# Patient Record
Sex: Male | Born: 1975 | Race: White | Hispanic: No | Marital: Married | Smoking: Former smoker
Health system: Southern US, Community
[De-identification: ages and names within clinical notes are randomized; demographics above are authoritative.]

## PROBLEM LIST (undated history)

## (undated) DIAGNOSIS — I5022 Chronic systolic (congestive) heart failure: Secondary | ICD-10-CM

## (undated) DIAGNOSIS — E785 Hyperlipidemia, unspecified: Secondary | ICD-10-CM

## (undated) DIAGNOSIS — F32A Depression, unspecified: Secondary | ICD-10-CM

## (undated) DIAGNOSIS — I509 Heart failure, unspecified: Secondary | ICD-10-CM

## (undated) DIAGNOSIS — I4891 Unspecified atrial fibrillation: Secondary | ICD-10-CM

## (undated) DIAGNOSIS — M109 Gout, unspecified: Secondary | ICD-10-CM

## (undated) DIAGNOSIS — I499 Cardiac arrhythmia, unspecified: Secondary | ICD-10-CM

## (undated) DIAGNOSIS — F419 Anxiety disorder, unspecified: Secondary | ICD-10-CM

## (undated) DIAGNOSIS — I10 Essential (primary) hypertension: Secondary | ICD-10-CM

## (undated) DIAGNOSIS — R7303 Prediabetes: Secondary | ICD-10-CM

## (undated) HISTORY — PX: SEPTOPLASTY: SUR1290

## (undated) HISTORY — DX: Essential (primary) hypertension: I10

## (undated) HISTORY — PX: CARDIAC CATHETERIZATION: SHX172

## (undated) HISTORY — PX: OTHER SURGICAL HISTORY: SHX169

## (undated) HISTORY — DX: Hyperlipidemia, unspecified: E78.5

---

## 2020-01-31 ENCOUNTER — Other Ambulatory Visit: Payer: Self-pay | Admitting: General Surgery

## 2020-01-31 ENCOUNTER — Other Ambulatory Visit (HOSPITAL_COMMUNITY): Payer: Self-pay | Admitting: General Surgery

## 2020-02-03 ENCOUNTER — Other Ambulatory Visit (HOSPITAL_COMMUNITY): Payer: Self-pay | Admitting: General Surgery

## 2020-02-19 ENCOUNTER — Other Ambulatory Visit: Payer: Self-pay

## 2020-02-19 ENCOUNTER — Ambulatory Visit (HOSPITAL_COMMUNITY)
Admission: RE | Admit: 2020-02-19 | Discharge: 2020-02-19 | Disposition: A | Discharge status: Home / Self Care | Payer: BC Managed Care – PPO | Source: Ambulatory Visit | Attending: General Surgery | Admitting: General Surgery

## 2020-02-24 ENCOUNTER — Other Ambulatory Visit: Payer: Self-pay

## 2020-02-24 ENCOUNTER — Encounter: Payer: Self-pay | Admitting: Skilled Nursing Facility1

## 2020-02-24 ENCOUNTER — Encounter: Payer: BC Managed Care – PPO | Attending: General Surgery | Admitting: Skilled Nursing Facility1

## 2020-02-24 DIAGNOSIS — E669 Obesity, unspecified: Secondary | ICD-10-CM | POA: Diagnosis not present

## 2020-02-24 NOTE — Progress Notes (Signed)
Nutrition Assessment for Bariatric Surgery Medical Nutrition Therapy Appt Start Time: 11:05 End Time: 12:05  Patient was seen on 02/24/2020 for Pre-Operative Nutrition Assessment. Letter of approval faxed to [facility] bariatric surgery program coordinator on 02/24/2020  Referral stated Supervised Weight Loss (SWL) visits needed: 0  Planned surgery: Sleeve Gastrectomy  Pt expectation of surgery: to control blood pressure  Pt expectation of dietitian: to help guide   Dietitian Clearance: Pt seems appropriately prepared for surgery understanding the change neccessary to be successful post surgery   NUTRITION ASSESSMENT   Anthropometrics  Start weight at [facility]: 316 lbs (date: 02/24/2020)  Height: 73 in BMI: 41.76 kg/m2     Clinical  Medical hx: hypertension  Medications: see list Labs:  Notable signs/symptoms: N/A Any previous deficiencies? No  Micronutrient Nutrition Focused Physical Exam: Hair: No issues observed Eyes: No issues observed Mouth: No issues observed Neck: No issues observed Nails: No issues observed Skin: No issues observed  Lifestyle & Dietary Hx  Pt states he does not really care about what weight number he gets too he really cares more about getting his blood pressure under control recognizing how important health is especially compared to weight. Pt states a mass was found in his stomach so his blood pressure has been up due to worry stating he checks his blood pressure daily. Pt states he has never noticed any quicker to be full feeling but does have some relfux.  Pt states he knows he is a stress eater.  Pt states he has been doing assisted stretching which he feels has helped.  Pt states he does have a therapist he works with.  Pt states he sleeps okay but thinks maybe he has apnea.   24-Hr Dietary Recall First Meal: skipped or fast food Snack: granola bar or grapes  Second Meal: sandwich and chips or leftover fast food Snack:  chips Third Meal: pasta or pizza Snack: candy Beverages: coffee, beer, diet soda, water   Estimated Energy Needs Calories: 1800   NUTRITION DIAGNOSIS  Overweight/obesity ([place]-3.3) related to past poor dietary habits and physical inactivity as evidenced by patient w/ planned sleeve gastrectomy surgery following dietary guidelines for continued weight loss.    NUTRITION INTERVENTION  Nutrition counseling (C-1) and education (E-2) to facilitate bariatric surgery goals.   Pre-Op Goals Reviewed with the Patient  Track food and beverage intake (pen and paper, MyFitness Pal, Baritastic app, etc.)  Make healthy food choices while monitoring portion sizes  Consume 3 meals per day or try to eat every 3-5 hours  Avoid concentrated sugars and fried foods  Keep sugar & fat in the single digits per serving on food labels  Practice CHEWING your food (aim for applesauce consistency)  Practice not drinking 15 minutes before, during, and 30 minutes after each meal and snack  Avoid all carbonated beverages (ex: soda, sparkling beverages)   Limit caffeinated beverages (ex: coffee, tea, energy drinks)  Avoid all sugar-sweetened beverages (ex: regular soda, sports drinks)   Avoid alcohol   Aim for 64-100 ounces of FLUID daily (with at least half of fluid intake being plain water)   Aim for at least 60-80 grams of PROTEIN daily  Look for a liquid protein source that contains ?15 g protein and ?5 g carbohydrate (ex: shakes, drinks, shots)  Make a list of non-food related activities  Physical activity is an important part of a healthy lifestyle so keep it moving! The goal is to reach 150 minutes of exercise per week,  including cardiovascular and weight baring activity.  *Goals that are bolded indicate the pt would like to start working towards these  Handouts Provided Include   Bariatric Surgery handouts (Nutrition Visits, Pre-Op Goals, Protein Shakes, Vitamins & Minerals)  Learning  Style & Readiness for Change Teaching method utilized: Visual & Auditory  Demonstrated degree of understanding via: Teach Back  Barriers to learning/adherence to lifestyle change: emotional eater      MONITORING & EVALUATION Dietary intake, weekly physical activity, body weight, and pre-op goals reached at next nutrition visit.    Next Steps  Patient is to follow up at [facility] for Pre-Op Class >2 weeks before surgery for further nutrition education.

## 2020-02-25 ENCOUNTER — Other Ambulatory Visit: Payer: Self-pay | Admitting: General Surgery

## 2020-03-10 ENCOUNTER — Ambulatory Visit
Admission: RE | Admit: 2020-03-10 | Discharge: 2020-03-10 | Disposition: A | Discharge status: Home / Self Care | Payer: BC Managed Care – PPO | Source: Ambulatory Visit | Attending: General Surgery | Admitting: General Surgery

## 2020-03-10 ENCOUNTER — Other Ambulatory Visit: Payer: Self-pay

## 2020-03-10 MED ORDER — IOPAMIDOL (ISOVUE-300) INJECTION 61%
100.0000 mL | Freq: Once | INTRAVENOUS | Status: AC | PRN
  Administered 2020-03-10: 100 mL via INTRAVENOUS

## 2020-04-25 ENCOUNTER — Other Ambulatory Visit: Payer: Self-pay

## 2020-04-25 ENCOUNTER — Emergency Department (HOSPITAL_COMMUNITY): Payer: BC Managed Care – PPO

## 2020-04-25 ENCOUNTER — Observation Stay (HOSPITAL_COMMUNITY)
Admission: EM | Admit: 2020-04-25 | Discharge: 2020-04-27 | Disposition: A | Discharge status: Home / Self Care | Payer: BC Managed Care – PPO | Attending: Family Medicine | Admitting: Family Medicine

## 2020-04-25 ENCOUNTER — Encounter (HOSPITAL_COMMUNITY): Payer: Self-pay | Admitting: Emergency Medicine

## 2020-04-25 DIAGNOSIS — I10 Essential (primary) hypertension: Secondary | ICD-10-CM | POA: Diagnosis not present

## 2020-04-25 DIAGNOSIS — M545 Low back pain, unspecified: Secondary | ICD-10-CM

## 2020-04-25 DIAGNOSIS — I4891 Unspecified atrial fibrillation: Secondary | ICD-10-CM | POA: Diagnosis present

## 2020-04-25 DIAGNOSIS — Z20822 Contact with and (suspected) exposure to covid-19: Secondary | ICD-10-CM | POA: Insufficient documentation

## 2020-04-25 DIAGNOSIS — F32A Depression, unspecified: Secondary | ICD-10-CM

## 2020-04-25 DIAGNOSIS — E785 Hyperlipidemia, unspecified: Secondary | ICD-10-CM

## 2020-04-25 DIAGNOSIS — F410 Panic disorder [episodic paroxysmal anxiety] without agoraphobia: Secondary | ICD-10-CM

## 2020-04-25 DIAGNOSIS — M109 Gout, unspecified: Secondary | ICD-10-CM

## 2020-04-25 DIAGNOSIS — R079 Chest pain, unspecified: Secondary | ICD-10-CM

## 2020-04-25 HISTORY — DX: Unspecified atrial fibrillation: I48.91

## 2020-04-25 HISTORY — DX: Gout, unspecified: M10.9

## 2020-04-25 LAB — CBC
HCT: 46.6 % (ref 39.0–52.0)
Hemoglobin: 16.5 g/dL (ref 13.0–17.0)
MCH: 29.8 pg (ref 26.0–34.0)
MCHC: 35.4 g/dL (ref 30.0–36.0)
MCV: 84.1 fL (ref 80.0–100.0)
Platelets: 293 10*3/uL (ref 150–400)
RBC: 5.54 MIL/uL (ref 4.22–5.81)
RDW: 12.4 % (ref 11.5–15.5)
WBC: 10.6 10*3/uL — ABNORMAL HIGH (ref 4.0–10.5)
nRBC: 0 % (ref 0.0–0.2)

## 2020-04-25 LAB — BASIC METABOLIC PANEL
Anion gap: 12 (ref 5–15)
BUN: 15 mg/dL (ref 6–20)
CO2: 24 mmol/L (ref 22–32)
Calcium: 9.5 mg/dL (ref 8.9–10.3)
Chloride: 102 mmol/L (ref 98–111)
Creatinine, Ser: 1.18 mg/dL (ref 0.61–1.24)
GFR, Estimated: >60 mL/min (ref >60)
Glucose, Bld: 129 mg/dL — ABNORMAL HIGH (ref 70–99)
Potassium: 3.5 mmol/L (ref 3.5–5.1)
Sodium: 138 mmol/L (ref 135–145)

## 2020-04-25 LAB — TROPONIN I (HIGH SENSITIVITY)
Troponin I (High Sensitivity): 68 ng/L — ABNORMAL HIGH (ref <18)
Troponin I (High Sensitivity): 69 ng/L — ABNORMAL HIGH (ref <18)

## 2020-04-25 NOTE — ED Triage Notes (Signed)
Pt reports intermittent SOB x 2 weeks.  Seen at [facility] last night and diagnosed with AFIB.  States he left AMA and didn't want to be admitted.  Denies chest pain at present but has had chest pain.

## 2020-04-26 ENCOUNTER — Emergency Department (HOSPITAL_COMMUNITY): Payer: BC Managed Care – PPO

## 2020-04-26 ENCOUNTER — Observation Stay (HOSPITAL_BASED_OUTPATIENT_CLINIC_OR_DEPARTMENT_OTHER): Payer: BC Managed Care – PPO

## 2020-04-26 ENCOUNTER — Encounter (HOSPITAL_COMMUNITY): Payer: Self-pay | Admitting: Internal Medicine

## 2020-04-26 DIAGNOSIS — F410 Panic disorder [episodic paroxysmal anxiety] without agoraphobia: Secondary | ICD-10-CM

## 2020-04-26 DIAGNOSIS — I4891 Unspecified atrial fibrillation: Secondary | ICD-10-CM

## 2020-04-26 DIAGNOSIS — I5021 Acute systolic (congestive) heart failure: Secondary | ICD-10-CM | POA: Diagnosis not present

## 2020-04-26 DIAGNOSIS — E785 Hyperlipidemia, unspecified: Secondary | ICD-10-CM

## 2020-04-26 DIAGNOSIS — M109 Gout, unspecified: Secondary | ICD-10-CM

## 2020-04-26 DIAGNOSIS — I10 Essential (primary) hypertension: Secondary | ICD-10-CM | POA: Diagnosis not present

## 2020-04-26 DIAGNOSIS — F32A Depression, unspecified: Secondary | ICD-10-CM

## 2020-04-26 DIAGNOSIS — M545 Low back pain, unspecified: Secondary | ICD-10-CM

## 2020-04-26 LAB — ECHOCARDIOGRAM COMPLETE
Area-P 1/2: 5.31 cm2
Calc EF: 37.5 %
Height: 73 in
S' Lateral: 4.2 cm
Single Plane A2C EF: 26.2 %
Single Plane A4C EF: 39.9 %
Weight: 5040 oz

## 2020-04-26 LAB — RESP PANEL BY RT-PCR (FLU A&B, COVID) ARPGX2
Influenza A by PCR: NEGATIVE
Influenza B by PCR: NEGATIVE
SARS Coronavirus 2 by RT PCR: NEGATIVE

## 2020-04-26 LAB — CBC
HCT: 44.7 % (ref 39.0–52.0)
Hemoglobin: 15 g/dL (ref 13.0–17.0)
MCH: 29 pg (ref 26.0–34.0)
MCHC: 33.6 g/dL (ref 30.0–36.0)
MCV: 86.3 fL (ref 80.0–100.0)
Platelets: 249 10*3/uL (ref 150–400)
RBC: 5.18 MIL/uL (ref 4.22–5.81)
RDW: 12.4 % (ref 11.5–15.5)
WBC: 8.6 10*3/uL (ref 4.0–10.5)
nRBC: 0 % (ref 0.0–0.2)

## 2020-04-26 LAB — BASIC METABOLIC PANEL
Anion gap: 11 (ref 5–15)
BUN: 12 mg/dL (ref 6–20)
CO2: 23 mmol/L (ref 22–32)
Calcium: 8.7 mg/dL — ABNORMAL LOW (ref 8.9–10.3)
Chloride: 103 mmol/L (ref 98–111)
Creatinine, Ser: 1.07 mg/dL (ref 0.61–1.24)
GFR, Estimated: >60 mL/min (ref >60)
Glucose, Bld: 180 mg/dL — ABNORMAL HIGH (ref 70–99)
Potassium: 3.2 mmol/L — ABNORMAL LOW (ref 3.5–5.1)
Sodium: 137 mmol/L (ref 135–145)

## 2020-04-26 LAB — TSH: TSH: 3.48 u[IU]/mL (ref 0.350–4.500)

## 2020-04-26 LAB — HIV ANTIBODY (ROUTINE TESTING W REFLEX): HIV Screen 4th Generation wRfx: NONREACTIVE

## 2020-04-26 LAB — MAGNESIUM: Magnesium: 1.9 mg/dL (ref 1.7–2.4)

## 2020-04-26 MED ORDER — LOSARTAN POTASSIUM 50 MG PO TABS
100.0000 mg | ORAL_TABLET | Freq: Every day | ORAL | Status: DC
  Administered 2020-04-27: 100 mg via ORAL
  Filled 2020-04-26: qty 2

## 2020-04-26 MED ORDER — TRAMADOL HCL 50 MG PO TABS: 50.0000 mg | ORAL_TABLET | Freq: Two times a day (BID) | ORAL | Status: DC | PRN

## 2020-04-26 MED ORDER — ASPIRIN 81 MG PO CHEW
324.0000 mg | CHEWABLE_TABLET | Freq: Once | ORAL | Status: AC
  Administered 2020-04-26: 324 mg via ORAL
  Filled 2020-04-26: qty 4

## 2020-04-26 MED ORDER — ACETAMINOPHEN 325 MG PO TABS: 650.0000 mg | ORAL_TABLET | ORAL | Status: DC | PRN

## 2020-04-26 MED ORDER — CLONAZEPAM 0.5 MG PO TABS
0.5000 mg | ORAL_TABLET | Freq: Every day | ORAL | Status: DC | PRN
  Administered 2020-04-27: 0.5 mg via ORAL
  Filled 2020-04-26: qty 1

## 2020-04-26 MED ORDER — POTASSIUM CHLORIDE CRYS ER 20 MEQ PO TBCR
40.0000 meq | EXTENDED_RELEASE_TABLET | Freq: Two times a day (BID) | ORAL | Status: DC
  Administered 2020-04-26 – 2020-04-27 (×3): 40 meq via ORAL
  Filled 2020-04-26 (×3): qty 2

## 2020-04-26 MED ORDER — ONDANSETRON HCL 4 MG/2ML IJ SOLN: 4.0000 mg | Freq: Four times a day (QID) | INTRAMUSCULAR | Status: DC | PRN

## 2020-04-26 MED ORDER — RIVAROXABAN 20 MG PO TABS
20.0000 mg | ORAL_TABLET | Freq: Every day | ORAL | Status: DC
  Administered 2020-04-26 – 2020-04-27 (×2): 20 mg via ORAL
  Filled 2020-04-26 (×2): qty 1

## 2020-04-26 MED ORDER — PERFLUTREN LIPID MICROSPHERE
1.0000 mL | INTRAVENOUS | Status: AC | PRN
Start: 2020-04-26 — End: 2020-04-26
  Administered 2020-04-26: 2 mL via INTRAVENOUS
  Filled 2020-04-26: qty 10

## 2020-04-26 MED ORDER — HYDROCHLOROTHIAZIDE 25 MG PO TABS
25.0000 mg | ORAL_TABLET | Freq: Every day | ORAL | Status: DC
  Administered 2020-04-26 – 2020-04-27 (×2): 25 mg via ORAL
  Filled 2020-04-26 (×2): qty 1

## 2020-04-26 MED ORDER — AMLODIPINE BESYLATE 10 MG PO TABS
10.0000 mg | ORAL_TABLET | Freq: Every day | ORAL | Status: DC
  Administered 2020-04-26 – 2020-04-27 (×2): 10 mg via ORAL
  Filled 2020-04-26 (×2): qty 1

## 2020-04-26 MED ORDER — METOPROLOL TARTRATE 25 MG PO TABS
25.0000 mg | ORAL_TABLET | Freq: Three times a day (TID) | ORAL | Status: DC
  Administered 2020-04-26 – 2020-04-27 (×2): 25 mg via ORAL
  Filled 2020-04-26 (×2): qty 1

## 2020-04-26 MED ORDER — ALLOPURINOL 100 MG PO TABS
100.0000 mg | ORAL_TABLET | Freq: Every day | ORAL | Status: DC
  Administered 2020-04-26 – 2020-04-27 (×2): 100 mg via ORAL
  Filled 2020-04-26 (×3): qty 1

## 2020-04-26 MED ORDER — DILTIAZEM HCL-DEXTROSE 125-5 MG/125ML-% IV SOLN (PREMIX)
5.0000 mg/h | INTRAVENOUS | Status: DC
  Administered 2020-04-26: 5 mg/h via INTRAVENOUS
  Filled 2020-04-26: qty 125

## 2020-04-26 MED ORDER — NITROGLYCERIN 0.4 MG SL SUBL: 0.4000 mg | SUBLINGUAL_TABLET | SUBLINGUAL | Status: DC | PRN

## 2020-04-26 MED ORDER — FENOFIBRATE 160 MG PO TABS
160.0000 mg | ORAL_TABLET | Freq: Every day | ORAL | Status: DC
  Administered 2020-04-26 – 2020-04-27 (×2): 160 mg via ORAL
  Filled 2020-04-26 (×3): qty 1

## 2020-04-26 MED ORDER — BUPROPION HCL ER (XL) 150 MG PO TB24
300.0000 mg | ORAL_TABLET | Freq: Every day | ORAL | Status: DC
  Administered 2020-04-26 – 2020-04-27 (×2): 300 mg via ORAL
  Filled 2020-04-26 (×2): qty 2

## 2020-04-26 NOTE — ED Provider Notes (Signed)
TIME SEEN: 12:12 AM  CHIEF COMPLAINT: Chest pain, shortness of breath, fatigue  HPI: Patient is a 44 year old male with history of hypertension, hyperlipidemia, obesity who presents to the emergency department with 2 weeks of shortness of breath, chest tightness intermittently and fatigue.  Was seen in urgent care yesterday and was diagnosed with atrial fibrillation with rapid rate and was sent to the [facility] emergency department.  They recommended admission to the hospital and started him on diltiazem infusion but he left AGAINST MEDICAL ADVICE.  They prescribed him diltiazem and Xarelto.  He last took these medications at 1 PM today.  Reports his heart rate has been better controlled but he is still having chest tightness and shortness of breath intermittently.  On review of records at [facility], patient had minimally elevated troponins.  His D-dimer was negative.  ROS: See HPI Constitutional: no fever  Eyes: no drainage  ENT: no runny nose   Cardiovascular:   chest pain  Resp: SOB  GI: no vomiting GU: no dysuria Integumentary: no rash  Allergy: no hives  Musculoskeletal: no leg swelling  Neurological: no slurred speech ROS otherwise negative  PAST MEDICAL HISTORY/PAST SURGICAL HISTORY:  Past Medical History:  Diagnosis Date  . Atrial fibrillation (HCC)   . Hyperlipidemia   . Hypertension     MEDICATIONS:  Prior to Admission medications   Not on File    ALLERGIES:  Not on File  SOCIAL HISTORY:  Social History   Tobacco Use  . Smoking status: Never Smoker  . Smokeless tobacco: Never Used  Substance Use Topics  . Alcohol use: Not Currently    FAMILY HISTORY: No family history on file.  EXAM: BP (!) 127/92 (BP Location: Right Arm)   Pulse (!) 54   Temp 98.2 F (36.8 C) (Oral)   Resp 19   Ht 6\' 1"  (1.854 m)   Wt (!) 142.9 kg   SpO2 98%   BMI 41.56 kg/m  CONSTITUTIONAL: Alert and oriented and responds appropriately to questions. Well-appearing;  well-nourished HEAD: Normocephalic EYES: Conjunctivae clear, pupils appear equal, EOM appear intact ENT: normal nose; moist mucous membranes NECK: Supple, normal ROM CARD: Irregularly irregular and tachycardic; S1 and S2 appreciated; no murmurs, no clicks, no rubs, no gallops RESP: Normal chest excursion without splinting or tachypnea; breath sounds clear and equal bilaterally; no wheezes, no rhonchi, no rales, no hypoxia or respiratory distress, speaking full sentences ABD/GI: Normal bowel sounds; non-distended; soft, non-tender, no rebound, no guarding, no peritoneal signs, no hepatosplenomegaly BACK:  The back appears normal EXT: Normal ROM in all joints; no deformity noted, no edema; no cyanosis, no calf tenderness or calf swelling SKIN: Normal color for age and race; warm; no rash on exposed skin NEURO: Moves all extremities equally PSYCH: The patient's mood and manner are appropriate.   MEDICAL DECISION MAKING: Patient here with A. fib with RVR.  Rates in the 120s to 130s on my examination at rest.  Minimally elevated troponins likely rate related.  Chest x-ray clear.  D-dimer yesterday at [facility] was normal.  Will start diltiazem infusion and admit.  He has not been on anticoagulation until yesterday and his chads vas 2 score is 1.  At this time I do not feel he is a candidate for emergent cardioversion.  ED PROGRESS: 12:52 AM Discussed patient's case with hospitalist, Dr. [facility].  I have recommended admission and patient (and family if present) agree with this plan. Admitting physician will place admission orders.   I reviewed all  nursing notes, vitals, pertinent previous records and reviewed/interpreted all EKGs, lab and urine results, imaging (as available).     EKG Interpretation  Date/Time:  Saturday April 25 2020 16:37:19 EST Ventricular Rate:  115 PR Interval:    QRS Duration: 96 QT Interval:  338 QTC Calculation: 467 R Axis:   78 Text Interpretation: Atrial  fibrillation with rapid ventricular response with premature ventricular or aberrantly conducted complexes Possible Anterior infarct , age undetermined Abnormal ECG Confirmed by Marianna Fuss ([id number]) on 04/25/2020 6:14:39 PM       CRITICAL CARE Performed by: Baxter Hire Coulton Schlink   Total critical care time: 55 minutes  Critical care time was exclusive of separately billable procedures and treating other patients.  Critical care was necessary to treat or prevent imminent or life-threatening deterioration.  Critical care was time spent personally by me on the following activities: development of treatment plan with patient and/or surrogate as well as nursing, discussions with consultants, evaluation of patient's response to treatment, examination of patient, obtaining history from patient or surrogate, ordering and performing treatments and interventions, ordering and review of laboratory studies, ordering and review of radiographic studies, pulse oximetry and re-evaluation of patient's condition.   Sherwood Castilla was evaluated in Emergency Department on 04/26/2020 for the symptoms described in the history of present illness. He was evaluated in the context of the global COVID-19 pandemic, which necessitated consideration that the patient might be at risk for infection with the SARS-CoV-2 virus that causes COVID-19. Institutional protocols and algorithms that pertain to the evaluation of patients at risk for COVID-19 are in a state of rapid change based on information released by regulatory bodies including the CDC and federal and state organizations. These policies and algorithms were followed during the patient's care in the ED.      Gaddiel Cullens, Layla Maw, DO 04/26/20 [phone]

## 2020-04-26 NOTE — H&P (Addendum)
History and Physical   Alexander [name] BRA:[id number] DOB: 08/22/1975 DOA: 04/25/2020  PCP: Halford Chessman, MD   Patient coming from: Home  Chief Complaint: Shortness of breath, chest tightness  HPI: Alexander [name] is a 44 y.o. male with medical history significant of hypertension, hyperlipidemia, gout, obesity, depression, panic attacks who presents with 2 weeks of intermittent shortness of breath and chest tightness.  He states he has had intermittent shortness of breath, heart racing, fatigue, chest tightness for the past 2 weeks as above.  He does not always have each of these symptoms but he has had them all at different times.  He does have episodes where he feels symptom-free in between.  Patient was seen in urgent care for this yesterday and was found to be in A. fib with RVR and was transferred to [facility] the ED to be evaluated.  They determined he would benefit from an admission after being placed on a diltiazem drip he refused admission at that time due to personal matters.  He states he has felt a little bit better since taking the diltiazem and Xarelto that he was discharged on. He presented today for admission for his known A. fib with RVR.  ED Course: Vital signs in the ED significant for heart rate initially in the 130s, blood pressure in the 100s to 160 systolic.  Lab work-up showed normal BMP, CBC with mild leukocytosis of 10.5.  Troponin 68 and then 69 on repeat.  Respiratory panel for flu and Covid pending.  Chest x-ray showed no acute abnormality.  Patient started on dill drip and we have been consulted for admission.  Review of Systems: As per HPI otherwise all other systems reviewed and are negative.  Past Medical History:  Diagnosis Date  . Atrial fibrillation (HCC)   . Gout   . Hyperlipidemia   . Hypertension     Past Surgical History:  Procedure Laterality Date  . SEPTOPLASTY      Social History  reports that he has never smoked. He has never used  smokeless tobacco. He reports previous alcohol use. He reports previous drug use.  Not on File  Family History  Problem Relation Age of Onset  . COPD Mother   Reviewed on admission  Prior to Admission medications   Not on File  Per chart review from [facility]: Fenofibrate 145 mg daily Tramadol 50 mg as needed for low back pain Topamax 2 5 mg twice daily for appetite suppression Losartan 100 mg daily Hydrochlorothiazide 25 mg daily Amlodipine 10 mg daily Allopurinol 100 mg daily Aspirin 81 daily Wellbutrin 300 mg daily Klonopin 0.5 mg as needed for panic attacks Diltiazem Xarelto 20 mg daily   Physical Exam: Vitals:   04/26/20 0015 04/26/20 0030 04/26/20 0045 04/26/20 0100  BP: 117/81 113/87 108/73 (!) 110/91  Pulse: 93 (!) 101 99 (!) 102  Resp: (!) 24 (!) 21 (!) 21 18  Temp:    98 F (36.7 C)  TempSrc:    Oral  SpO2: 97% 96% 97% 94%  Weight:      Height:       Physical Exam Constitutional:      General: He is not in acute distress.    Appearance: Normal appearance. He is obese.  HENT:     Head: Normocephalic and atraumatic.     Mouth/Throat:     Mouth: Mucous membranes are moist.     Pharynx: Oropharynx is clear.  Eyes:     Extraocular Movements: Extraocular movements  intact.     Pupils: Pupils are equal, round, and reactive to light.  Cardiovascular:     Rate and Rhythm: Regular rhythm. Tachycardia present.     Pulses: Normal pulses.     Heart sounds: Normal heart sounds.  Pulmonary:     Effort: Pulmonary effort is normal. No respiratory distress.     Breath sounds: Normal breath sounds.  Abdominal:     General: Bowel sounds are normal. There is no distension.     Palpations: Abdomen is soft.     Tenderness: There is no abdominal tenderness.  Musculoskeletal:        General: No swelling or deformity.  Skin:    General: Skin is warm and dry.  Neurological:     General: No focal deficit present.     Mental Status: Mental status is at baseline.     Labs on Admission: I have personally reviewed following labs and imaging studies  CBC: Recent Labs  Lab 04/25/20 1653  WBC 10.6*  HGB 16.5  HCT 46.6  MCV 84.1  PLT 293    Basic Metabolic Panel: Recent Labs  Lab 04/25/20 1653  NA 138  K 3.5  CL 102  CO2 24  GLUCOSE 129*  BUN 15  CREATININE 1.18  CALCIUM 9.5    GFR: Estimated Creatinine Clearance: 120 mL/min (by C-G formula based on SCr of 1.18 mg/dL).  Liver Function Tests: No results for input(s): AST, ALT, ALKPHOS, BILITOT, PROT, ALBUMIN in the last 168 hours.  Urine analysis: No results found for: COLORURINE, APPEARANCEUR, LABSPEC, PHURINE, GLUCOSEU, HGBUR, BILIRUBINUR, KETONESUR, PROTEINUR, UROBILINOGEN, NITRITE, LEUKOCYTESUR  Radiological Exams on Admission: DG Chest 2 View  Result Date: 04/25/2020 CLINICAL DATA:  Chest discomfort EXAM: CHEST - 2 VIEW COMPARISON:  February 19, 2020. FINDINGS: The cardiomediastinal silhouette is unchanged in contour. No pleural effusion. No pneumothorax. No acute pleuroparenchymal abnormality. Visualized abdomen is unremarkable. Multilevel degenerative changes of the thoracic spine. IMPRESSION: No acute cardiopulmonary abnormality. Electronically Signed   By: Meda Klinefelter MD   On: 04/25/2020 17:08    EKG: Independently reviewed.  Atrial fibrillation with RVR, rate 115, PVCs.  Assessment/Plan Principal Problem:   Atrial fibrillation with RVR (HCC) Active Problems:   HLD (hyperlipidemia)   HTN (hypertension)   Depression   Gout   Panic attacks   Low back pain  Atrial fibrillation with RVR > New onset atrial fibrillation, diagnosed yesterday, refused admission at outside hospital due to personal matters.  Now presenting for admission > Heart rate initially in the 110s to 130s in ED, now placed on adult drip > CHA2DS2-VASc of 1.  Has taken 1 day of his Xarelto. > Troponin flat at 68, 69 (also similar to results awake), suspect demand ischemia - Continue diltiazem  drip, convert when controlled hopefully later this morning - Echocardiogram - Continue Xarelto  Hypertension - Continue home amlodipine, hydrochlorothiazide - Hold losartan 100mg  as blood pressure is lower on dilt drip  Hyperlipidemia - Continue home fenofibrate  Gout - Continue home allopurinol  Depression  Panic attacks  - Continue home Wellbutrin, - Continue home as needed Klonopin  Back pain - Continue home as needed tramadol  DVT prophylaxis: Xarelto Code Status:   Full  Family Communication:  None on admission Disposition Plan:   Patient is from:  Home  Anticipated DC to:  Home  Anticipated DC date:  1 to 2 days  Anticipated DC barriers: None  Consults called:  None Admission status:  Observation, progressive  Severity of Illness: The appropriate patient status for this patient is OBSERVATION. Observation status is judged to be reasonable and necessary in order to provide the required intensity of service to ensure the patient's safety. The patient's presenting symptoms, physical exam findings, and initial radiographic and laboratory data in the context of their medical condition is felt to place them at decreased risk for further clinical deterioration. Furthermore, it is anticipated that the patient will be medically stable for discharge from the hospital within 2 midnights of admission. The following factors support the patient status of observation.   " The patient's presenting symptoms include shortness of breath, intermittent chest tightness. " The physical exam findings include tachycardia, obesity. " The initial radiographic and laboratory data are EKG consistent with A. fib with RVR.  Labs stable mild leukocytosis to 10.5 likely reactive.  Troponins flat at 68 and 69.      Synetta Fail MD [facility]  How to contact the [facility] Attending or Consulting provider 7A - 7P or covering provider during after hours 7P -7A, for this patient?   1. Check  the care team in [facility] and look for a) attending/consulting [facility] provider listed and b) the [facility] team listed 2. Log into [email or web address] and use [place]'s universal password to access. If you do not have the password, please contact the hospital operator. 3. Locate the [facility] provider you are looking for under [facility] and page to a number that you can be directly reached. 4. If you still have difficulty reaching the provider, please page the [facility] (Director on Call) for the Hospitalists listed on amion for assistance.  04/26/2020, 1:27 AM

## 2020-04-26 NOTE — ED Notes (Signed)
Attempted to call report to 3E24 x 2; floor says there is no nurse to receive patient and they will call back; charge nurse Docia Chuck, RN aware.

## 2020-04-26 NOTE — ED Notes (Signed)
Pt admitted to 3E24; report called to Matthew, RN. 

## 2020-04-26 NOTE — Progress Notes (Signed)
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 Cardiology who will graciiously see  Lalla Brothers, MD [facility] 1:18 PM

## 2020-04-26 NOTE — Consult Note (Signed)
Cardiology Consultation:   Patient ID: Alexander [name] MRN: [id number]; DOB: [birth year]  Admit date: 04/25/2020 Date of Consult: 04/26/2020  Primary Care Provider: Halford Chessmanartwright, Sarah, MD [facility] Cardiologist: No primary care provider on file.  [facility] Electrophysiologist:  Lanier PrudeAMERON T Chessa Barrasso, MD    Patient Profile:   Alexander [name] is a 44 y.o. male with a hx of HTN, HLD, adjustment disorder, anxiety and morbid obesity who is being seen today for the evaluation of atrial fibrillation and new diagnosis of acute systolic heart failure at the request of Dr Mahala MenghiniSamtani.  History of Present Illness:   Mr. Allen DerryRudock presented to the ER this morning after presenting to urgent care yesterday complaining of 2 weeks of SOB, chest tightness and fatigue. [facility] They recommended he be admitted and started him on a diltiazem gtt. He ultimately left AMA. They prescribed him dilt and rivaroxaban. He presented today with persistent symptoms and has been admitted.   He tells me he feels palpitations with his atrial fibrillation. What worried him the most recently was the new onset shortness of breath. He tells me he noticed some dyspnea while talking, with minimal exertion. No chest pain. No syncope/presyncope. No edema. No orthopnea. No PND.   Past Medical History:  Diagnosis Date  . Atrial fibrillation (HCC)   . Gout   . Hyperlipidemia   . Hypertension     Past Surgical History:  Procedure Laterality Date  . SEPTOPLASTY       Home Medications:  Prior to Admission medications   Medication Sig Start Date End Date Taking? Authorizing Provider  allopurinol (ZYLOPRIM) 100 MG tablet Take 100 mg by mouth daily. 02/13/20  Yes [provider]  amLODipine (NORVASC) 10 MG tablet Take 10 mg by mouth daily. 02/13/20  Yes [provider]  buPROPion (WELLBUTRIN XL) 300 MG 24 hr tablet Take 300 mg by mouth daily. 02/13/20  Yes [provider]  clonazePAM (KLONOPIN) 0.5 MG tablet Take 0.5 mg by mouth 2 (two) times daily as needed for anxiety. 11/27/19  Yes [provider]  colchicine 0.6 MG tablet Take 0.6 mg by mouth 2 (two) times daily as needed (gout). 02/28/20  Yes [provider]  diltiazem (CARDIZEM CD) 300 MG 24 hr capsule Take 300 mg by mouth daily. 04/25/20  Yes [provider]  fenofibrate (TRICOR) 145 MG tablet Take 145 mg by mouth daily. 02/13/20  Yes [provider]  hydrochlorothiazide (HYDRODIURIL) 25 MG tablet Take 25 mg by mouth daily. 02/13/20  Yes [provider]  ibuprofen (ADVIL) 200 MG tablet Take 200 mg by mouth every 6 (six) hours as needed for moderate pain.   Yes [provider]  losartan (COZAAR) 100 MG tablet Take 100 mg by mouth daily. 02/13/20  Yes [provider]  topiramate (TOPAMAX) 25 MG tablet Take 25 mg by mouth daily. 02/13/20  Yes [provider]  traMADol (ULTRAM) 50 MG tablet Take 50 mg by mouth 3 (three) times daily as needed for moderate pain. 02/13/20  Yes [provider]  XARELTO 20 MG TABS tablet Take 20 mg by mouth daily. 04/25/20  Yes [provider]    Inpatient Medications: Scheduled Meds: . allopurinol  100 mg Oral Daily  . amLODipine  10 mg Oral Daily  . buPROPion  300 mg Oral Daily  . fenofibrate  160 mg Oral Daily  . hydrochlorothiazide  25 mg Oral Daily  . potassium chloride  40  mEq Oral BID  . rivaroxaban  20 mg Oral Q supper   Continuous Infusions: . diltiazem (CARDIZEM) infusion 5 mg/hr (04/26/20 0047)   PRN Meds: acetaminophen, clonazePAM, nitroGLYCERIN, ondansetron (ZOFRAN) IV, traMADol  Allergies:    Allergies  Allergen Reactions  . Albuterol Anaphylaxis  . Bee Venom Anaphylaxis  . Metaproterenol Anaphylaxis  . Other Anaphylaxis    DEET  . Amoxicillin-Pot Clavulanate Itching    OK with Amoxicillin  . Cephalexin Other (See Comments)    Chest tightness  .  Amoxicillin Itching and Rash    Itching all over      Social History:   Social History   Socioeconomic History  . Marital status: Divorced    Spouse name: Not on file  . Number of children: Not on file  . Years of education: Not on file  . Highest education level: Not on file  Occupational History  . Not on file  Tobacco Use  . Smoking status: Never Smoker  . Smokeless tobacco: Never Used  Substance and Sexual Activity  . Alcohol use: Not Currently  . Drug use: Not Currently  . Sexual activity: Not on file  Other Topics Concern  . Not on file  Social History Narrative  . Not on file   Social Determinants of Health   Financial Resource Strain: Not on file  Food Insecurity: Not on file  Transportation Needs: Not on file  Physical Activity: Not on file  Stress: Not on file  Social Connections: Not on file  Intimate Partner Violence: Not on file    Family History:    Family History  Problem Relation Age of Onset  . COPD Mother      ROS:  Please see the history of present illness.   All other ROS reviewed and negative.     Physical Exam/Data:   Vitals:   04/26/20 0330 04/26/20 0436 04/26/20 1100 04/26/20 1200  BP: 127/81 (!) 126/94 (!) 129/108 130/89  Pulse: (!) 52 73 89 93  Resp: 20 18 (!) 31 20  Temp:  98.1 F (36.7 C) 98.1 F (36.7 C) 98.4 F (36.9 C)  TempSrc:  Oral Oral Oral  SpO2: 93% 94% 95% 96%  Weight:      Height:        Intake/Output Summary (Last 24 hours) at 04/26/2020 1452 Last data filed at 04/26/2020 0500 Gross per 24 hour  Intake 148.74 ml  Output --  Net 148.74 ml   Last 3 Weights 04/26/2020 02/24/2020  Weight (lbs) 315 lb 316 lb 8 oz  Weight (kg) 142.883 kg 143.563 kg     Body mass index is 41.56 kg/m.   General:  Well nourished, well developed, in no acute distress. Morbidly obese. HEENT: normal Lymph: no adenopathy Neck: no JVD Endocrine:  No thryomegaly Vascular: No carotid bruits; FA pulses 2+ bilaterally without  bruits  Cardiac:  Tachycardic, irregularly irregular. No murmurs on exam. Lungs:  clear to auscultation bilaterally, no wheezing, rhonchi or rales  Abd: soft, nontender, no hepatomegaly  Ext: no edema Musculoskeletal:  No deformities, BUE and BLE strength normal and equal Skin: warm and dry  Neuro:  CNs 2-12 intact, no focal abnormalities noted Psych:  Normal affect   EKG:  The EKG was personally reviewed and demonstrates:  AF w/ RVR. Late R wave transition. Single PVC. Telemetry:  Telemetry was personally reviewed and demonstrates:  AF w/ RVR.  Relevant CV Studies:  04/26/2020 Echo personally reviewed LV moderately reduced function, 35% LV function  globally down RV mildly reduced function   Laboratory Data:  High Sensitivity Troponin:   Recent Labs  Lab 04/25/20 1653 04/25/20 2055  TROPONINIHS 68* 69*     Chemistry Recent Labs  Lab 04/25/20 1653 04/26/20 0340  NA 138 137  K 3.5 3.2*  CL 102 103  CO2 24 23  GLUCOSE 129* 180*  BUN 15 12  CREATININE 1.18 1.07  CALCIUM 9.5 8.7*  GFRNONAA >60 >60  ANIONGAP 12 11    No results for input(s): PROT, ALBUMIN, AST, ALT, ALKPHOS, BILITOT in the last 168 hours. Hematology Recent Labs  Lab 04/25/20 1653 04/26/20 0340  WBC 10.6* 8.6  RBC 5.54 5.18  HGB 16.5 15.0  HCT 46.6 44.7  MCV 84.1 86.3  MCH 29.8 29.0  MCHC 35.4 33.6  RDW 12.4 12.4  PLT 293 249   BNPNo results for input(s): BNP, PROBNP in the last 168 hours.  DDimer No results for input(s): DDIMER in the last 168 hours.   Radiology/Studies:  DG Chest 2 View  Result Date: 04/25/2020 CLINICAL DATA:  Chest discomfort EXAM: CHEST - 2 VIEW COMPARISON:  February 19, 2020. FINDINGS: The cardiomediastinal silhouette is unchanged in contour. No pleural effusion. No pneumothorax. No acute pleuroparenchymal abnormality. Visualized abdomen is unremarkable. Multilevel degenerative changes of the thoracic spine. IMPRESSION: No acute cardiopulmonary abnormality.  Electronically Signed   By: Meda Klinefelter MD   On: 04/25/2020 17:08   ECHOCARDIOGRAM COMPLETE  Result Date: 04/26/2020    ECHOCARDIOGRAM REPORT   Patient Name:   JOVANY DISANO Date of Exam: 04/26/2020 Medical Rec #:  [id number]     Height:       73.0 in Accession #:    [id number]    Weight:       315.0 lb Date of Birth:  Jul 22, 1975     BSA:          2.610 m Patient Age:    44 years      BP:           126/94 mmHg Patient Gender: M             HR:           95 bpm. Exam Location:  Inpatient Procedure: 2D Echo, Cardiac Doppler, Color Doppler and Intracardiac            Opacification Agent Indications:    I48.91* Unspeicified atrial fibrillation  History:        Patient has no prior history of Echocardiogram examinations.                 Abnormal ECG, Arrythmias:Atrial Fibrillation,                 Signs/Symptoms:Dyspnea; Risk Factors:Hypertension and                 Dyslipidemia.  Sonographer:    Sheralyn Boatman RDCS Referring Phys: [id number] Cecille Po [facility]  Sonographer Comments: Patient is morbidly obese and Technically difficult study due to poor echo windows. Image acquisition challenging due to patient body habitus. IMPRESSIONS  1. Left ventricular ejection fraction, by estimation, is 35 to 40%. Left ventricular ejection fraction by 2D MOD biplane is 37.5 %. The left ventricle has moderately decreased function. The left ventricle demonstrates global hypokinesis. There is mild concentric left ventricular hypertrophy. Left ventricular diastolic function could not be evaluated.  2. Right ventricular systolic function is mildly reduced. The right ventricular size is mildly enlarged. There is normal pulmonary artery systolic pressure.  The estimated right ventricular systolic pressure is 34.8 mmHg.  3. The mitral valve is grossly normal. Mild mitral valve regurgitation. No evidence of mitral stenosis.  4. The aortic valve is tricuspid. Aortic valve regurgitation is not visualized. Mild aortic valve sclerosis is  present, with no evidence of aortic valve stenosis.  5. There is mild dilatation of the ascending aorta, measuring 39 mm.  6. The inferior vena cava is normal in size with <50% respiratory variability, suggesting right atrial pressure of 8 mmHg. FINDINGS  Left Ventricle: Left ventricular ejection fraction, by estimation, is 35 to 40%. Left ventricular ejection fraction by 2D MOD biplane is 37.5 %. The left ventricle has moderately decreased function. The left ventricle demonstrates global hypokinesis. Definity contrast agent was given IV to delineate the left ventricular endocardial borders. The left ventricular internal cavity size was normal in size. There is mild concentric left ventricular hypertrophy. Left ventricular diastolic function could not  be evaluated due to atrial fibrillation. Left ventricular diastolic function could not be evaluated. Right Ventricle: The right ventricular size is mildly enlarged. No increase in right ventricular wall thickness. Right ventricular systolic function is mildly reduced. There is normal pulmonary artery systolic pressure. The tricuspid regurgitant velocity  is 2.59 m/s, and with an assumed right atrial pressure of 8 mmHg, the estimated right ventricular systolic pressure is 34.8 mmHg. Left Atrium: Left atrial size was normal in size. Right Atrium: Right atrial size was normal in size. Pericardium: Trivial pericardial effusion is present. Mitral Valve: The mitral valve is grossly normal. Mild mitral valve regurgitation. No evidence of mitral valve stenosis. Tricuspid Valve: The tricuspid valve is grossly normal. Tricuspid valve regurgitation is trivial. No evidence of tricuspid stenosis. Aortic Valve: The aortic valve is tricuspid. Aortic valve regurgitation is not visualized. Mild aortic valve sclerosis is present, with no evidence of aortic valve stenosis. Pulmonic Valve: The pulmonic valve was grossly normal. Pulmonic valve regurgitation is not visualized. No evidence  of pulmonic stenosis. Aorta: The aortic root is normal in size and structure. There is mild dilatation of the ascending aorta, measuring 39 mm. Venous: The inferior vena cava is normal in size with less than 50% respiratory variability, suggesting right atrial pressure of 8 mmHg. IAS/Shunts: The atrial septum is grossly normal.  LEFT VENTRICLE PLAX 2D                        Biplane EF (MOD) LVIDd:         5.70 cm         LV Biplane EF:   Left LVIDs:         4.20 cm                          ventricular LV PW:         1.70 cm                          ejection LV IVS:        1.60 cm                          fraction by LVOT diam:     2.40 cm                          2D MOD LV SV:  93                               biplane is LV SV Index:   36                               37.5 %. LVOT Area:     4.52 cm  LV Volumes (MOD) LV vol d, MOD    92.9 ml A2C: LV vol d, MOD    165.5 ml A4C: LV vol s, MOD    68.6 ml A2C: LV vol s, MOD    99.4 ml A4C: LV SV MOD A2C:   24.3 ml LV SV MOD A4C:   165.5 ml LV SV MOD BP:    51.2 ml RIGHT VENTRICLE         IVC TAPSE (M-mode): 1.2 cm  IVC diam: 2.10 cm LEFT ATRIUM             Index       RIGHT ATRIUM           Index LA diam:        4.80 cm 1.84 cm/m  RA Area:     19.80 cm LA Vol (A2C):   74.6 ml 28.58 ml/m RA Volume:   59.20 ml  22.68 ml/m LA Vol (A4C):   85.9 ml 32.91 ml/m LA Biplane Vol: 83.4 ml 31.96 ml/m  AORTIC VALVE LVOT Vmax:   116.00 cm/s LVOT Vmean:  89.200 cm/s LVOT VTI:    0.206 m  AORTA Ao Root diam: 3.70 cm Ao Asc diam:  3.90 cm MITRAL VALVE                TRICUSPID VALVE MV Area (PHT): 5.31 cm     TR Peak grad:   26.8 mmHg MV Decel Time: 143 msec     TR Vmax:        259.00 cm/s MV E velocity: 120.00 cm/s                             SHUNTS                             Systemic VTI:  0.21 m                             Systemic Diam: 2.40 cm Lennie Odor MD Electronically signed by Lennie Odor MD Signature Date/Time: 04/26/2020/11:54:31 AM    Final       Assessment and Plan:   1. Acute Systolic Heart Failure, EF 35% NYHA II-III symptoms. I do not suspect ischemic heart disease as a cause of the reduced EF but I do think we should formally evaluate for coronary disease with an exercise stress. This can be done as an outpatient. - start home losartan  daily. May be able to transition to entresto as outpatient - start metoprolol tartrate  PO TID this evening - stop dilt gtt now given reduced EF - will plan on seeing him in 3 weeks to assess his HF symptoms and to see whether he is still in AF. If AF persists at the follow up appointment, will plan to perform DCCV. This was discussed with the patient today and he is in agreement.  After DCCV, he will need to continue anticoagulation uninterrupted for 1 month. This will need to be taken into consideration as he plans for his gastric bypass surgery.  2. Atrial fibrillation CHADSVASc of 2 for CHF and HTN Previously taking rivaroxaban for stroke ppx Rapid ventricular rates are likely a significant contributor to his reduced LV function. - cont rivaroxaban - metoprolol as above  3. HTN Significant HTN. On amlodipine, losartan, HCTZ. Weight loss will help with this.  4. Obesity Patient has gastric bypass surgery scheduled for 2022. We discussed link between obesity and AF during my visit with him today.  For questions or updates, please contact [facility] Please consult [email or web address] for contact info under    Signed, Lanier Prude, MD  04/26/2020 2:52 PM

## 2020-04-26 NOTE — ED Notes (Signed)
Pt admitted to 3E24; report called to Molli Hazard, RN.

## 2020-04-26 NOTE — Progress Notes (Signed)
  Echocardiogram 2D Echocardiogram has been performed.  Alexander [name] 04/26/2020, 9:25 AM

## 2020-04-27 DIAGNOSIS — I4891 Unspecified atrial fibrillation: Secondary | ICD-10-CM | POA: Diagnosis not present

## 2020-04-27 LAB — RENAL FUNCTION PANEL
Albumin: 4.1 g/dL (ref 3.5–5.0)
Anion gap: 11 (ref 5–15)
BUN: 11 mg/dL (ref 6–20)
CO2: 26 mmol/L (ref 22–32)
Calcium: 9.7 mg/dL (ref 8.9–10.3)
Chloride: 103 mmol/L (ref 98–111)
Creatinine, Ser: 1.15 mg/dL (ref 0.61–1.24)
GFR, Estimated: >60 mL/min (ref >60)
Glucose, Bld: 138 mg/dL — ABNORMAL HIGH (ref 70–99)
Phosphorus: 3.5 mg/dL (ref 2.5–4.6)
Potassium: 3.9 mmol/L (ref 3.5–5.1)
Sodium: 140 mmol/L (ref 135–145)

## 2020-04-27 MED ORDER — METOPROLOL TARTRATE 25 MG PO TABS: 37.5000 mg | ORAL_TABLET | Freq: Three times a day (TID) | ORAL | Status: DC

## 2020-04-27 MED ORDER — XARELTO 20 MG PO TABS: 20.0000 mg | ORAL_TABLET | Freq: Every day | ORAL | 3 refills | Status: DC

## 2020-04-27 MED ORDER — METOPROLOL TARTRATE 25 MG PO TABS
37.5000 mg | ORAL_TABLET | Freq: Three times a day (TID) | ORAL | Status: DC
  Administered 2020-04-27: 37.5 mg via ORAL
  Filled 2020-04-27: qty 1

## 2020-04-27 MED ORDER — METOPROLOL TARTRATE 50 MG PO TABS: 50.0000 mg | ORAL_TABLET | Freq: Three times a day (TID) | ORAL | 3 refills | Status: DC

## 2020-04-27 NOTE — TOC Benefit Eligibility Note (Signed)
Transition of Care [facility]) Benefit Eligibility Note    Patient Details  Name: Alexander [name] MRN: [id number] Date of Birth: 1975/06/06   Medication/Dose: Xarelto 20mg . daily 30 day supply  Covered?: Yes  Tier:  (?)  Prescription Coverage Preferred Pharmacy: [facility]  Spoke with Person/Company/Phone Number:: Kalpuma A. [employer] PH# [phone]  Co-Pay: $20.00  Prior Approval: No  Deductible:  (?)       [phone] Phone Number: 04/27/2020, 4:05 PM

## 2020-04-27 NOTE — Discharge Summary (Signed)
Physician Discharge Summary  Alexander [name] NVB:[id number] DOB: 1976-04-09 DOA: 04/25/2020  PCP: Molli Barrows, MD  Admit date: 04/25/2020 Discharge date: 04/27/2020  Time spent: 30 minutes  Recommendations for Outpatient Follow-up:  1. No medication changes discontinued HCTZ cut back other HTN and medications started metoprolol this admission discontinued Cardizem 300 2. Continue Xarelto needs CBC Chem-12 in about 1 week 3. Follow-up with A. fib clinic for possible cardioversion 4. Follow-up for outpatient bariatric surgery   Discharge Diagnoses:  Principal Problem:   Atrial fibrillation with RVR ([place]) Active Problems:   HLD (hyperlipidemia)   HTN (hypertension)   Depression   Gout   Panic attacks   Low back pain   Discharge Condition: Improved  Diet recommendation: Heart healthy  Filed Weights   04/26/20 0000 04/27/20 0416  Weight: (!) 142.9 kg (!) 142.2 kg    History of present illness:  44 year old male known history HTN HLD allergic rhinitis adjustment disorder and anxiety class III obesity Chest pain 07/2018 with possible inferior infarct Presented to [facility] urgent care on prescription arch-2 weeks prior known heavy alcohol food consumption became persistently short of breath subsequently  found to be in A. fib RVR but could not present to the emergency room  Placed on Cardizem GTT, currently at 7.5 He is comfortable, no cp, no arm pain no diaphor  He doesn't smoke Step-mom has no signif Card illness Works at a [employer] [sedentary]  EF is decreased 30-40%  Cardiology was consulted saw the patient in consult because of decreased EF felt he could follow-up as an outpatient for outpatient stress testing as he did not have chest pain troponins were negative He was changed because of his low EF from Cardizem to metoprolol and that was titrated to 50 3 times daily Some of his meds were discontinued as per below [facility] He was stabilized to discharge  home in a stable state been met maximal hospital benefit on discharge    Discharge Exam: Vitals:   04/27/20 1643 04/27/20 1645  BP:    Pulse: (!) 129 (!) 120  Resp:    Temp:    SpO2:      General: Awake alert coherent no distress Cardiovascular: S1-S2 slightly tachycardic Respiratory: Clear no added sound Abdomen soft nontender no rebound Neurologically intact moving all 4 limbs  Discharge Instructions   Discharge Instructions    Amb referral to AFIB Clinic   Complete by: As directed    Diet - low sodium heart healthy   Complete by: As directed    Discharge instructions   Complete by: As directed    Look carefully for her blood pressure medications as some of them unchanged-we have had to institute metoprolol 50 mg 3 times a day to control your heart rate-I would recommend to get a pulse ox machine which can also check your heart rate and when you take walks with that record to your strength Indication get a blood pressure cuff at your local pharmacy and check your blood pressure several times a day at least for the first week taking these medications You will need to follow-with the A. fib clinic Your nurse will obtain a Xarelto coupon if you do not have 1 to help you access this as it sometimes can be expensiveU please follow-up with A. fib clinic you may need a cardioversion   Increase activity slowly   Complete by: As directed      Allergies as of 04/27/2020      Reactions  Albuterol Anaphylaxis   Bee Venom Anaphylaxis   Metaproterenol Anaphylaxis   Other Anaphylaxis   DEET   Amoxicillin-pot Clavulanate Itching   OK with Amoxicillin   Cephalexin Other (See Comments)   Chest tightness   Amoxicillin Itching, Rash   Itching all over       Medication List    STOP taking these medications   amLODipine 10 MG tablet Commonly known as: NORVASC   diltiazem 300 MG 24 hr capsule Commonly known as: CARDIZEM CD   hydrochlorothiazide 25 MG tablet Commonly known  as: HYDRODIURIL   ibuprofen 200 MG tablet Commonly known as: ADVIL     TAKE these medications   allopurinol 100 MG tablet Commonly known as: ZYLOPRIM Take 100 mg by mouth daily.   buPROPion 300 MG 24 hr tablet Commonly known as: WELLBUTRIN XL Take 300 mg by mouth daily.   clonazePAM 0.5 MG tablet Commonly known as: KLONOPIN Take 0.5 mg by mouth 2 (two) times daily as needed for anxiety.   colchicine 0.6 MG tablet Take 0.6 mg by mouth 2 (two) times daily as needed (gout).   fenofibrate 145 MG tablet Commonly known as: TRICOR Take 145 mg by mouth daily.   losartan 100 MG tablet Commonly known as: COZAAR Take 100 mg by mouth daily.   metoprolol tartrate 50 MG tablet Commonly known as: LOPRESSOR Take 1 tablet (50 mg total) by mouth 3 (three) times daily.   topiramate 25 MG tablet Commonly known as: TOPAMAX Take 25 mg by mouth daily.   traMADol 50 MG tablet Commonly known as: ULTRAM Take 50 mg by mouth 3 (three) times daily as needed for moderate pain.   Xarelto 20 MG Tabs tablet Generic drug: rivaroxaban Take 20 mg by mouth daily.      Allergies  Allergen Reactions  . Albuterol Anaphylaxis  . Bee Venom Anaphylaxis  . Metaproterenol Anaphylaxis  . Other Anaphylaxis    DEET  . Amoxicillin-Pot Clavulanate Itching    OK with Amoxicillin  . Cephalexin Other (See Comments)    Chest tightness  . Amoxicillin Itching and Rash    Itching all over      Follow-up Information    Alexander Epley, MD Follow up.   Specialties: Cardiology, Radiology Why: 05/13/2020 @ 1:45PM, hospital follow up Contact information: [place] [address] [phone]                The results of significant diagnostics from this hospitalization (including imaging, microbiology, ancillary and laboratory) are listed below for reference.    Significant Diagnostic Studies: DG Chest 2 View  Result Date: 04/25/2020 CLINICAL DATA:  Chest discomfort  EXAM: CHEST - 2 VIEW COMPARISON:  February 19, 2020. FINDINGS: The cardiomediastinal silhouette is unchanged in contour. No pleural effusion. No pneumothorax. No acute pleuroparenchymal abnormality. Visualized abdomen is unremarkable. Multilevel degenerative changes of the thoracic spine. IMPRESSION: No acute cardiopulmonary abnormality. Electronically Signed   By: Valentino Saxon MD   On: 04/25/2020 17:08   ECHOCARDIOGRAM COMPLETE  Result Date: 04/26/2020    ECHOCARDIOGRAM REPORT   Patient Name:   Alexander [name] Date of Exam: 04/26/2020 Medical Rec #:  [id number]     Height:       73.0 in Accession #:    [id number]    Weight:       315.0 lb Date of Birth:  05/02/1976     BSA:          2.610 m Patient Age:  44 years      BP:           126/94 mmHg Patient Gender: M             HR:           95 bpm. Exam Location:  Inpatient Procedure: 2D Echo, Cardiac Doppler, Color Doppler and Intracardiac            Opacification Agent Indications:    I48.91* Unspeicified atrial fibrillation  History:        Patient has no prior history of Echocardiogram examinations.                 Abnormal ECG, Arrythmias:Atrial Fibrillation,                 Signs/Symptoms:Dyspnea; Risk Factors:Hypertension and                 Dyslipidemia.  Sonographer:    Roseanna Rainbow RDCS Referring Phys: [id number] Candace Gallus [facility]  Sonographer Comments: Patient is morbidly obese and Technically difficult study due to poor echo windows. Image acquisition challenging due to patient body habitus. IMPRESSIONS  1. Left ventricular ejection fraction, by estimation, is 35 to 40%. Left ventricular ejection fraction by 2D MOD biplane is 37.5 %. The left ventricle has moderately decreased function. The left ventricle demonstrates global hypokinesis. There is mild concentric left ventricular hypertrophy. Left ventricular diastolic function could not be evaluated.  2. Right ventricular systolic function is mildly reduced. The right ventricular size is mildly  enlarged. There is normal pulmonary artery systolic pressure. The estimated right ventricular systolic pressure is 58.8 mmHg.  3. The mitral valve is grossly normal. Mild mitral valve regurgitation. No evidence of mitral stenosis.  4. The aortic valve is tricuspid. Aortic valve regurgitation is not visualized. Mild aortic valve sclerosis is present, with no evidence of aortic valve stenosis.  5. There is mild dilatation of the ascending aorta, measuring 39 mm.  6. The inferior vena cava is normal in size with <50% respiratory variability, suggesting right atrial pressure of 8 mmHg. FINDINGS  Left Ventricle: Left ventricular ejection fraction, by estimation, is 35 to 40%. Left ventricular ejection fraction by 2D MOD biplane is 37.5 %. The left ventricle has moderately decreased function. The left ventricle demonstrates global hypokinesis. Definity contrast agent was given IV to delineate the left ventricular endocardial borders. The left ventricular internal cavity size was normal in size. There is mild concentric left ventricular hypertrophy. Left ventricular diastolic function could not  be evaluated due to atrial fibrillation. Left ventricular diastolic function could not be evaluated. Right Ventricle: The right ventricular size is mildly enlarged. No increase in right ventricular wall thickness. Right ventricular systolic function is mildly reduced. There is normal pulmonary artery systolic pressure. The tricuspid regurgitant velocity  is 2.59 m/s, and with an assumed right atrial pressure of 8 mmHg, the estimated right ventricular systolic pressure is 32.5 mmHg. Left Atrium: Left atrial size was normal in size. Right Atrium: Right atrial size was normal in size. Pericardium: Trivial pericardial effusion is present. Mitral Valve: The mitral valve is grossly normal. Mild mitral valve regurgitation. No evidence of mitral valve stenosis. Tricuspid Valve: The tricuspid valve is grossly normal. Tricuspid valve  regurgitation is trivial. No evidence of tricuspid stenosis. Aortic Valve: The aortic valve is tricuspid. Aortic valve regurgitation is not visualized. Mild aortic valve sclerosis is present, with no evidence of aortic valve stenosis. Pulmonic Valve: The pulmonic valve was grossly normal. Pulmonic valve regurgitation  is not visualized. No evidence of pulmonic stenosis. Aorta: The aortic root is normal in size and structure. There is mild dilatation of the ascending aorta, measuring 39 mm. Venous: The inferior vena cava is normal in size with less than 50% respiratory variability, suggesting right atrial pressure of 8 mmHg. IAS/Shunts: The atrial septum is grossly normal.  LEFT VENTRICLE PLAX 2D                        Biplane EF (MOD) LVIDd:         5.70 cm         LV Biplane EF:   Left LVIDs:         4.20 cm                          ventricular LV PW:         1.70 cm                          ejection LV IVS:        1.60 cm                          fraction by LVOT diam:     2.40 cm                          2D MOD LV SV:         93                               biplane is LV SV Index:   36                               37.5 %. LVOT Area:     4.52 cm  LV Volumes (MOD) LV vol d, MOD    92.9 ml A2C: LV vol d, MOD    165.5 ml A4C: LV vol s, MOD    68.6 ml A2C: LV vol s, MOD    99.4 ml A4C: LV SV MOD A2C:   24.3 ml LV SV MOD A4C:   165.5 ml LV SV MOD BP:    51.2 ml RIGHT VENTRICLE         IVC TAPSE (M-mode): 1.2 cm  IVC diam: 2.10 cm LEFT ATRIUM             Index       RIGHT ATRIUM           Index LA diam:        4.80 cm 1.84 cm/m  RA Area:     19.80 cm LA Vol (A2C):   74.6 ml 28.58 ml/m RA Volume:   59.20 ml  22.68 ml/m LA Vol (A4C):   85.9 ml 32.91 ml/m LA Biplane Vol: 83.4 ml 31.96 ml/m  AORTIC VALVE LVOT Vmax:   116.00 cm/s LVOT Vmean:  89.200 cm/s LVOT VTI:    0.206 m  AORTA Ao Root diam: 3.70 cm Ao Asc diam:  3.90 cm MITRAL VALVE                TRICUSPID VALVE MV Area (PHT): 5.31 cm     TR Peak grad:   26.8  mmHg  MV Decel Time: 143 msec     TR Vmax:        259.00 cm/s MV E velocity: 120.00 cm/s                             SHUNTS                             Systemic VTI:  0.21 m                             Systemic Diam: 2.40 cm Eleonore Chiquito MD Electronically signed by Eleonore Chiquito MD Signature Date/Time: 04/26/2020/11:54:31 AM    Final     Microbiology: Recent Results (from the past 240 hour(s))  Resp Panel by RT-PCR (Flu A&B, Covid) Nasopharyngeal Swab     Status: None   Collection Time: 04/26/20 12:51 AM   Specimen: Nasopharyngeal Swab; Nasopharyngeal(NP) swabs in vial transport medium  Result Value Ref Range Status   SARS Coronavirus 2 by RT PCR NEGATIVE NEGATIVE Final    Comment: (NOTE) SARS-CoV-2 target nucleic acids are NOT DETECTED.  The SARS-CoV-2 RNA is generally detectable in upper respiratory specimens during the acute phase of infection. The lowest concentration of SARS-CoV-2 viral copies this assay can detect is 138 copies/mL. A negative result does not preclude SARS-Cov-2 infection and should not be used as the sole basis for treatment or other patient management decisions. A negative result may occur with  improper specimen collection/handling, submission of specimen other than nasopharyngeal swab, presence of viral mutation(s) within the areas targeted by this assay, and inadequate number of viral copies(<138 copies/mL). A negative result must be combined with clinical observations, patient history, and epidemiological information. The expected result is Negative.  Fact Sheet for Patients:  EntrepreneurPulse.com.au  Fact Sheet for Healthcare Providers:  IncredibleEmployment.be  This test is no t yet approved or cleared by the [place] FDA and  has been authorized for detection and/or diagnosis of SARS-CoV-2 by FDA under an Emergency Use Authorization (EUA). This EUA will remain  in effect (meaning this test can be used) for the  duration of the COVID-19 declaration under Section 564(b)(1) of the Act, 21 U.S.C.section 360bbb-3(b)(1), unless the authorization is terminated  or revoked sooner.       Influenza A by PCR NEGATIVE NEGATIVE Final   Influenza B by PCR NEGATIVE NEGATIVE Final    Comment: (NOTE) The Xpert Xpress SARS-CoV-2/FLU/RSV plus assay is intended as an aid in the diagnosis of influenza from Nasopharyngeal swab specimens and should not be used as a sole basis for treatment. Nasal washings and aspirates are unacceptable for Xpert Xpress SARS-CoV-2/FLU/RSV testing.  Fact Sheet for Patients: EntrepreneurPulse.com.au  Fact Sheet for Healthcare Providers: IncredibleEmployment.be  This test is not yet approved or cleared by the [place] FDA and has been authorized for detection and/or diagnosis of SARS-CoV-2 by FDA under an Emergency Use Authorization (EUA). This EUA will remain in effect (meaning this test can be used) for the duration of the COVID-19 declaration under Section 564(b)(1) of the Act, 21 U.S.C. section 360bbb-3(b)(1), unless the authorization is terminated or revoked.  Performed at [facility], [place] [address], [place], [place] [address]      Labs: Basic Metabolic Panel: Recent Labs  Lab 04/25/20 1653 04/26/20 0340 04/27/20 0751  NA 138 137 140  K 3.5 3.2*  3.9  CL 102 103 103  CO2 '24 23 26  ' GLUCOSE 129* 180* 138*  BUN '15 12 11  ' CREATININE 1.18 1.07 1.15  CALCIUM 9.5 8.7* 9.7  MG  --  1.9  --   PHOS  --   --  3.5   Liver Function Tests: Recent Labs  Lab 04/27/20 0751  ALBUMIN 4.1   No results for input(s): LIPASE, AMYLASE in the last 168 hours. No results for input(s): AMMONIA in the last 168 hours. CBC: Recent Labs  Lab 04/25/20 1653 04/26/20 0340  WBC 10.6* 8.6  HGB 16.5 15.0  HCT 46.6 44.7  MCV 84.1 86.3  PLT 293 249   Cardiac Enzymes: No results for input(s): CKTOTAL, CKMB, CKMBINDEX, TROPONINI in  the last 168 hours. BNP: BNP (last 3 results) No results for input(s): BNP in the last 8760 hours.  ProBNP (last 3 results) No results for input(s): PROBNP in the last 8760 hours.  CBG: No results for input(s): GLUCAP in the last 168 hours.     Signed:  Nita Sells MD   [facility] 04/27/2020, 4:51 PM

## 2020-04-27 NOTE — TOC Progression Note (Addendum)
Transition of Care [facility]) - Progression Note    Patient Details  Name: Alexander [name] MRN: [id number] Date of Birth: 10-14-1975  [facility]) CM/SW Contact  Leone Haven, RN Phone Number: 04/27/2020, 2:31 PM  Clinical Narrative:    NCM spoke with patient, he has insurance, he states he was on xarelto before he was admitted to [facility].  NCM gave him a 10.00 co pay xarelto card. He is indep, has no other needs.         Expected Discharge Plan and Services                                                 Social Determinants of Health (SDOH) Interventions    Readmission Risk Interventions No flowsheet data found.

## 2020-04-27 NOTE — Discharge Instructions (Signed)

## 2020-04-27 NOTE — Progress Notes (Signed)
Telemetry reviewed Remains in AFib, rates generally 80's-110, I do not see any bradycardia Continue BB, ARB and Xarelto Will arrange out patient EP follow up in a couple weeks with plans for DCCV after 3 weeks of uninterrupted anticoagulation.  Francis Dowse, PA-C

## 2020-05-13 ENCOUNTER — Inpatient Hospital Stay (HOSPITAL_COMMUNITY)
Admission: EM | Admit: 2020-05-13 | Discharge: 2020-05-18 | DRG: 308 | Disposition: A | Discharge status: Home / Self Care | Payer: BC Managed Care – PPO | Attending: Internal Medicine | Admitting: Internal Medicine

## 2020-05-13 ENCOUNTER — Other Ambulatory Visit: Payer: Self-pay

## 2020-05-13 ENCOUNTER — Encounter: Payer: Self-pay | Admitting: Cardiology

## 2020-05-13 ENCOUNTER — Encounter (HOSPITAL_COMMUNITY): Payer: Self-pay

## 2020-05-13 ENCOUNTER — Inpatient Hospital Stay (HOSPITAL_COMMUNITY): Payer: BC Managed Care – PPO

## 2020-05-13 ENCOUNTER — Ambulatory Visit: Payer: BC Managed Care – PPO | Admitting: Cardiology

## 2020-05-13 ENCOUNTER — Inpatient Hospital Stay: Payer: Self-pay

## 2020-05-13 VITALS — BP 126/90 | HR 139 | Ht 73.0 in | Wt 309.2 lb

## 2020-05-13 DIAGNOSIS — Z881 Allergy status to other antibiotic agents status: Secondary | ICD-10-CM | POA: Diagnosis not present

## 2020-05-13 DIAGNOSIS — I5021 Acute systolic (congestive) heart failure: Secondary | ICD-10-CM | POA: Diagnosis not present

## 2020-05-13 DIAGNOSIS — E785 Hyperlipidemia, unspecified: Secondary | ICD-10-CM | POA: Diagnosis present

## 2020-05-13 DIAGNOSIS — Z79899 Other long term (current) drug therapy: Secondary | ICD-10-CM

## 2020-05-13 DIAGNOSIS — E872 Acidosis: Secondary | ICD-10-CM | POA: Diagnosis present

## 2020-05-13 DIAGNOSIS — I509 Heart failure, unspecified: Secondary | ICD-10-CM

## 2020-05-13 DIAGNOSIS — I110 Hypertensive heart disease with heart failure: Secondary | ICD-10-CM | POA: Diagnosis present

## 2020-05-13 DIAGNOSIS — I5023 Acute on chronic systolic (congestive) heart failure: Secondary | ICD-10-CM | POA: Diagnosis present

## 2020-05-13 DIAGNOSIS — G4733 Obstructive sleep apnea (adult) (pediatric): Secondary | ICD-10-CM | POA: Diagnosis present

## 2020-05-13 DIAGNOSIS — I4819 Other persistent atrial fibrillation: Secondary | ICD-10-CM | POA: Diagnosis present

## 2020-05-13 DIAGNOSIS — Z888 Allergy status to other drugs, medicaments and biological substances status: Secondary | ICD-10-CM | POA: Diagnosis not present

## 2020-05-13 DIAGNOSIS — I4891 Unspecified atrial fibrillation: Secondary | ICD-10-CM | POA: Diagnosis present

## 2020-05-13 DIAGNOSIS — I480 Paroxysmal atrial fibrillation: Secondary | ICD-10-CM | POA: Diagnosis not present

## 2020-05-13 DIAGNOSIS — R570 Cardiogenic shock: Secondary | ICD-10-CM | POA: Diagnosis present

## 2020-05-13 DIAGNOSIS — Z6841 Body Mass Index (BMI) 40.0 and over, adult: Secondary | ICD-10-CM | POA: Diagnosis not present

## 2020-05-13 DIAGNOSIS — N179 Acute kidney failure, unspecified: Secondary | ICD-10-CM | POA: Diagnosis present

## 2020-05-13 DIAGNOSIS — F4322 Adjustment disorder with anxiety: Secondary | ICD-10-CM | POA: Diagnosis present

## 2020-05-13 DIAGNOSIS — Z7901 Long term (current) use of anticoagulants: Secondary | ICD-10-CM | POA: Diagnosis not present

## 2020-05-13 DIAGNOSIS — Z20822 Contact with and (suspected) exposure to covid-19: Secondary | ICD-10-CM | POA: Diagnosis present

## 2020-05-13 DIAGNOSIS — M109 Gout, unspecified: Secondary | ICD-10-CM | POA: Diagnosis present

## 2020-05-13 DIAGNOSIS — E876 Hypokalemia: Secondary | ICD-10-CM | POA: Diagnosis present

## 2020-05-13 DIAGNOSIS — Z452 Encounter for adjustment and management of vascular access device: Secondary | ICD-10-CM

## 2020-05-13 DIAGNOSIS — K7200 Acute and subacute hepatic failure without coma: Secondary | ICD-10-CM | POA: Diagnosis present

## 2020-05-13 DIAGNOSIS — Z91030 Bee allergy status: Secondary | ICD-10-CM

## 2020-05-13 LAB — CBC
HCT: 45.7 % (ref 39.0–52.0)
Hemoglobin: 15.3 g/dL (ref 13.0–17.0)
MCH: 29.5 pg (ref 26.0–34.0)
MCHC: 33.5 g/dL (ref 30.0–36.0)
MCV: 88.1 fL (ref 80.0–100.0)
Platelets: 289 10*3/uL (ref 150–400)
RBC: 5.19 MIL/uL (ref 4.22–5.81)
RDW: 13.8 % (ref 11.5–15.5)
WBC: 12.1 10*3/uL — ABNORMAL HIGH (ref 4.0–10.5)
nRBC: 0 % (ref 0.0–0.2)

## 2020-05-13 LAB — COOXEMETRY PANEL
Carboxyhemoglobin: 1.2 % (ref 0.5–1.5)
Methemoglobin: 0.7 % (ref 0.0–1.5)
O2 Saturation: 55.4 %
Total hemoglobin: 15.5 g/dL (ref 12.0–16.0)

## 2020-05-13 LAB — COMPREHENSIVE METABOLIC PANEL
ALT: 114 U/L — ABNORMAL HIGH (ref 0–44)
AST: 76 U/L — ABNORMAL HIGH (ref 15–41)
Albumin: 4.2 g/dL (ref 3.5–5.0)
Alkaline Phosphatase: 55 U/L (ref 38–126)
Anion gap: 12 (ref 5–15)
BUN: 20 mg/dL (ref 6–20)
CO2: 22 mmol/L (ref 22–32)
Calcium: 9.4 mg/dL (ref 8.9–10.3)
Chloride: 103 mmol/L (ref 98–111)
Creatinine, Ser: 1.64 mg/dL — ABNORMAL HIGH (ref 0.61–1.24)
GFR, Estimated: 53 mL/min — ABNORMAL LOW (ref >60)
Glucose, Bld: 116 mg/dL — ABNORMAL HIGH (ref 70–99)
Potassium: 4 mmol/L (ref 3.5–5.1)
Sodium: 137 mmol/L (ref 135–145)
Total Bilirubin: 1.4 mg/dL — ABNORMAL HIGH (ref 0.3–1.2)
Total Protein: 6.6 g/dL (ref 6.5–8.1)

## 2020-05-13 LAB — CBC WITH DIFFERENTIAL/PLATELET
Abs Immature Granulocytes: 0.06 10*3/uL (ref 0.00–0.07)
Basophils Absolute: 0 10*3/uL (ref 0.0–0.1)
Basophils Relative: 0 %
Eosinophils Absolute: 0.1 10*3/uL (ref 0.0–0.5)
Eosinophils Relative: 0 %
HCT: 46.4 % (ref 39.0–52.0)
Hemoglobin: 15.9 g/dL (ref 13.0–17.0)
Immature Granulocytes: 1 %
Lymphocytes Relative: 15 %
Lymphs Abs: 1.9 10*3/uL (ref 0.7–4.0)
MCH: 30.6 pg (ref 26.0–34.0)
MCHC: 34.3 g/dL (ref 30.0–36.0)
MCV: 89.2 fL (ref 80.0–100.0)
Monocytes Absolute: 0.8 10*3/uL (ref 0.1–1.0)
Monocytes Relative: 6 %
Neutro Abs: 9.9 10*3/uL — ABNORMAL HIGH (ref 1.7–7.7)
Neutrophils Relative %: 78 %
Platelets: 345 10*3/uL (ref 150–400)
RBC: 5.2 MIL/uL (ref 4.22–5.81)
RDW: 13.8 % (ref 11.5–15.5)
WBC: 12.7 10*3/uL — ABNORMAL HIGH (ref 4.0–10.5)
nRBC: 0 % (ref 0.0–0.2)

## 2020-05-13 LAB — PROTIME-INR
INR: 1.5 — ABNORMAL HIGH (ref 0.8–1.2)
Prothrombin Time: 17.5 seconds — ABNORMAL HIGH (ref 11.4–15.2)

## 2020-05-13 LAB — TROPONIN I (HIGH SENSITIVITY)
Troponin I (High Sensitivity): 58 ng/L — ABNORMAL HIGH (ref <18)
Troponin I (High Sensitivity): 57 ng/L — ABNORMAL HIGH (ref <18)

## 2020-05-13 LAB — SARS CORONAVIRUS 2 (TAT 6-24 HRS): SARS Coronavirus 2: NEGATIVE

## 2020-05-13 LAB — HEPARIN LEVEL (UNFRACTIONATED): Heparin Unfractionated: 0.87 IU/mL — ABNORMAL HIGH (ref 0.30–0.70)

## 2020-05-13 LAB — LACTIC ACID, PLASMA
Lactic Acid, Venous: 2.1 mmol/L (ref 0.5–1.9)
Lactic Acid, Venous: 1.4 mmol/L (ref 0.5–1.9)

## 2020-05-13 LAB — LIPASE, BLOOD: Lipase: 48 U/L (ref 11–51)

## 2020-05-13 LAB — APTT: aPTT: 29 seconds (ref 24–36)

## 2020-05-13 LAB — MRSA PCR SCREENING: MRSA by PCR: NEGATIVE

## 2020-05-13 MED ORDER — AMIODARONE LOAD VIA INFUSION
150.0000 mg | Freq: Once | INTRAVENOUS | Status: AC
  Administered 2020-05-13: 150 mg via INTRAVENOUS
  Filled 2020-05-13: qty 83.34

## 2020-05-13 MED ORDER — SODIUM CHLORIDE 0.9% FLUSH
3.0000 mL | INTRAVENOUS | Status: DC | PRN
  Administered 2020-05-15: 3 mL via INTRAVENOUS

## 2020-05-13 MED ORDER — AMIODARONE HCL IN DEXTROSE 360-4.14 MG/200ML-% IV SOLN
30.0000 mg/h | INTRAVENOUS | Status: DC
  Administered 2020-05-14: 30 mg/h via INTRAVENOUS
  Filled 2020-05-13: qty 200

## 2020-05-13 MED ORDER — AMIODARONE HCL IN DEXTROSE 360-4.14 MG/200ML-% IV SOLN
60.0000 mg/h | INTRAVENOUS | Status: AC
  Administered 2020-05-13 (×3): 60 mg/h via INTRAVENOUS
  Filled 2020-05-13 (×2): qty 200

## 2020-05-13 MED ORDER — CHLORHEXIDINE GLUCONATE CLOTH 2 % EX PADS
6.0000 | MEDICATED_PAD | Freq: Every day | CUTANEOUS | Status: DC
  Administered 2020-05-13 – 2020-05-17 (×5): 6 via TOPICAL

## 2020-05-13 MED ORDER — RIVAROXABAN 20 MG PO TABS
20.0000 mg | ORAL_TABLET | ORAL | Status: AC
  Administered 2020-05-13: 20 mg via ORAL
  Filled 2020-05-13: qty 1

## 2020-05-13 MED ORDER — ONDANSETRON HCL 4 MG/2ML IJ SOLN
4.0000 mg | Freq: Four times a day (QID) | INTRAMUSCULAR | Status: DC | PRN
  Administered 2020-05-13 – 2020-05-14 (×2): 4 mg via INTRAVENOUS
  Filled 2020-05-13 (×2): qty 2

## 2020-05-13 MED ORDER — BUPROPION HCL ER (XL) 150 MG PO TB24
300.0000 mg | ORAL_TABLET | Freq: Every day | ORAL | Status: DC
  Administered 2020-05-14 – 2020-05-18 (×5): 300 mg via ORAL
  Filled 2020-05-13 (×5): qty 2

## 2020-05-13 MED ORDER — AMIODARONE IV BOLUS ONLY 150 MG/100ML
150.0000 mg | Freq: Once | INTRAVENOUS | Status: AC
  Administered 2020-05-13: 150 mg via INTRAVENOUS

## 2020-05-13 MED ORDER — RIVAROXABAN 20 MG PO TABS
20.0000 mg | ORAL_TABLET | Freq: Every day | ORAL | Status: DC
  Administered 2020-05-14 – 2020-05-17 (×4): 20 mg via ORAL
  Filled 2020-05-13 (×4): qty 1

## 2020-05-13 MED ORDER — ALUM & MAG HYDROXIDE-SIMETH 200-200-20 MG/5ML PO SUSP
30.0000 mL | ORAL | Status: DC | PRN
  Administered 2020-05-14: 30 mL via ORAL
  Filled 2020-05-13: qty 30

## 2020-05-13 MED ORDER — HEPARIN (PORCINE) 25000 UT/250ML-% IV SOLN
1500.0000 [IU]/h | INTRAVENOUS | Status: DC
  Filled 2020-05-13: qty 250

## 2020-05-13 MED ORDER — NOREPINEPHRINE 4 MG/250ML-% IV SOLN: 2.0000 ug/min | INTRAVENOUS | Status: DC

## 2020-05-13 MED ORDER — PROPOFOL 10 MG/ML IV BOLUS: 1.0000 mg/kg | Freq: Once | INTRAVENOUS | Status: DC

## 2020-05-13 MED ORDER — FUROSEMIDE 10 MG/ML IJ SOLN
80.0000 mg | Freq: Once | INTRAMUSCULAR | Status: AC
  Administered 2020-05-13: 80 mg via INTRAVENOUS
  Filled 2020-05-13: qty 8

## 2020-05-13 MED ORDER — AMIODARONE LOAD VIA INFUSION: 150.0000 mg | Freq: Once | INTRAVENOUS | Status: DC

## 2020-05-13 MED ORDER — AMIODARONE IV BOLUS ONLY 150 MG/100ML
150.0000 mg | Freq: Once | INTRAVENOUS | Status: AC
  Administered 2020-05-13: 150 mg via INTRAVENOUS
  Filled 2020-05-13: qty 100

## 2020-05-13 MED ORDER — SODIUM CHLORIDE 0.9% FLUSH
3.0000 mL | Freq: Two times a day (BID) | INTRAVENOUS | Status: DC
  Administered 2020-05-13 – 2020-05-17 (×6): 3 mL via INTRAVENOUS

## 2020-05-13 MED ORDER — ACETAMINOPHEN 325 MG PO TABS
650.0000 mg | ORAL_TABLET | ORAL | Status: DC | PRN
  Administered 2020-05-16 – 2020-05-17 (×2): 650 mg via ORAL
  Filled 2020-05-13 (×3): qty 2

## 2020-05-13 MED ORDER — ALLOPURINOL 100 MG PO TABS
100.0000 mg | ORAL_TABLET | Freq: Every day | ORAL | Status: DC
  Administered 2020-05-14 – 2020-05-18 (×5): 100 mg via ORAL
  Filled 2020-05-13 (×5): qty 1

## 2020-05-13 MED ORDER — SODIUM CHLORIDE 0.9% FLUSH: 10.0000 mL | INTRAVENOUS | Status: DC | PRN

## 2020-05-13 MED ORDER — SODIUM CHLORIDE 0.9 % IV SOLN: 250.0000 mL | INTRAVENOUS | Status: DC | PRN

## 2020-05-13 MED ORDER — MILRINONE LACTATE IN DEXTROSE 20-5 MG/100ML-% IV SOLN
0.1250 ug/kg/min | INTRAVENOUS | Status: DC
  Administered 2020-05-13 – 2020-05-15 (×6): 0.25 ug/kg/min via INTRAVENOUS
  Administered 2020-05-16: 0.125 ug/kg/min via INTRAVENOUS
  Filled 2020-05-13 (×6): qty 100

## 2020-05-13 MED ORDER — SODIUM CHLORIDE 0.9 % IV SOLN: 250.0000 mL | INTRAVENOUS | Status: DC

## 2020-05-13 MED ORDER — RIVAROXABAN 20 MG PO TABS: 20.0000 mg | ORAL_TABLET | Freq: Every day | ORAL | Status: DC

## 2020-05-13 MED ORDER — SODIUM CHLORIDE 0.9% FLUSH
10.0000 mL | Freq: Two times a day (BID) | INTRAVENOUS | Status: DC
  Administered 2020-05-13 – 2020-05-17 (×6): 10 mL

## 2020-05-13 NOTE — Patient Instructions (Addendum)
You are going to the [facility] Emergency Room.  When you arrive you will tell the receptionist that you have been sent to the ER by Dr. Lalla Brothers.  Dr. Jodi Mourning is aware that you are arriving for an urgent cardioversion.  If there is any question when you arrive have ER staff call Dierdre Highman at [facility] at [phone].

## 2020-05-13 NOTE — Progress Notes (Signed)
Patients COVID results will be available 21:43 on 05/13/2020

## 2020-05-13 NOTE — Progress Notes (Signed)
Electrophysiology Office Follow up Visit Note:    Date:  05/13/2020   ID:  Alexander [name], DOB 1976/03/30, MRN [id number]  PCP:  Halford Chessman, MD  [facility] Cardiologist:  No primary care provider on file.  [facility] Electrophysiologist:  Lanier Prude, MD    Interval History:    Alexander [name] is a 45 y.o. male who presents for a follow up visit after hospitalization in December for AF and a newly depressed EF.  He was discharged with anticoagulation and an appointment with me today. The goal of today's appointment was to assess response to the new medications and to plan cardioversion. Today he tells me that he has been feeling terribly recently. He has developed nausea/vomiting and a foggy feeling. He has not been able to work. These symptoms have all worsened since leaving the hospital. He also describes worsening of his abdominal distension and a blue discoloration that has developed.      Past Medical History:  Diagnosis Date  . Atrial fibrillation (HCC)   . Gout   . Hyperlipidemia   . Hypertension     Past Surgical History:  Procedure Laterality Date  . SEPTOPLASTY      Current Medications: Current Meds  Medication Sig  . Acetaminophen (ACETAMIN PO) Take by mouth as needed.  Marland Kitchen allopurinol (ZYLOPRIM) 100 MG tablet Take 100 mg by mouth daily.  Marland Kitchen buPROPion (WELLBUTRIN XL) 300 MG 24 hr tablet Take 300 mg by mouth daily.  . clonazePAM (KLONOPIN) 0.5 MG tablet Take 0.5 mg by mouth 2 (two) times daily as needed for anxiety.  . colchicine 0.6 MG tablet Take 0.6 mg by mouth 2 (two) times daily as needed (gout).  . fenofibrate (TRICOR) 145 MG tablet Take 145 mg by mouth daily.  Marland Kitchen losartan (COZAAR) 100 MG tablet Take 100 mg by mouth daily.  . Metoprolol Tartrate (LOPRESSOR) 50 MG tablet Take 1 tablet (50 mg total) by mouth 3 (three) times daily.  Marland Kitchen topiramate (TOPAMAX) 25 MG tablet Take 25 mg by mouth daily.  . traMADol (ULTRAM) 50 MG tablet Take 50 mg by mouth  3 (three) times daily as needed for moderate pain.  Marland Kitchen XARELTO 20 MG TABS tablet Take 1 tablet (20 mg total) by mouth daily.     Allergies:   Albuterol, Bee venom, Metaproterenol, Other, Amoxicillin-pot clavulanate, Cephalexin, and Amoxicillin   Social History   Socioeconomic History  . Marital status: Divorced    Spouse name: Not on file  . Number of children: Not on file  . Years of education: Not on file  . Highest education level: Not on file  Occupational History  . Not on file  Tobacco Use  . Smoking status: Never Smoker  . Smokeless tobacco: Never Used  Substance and Sexual Activity  . Alcohol use: Not Currently  . Drug use: Not Currently  . Sexual activity: Not on file  Other Topics Concern  . Not on file  Social History Narrative  . Not on file   Social Determinants of Health   Financial Resource Strain: Not on file  Food Insecurity: Not on file  Transportation Needs: Not on file  Physical Activity: Not on file  Stress: Not on file  Social Connections: Not on file     Family History: The patient's family history includes COPD in his mother.  ROS:   Please see the history of present illness.    All other systems reviewed and are negative.  EKGs/Labs/Other Studies Reviewed:  The following studies were reviewed today:   EKG:  The ekg ordered today demonstrates atrial fibrillation with RVR at a rate of 140bpm.  Recent Labs: 04/26/2020: Hemoglobin 15.0; Magnesium 1.9; Platelets 249; TSH 3.480 04/27/2020: BUN 11; Creatinine, Ser 1.15; Potassium 3.9; Sodium 140  Recent Lipid Panel No results found for: CHOL, TRIG, HDL, CHOLHDL, VLDL, LDLCALC, LDLDIRECT  Physical Exam:    VS:  BP 126/90   Pulse (!) 139   Ht 6\' 1"  (1.854 m)   Wt (!) 309 lb 3.2 oz (140.3 kg)   SpO2 95%   BMI 40.79 kg/m     Wt Readings from Last 3 Encounters:  05/13/20 (!) 309 lb 3.2 oz (140.3 kg)  04/27/20 (!) 313 lb 8 oz (142.2 kg)  02/24/20 (!) 316 lb 8 oz (143.6 kg)      GEN: obese, uncomfortable appearing HEENT: Normal NECK: No carotid bruits LYMPHATICS: No lymphadenopathy CARDIAC: irregularly irregular, tachycardic. His extremities are cool to touch. His abdomen is mottled and tense. RESPIRATORY:  Clear to auscultation without rales, wheezing or rhonchi  ABDOMEN: tense, mottled skin MUSCULOSKELETAL:  No deformity  SKIN: cool skin on bilateral arms/legs NEUROLOGIC:  Alert and oriented x 3 PSYCHIATRIC:  Normal affect   ASSESSMENT:    1. Atrial fibrillation with RVR (HCC)   2. Acute systolic heart failure (HCC)    PLAN:    In order of problems listed above:  1. Acute on Chronic systolic heart failure I am concerned that Mr Wiedeman is low output given nausea/vomiting, AF w/ RVR, mottled skin,c ool extremities. I do not think that outpatient titration of his medications is a safe plan. I have discussed his case with Dr Allen Derry from our CHF group and the ER physician Dr Shirlee Latch. He needs an urgent cardioversion. He will need volume optimization.  He will eventually need coronary evaluation. Plan to send him directly to ER for cardioversion and admission. ER physician aware.    Medication Adjustments/Labs and Tests Ordered: Current medicines are reviewed at length with the patient today.  Concerns regarding medicines are outlined above.  Orders Placed This Encounter  Procedures  . EKG 12-Lead   No orders of the defined types were placed in this encounter.    Signed, Cephus Richer, MD, [facility]  05/13/2020 2:32 PM    Electrophysiology [facility]

## 2020-05-13 NOTE — ED Triage Notes (Signed)
Pt sent by cardiologist office for cardioversion, pt in a fib rvr 130-160 rate. Oral medications not working. Pt a.o, denies chest pain or sob.

## 2020-05-13 NOTE — H&P (Addendum)
Advanced Heart Failure Team History and Physical Note   PCP:  Molli Barrows, MD  PCP-Cardiology: No primary care provider on file.    EP: Dr Quentin Ore  Reason for Admission:A Fib RVR & A/C Systolic HF    HPI:   Alexander [name] is a 45 year old with a history of obesity, HTN, hyperlipidemia, adjustment disorder, anxiety, and A fib. No previous heart catherization. No recent viral illnesses.   Says he had been drinking heavily and developed increased shortness of breath and chest tightness. Presented to [facility] on 04/24/20 with increased shortness of breath and chest tightness.  EKG showed A Fib RVR. Started on diltiazem with plans for admit but he left AMA.  Discharged on diltiazem + xarelto.   Presented to [facility] ED on 04/25/20 with HF symptoms. EKG showed A Fib RVR. Admitted and started on diltiazem. ECHO showed EF 35-40%. EP consulted. Started on metoprolol and losartan with plans to reassess in 3 weeks for possible cardioversion.   Today he returned for follow up with Dr Quentin Ore. Complaining of N/V fatigue, orthopnea, and shortness of breath.  EKG showed A fib RVR.  On exam he was cool, concerning for shock. he was sent to [facility] ED.   In the ED he remained in A fib RVR. SOB with exertion. EKG showed A fib RVR.    ECHO 04/26/2020  1. Left ventricular ejection fraction, by estimation, is 35 to 40%. Left  ventricular ejection fraction by 2D MOD biplane is 37.5 %. The left  ventricle has moderately decreased function. The left ventricle  demonstrates global hypokinesis. There is mild  concentric left ventricular hypertrophy. Left ventricular diastolic  function could not be evaluated.  2. Right ventricular systolic function is mildly reduced. The right  ventricular size is mildly enlarged. There is normal pulmonary artery  systolic pressure. The estimated right ventricular systolic pressure is  19.5 mmHg.  3. The mitral valve is grossly normal. Mild mitral valve regurgitation.   No evidence of mitral stenosis.  4. The aortic valve is tricuspid. Aortic valve regurgitation is not  visualized. Mild aortic valve sclerosis is present, with no evidence of  aortic valve stenosis.  5. There is mild dilatation of the ascending aorta, measuring 39 mm.  6. The inferior vena cava is normal in size with <50% respiratory  variability, suggesting right atrial pressure of 8 mmHg    Review of Systems: [y] = yes, [ ]  = no   General: Weight gain [ ] ; Weight loss [ ] ; Anorexia [ ] ; Fatigue [ Y]; Fever [ ] ; Chills [ ] ; Weakness [Y ]  Cardiac: Chest pain/pressure [ ] ; Resting SOB [ ] ; Exertional SOB [ Y]; Orthopnea [Y ]; Pedal Edema [ ] ; Palpitations [ ] ; Syncope [ ] ; Presyncope [ ] ; Paroxysmal nocturnal dyspnea[Y ]  Pulmonary: Cough [ ] ; Wheezing[ ] ; Hemoptysis[ ] ; Sputum [ ] ; Snoring [ ]   GI: Vomiting[ ] ; Dysphagia[ ] ; Melena[ ] ; Hematochezia [ ] ; Heartburn[ ] ; Abdominal pain [ ] ; Constipation [ ] ; Diarrhea [ ] ; BRBPR [ ]   GU: Hematuria[ ] ; Dysuria [ ] ; Nocturia[ ]   Vascular: Pain in legs with walking [ ] ; Pain in feet with lying flat [ ] ; Non-healing sores [ ] ; Stroke [ ] ; TIA [ ] ; Slurred speech [ ] ;  Neuro: Headaches[ ] ; Vertigo[ ] ; Seizures[ ] ; Paresthesias[ ] ;Blurred vision [ ] ; Diplopia [ ] ; Vision changes [ ]   Ortho/Skin: Arthritis [ ] ; Joint pain [ Y]; Muscle pain [ ] ; Joint swelling [ ] ;  Back Pain [Y ]; Rash [ ]   Psych: Depression[ Y]; Anxiety[ Y]  Heme: Bleeding problems [ ] ; Clotting disorders [ ] ; Anemia [ ]   Endocrine: Diabetes [ ] ; Thyroid dysfunction[ ]    Home Medications Prior to Admission medications   Medication Sig Start Date End Date Taking? Authorizing Provider  Acetaminophen (ACETAMIN PO) Take by mouth as needed.    [provider]  allopurinol (ZYLOPRIM) 100 MG tablet Take 100 mg by mouth daily. 02/13/20   [provider]  buPROPion (WELLBUTRIN XL) 300 MG 24 hr tablet Take 300 mg by mouth daily. 02/13/20   [provider]   clonazePAM (KLONOPIN) 0.5 MG tablet Take 0.5 mg by mouth 2 (two) times daily as needed for anxiety. 11/27/19   [provider]  colchicine 0.6 MG tablet Take 0.6 mg by mouth 2 (two) times daily as needed (gout). 02/28/20   [provider]  fenofibrate (TRICOR) 145 MG tablet Take 145 mg by mouth daily. 02/13/20   [provider]  losartan (COZAAR) 100 MG tablet Take 100 mg by mouth daily. 02/13/20   [provider]  Metoprolol Tartrate (LOPRESSOR) 50 MG tablet Take 1 tablet (50 mg total) by mouth 3 (three) times daily. 04/27/20   11/29/19, MD  topiramate (TOPAMAX) 25 MG tablet Take 25 mg by mouth daily. 02/13/20   [provider]  traMADol (ULTRAM) 50 MG tablet Take 50 mg by mouth 3 (three) times daily as needed for moderate pain. 02/13/20   [provider]  XARELTO 20 MG TABS tablet Take 1 tablet (20 mg total) by mouth daily. 04/27/20   04/29/20, MD    Past Medical History: Past Medical History:  Diagnosis Date  . Atrial fibrillation (HCC)   . Gout   . Hyperlipidemia   . Hypertension     Past Surgical History: Past Surgical History:  Procedure Laterality Date  . SEPTOPLASTY      Family History:  Family History  Problem Relation Age of Onset  . COPD Mother     Social History: Social History   Socioeconomic History  . Marital status: Divorced    Spouse name: Not on file  . Number of children: Not on file  . Years of education: Not on file  . Highest education level: Not on file  Occupational History  . Not on file  Tobacco Use  . Smoking status: Never Smoker  . Smokeless tobacco: Never Used  Substance and Sexual Activity  . Alcohol use: Not Currently  . Drug use: Not Currently  . Sexual activity: Not on file  Other Topics Concern  . Not on file  Social History Narrative  . Not on file   Social Determinants of Health   Financial Resource Strain: Not on file  Food Insecurity: Not on file   Transportation Needs: Not on file  Physical Activity: Not on file  Stress: Not on file  Social Connections: Not on file    Allergies:  Allergies  Allergen Reactions  . Albuterol Anaphylaxis  . Bee Venom Anaphylaxis  . Metaproterenol Anaphylaxis  . Other Anaphylaxis    DEET  . Amoxicillin-Pot Clavulanate Itching    OK with Amoxicillin  . Cephalexin Other (See Comments)    Chest tightness  . Amoxicillin Itching and Rash    Itching all over      Objective:    Vital Signs:   Temp:  [97.7 F (36.5 C)-98.1 F (36.7 C)] 98.1 F (36.7 C) (01/05 1557) Pulse Rate:  [  140-146] 146 (01/05 1557) Resp:  [18] 18 (01/05 1557) BP: (134-138)/(98-110) 134/110 (01/05 1557) SpO2:  [96 %-97 %] 96 % (01/05 1557)   There were no vitals filed for this visit.   Physical Exam     General:No respiratory difficulty. Sitting straight up.  HEENT: Normal Neck: Supple. JVP difficult to assess due to body habitus.  Carotids 2+ bilat; no bruits. No lymphadenopathy or thyromegaly appreciated. Cor: PMI nondisplaced. Tachy Irregular rate & rhythm. No rubs, gallops or murmurs. Lungs: Clear Abdomen: obese cool, appears mottled, nontender, distended. No hepatosplenomegaly. No bruits or masses. Good bowel sounds. Extremities: cool, no cyanosis, clubbing, rash, edema Neuro: Alert & oriented x 3, cranial nerves grossly intact. moves all 4 extremities w/o difficulty. Affect pleasant.   Telemetry    A fib RVR 140-150s Personally reviewed   EKG    A fib 157 bpm   Labs     Basic Metabolic Panel: No results for input(s): NA, K, CL, CO2, GLUCOSE, BUN, CREATININE, CALCIUM, MG, PHOS in the last 168 hours.  Liver Function Tests: No results for input(s): AST, ALT, ALKPHOS, BILITOT, PROT, ALBUMIN in the last 168 hours. No results for input(s): LIPASE, AMYLASE in the last 168 hours. No results for input(s): AMMONIA in the last 168 hours.  CBC: No results for input(s): WBC, NEUTROABS, HGB, HCT, MCV,  PLT in the last 168 hours.  Cardiac Enzymes: No results for input(s): CKTOTAL, CKMB, CKMBINDEX, TROPONINI in the last 168 hours.  BNP: BNP (last 3 results) No results for input(s): BNP in the last 8760 hours.  ProBNP (last 3 results) No results for input(s): PROBNP in the last 8760 hours.   CBG: No results for input(s): GLUCAP in the last 168 hours.  Coagulation Studies: No results for input(s): LABPROT, INR in the last 72 hours.  Imaging: [place] EKG SITE RITE  Result Date: 05/13/2020 If Site Rite image not attached, placement could not be confirmed due to current cardiac rhythm.    Assessment/Plan   1. A/C Systolic Heart Failure possible cardiogenic shock. -Recently diagnosed reduced EF back in December with EF 35-40%.  -No previous cardiac history. No prior cath. No family history of coronary disease. Had TSH back in December which was not elevated.  Has had chest tightness back in December but no chest pain currently. Eventually would benefit from [facility]. Check HS Trop.  I am concerned this may represent low output heart failure. BP ok but he is cool and with LFTs/AKI we may need to start inotropes.  - Place central line to check CO-OX/CVP now.  - Difficult to assess volume status. - No bb for now with suspected low output.   - No arb with elevated creatinine.  - Check CMET, CBC, BNP, lactic acid  now.   2. A Fib RVR  -Started on amio drip.  -Stop xarelto and start heparin drip in anticipation of cath.   3. HTN  -Elevated. May need to add hydralazine.   4.  Suspected Sleep Apnea -Eventually will need sleep study   5. Obesity  Body mass index is 40.72 kg/m.  6. ETOH  Discussed cessation.   7. AKI Recent creatinine 1.15 back in December. Today up 1.64 Avoid nephrotoxic agents. Has not been taking NSAIDs.   8. LFTs Elevated.  Trend LFTs ? Shock - check LFTs in am .   9. ID WBC elevated.   Admit to ICU. Possible low output heart failure. Adding amio drip. May  to add inotropes.  - Stabilize  and if he remains in A fib will pursue cardioversion. He had breakfast today and was drinking fluids down in the ED>    Tonye Becket, NP 05/13/2020, 4:12 PM  Advanced Heart Failure Team Pager [phone] (M-F; 7a - 4p)  Please contact [facility] for night-coverage after hours (4p -7a ) and weekends on [email or web address]  Agree with above.  History well outlined above 54 y/o obese male with onset of AF and HF in mid December.   Has progressively gotten worse. Seen in EP office today with AF with RVR and low output symptoms. Referred to ER.   Labs show AKI, mild transaminitis and lactic acidosis. Hstrop 57   Mottled on exam though BP preserved.   On exam General:  Apprehensive but no frank resp difficulty HEENT: normal Neck: supple. JVP looks up. Carotids 2+ bilat; no bruits. No lymphadenopathy or thryomegaly appreciated. Cor: PMI nondisplaced. Tachy irregular +s3 Lungs: clear Abdomen: obese distended mottled  No hepatosplenomegaly. No bruits or masses. Good bowel sounds. Extremities: no cyanosis, clubbing, rash, cool. Mottled. Trace edema Neuro: alert & orientedx3, cranial nerves grossly intact. moves all 4 extremities w/o difficulty. Affect pleasant   He is in shock in the setting of low output HF due to persistent AF with RVR  We started IV amio and moved him quickly to 2H. Urgent DC-CV avoided as patient deemed to be too unstable to tolerate.   I did bedside echo and LVEF 20% with moderate RV dysfunction.  Once in ICU, we started him on milrinone 0.25 and had PICC line placed urgently. Co-ox now up to 55% and lactate cleared. He is symptomatically much improved. Will continue IV amio for now and plan DC-CV in the next 24-48 hours when more stable hemodynamically. .He has been on Xarelto now for about 19 days.  He will need eventual cath to excluded underlying CAD but this can wait particularly as hstrop only 57 in setting of severe hemodynamic stress.    CRITICAL CARE Performed by: Arvilla Meres  Total critical care time: 60 minutes  Critical care time was exclusive of separately billable procedures and treating other patients.  Critical care was necessary to treat or prevent imminent or life-threatening deterioration.  Critical care was time spent personally by me (independent of midlevel providers or residents) on the following activities: development of treatment plan with patient and/or surrogate as well as nursing, discussions with consultants, evaluation of patient's response to treatment, examination of patient, obtaining history from patient or surrogate, ordering and performing treatments and interventions, ordering and review of laboratory studies, ordering and review of radiographic studies, pulse oximetry and re-evaluation of patient's condition.  Arvilla Meres, MD  10:12 PM

## 2020-05-13 NOTE — Progress Notes (Signed)
Peripherally Inserted Central Catheter Placement  The IV Nurse has discussed with the patient and/or persons authorized to consent for the patient, the purpose of this procedure and the potential benefits and risks involved with this procedure.  The benefits include less needle sticks, lab draws from the catheter, and the patient may be discharged home with the catheter. Risks include, but not limited to, infection, bleeding, blood clot (thrombus formation), and puncture of an artery; nerve damage and irregular heartbeat and possibility to perform a PICC exchange if needed/ordered by physician.  Alternatives to this procedure were also discussed.  Bard Power PICC patient education guide, fact sheet on infection prevention and patient information card has been provided to patient /or left at bedside.    PICC Placement Documentation  PICC Double Lumen 05/13/20 PICC Right Cephalic 44 cm 0 cm (Active)  Indication for Insertion or Continuance of Line Vasoactive infusions 05/13/20 2000  Exposed Catheter (cm) 0 cm 05/13/20 2000  Site Assessment Clean;Dry;Intact 05/13/20 2000  Lumen #1 Status Flushed;Saline locked;Blood return noted 05/13/20 2000  Lumen #2 Status Saline locked;Flushed;Blood return noted 05/13/20 2000  Dressing Type Transparent 05/13/20 2000  Dressing Status Clean;Intact;Dry 05/13/20 2000  Antimicrobial disc in place? Yes 05/13/20 2000  Dressing Intervention New dressing 05/13/20 2000  Dressing Change Due 05/20/20 05/13/20 2000       Ethelda Chick 05/13/2020, 8:01 PM

## 2020-05-13 NOTE — Progress Notes (Signed)
  Amiodarone Drug - Drug Interaction Consult Note  Recommendations: No drug-drug interactions with amiodarone identified with pta med list.   Amiodarone is metabolized by the cytochrome P450 system and therefore has the potential to cause many drug interactions. Amiodarone has an average plasma half-life of 50 days (range 20 to 100 days).   There is potential for drug interactions to occur several weeks or months after stopping treatment and the onset of drug interactions may be slow after initiating amiodarone.   []  Statins: Increased risk of myopathy. Simvastatin- restrict dose to 20mg  daily. Other statins: counsel patients to report any muscle pain or weakness immediately.  []  Anticoagulants: Amiodarone can increase anticoagulant effect. Consider warfarin dose reduction. Patients should be monitored closely and the dose of anticoagulant altered accordingly, remembering that amiodarone levels take several weeks to stabilize.  []  Antiepileptics: Amiodarone can increase plasma concentration of phenytoin, the dose should be reduced. Note that small changes in phenytoin dose can result in large changes in levels. Monitor patient and counsel on signs of toxicity.  []  Beta blockers: increased risk of bradycardia, AV block and myocardial depression. Sotalol - avoid concomitant use.  []   Calcium channel blockers (diltiazem and verapamil): increased risk of bradycardia, AV block and myocardial depression.  []   Cyclosporine: Amiodarone increases levels of cyclosporine. Reduced dose of cyclosporine is recommended.  []  Digoxin dose should be halved when amiodarone is started.  []  Diuretics: increased risk of cardiotoxicity if hypokalemia occurs.  []  Oral hypoglycemic agents (glyburide, glipizide, glimepiride): increased risk of hypoglycemia. Patient's glucose levels should be monitored closely when initiating amiodarone therapy.   []  Drugs that prolong the QT interval:  Torsades de pointes risk  may be increased with concurrent use - avoid if possible.  Monitor QTc, also keep magnesium/potassium WNL if concurrent therapy can't be avoided. Antibiotics: e.g. fluoroquinolones, erythromycin. . Antiarrhythmics: e.g. quinidine, procainamide, disopyramide, sotalol. . Antipsychotics: e.g. phenothiazines, haloperidol.  . Lithium, tricyclic antidepressants, and methadone.  Thank You,   , PharmD PGY-1 Pharmacy Resident 05/13/2020 4:04 PM Please see AMION for all pharmacy numbers

## 2020-05-13 NOTE — ED Provider Notes (Signed)
[place] EMERGENCY DEPARTMENT Provider Note   CSN: [id number] Arrival date & time: 05/13/20  1453     History Chief Complaint  Patient presents with  . Atrial Fibrillation    Alexander [name] is a 45 y.o. male with a past medical history of A. fib, CHF with an EF of 35 to 40% seen on echo in December presenting to the ED from cardiologist office for A. fib.  Patient was first diagnosed with A. fib in December and was discharged with anticoagulation, Xarelto and rate control medications.  He is concerned that the medications are causing unwanted side effects including bloating, nausea and dizziness.  He has been compliant with these medications.  Cardiology was concerned that he is not adequately controlled with medications and will need cardioversion in the ER as well as admission for further medication management.  Patient denies any chest pain, shortness of breath, palpitations, leg swelling, vomiting, fever.  HPI     Past Medical History:  Diagnosis Date  . Atrial fibrillation ([place])   . Gout   . Hyperlipidemia   . Hypertension     Patient Active Problem List   Diagnosis Date Noted  . Acute systolic heart failure ([place]) 05/13/2020  . Acute on chronic systolic (congestive) heart failure ([place]) 05/13/2020  . Atrial fibrillation with RVR ([place]) 04/26/2020  . HLD (hyperlipidemia) 04/26/2020  . HTN (hypertension) 04/26/2020  . Depression 04/26/2020  . Gout 04/26/2020  . Panic attacks 04/26/2020  . Low back pain 04/26/2020    Past Surgical History:  Procedure Laterality Date  . SEPTOPLASTY         Family History  Problem Relation Age of Onset  . COPD Mother     Social History   Tobacco Use  . Smoking status: Never Smoker  . Smokeless tobacco: Never Used  Substance Use Topics  . Alcohol use: Not Currently  . Drug use: Not Currently    Home Medications Prior to Admission medications   Medication Sig Start Date End Date Taking? Authorizing  Provider  Acetaminophen (ACETAMIN PO) Take by mouth as needed.    [provider]  allopurinol (ZYLOPRIM) 100 MG tablet Take 100 mg by mouth daily. 02/13/20   [provider]  buPROPion (WELLBUTRIN XL) 300 MG 24 hr tablet Take 300 mg by mouth daily. 02/13/20   [provider]  clonazePAM (KLONOPIN) 0.5 MG tablet Take 0.5 mg by mouth 2 (two) times daily as needed for anxiety. 11/27/19   [provider]  colchicine 0.6 MG tablet Take 0.6 mg by mouth 2 (two) times daily as needed (gout). 02/28/20   [provider]  fenofibrate (TRICOR) 145 MG tablet Take 145 mg by mouth daily. 02/13/20   [provider]  losartan (COZAAR) 100 MG tablet Take 100 mg by mouth daily. 02/13/20   [provider]  Metoprolol Tartrate (LOPRESSOR) 50 MG tablet Take 1 tablet (50 mg total) by mouth 3 (three) times daily. 04/27/20   Nita Sells, MD  topiramate (TOPAMAX) 25 MG tablet Take 25 mg by mouth daily. 02/13/20   [provider]  traMADol (ULTRAM) 50 MG tablet Take 50 mg by mouth 3 (three) times daily as needed for moderate pain. 02/13/20   [provider]  XARELTO 20 MG TABS tablet Take 1 tablet (20 mg total) by mouth daily. 04/27/20   Nita Sells, MD    Allergies    Albuterol, Bee venom, Metaproterenol, Other, Amoxicillin-pot clavulanate, Cephalexin, and Amoxicillin  Review of Systems  Review of Systems  Constitutional: Negative for appetite change, chills and fever.  HENT: Negative for ear pain, rhinorrhea, sneezing and sore throat.   Eyes: Negative for photophobia and visual disturbance.  Respiratory: Negative for cough, chest tightness, shortness of breath and wheezing.   Cardiovascular: Negative for chest pain and palpitations.  Gastrointestinal: Positive for abdominal distention. Negative for abdominal pain, blood in stool, constipation, diarrhea, nausea and vomiting.  Genitourinary: Negative for dysuria, hematuria  and urgency.  Musculoskeletal: Negative for myalgias.  Skin: Negative for rash.  Neurological: Negative for dizziness, weakness and light-headedness.    Physical Exam Updated Vital Signs BP (!) 134/110 (BP Location: Right Arm)   Pulse (!) 146   Temp 98.1 F (36.7 C) (Oral)   Resp 18   SpO2 96%   Physical Exam Vitals and nursing note reviewed.  Constitutional:      General: He is not in acute distress.    Appearance: He is well-developed and well-nourished. He is obese.  HENT:     Head: Normocephalic and atraumatic.     Nose: Nose normal.  Eyes:     General: No scleral icterus.       Right eye: No discharge.        Left eye: No discharge.     Extraocular Movements: EOM normal.     Conjunctiva/sclera: Conjunctivae normal.  Cardiovascular:     Rate and Rhythm: Tachycardia present. Rhythm irregularly irregular.     Pulses: Intact distal pulses.     Heart sounds: Normal heart sounds. No murmur heard. No friction rub. No gallop.   Pulmonary:     Effort: Pulmonary effort is normal. No respiratory distress.     Breath sounds: Normal breath sounds.  Abdominal:     General: Bowel sounds are normal. There is no distension.     Palpations: Abdomen is soft.     Tenderness: There is no abdominal tenderness. There is no guarding.  Musculoskeletal:        General: No edema. Normal range of motion.     Cervical back: Normal range of motion and neck supple.  Skin:    General: Skin is warm and dry.     Findings: No rash.  Neurological:     Mental Status: He is alert.     Motor: No abnormal muscle tone.     Coordination: Coordination normal.  Psychiatric:        Mood and Affect: Mood and affect normal.     ED Results / Procedures / Treatments   Labs (all labs ordered are listed, but only abnormal results are displayed) Labs Reviewed  SARS CORONAVIRUS 2 (TAT 6-24 HRS)  COMPREHENSIVE METABOLIC PANEL  LIPASE, BLOOD  PROTIME-INR  CBC WITH DIFFERENTIAL/PLATELET  LACTIC ACID,  PLASMA  LACTIC ACID, PLASMA  TROPONIN I (HIGH SENSITIVITY)    EKG EKG Interpretation  Date/Time:  Wednesday May 13 2020 15:12:22 EST Ventricular Rate:  144 PR Interval:    QRS Duration: 90 QT Interval:  326 QTC Calculation: 504 R Axis:   114 Text Interpretation: Atrial fibrillation with rapid ventricular response Right axis deviation Anterior infarct , age undetermined Abnormal ECG No significant change since last tracing Confirmed by Susy Frizzle [phone]) on 05/13/2020 3:30:06 PM   Radiology No results found.  Procedures Procedures (including critical care time)  Medications Ordered in ED Medications  amiodarone (NEXTERONE) 1.8 mg/mL load via infusion 150 mg (has no administration in time range)    ED Course  I have reviewed the triage  vital signs and the nursing notes.  Pertinent labs & imaging results that were available during my care of the patient were reviewed by me and considered in my medical decision making (see chart for details).  Clinical Course as of 05/13/20 1558  Wed May 13, 2020  1546 EKG shows A. fib with RVR with rates in the 140s. [HK]    Clinical Course User Index [HK] Dietrich Pates, PA-C   MDM Rules/Calculators/A&P                          45 year old male with past medical history of A. fib, CHF with an EF of 35 to 40% seen on echo last month, currently on Xarelto and metoprolol presenting to the ED with a chief complaint of A. fib.  He was sent over from cardiology office earlier today due to concern for side effects from his medications.  He was found to be in A. fib with RVR.  He denies chest pain, shortness of breath.  He has been compliant with his medications.  It was recommended that he be cardioverted in the ER and then admitted for medication management and volume optimization.  On arrival patient in no acute distress.  Heart rate between 140s to 150s in A. fib.  No lower extremity edema, erythema or calf tenderness bilaterally.  Abdomen  is distended although I am unsure if this is different from his baseline.  Lab work has been ordered.  Patient was evaluated by heart failure team at the bedside.  They do not recommend cardioversion at this time but will instead begin him on amiodarone and admit to their service.  Appreciate the help of cardiology for management of this patient.   Portions of this note were generated with Scientist, clinical (histocompatibility and immunogenetics). Dictation errors may occur despite best attempts at proofreading.  Final Clinical Impression(s) / ED Diagnoses Final diagnoses:  Atrial fibrillation with RVR (HCC)  Acute congestive heart failure, unspecified heart failure type [facility])    Rx / DC Orders ED Discharge Orders    None       Dietrich Pates, PA-C 05/13/20 1600    Terrilee Files, MD 05/13/20 2235

## 2020-05-13 NOTE — Progress Notes (Signed)
ANTICOAGULATION CONSULT NOTE - Initial Consult  Pharmacy Consult for heparin Indication: atrial fibrillation  Allergies  Allergen Reactions  . Albuterol Anaphylaxis  . Bee Venom Anaphylaxis  . Metaproterenol Anaphylaxis  . Other Anaphylaxis    DEET  . Amoxicillin-Pot Clavulanate Itching    OK with Amoxicillin  . Cephalexin Other (See Comments)    Chest tightness  . Amoxicillin Itching and Rash    Itching all over      Patient Measurements: Height: 6\' 1"  (185.4 cm) Weight: (!) 140 kg (308 lb 10.3 oz) IBW/kg (Calculated) : 79.9 Heparin Dosing Weight: 111kg  Vital Signs: Temp: 99.1 F (37.3 C) (01/05 1641) Temp Source: Oral (01/05 1641) BP: 154/127 (01/05 1641) Pulse Rate: 130 (01/05 1641)  Labs: Recent Labs    05/13/20 1535  HGB 15.9  HCT 46.4  PLT 345  LABPROT 17.5*  INR 1.5*  CREATININE 1.64*    Estimated Creatinine Clearance: 85.4 mL/min (A) (by C-G formula based on SCr of 1.64 mg/dL (H)).   Medical History: Past Medical History:  Diagnosis Date  . Atrial fibrillation (HCC)   . Gout   . Hyperlipidemia   . Hypertension     Assessment: 63 yoM admitted with acute HF and AFib RVR. Pt takes rivaroxaban at home for AFib, last dose PTA on 1/4. Will hold and start heparin drip as pt will likely need invasive procedures. CBC wnl on admit.   Goal of Therapy:  Heparin level 0.3-0.7 units/ml aPTT 66-102 seconds Monitor platelets by anticoagulation protocol: Yes   Plan:  Heparin 1500 units/h no bolus Check aPTT in 6h Daily heparin level and aPTT until correlating Follow CBC, S/Sx bleeding, restart of OAC   3/4, PharmD, BCPS, [facility] Clinical Pharmacist [phone] Please check AMION for all [facility] numbers 05/13/2020

## 2020-05-14 ENCOUNTER — Inpatient Hospital Stay (HOSPITAL_COMMUNITY): Payer: BC Managed Care – PPO

## 2020-05-14 DIAGNOSIS — I5021 Acute systolic (congestive) heart failure: Secondary | ICD-10-CM

## 2020-05-14 LAB — BASIC METABOLIC PANEL
Anion gap: 15 (ref 5–15)
Anion gap: 12 (ref 5–15)
BUN: 18 mg/dL (ref 6–20)
BUN: 17 mg/dL (ref 6–20)
CO2: 21 mmol/L — ABNORMAL LOW (ref 22–32)
CO2: 27 mmol/L (ref 22–32)
Calcium: 8.7 mg/dL — ABNORMAL LOW (ref 8.9–10.3)
Calcium: 9 mg/dL (ref 8.9–10.3)
Chloride: 96 mmol/L — ABNORMAL LOW (ref 98–111)
Chloride: 100 mmol/L (ref 98–111)
Creatinine, Ser: 1.53 mg/dL — ABNORMAL HIGH (ref 0.61–1.24)
Creatinine, Ser: 1.46 mg/dL — ABNORMAL HIGH (ref 0.61–1.24)
GFR, Estimated: 57 mL/min — ABNORMAL LOW (ref >60)
GFR, Estimated: >60 mL/min (ref >60)
Glucose, Bld: 110 mg/dL — ABNORMAL HIGH (ref 70–99)
Glucose, Bld: 110 mg/dL — ABNORMAL HIGH (ref 70–99)
Potassium: 3.4 mmol/L — ABNORMAL LOW (ref 3.5–5.1)
Potassium: 3.3 mmol/L — ABNORMAL LOW (ref 3.5–5.1)
Sodium: 132 mmol/L — ABNORMAL LOW (ref 135–145)
Sodium: 139 mmol/L (ref 135–145)

## 2020-05-14 LAB — CBC
HCT: 47.8 % (ref 39.0–52.0)
Hemoglobin: 15.8 g/dL (ref 13.0–17.0)
MCH: 29.1 pg (ref 26.0–34.0)
MCHC: 33.1 g/dL (ref 30.0–36.0)
MCV: 88 fL (ref 80.0–100.0)
Platelets: 315 10*3/uL (ref 150–400)
RBC: 5.43 MIL/uL (ref 4.22–5.81)
RDW: 13.9 % (ref 11.5–15.5)
WBC: 10.9 10*3/uL — ABNORMAL HIGH (ref 4.0–10.5)
nRBC: 0 % (ref 0.0–0.2)

## 2020-05-14 LAB — COOXEMETRY PANEL
Carboxyhemoglobin: 1.3 % (ref 0.5–1.5)
Methemoglobin: 0.7 % (ref 0.0–1.5)
O2 Saturation: 67.7 %
Total hemoglobin: 15.5 g/dL (ref 12.0–16.0)

## 2020-05-14 LAB — ECHOCARDIOGRAM COMPLETE
Height: 73 in
S' Lateral: 5.4 cm
Weight: 4832.48 oz

## 2020-05-14 LAB — MAGNESIUM: Magnesium: 1.9 mg/dL (ref 1.7–2.4)

## 2020-05-14 MED ORDER — POTASSIUM CHLORIDE CRYS ER 20 MEQ PO TBCR
40.0000 meq | EXTENDED_RELEASE_TABLET | Freq: Once | ORAL | Status: AC
  Administered 2020-05-14: 40 meq via ORAL
  Filled 2020-05-14: qty 2

## 2020-05-14 MED ORDER — FUROSEMIDE 10 MG/ML IJ SOLN
80.0000 mg | Freq: Two times a day (BID) | INTRAMUSCULAR | Status: DC
  Administered 2020-05-14 – 2020-05-16 (×6): 80 mg via INTRAVENOUS
  Filled 2020-05-14 (×6): qty 8

## 2020-05-14 MED ORDER — AMIODARONE LOAD VIA INFUSION
150.0000 mg | Freq: Once | INTRAVENOUS | Status: AC
  Administered 2020-05-14: 150 mg via INTRAVENOUS
  Filled 2020-05-14: qty 83.34

## 2020-05-14 MED ORDER — PERFLUTREN LIPID MICROSPHERE
1.0000 mL | INTRAVENOUS | Status: AC | PRN
  Administered 2020-05-14: 3 mL via INTRAVENOUS
  Filled 2020-05-14: qty 10

## 2020-05-14 MED ORDER — MAGNESIUM SULFATE 2 GM/50ML IV SOLN
2.0000 g | Freq: Once | INTRAVENOUS | Status: AC
  Administered 2020-05-14: 2 g via INTRAVENOUS
  Filled 2020-05-14: qty 50

## 2020-05-14 MED ORDER — DIGOXIN 125 MCG PO TABS
0.1250 mg | ORAL_TABLET | Freq: Every day | ORAL | Status: DC
  Administered 2020-05-14 – 2020-05-18 (×5): 0.125 mg via ORAL
  Filled 2020-05-14 (×5): qty 1

## 2020-05-14 MED ORDER — CALCIUM CARBONATE ANTACID 500 MG PO CHEW
1.0000 | CHEWABLE_TABLET | Freq: Three times a day (TID) | ORAL | Status: DC | PRN
  Administered 2020-05-14: 200 mg via ORAL
  Filled 2020-05-14: qty 1

## 2020-05-14 MED ORDER — POTASSIUM CHLORIDE CRYS ER 10 MEQ PO TBCR
20.0000 meq | EXTENDED_RELEASE_TABLET | Freq: Two times a day (BID) | ORAL | Status: DC
  Administered 2020-05-14 – 2020-05-15 (×2): 20 meq via ORAL
  Filled 2020-05-14 (×5): qty 2

## 2020-05-14 MED ORDER — AMIODARONE HCL IN DEXTROSE 360-4.14 MG/200ML-% IV SOLN
30.0000 mg/h | INTRAVENOUS | Status: DC
  Administered 2020-05-14 – 2020-05-16 (×9): 60 mg/h via INTRAVENOUS
  Administered 2020-05-16: 30 mg/h via INTRAVENOUS
  Administered 2020-05-16: 60 mg/h via INTRAVENOUS
  Administered 2020-05-17 – 2020-05-18 (×3): 30 mg/h via INTRAVENOUS
  Filled 2020-05-14 (×12): qty 200

## 2020-05-14 MED ORDER — SPIRONOLACTONE 25 MG PO TABS
25.0000 mg | ORAL_TABLET | Freq: Every day | ORAL | Status: DC
  Administered 2020-05-14 – 2020-05-18 (×5): 25 mg via ORAL
  Filled 2020-05-14 (×5): qty 1

## 2020-05-14 MED ORDER — POTASSIUM CHLORIDE CRYS ER 20 MEQ PO TBCR
60.0000 meq | EXTENDED_RELEASE_TABLET | Freq: Once | ORAL | Status: AC
  Administered 2020-05-14: 60 meq via ORAL
  Filled 2020-05-14: qty 3

## 2020-05-14 NOTE — Progress Notes (Signed)
Progress Note  Patient Name: Alexander [name] Date of Encounter: 05/14/2020  [facility] Cardiologist:   Subjective   Feeling better this morning, spent much of the night very nauseous, one retching episode result in a small amount of clear emesis, though reports a large amount of diarrhea and since then has fely much better and has slept a bit in the last few hours No CP, no rest SOB  Inpatient Medications    Scheduled Meds: . allopurinol  100 mg Oral Daily  . buPROPion  300 mg Oral Daily  . Chlorhexidine Gluconate Cloth  6 each Topical Daily  . rivaroxaban  20 mg Oral Q supper  . sodium chloride flush  10-40 mL Intracatheter Q12H  . sodium chloride flush  3 mL Intravenous Q12H   Continuous Infusions: . sodium chloride    . sodium chloride    . amiodarone 30 mg/hr (05/14/20 0500)  . milrinone 0.25 mcg/kg/min (05/14/20 0500)  . norepinephrine (LEVOPHED) Adult infusion     PRN Meds: sodium chloride, acetaminophen, alum & mag hydroxide-simeth, calcium carbonate, ondansetron (ZOFRAN) IV, sodium chloride flush, sodium chloride flush   Vital Signs    Vitals:   05/14/20 0430 05/14/20 0445 05/14/20 0500 05/14/20 0600  BP: (!) 144/100 138/87 133/83 110/82  Pulse:      Resp: (!) 26 (!) 22 (!) 23 20  Temp: 98.2 F (36.8 C)     TempSrc: Oral     SpO2: 91% 91% 91% 92%  Weight:      Height:        Intake/Output Summary (Last 24 hours) at 05/14/2020 0730 Last data filed at 05/14/2020 0500 Gross per 24 hour  Intake 590.9 ml  Output 2280 ml  Net -1689.1 ml   Last 3 Weights 05/14/2020 05/13/2020 05/13/2020  Weight (lbs) 302 lb 0.5 oz 308 lb 10.3 oz 309 lb 3.2 oz  Weight (kg) 137 kg 140 kg 140.252 kg      Telemetry    AFib 150's generally, some faster - Personally Reviewed  ECG    No new EKGs- Personally Reviewed  Physical Exam   GEN: No acute distress.   Neck: No JVD Cardiac: irreg-irreg, tachycardic, no murmurs, rubs, or gallops.  Respiratory: CTA b/l GI: Soft,  nontender, non-distended, warm, no mottling noted today MS: trace edema; No deformity, extrem are much warmer today Neuro:  Nonfocal  Psych: Normal affect   Labs    High Sensitivity Troponin:   Recent Labs  Lab 04/25/20 1653 04/25/20 2055 05/13/20 1558 05/13/20 1956  TROPONINIHS 68* 69* 58* 57*      Chemistry Recent Labs  Lab 05/13/20 1535 05/14/20 0022  NA 137 132*  K 4.0 3.4*  CL 103 96*  CO2 22 21*  GLUCOSE 116* 110*  BUN 20 18  CREATININE 1.64* 1.53*  CALCIUM 9.4 8.7*  PROT 6.6  --   ALBUMIN 4.2  --   AST 76*  --   ALT 114*  --   ALKPHOS 55  --   BILITOT 1.4*  --   GFRNONAA 53* 57*  ANIONGAP 12 15     Hematology Recent Labs  Lab 05/13/20 1535 05/13/20 2006 05/14/20 0022  WBC 12.7* 12.1* 10.9*  RBC 5.20 5.19 5.43  HGB 15.9 15.3 15.8  HCT 46.4 45.7 47.8  MCV 89.2 88.1 88.0  MCH 30.6 29.5 29.1  MCHC 34.3 33.5 33.1  RDW 13.8 13.8 13.9  PLT 345 289 315    BNPNo results for input(s): BNP, PROBNP in  the last 168 hours.   DDimer No results for input(s): DDIMER in the last 168 hours.   Radiology    DG CHEST PORT 1 VIEW  Result Date: 05/13/2020 CLINICAL DATA:  PICC placement EXAM: PORTABLE CHEST 1 VIEW COMPARISON:  04/25/2020 FINDINGS: Single frontal view of the chest demonstrates stable enlargement the cardiac silhouette. Bibasilar veiling opacities are noted consistent with consolidation and small effusions. No pneumothorax. Right-sided PICC tip projects over the superior vena cava. IMPRESSION: 1. Right-sided PICC as above. 2. Bibasilar consolidation and small effusions. Electronically Signed   By: Sharlet Salina M.D.   On: 05/13/2020 20:37   [place] EKG SITE RITE  Result Date: 05/13/2020 If Site Rite image not attached, placement could not be confirmed due to current cardiac rhythm.   Cardiac Studies   04/26/2020: TTE IMPRESSIONS  1. Left ventricular ejection fraction, by estimation, is 35 to 40%. Left  ventricular ejection fraction by 2D MOD biplane  is 37.5 %. The left  ventricle has moderately decreased function. The left ventricle  demonstrates global hypokinesis. There is mild  concentric left ventricular hypertrophy. Left ventricular diastolic  function could not be evaluated.  2. Right ventricular systolic function is mildly reduced. The right  ventricular size is mildly enlarged. There is normal pulmonary artery  systolic pressure. The estimated right ventricular systolic pressure is  34.8 mmHg.  3. The mitral valve is grossly normal. Mild mitral valve regurgitation.  No evidence of mitral stenosis.  4. The aortic valve is tricuspid. Aortic valve regurgitation is not  visualized. Mild aortic valve sclerosis is present, with no evidence of  aortic valve stenosis.  5. There is mild dilatation of the ascending aorta, measuring 39 mm.  6. The inferior vena cava is normal in size with <50% respiratory  variability, suggesting right atrial pressure of 8 mmHg.   Patient Profile     45 y.o. male obesity, HTN, hyperlipidemia, adjustment disorder, anxiety, and A fib.  AFib noted initially last month, was hospitalized for AFib RVR and new acute CHF last month discharged on a/c and BB/ARB with plans to visit DCCV after uninterrupted a/c. He saw Dre. Lalla Brothers yesterday not feeling well, weak, nauseous, and back in RVR, suspect low output failure and referred to the ER with thought of urgent DCCV and HF admission.  He was admitted by the HF team, not felt stable enough for DCCV, started on milrinone and amiodarone, IV diuretic  AFib Hx Diagnosed Dec 2021 AAD None  Xarelto started 04/25/20, he reports no missed doses since started  Assessment & Plan    1. Acute on chronic CHF     LVEF 353-40%  Last month, updated echo ordered, is pending     Suspect low output     Coox today 67     Cumulatively negative -     feeling better this AM     C/w AHF team   2. Persistent AFib     CHA2DS2Vasc is 2, on Xarelto,  appropriately dosed     Pt reports no missed doses since start (18 days yesterday)     RVR, 150'smostly      Will defer DCCV timing to AHF team   3. HTN     ooc overnight, better this AM     Off home meds     Pending AHF team visit today  4. AKI     Creat down slightly  Appreciate HF team Dr. Lalla Brothers has seen and examined the patient this morning EP  will follow on the periphery  For questions or updates, please contact [facility] Please consult [email or web address] for contact info under        Signed, Sheilah Pigeon, PA-C  05/14/2020, 7:30 AM

## 2020-05-14 NOTE — Progress Notes (Addendum)
Advanced Heart Failure Rounding Note   Subjective:    On milrinone 0.25 and amio. Remains in AF with RVR 130-150s.   Feels much better this am. Ab pain resolved. Denies CP, SOB or palpitations. Had good BM. Now feeling hungry.  Co-ox 68% CVP 15. Diuresed well with IV lasix. Creatinine 1.6 -> 1.5   Objective:   Weight Range:  Vital Signs:   Temp:  [97.7 F (36.5 C)-99.1 F (37.3 C)] 98.2 F (36.8 C) (01/06 0430) Pulse Rate:  [27-152] 27 (01/05 1915) Resp:  [14-33] 20 (01/06 0700) BP: (110-174)/(82-149) 113/83 (01/06 0700) SpO2:  [88 %-97 %] 93 % (01/06 0700) Weight:  [137 kg-140.3 kg] 137 kg (01/06 0410) Last BM Date: 05/13/20  Weight change: Filed Weights   05/13/20 1641 05/14/20 0410  Weight: (!) 140 kg (!) 137 kg    Intake/Output:   Intake/Output Summary (Last 24 hours) at 05/14/2020 0833 Last data filed at 05/14/2020 0700 Gross per 24 hour  Intake 645.37 ml  Output 2280 ml  Net -1634.63 ml     Physical Exam: General:  Sitting up in bed No resp difficulty HEENT: normal Neck: supple. JVP to jaw . Carotids 2+ bilat; no bruits. No lymphadenopathy or thryomegaly appreciated. Cor: PMI nondisplaced. Irregular tachy + s3 Lungs: clear Abdomen: obses soft, nontender, nondistended. No hepatosplenomegaly. No bruits or masses. Good bowel sounds. Extremities: no cyanosis, clubbing, rash, tr edema Neuro: alert & orientedx3, cranial nerves grossly intact. moves all 4 extremities w/o difficulty. Affect pleasant  Telemetry:  AF 130-150 Personally reviewed   Labs: Basic Metabolic Panel: Recent Labs  Lab 05/13/20 1535 05/14/20 0022  NA 137 132*  K 4.0 3.4*  CL 103 96*  CO2 22 21*  GLUCOSE 116* 110*  BUN 20 18  CREATININE 1.64* 1.53*  CALCIUM 9.4 8.7*    Liver Function Tests: Recent Labs  Lab 05/13/20 1535  AST 76*  ALT 114*  ALKPHOS 55  BILITOT 1.4*  PROT 6.6  ALBUMIN 4.2   Recent Labs  Lab 05/13/20 1535  LIPASE 48   No results for input(s):  AMMONIA in the last 168 hours.  CBC: Recent Labs  Lab 05/13/20 1535 05/13/20 2006 05/14/20 0022  WBC 12.7* 12.1* 10.9*  NEUTROABS 9.9*  --   --   HGB 15.9 15.3 15.8  HCT 46.4 45.7 47.8  MCV 89.2 88.1 88.0  PLT 345 289 315    Cardiac Enzymes: No results for input(s): CKTOTAL, CKMB, CKMBINDEX, TROPONINI in the last 168 hours.  BNP: BNP (last 3 results) No results for input(s): BNP in the last 8760 hours.  ProBNP (last 3 results) No results for input(s): PROBNP in the last 8760 hours.    Other results:  Imaging: DG CHEST PORT 1 VIEW  Result Date: 05/13/2020 CLINICAL DATA:  PICC placement EXAM: PORTABLE CHEST 1 VIEW COMPARISON:  04/25/2020 FINDINGS: Single frontal view of the chest demonstrates stable enlargement the cardiac silhouette. Bibasilar veiling opacities are noted consistent with consolidation and small effusions. No pneumothorax. Right-sided PICC tip projects over the superior vena cava. IMPRESSION: 1. Right-sided PICC as above. 2. Bibasilar consolidation and small effusions. Electronically Signed   By: Sharlet Salina M.D.   On: 05/13/2020 20:37   [place] EKG SITE RITE  Result Date: 05/13/2020 If Site Rite image not attached, placement could not be confirmed due to current cardiac rhythm.     Medications:     Scheduled Medications: . allopurinol  100 mg Oral Daily  . buPROPion  300 mg Oral Daily  . Chlorhexidine Gluconate Cloth  6 each Topical Daily  . rivaroxaban  20 mg Oral Q supper  . sodium chloride flush  10-40 mL Intracatheter Q12H  . sodium chloride flush  3 mL Intravenous Q12H     Infusions: . sodium chloride    . sodium chloride    . amiodarone 30 mg/hr (05/14/20 0700)  . milrinone 0.25 mcg/kg/min (05/14/20 0736)  . norepinephrine (LEVOPHED) Adult infusion       PRN Medications:  sodium chloride, acetaminophen, alum & mag hydroxide-simeth, calcium carbonate, ondansetron (ZOFRAN) IV, perflutren lipid microspheres (DEFINITY) IV suspension,  sodium chloride flush, sodium chloride flush   Assessment/Plan:    1. A/C Systolic Heart Failure -> cardiogenic shock. -Recently diagnosed reduced EF back in December with EF 35-40%. 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 Can defer ischemic eval for now. Cath eventually - On milrinone 0.25. Co-ox improved to 68% CVP 16.  - Continue to diurese with lasix 80 IV bid - Once in NSR will need slow wean of milrinone and titration of GDMT. Will add spiro today.    2. A Fib RVR  - Started on amio drip. Remains fast - Will bolus amio today continue gtt at 60 - Will continue to stabilize and diurese today - Plan DC-CV tomorrow at 1pm (scheduled) - Continue Xarelto. (today is day #20 - started 12/17). He has not missed any doses   3. AKI - due to ATN/shock - improving with hemodynamic support  4. Shock liver - improving with hemodynamic support  5. Hypokalemia - supp K. Add spiro  6. Obesity/ Probable OSA - Body mass index is 40.72 kg/m. - will need outpatient sleep study  7. . ETOH  - Discussed need for cessation.    CRITICAL CARE Performed by: Arvilla Meres  Total critical care time: 40 minutes  Critical care time was exclusive of separately billable procedures and treating other patients.  Critical care was necessary to treat or prevent imminent or life-threatening deterioration.  Critical care was time spent personally by me (independent of midlevel providers or residents) on the following activities: development of treatment plan with patient and/or surrogate as well as nursing, discussions with consultants, evaluation of patient's response to treatment, examination of patient, obtaining history from patient or surrogate, ordering and performing treatments and interventions, ordering and review of laboratory studies, ordering and review of radiographic studies, pulse oximetry and re-evaluation of  patient's condition.    Length of Stay: 1   Arvilla Meres MD 05/14/2020, 8:33 AM  Advanced Heart Failure Team Pager [phone] (M-F; 7a - 4p)  Please contact [facility] for night-coverage after hours (4p -7a ) and weekends on [email or web address]

## 2020-05-14 NOTE — Plan of Care (Signed)
  Problem: Education: Goal: Knowledge of General Education information will improve Description: Including pain rating scale, medication(s)/side effects and non-pharmacologic comfort measures Outcome: Progressing   Problem: Health Behavior/Discharge Planning: Goal: Ability to manage health-related needs will improve Outcome: Progressing   Problem: Clinical Measurements: Goal: Ability to maintain clinical measurements within normal limits will improve Outcome: Progressing Goal: Will remain free from infection Outcome: Progressing Goal: Diagnostic test results will improve Outcome: Progressing Goal: Respiratory complications will improve Outcome: Progressing Goal: Cardiovascular complication will be avoided Outcome: Progressing   Problem: Activity: Goal: Risk for activity intolerance will decrease Outcome: Progressing   Problem: Nutrition: Goal: Adequate nutrition will be maintained Outcome: Progressing   Problem: Coping: Goal: Level of anxiety will decrease Outcome: Progressing   Problem: Elimination: Goal: Will not experience complications related to bowel motility Outcome: Progressing Goal: Will not experience complications related to urinary retention Outcome: Progressing   Problem: Pain Managment: Goal: General experience of comfort will improve Outcome: Progressing   Problem: Safety: Goal: Ability to remain free from injury will improve Outcome: Progressing   Problem: Skin Integrity: Goal: Risk for impaired skin integrity will decrease Outcome: Progressing   Problem: Education: Goal: Ability to demonstrate management of disease process will improve Outcome: Progressing Goal: Ability to verbalize understanding of medication therapies will improve Outcome: Progressing Goal: Individualized Educational Video(s) Outcome: Progressing   Problem: Activity: Goal: Capacity to carry out activities will improve Outcome: Progressing   Problem: Cardiac: Goal:  Ability to achieve and maintain adequate cardiopulmonary perfusion will improve Outcome: Progressing   Problem: Education: Goal: Knowledge of disease or condition will improve Outcome: Progressing Goal: Understanding of medication regimen will improve Outcome: Progressing Goal: Individualized Educational Video(s) Outcome: Progressing   Problem: Activity: Goal: Ability to tolerate increased activity will improve Outcome: Progressing   Problem: Cardiac: Goal: Ability to achieve and maintain adequate cardiopulmonary perfusion will improve Outcome: Progressing   Problem: Health Behavior/Discharge Planning: Goal: Ability to safely manage health-related needs after discharge will improve Outcome: Progressing   

## 2020-05-14 NOTE — Progress Notes (Signed)
  Echocardiogram 2D Echocardiogram has been performed.  Alexander [name] 05/14/2020, 8:16 AM

## 2020-05-15 ENCOUNTER — Inpatient Hospital Stay (HOSPITAL_COMMUNITY): Payer: BC Managed Care – PPO | Admitting: Anesthesiology

## 2020-05-15 ENCOUNTER — Encounter (HOSPITAL_COMMUNITY)
Admission: EM | Disposition: A | Discharge status: Home / Self Care | Payer: Self-pay | Source: Home / Self Care | Attending: Internal Medicine

## 2020-05-15 DIAGNOSIS — I4819 Other persistent atrial fibrillation: Principal | ICD-10-CM

## 2020-05-15 HISTORY — PX: CARDIOVERSION: SHX1299

## 2020-05-15 LAB — CBC
HCT: 42.6 % (ref 39.0–52.0)
Hemoglobin: 13.8 g/dL (ref 13.0–17.0)
MCH: 29.1 pg (ref 26.0–34.0)
MCHC: 32.4 g/dL (ref 30.0–36.0)
MCV: 89.9 fL (ref 80.0–100.0)
Platelets: 261 10*3/uL (ref 150–400)
RBC: 4.74 MIL/uL (ref 4.22–5.81)
RDW: 14 % (ref 11.5–15.5)
WBC: 9.2 10*3/uL (ref 4.0–10.5)
nRBC: 0 % (ref 0.0–0.2)

## 2020-05-15 LAB — COMPREHENSIVE METABOLIC PANEL
ALT: 86 U/L — ABNORMAL HIGH (ref 0–44)
AST: 41 U/L (ref 15–41)
Albumin: 3.6 g/dL (ref 3.5–5.0)
Alkaline Phosphatase: 44 U/L (ref 38–126)
Anion gap: 14 (ref 5–15)
BUN: 15 mg/dL (ref 6–20)
CO2: 27 mmol/L (ref 22–32)
Calcium: 7.8 mg/dL — ABNORMAL LOW (ref 8.9–10.3)
Chloride: 95 mmol/L — ABNORMAL LOW (ref 98–111)
Creatinine, Ser: 1.46 mg/dL — ABNORMAL HIGH (ref 0.61–1.24)
GFR, Estimated: >60 mL/min (ref >60)
Glucose, Bld: 279 mg/dL — ABNORMAL HIGH (ref 70–99)
Potassium: 3.3 mmol/L — ABNORMAL LOW (ref 3.5–5.1)
Sodium: 136 mmol/L (ref 135–145)
Total Bilirubin: 1 mg/dL (ref 0.3–1.2)
Total Protein: 5.8 g/dL — ABNORMAL LOW (ref 6.5–8.1)

## 2020-05-15 LAB — COOXEMETRY PANEL
Carboxyhemoglobin: 1.1 % (ref 0.5–1.5)
Carboxyhemoglobin: 1.3 % (ref 0.5–1.5)
Methemoglobin: 0.8 % (ref 0.0–1.5)
Methemoglobin: 0.8 % (ref 0.0–1.5)
O2 Saturation: 57.7 %
O2 Saturation: 68 %
Total hemoglobin: 13.9 g/dL (ref 12.0–16.0)
Total hemoglobin: 14.8 g/dL (ref 12.0–16.0)

## 2020-05-15 LAB — MAGNESIUM: Magnesium: 2.1 mg/dL (ref 1.7–2.4)

## 2020-05-15 SURGERY — CARDIOVERSION
Anesthesia: General

## 2020-05-15 MED ORDER — PHENYLEPHRINE HCL (PRESSORS) 10 MG/ML IV SOLN
INTRAVENOUS | Status: DC | PRN
  Administered 2020-05-15: 40 ug via INTRAVENOUS

## 2020-05-15 MED ORDER — SENNOSIDES-DOCUSATE SODIUM 8.6-50 MG PO TABS
2.0000 | ORAL_TABLET | Freq: Every day | ORAL | Status: DC
  Administered 2020-05-15 – 2020-05-16 (×2): 2 via ORAL
  Filled 2020-05-15 (×2): qty 2

## 2020-05-15 MED ORDER — LIDOCAINE HCL (CARDIAC) PF 100 MG/5ML IV SOSY
PREFILLED_SYRINGE | INTRAVENOUS | Status: DC | PRN
  Administered 2020-05-15: 60 mg via INTRATRACHEAL

## 2020-05-15 MED ORDER — PROPOFOL 10 MG/ML IV BOLUS
INTRAVENOUS | Status: DC | PRN
  Administered 2020-05-15: 100 mg via INTRAVENOUS

## 2020-05-15 MED ORDER — SODIUM CHLORIDE 0.9 % IV SOLN: INTRAVENOUS | Status: DC | PRN

## 2020-05-15 MED ORDER — POLYETHYLENE GLYCOL 3350 17 G PO PACK
17.0000 g | PACK | Freq: Every day | ORAL | Status: DC
  Filled 2020-05-15: qty 1

## 2020-05-15 MED ORDER — SACUBITRIL-VALSARTAN 24-26 MG PO TABS
1.0000 | ORAL_TABLET | Freq: Two times a day (BID) | ORAL | Status: DC
  Administered 2020-05-15: 1 via ORAL
  Filled 2020-05-15 (×3): qty 1

## 2020-05-15 MED ORDER — POTASSIUM CHLORIDE CRYS ER 20 MEQ PO TBCR
40.0000 meq | EXTENDED_RELEASE_TABLET | Freq: Two times a day (BID) | ORAL | Status: DC
  Administered 2020-05-15 – 2020-05-16 (×4): 40 meq via ORAL
  Filled 2020-05-15 (×4): qty 2

## 2020-05-15 NOTE — CV Procedure (Signed)
    DIRECT CURRENT CARDIOVERSION  NAME:  Alexander [name]   MRN: [id number] DOB:  Jun 13, 1975   ADMIT DATE: 05/13/2020   INDICATIONS: Atrial fibrillation    PROCEDURE:   Informed consent was obtained prior to the procedure. The risks, benefits and alternatives for the procedure were discussed and the patient comprehended these risks. Once an appropriate time out was taken, the patient had the defibrillator pads placed in the anterior and posterior position. The patient then underwent sedation by the anesthesia service. Once an appropriate level of sedation was achieved, the patient received a single biphasic, synchronized 200J shock with prompt conversion to sinus rhythm. No apparent complications.  Arvilla Meres, MD  8:20 AM

## 2020-05-15 NOTE — H&P (View-Only) (Signed)
Advanced Heart Failure Rounding Note   Subjective:    On milrinone 0.25 and IV amio. Remains in AF with RVR 130-150s.   Diuresed well. Weight down another pound. Co-ox 58%  Underwent successful DC-CV this am to NSR   Objective:   Weight Range:  Vital Signs:   Temp:  [98.4 F (36.9 C)-98.6 F (37 C)] 98.5 F (36.9 C) (01/07 0719) Pulse Rate:  [153] 153 (01/07 0719) Resp:  [19-39] 22 (01/07 0719) BP: (123-169)/(86-145) 156/109 (01/07 0719) SpO2:  [87 %-96 %] 96 % (01/07 0719) Weight:  [136.9 kg] 136.9 kg (01/07 0500) Last BM Date: 05/14/20  Weight change: Filed Weights   05/13/20 1641 05/14/20 0410 05/15/20 0500  Weight: (!) 140 kg (!) 137 kg (!) 136.9 kg    Intake/Output:   Intake/Output Summary (Last 24 hours) at 05/15/2020 0811 Last data filed at 05/15/2020 0802 Gross per 24 hour  Intake 1916.46 ml  Output 3277 ml  Net -1360.54 ml     Physical Exam: General:  Lying in bed No resp difficulty HEENT: normal Neck: supple. no JVD. Carotids 2+ bilat; no bruits. No lymphadenopathy or thryomegaly appreciated. Cor: PMI nondisplaced. Regular rate & rhythm. No rubs, gallops or murmurs. Lungs: clear Abdomen: obese soft, nontender, nondistended. No hepatosplenomegaly. No bruits or masses. Good bowel sounds. Extremities: no cyanosis, clubbing, rash, edema Neuro: alert & orientedx3, cranial nerves grossly intact. moves all 4 extremities w/o difficulty. Affect pleasant   Telemetry:  NSR 70-80s Personally reviewed   Labs: Basic Metabolic Panel: Recent Labs  Lab 05/13/20 1535 05/14/20 0022 05/14/20 1702  NA 137 132* 139  K 4.0 3.4* 3.3*  CL 103 96* 100  CO2 22 21* 27  GLUCOSE 116* 110* 110*  BUN 20 18 17   CREATININE 1.64* 1.53* 1.46*  CALCIUM 9.4 8.7* 9.0  MG  --  1.9  --     Liver Function Tests: Recent Labs  Lab 05/13/20 1535  AST 76*  ALT 114*  ALKPHOS 55  BILITOT 1.4*  PROT 6.6  ALBUMIN 4.2   Recent Labs  Lab 05/13/20 1535  LIPASE 48    No results for input(s): AMMONIA in the last 168 hours.  CBC: Recent Labs  Lab 05/13/20 1535 05/13/20 2006 05/14/20 0022 05/15/20 0420  WBC 12.7* 12.1* 10.9* 9.2  NEUTROABS 9.9*  --   --   --   HGB 15.9 15.3 15.8 13.8  HCT 46.4 45.7 47.8 42.6  MCV 89.2 88.1 88.0 89.9  PLT 345 289 315 261    Cardiac Enzymes: No results for input(s): CKTOTAL, CKMB, CKMBINDEX, TROPONINI in the last 168 hours.  BNP: BNP (last 3 results) No results for input(s): BNP in the last 8760 hours.  ProBNP (last 3 results) No results for input(s): PROBNP in the last 8760 hours.    Other results:  Imaging: DG CHEST PORT 1 VIEW  Result Date: 05/13/2020 CLINICAL DATA:  PICC placement EXAM: PORTABLE CHEST 1 VIEW COMPARISON:  04/25/2020 FINDINGS: Single frontal view of the chest demonstrates stable enlargement the cardiac silhouette. Bibasilar veiling opacities are noted consistent with consolidation and small effusions. No pneumothorax. Right-sided PICC tip projects over the superior vena cava. IMPRESSION: 1. Right-sided PICC as above. 2. Bibasilar consolidation and small effusions. Electronically Signed   By: 04/27/2020 M.D.   On: 05/13/2020 20:37   ECHOCARDIOGRAM COMPLETE  Result Date: 05/14/2020    ECHOCARDIOGRAM REPORT   Patient Name:   Alexander [name] Date of Exam: 05/14/2020 Medical Rec #:  [id number]     Height:       73.0 in Accession #:    [id number]    Weight:       302.0 lb Date of Birth:  03/23/1976     BSA:          2.564 m Patient Age:    44 years      BP:           110/82 mmHg Patient Gender: M             HR:           157 bpm. Exam Location:  Inpatient Procedure: 2D Echo, Cardiac Doppler, Color Doppler and Intracardiac            Opacification Agent STAT ECHO Indications:    CHF-Acute Systolic I50.21  History:        Patient has prior history of Echocardiogram examinations, most                 recent 04/24/2020. Arrythmias:Atrial Fibrillation; Risk                 Factors:Hypertension,  Dyslipidemia and Non-Smoker.  Sonographer:    Renella Cunas RDCS Referring Phys: [phone] AMY D CLEGG IMPRESSIONS  1. There is a significant difference since the prior study, LVEF is now < 20%, RVEF is at least mildly decreased. No apical thrombus is seen on Definity echocontrast images. LVEF might be underestimated by atrial fibrillation with rapid ventricular rate  during acquisition.  2. Left ventricular ejection fraction, by estimation, is <20%. The left ventricle has severely decreased function. The left ventricle demonstrates global hypokinesis. The left ventricular internal cavity size was moderately dilated. There is mild concentric left ventricular hypertrophy. Left ventricular diastolic function could not be evaluated.  3. Right ventricular systolic function is mildly reduced. The right ventricular size is mildly enlarged.  4. Left atrial size was moderately dilated.  5. Right atrial size was mildly dilated.  6. A small pericardial effusion is present. The pericardial effusion is posterior to the left ventricle.  7. The mitral valve is normal in structure. Mild mitral valve regurgitation. No evidence of mitral stenosis.  8. The aortic valve is normal in structure. Aortic valve regurgitation is not visualized. No aortic stenosis is present.  9. The inferior vena cava is dilated in size with >50% respiratory variability, suggesting right atrial pressure of 8 mmHg. FINDINGS  Left Ventricle: Left ventricular ejection fraction, by estimation, is <20%. The left ventricle has severely decreased function. The left ventricle demonstrates global hypokinesis. Definity contrast agent was given IV to delineate the left ventricular endocardial borders. The left ventricular internal cavity size was moderately dilated. There is mild concentric left ventricular hypertrophy. Left ventricular diastolic function could not be evaluated due to atrial fibrillation. Left ventricular diastolic function could not be evaluated. Right  Ventricle: The right ventricular size is mildly enlarged. No increase in right ventricular wall thickness. Right ventricular systolic function is mildly reduced. Left Atrium: Left atrial size was moderately dilated. Right Atrium: Right atrial size was mildly dilated. Pericardium: A small pericardial effusion is present. The pericardial effusion is posterior to the left ventricle. Mitral Valve: The mitral valve is normal in structure. Mild mitral valve regurgitation. No evidence of mitral valve stenosis. Tricuspid Valve: The tricuspid valve is normal in structure. Tricuspid valve regurgitation is mild . No evidence of tricuspid stenosis. Aortic Valve: The aortic valve is normal in structure. Aortic valve regurgitation is not visualized. No  aortic stenosis is present. Pulmonic Valve: The pulmonic valve was normal in structure. Pulmonic valve regurgitation is not visualized. No evidence of pulmonic stenosis. Aorta: The aortic root is normal in size and structure. Venous: The inferior vena cava is dilated in size with greater than 50% respiratory variability, suggesting right atrial pressure of 8 mmHg. IAS/Shunts: No atrial level shunt detected by color flow Doppler.  LEFT VENTRICLE PLAX 2D LVIDd:         6.10 cm LVIDs:         5.40 cm LV PW:         1.00 cm LV IVS:        1.00 cm LVOT diam:     2.30 cm LVOT Area:     4.15 cm  LEFT ATRIUM             Index       RIGHT ATRIUM           Index LA diam:        4.50 cm 1.76 cm/m  RA Area:     21.70 cm LA Vol (A2C):   48.5 ml 18.92 ml/m RA Volume:   73.10 ml  28.51 ml/m LA Vol (A4C):   64.7 ml 25.24 ml/m LA Biplane Vol: 60.9 ml 23.76 ml/m   AORTA Ao Root diam: 3.60 cm Ao Asc diam:  3.80 cm  SHUNTS Systemic Diam: 2.30 cm Tobias Alexander MD Electronically signed by Tobias Alexander MD Signature Date/Time: 05/14/2020/2:26:34 PM    Final    [place] EKG SITE RITE  Result Date: 05/13/2020 If Site Rite image not attached, placement could not be confirmed due to current cardiac  rhythm.    Medications:     Scheduled Medications: . [MAR Hold] allopurinol  100 mg Oral Daily  . [MAR Hold] buPROPion  300 mg Oral Daily  . [MAR Hold] Chlorhexidine Gluconate Cloth  6 each Topical Daily  . [MAR Hold] digoxin  0.125 mg Oral Daily  . [MAR Hold] furosemide  80 mg Intravenous BID  . [MAR Hold] potassium chloride  20 mEq Oral BID  . [MAR Hold] rivaroxaban  20 mg Oral Q supper  . [MAR Hold] sodium chloride flush  10-40 mL Intracatheter Q12H  . [MAR Hold] sodium chloride flush  3 mL Intravenous Q12H  . [MAR Hold] spironolactone  25 mg Oral Daily    Infusions: . [MAR Hold] sodium chloride    . sodium chloride    . amiodarone 60 mg/hr (05/15/20 0708)  . milrinone 0.25 mcg/kg/min (05/15/20 0700)  . [MAR Hold] norepinephrine (LEVOPHED) Adult infusion      PRN Medications: [MAR Hold] sodium chloride, [MAR Hold] acetaminophen, [MAR Hold] alum & mag hydroxide-simeth, [MAR Hold] calcium carbonate, [MAR Hold] ondansetron (ZOFRAN) IV, [MAR Hold] sodium chloride flush, [MAR Hold] sodium chloride flush   Assessment/Plan:    1. A/C Systolic Heart Failure -> cardiogenic shock. -Recently diagnosed reduced EF back in December with EF 35-40%. 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 Can defer ischemic eval for now. Cath eventually - On milrinone 0.25. Co-ox improved to 58%  - Still mildly volume overloaded. Continue IV lasix one more day - Now that he is in NSR will need slow wean of milrinone and titration of GDMT.  - Continue dig as HR tolerates - Titrate spiro to 25 - Add losartan or Entresto later today or tomorrow as he recovers from DC-CV  2. A Fib RVR  -  s/p DC-CV this am (1/7) - Continue IV amio for now.  - Continue Xarelto. (today is day #20 - started 12/17).  3. AKI - due to ATN/shock - improving with hemodynamic support  4. Shock liver - improving with hemodynamic support  5.  Hypokalemia - BMET pending   6. Obesity/ Probable OSA - Body mass index is 40.72 kg/m. - will need outpatient sleep study  7. . ETOH  - Discussed need for cessation.    CRITICAL CARE Performed by: Glori Bickers  Total critical care time: 40 minutes  Critical care time was exclusive of separately billable procedures and treating other patients.  Critical care was necessary to treat or prevent imminent or life-threatening deterioration.  Critical care was time spent personally by me (independent of midlevel providers or residents) on the following activities: development of treatment plan with patient and/or surrogate as well as nursing, discussions with consultants, evaluation of patient's response to treatment, examination of patient, obtaining history from patient or surrogate, ordering and performing treatments and interventions, ordering and review of laboratory studies, ordering and review of radiographic studies, pulse oximetry and re-evaluation of patient's condition.    Length of Stay: 2   Glori Bickers MD 05/15/2020, 8:11 AM  Advanced Heart Failure Team Pager [phone] (M-F; [place])  Please contact [facility] for night-coverage after hours (4p -7a ) and weekends on [email or web address]

## 2020-05-15 NOTE — Progress Notes (Signed)
Electrophysiology Rounding Note  Patient Name: Alexander [name] Date of Encounter: 05/15/2020  Primary Cardiologist: No primary care provider on file. Electrophysiologist: Lanier Prude, MD   Subjective   Pt underwent DCC this am to NSR.   No new concerns or complaints.   Inpatient Medications    Scheduled Meds: . allopurinol  100 mg Oral Daily  . buPROPion  300 mg Oral Daily  . Chlorhexidine Gluconate Cloth  6 each Topical Daily  . digoxin  0.125 mg Oral Daily  . furosemide  80 mg Intravenous BID  . potassium chloride  20 mEq Oral BID  . rivaroxaban  20 mg Oral Q supper  . sodium chloride flush  10-40 mL Intracatheter Q12H  . sodium chloride flush  3 mL Intravenous Q12H  . spironolactone  25 mg Oral Daily   Continuous Infusions: . sodium chloride    . sodium chloride    . amiodarone 60 mg/hr (05/15/20 0708)  . milrinone 0.25 mcg/kg/min (05/15/20 0700)  . norepinephrine (LEVOPHED) Adult infusion     PRN Meds: sodium chloride, acetaminophen, alum & mag hydroxide-simeth, calcium carbonate, ondansetron (ZOFRAN) IV, sodium chloride flush, sodium chloride flush   Vital Signs    Vitals:   05/15/20 0808 05/15/20 0818 05/15/20 0832 05/15/20 0900  BP: (!) 140/92 119/82 117/89 (!) 132/100  Pulse: 89 91 87   Resp: 19 19 (!) 23 (!) 25  Temp: 97.9 F (36.6 C)     TempSrc: Temporal     SpO2: 99% 93% 93% 94%  Weight:      Height:        Intake/Output Summary (Last 24 hours) at 05/15/2020 1117 Last data filed at 05/15/2020 0802 Gross per 24 hour  Intake 1622.26 ml  Output 3275 ml  Net -1652.74 ml   Filed Weights   05/13/20 1641 05/14/20 0410 05/15/20 0500  Weight: (!) 140 kg (!) 137 kg (!) 136.9 kg    Physical Exam    GEN- NAD, alert and oriented x 3 today.   Head- normocephalic, atraumatic Eyes-  Sclera clear, conjunctiva pink Ears- hearing intact Oropharynx- clear Neck- supple Lungs- Clear to ausculation bilaterally, normal work of breathing Heart- Regular  rate and rhythm, no murmurs, rubs or gallops GI- soft, NT, ND, + BS Extremities- no clubbing or cyanosis. No edema Skin- no rash or lesion Psych- euthymic mood, full affect Neuro- strength and sensation are intact  Labs    CBC Recent Labs    05/13/20 1535 05/13/20 2006 05/14/20 0022 05/15/20 0420  WBC 12.7*   < > 10.9* 9.2  NEUTROABS 9.9*  --   --   --   HGB 15.9   < > 15.8 13.8  HCT 46.4   < > 47.8 42.6  MCV 89.2   < > 88.0 89.9  PLT 345   < > 315 261   < > = values in this interval not displayed.   Basic Metabolic Panel Recent Labs    01/60/10 0022 05/14/20 1702 05/15/20 0420  NA 132* 139 136  K 3.4* 3.3* 3.3*  CL 96* 100 95*  CO2 21* 27 27  GLUCOSE 110* 110* 279*  BUN 18 17 15   CREATININE 1.53* 1.46* 1.46*  CALCIUM 8.7* 9.0 7.8*  MG 1.9  --  2.1   Liver Function Tests Recent Labs    05/13/20 1535 05/15/20 0420  AST 76* 41  ALT 114* 86*  ALKPHOS 55 44  BILITOT 1.4* 1.0  PROT 6.6 5.8*  ALBUMIN 4.2  3.6   Recent Labs    05/13/20 1535  LIPASE 48   Cardiac Enzymes No results for input(s): CKTOTAL, CKMB, CKMBINDEX, TROPONINI in the last 72 hours.   Telemetry    NSR 70-80s now s/p [facility] (personally reviewed)  Radiology    DG CHEST PORT 1 VIEW  Result Date: 05/13/2020 CLINICAL DATA:  PICC placement EXAM: PORTABLE CHEST 1 VIEW COMPARISON:  04/25/2020 FINDINGS: Single frontal view of the chest demonstrates stable enlargement the cardiac silhouette. Bibasilar veiling opacities are noted consistent with consolidation and small effusions. No pneumothorax. Right-sided PICC tip projects over the superior vena cava. IMPRESSION: 1. Right-sided PICC as above. 2. Bibasilar consolidation and small effusions. Electronically Signed   By: Sharlet Salina M.D.   On: 05/13/2020 20:37   ECHOCARDIOGRAM COMPLETE  Result Date: 05/14/2020    ECHOCARDIOGRAM REPORT   Patient Name:   Alexander [name] Date of Exam: 05/14/2020 Medical Rec #:  [id number]     Height:       73.0 in  Accession #:    [id number]    Weight:       302.0 lb Date of Birth:  1976/03/04     BSA:          2.564 m Patient Age:    45 years      BP:           110/82 mmHg Patient Gender: M             HR:           157 bpm. Exam Location:  Inpatient Procedure: 2D Echo, Cardiac Doppler, Color Doppler and Intracardiac            Opacification Agent STAT ECHO Indications:    CHF-Acute Systolic I50.21  History:        Patient has prior history of Echocardiogram examinations, most                 recent 04/24/2020. Arrythmias:Atrial Fibrillation; Risk                 Factors:Hypertension, Dyslipidemia and Non-Smoker.  Sonographer:    Renella Cunas RDCS Referring Phys: [phone] AMY D CLEGG IMPRESSIONS  1. There is a significant difference since the prior study, LVEF is now < 20%, RVEF is at least mildly decreased. No apical thrombus is seen on Definity echocontrast images. LVEF might be underestimated by atrial fibrillation with rapid ventricular rate  during acquisition.  2. Left ventricular ejection fraction, by estimation, is <20%. The left ventricle has severely decreased function. The left ventricle demonstrates global hypokinesis. The left ventricular internal cavity size was moderately dilated. There is mild concentric left ventricular hypertrophy. Left ventricular diastolic function could not be evaluated.  3. Right ventricular systolic function is mildly reduced. The right ventricular size is mildly enlarged.  4. Left atrial size was moderately dilated.  5. Right atrial size was mildly dilated.  6. A small pericardial effusion is present. The pericardial effusion is posterior to the left ventricle.  7. The mitral valve is normal in structure. Mild mitral valve regurgitation. No evidence of mitral stenosis.  8. The aortic valve is normal in structure. Aortic valve regurgitation is not visualized. No aortic stenosis is present.  9. The inferior vena cava is dilated in size with >50% respiratory variability, suggesting right  atrial pressure of 8 mmHg. FINDINGS  Left Ventricle: Left ventricular ejection fraction, by estimation, is <20%. The left ventricle has severely decreased function. The left ventricle demonstrates global  hypokinesis. Definity contrast agent was given IV to delineate the left ventricular endocardial borders. The left ventricular internal cavity size was moderately dilated. There is mild concentric left ventricular hypertrophy. Left ventricular diastolic function could not be evaluated due to atrial fibrillation. Left ventricular diastolic function could not be evaluated. Right Ventricle: The right ventricular size is mildly enlarged. No increase in right ventricular wall thickness. Right ventricular systolic function is mildly reduced. Left Atrium: Left atrial size was moderately dilated. Right Atrium: Right atrial size was mildly dilated. Pericardium: A small pericardial effusion is present. The pericardial effusion is posterior to the left ventricle. Mitral Valve: The mitral valve is normal in structure. Mild mitral valve regurgitation. No evidence of mitral valve stenosis. Tricuspid Valve: The tricuspid valve is normal in structure. Tricuspid valve regurgitation is mild . No evidence of tricuspid stenosis. Aortic Valve: The aortic valve is normal in structure. Aortic valve regurgitation is not visualized. No aortic stenosis is present. Pulmonic Valve: The pulmonic valve was normal in structure. Pulmonic valve regurgitation is not visualized. No evidence of pulmonic stenosis. Aorta: The aortic root is normal in size and structure. Venous: The inferior vena cava is dilated in size with greater than 50% respiratory variability, suggesting right atrial pressure of 8 mmHg. IAS/Shunts: No atrial level shunt detected by color flow Doppler.  LEFT VENTRICLE PLAX 2D LVIDd:         6.10 cm LVIDs:         5.40 cm LV PW:         1.00 cm LV IVS:        1.00 cm LVOT diam:     2.30 cm LVOT Area:     4.15 cm  LEFT ATRIUM              Index       RIGHT ATRIUM           Index LA diam:        4.50 cm 1.76 cm/m  RA Area:     21.70 cm LA Vol (A2C):   48.5 ml 18.92 ml/m RA Volume:   73.10 ml  28.51 ml/m LA Vol (A4C):   64.7 ml 25.24 ml/m LA Biplane Vol: 60.9 ml 23.76 ml/m   AORTA Ao Root diam: 3.60 cm Ao Asc diam:  3.80 cm  SHUNTS Systemic Diam: 2.30 cm Tobias Alexander MD Electronically signed by Tobias Alexander MD Signature Date/Time: 05/14/2020/2:26:34 PM    Final    [place] EKG SITE RITE  Result Date: 05/13/2020 If Site Rite image not attached, placement could not be confirmed due to current cardiac rhythm.   Patient Profile     45 y.o. male obesity, HTN, hyperlipidemia, adjustment disorder, anxiety, and A fib.  AFib noted initially last month, was hospitalized for AFib RVR and new acute CHF last month discharged on a/c and BB/ARB with plans to visit DCCV after uninterrupted a/c. He saw Dre. Lalla Brothers yesterday not feeling well, weak, nauseous, and back in RVR, suspect low output failure and referred to the ER with thought of urgent DCCV and HF admission.  He was admitted by the HF team, not felt stable enough for DCCV, started on milrinone and amiodarone, IV diuretic  AFib Hx Diagnosed Dec 2021 AAD None  Xarelto started 04/25/20, he reports no missed doses since started  Assessment & Plan    1. 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 HF team planning to wean now in NSR.     Overall feeling better, hopefully will continue to improve in NSR.      C/w AHF team  2. Persistent AFib CHA2DS2Vasc is 2, on Xarelto, appropriately dosed Pt reports no missed doses since start  Underwent DDC this am and now in NSR.  K 3.3  Mg 2.1.  Supp K to Keep K > 4.0 and Mg > 2.0   3. HTN Systolic BP 160-109N.  HF team to adjust meds now as milrinone weans and in NSR.   4. AKI Cr 1.46 this  am.   For questions or updates, please contact [place] Please consult [email or web address] for contact info under Cardiology/STEMI.  Signed, Shirley Friar, PA-C  05/15/2020, 11:17 AM

## 2020-05-15 NOTE — Plan of Care (Signed)
  Problem: Education: Goal: Knowledge of General Education information will improve Description: Including pain rating scale, medication(s)/side effects and non-pharmacologic comfort measures Outcome: Progressing   Problem: Health Behavior/Discharge Planning: Goal: Ability to manage health-related needs will improve Outcome: Progressing   Problem: Clinical Measurements: Goal: Ability to maintain clinical measurements within normal limits will improve Outcome: Progressing Goal: Will remain free from infection Outcome: Progressing Goal: Diagnostic test results will improve Outcome: Progressing Goal: Respiratory complications will improve Outcome: Progressing Goal: Cardiovascular complication will be avoided Outcome: Progressing   Problem: Activity: Goal: Risk for activity intolerance will decrease Outcome: Progressing   Problem: Nutrition: Goal: Adequate nutrition will be maintained Outcome: Progressing   Problem: Coping: Goal: Level of anxiety will decrease Outcome: Progressing   Problem: Elimination: Goal: Will not experience complications related to bowel motility Outcome: Progressing Goal: Will not experience complications related to urinary retention Outcome: Progressing   Problem: Pain Managment: Goal: General experience of comfort will improve Outcome: Progressing   Problem: Safety: Goal: Ability to remain free from injury will improve Outcome: Progressing   Problem: Skin Integrity: Goal: Risk for impaired skin integrity will decrease Outcome: Progressing   Problem: Education: Goal: Ability to demonstrate management of disease process will improve Outcome: Progressing Goal: Ability to verbalize understanding of medication therapies will improve Outcome: Progressing Goal: Individualized Educational Video(s) Outcome: Progressing   Problem: Activity: Goal: Capacity to carry out activities will improve Outcome: Progressing   Problem: Cardiac: Goal:  Ability to achieve and maintain adequate cardiopulmonary perfusion will improve Outcome: Progressing   Problem: Education: Goal: Knowledge of disease or condition will improve Outcome: Progressing Goal: Understanding of medication regimen will improve Outcome: Progressing Goal: Individualized Educational Video(s) Outcome: Progressing   Problem: Activity: Goal: Ability to tolerate increased activity will improve Outcome: Progressing   Problem: Cardiac: Goal: Ability to achieve and maintain adequate cardiopulmonary perfusion will improve Outcome: Progressing   Problem: Health Behavior/Discharge Planning: Goal: Ability to safely manage health-related needs after discharge will improve Outcome: Progressing   

## 2020-05-15 NOTE — Transfer of Care (Signed)
Immediate Anesthesia Transfer of Care Note  Patient: Alexander [name]  Procedure(s) Performed: CARDIOVERSION (N/A )  Patient Location: Endoscopy Unit  Anesthesia Type:General  Level of Consciousness: awake and patient cooperative  Airway & Oxygen Therapy: Patient Spontanous Breathing and Patient connected to face mask oxygen  Post-op Assessment: Report given to RN and Post -op Vital signs reviewed and stable  Post vital signs: Reviewed and stable  Last Vitals:  Vitals Value Taken Time  BP 117/85   Temp    Pulse 88   Resp 18   SpO2 99     Last Pain:  Vitals:   05/15/20 0719  TempSrc: Oral  PainSc: 0-No pain         Complications: No complications documented.

## 2020-05-15 NOTE — Progress Notes (Addendum)
Advanced Heart Failure Rounding Note   Subjective:    On milrinone 0.25 and IV amio. Remains in AF with RVR 130-150s.   Diuresed well. Weight down another pound. Co-ox 58%  Underwent successful DC-CV this am to NSR   Objective:   Weight Range:  Vital Signs:   Temp:  [98.4 F (36.9 C)-98.6 F (37 C)] 98.5 F (36.9 C) (01/07 0719) Pulse Rate:  [153] 153 (01/07 0719) Resp:  [19-39] 22 (01/07 0719) BP: (123-169)/(86-145) 156/109 (01/07 0719) SpO2:  [87 %-96 %] 96 % (01/07 0719) Weight:  [136.9 kg] 136.9 kg (01/07 0500) Last BM Date: 05/14/20  Weight change: Filed Weights   05/13/20 1641 05/14/20 0410 05/15/20 0500  Weight: (!) 140 kg (!) 137 kg (!) 136.9 kg    Intake/Output:   Intake/Output Summary (Last 24 hours) at 05/15/2020 0811 Last data filed at 05/15/2020 0802 Gross per 24 hour  Intake 1916.46 ml  Output 3277 ml  Net -1360.54 ml     Physical Exam: General:  Lying in bed No resp difficulty HEENT: normal Neck: supple. no JVD. Carotids 2+ bilat; no bruits. No lymphadenopathy or thryomegaly appreciated. Cor: PMI nondisplaced. Regular rate & rhythm. No rubs, gallops or murmurs. Lungs: clear Abdomen: obese soft, nontender, nondistended. No hepatosplenomegaly. No bruits or masses. Good bowel sounds. Extremities: no cyanosis, clubbing, rash, edema Neuro: alert & orientedx3, cranial nerves grossly intact. moves all 4 extremities w/o difficulty. Affect pleasant   Telemetry:  NSR 70-80s Personally reviewed   Labs: Basic Metabolic Panel: Recent Labs  Lab 05/13/20 1535 05/14/20 0022 05/14/20 1702  NA 137 132* 139  K 4.0 3.4* 3.3*  CL 103 96* 100  CO2 22 21* 27  GLUCOSE 116* 110* 110*  BUN 20 18 17   CREATININE 1.64* 1.53* 1.46*  CALCIUM 9.4 8.7* 9.0  MG  --  1.9  --     Liver Function Tests: Recent Labs  Lab 05/13/20 1535  AST 76*  ALT 114*  ALKPHOS 55  BILITOT 1.4*  PROT 6.6  ALBUMIN 4.2   Recent Labs  Lab 05/13/20 1535  LIPASE 48    No results for input(s): AMMONIA in the last 168 hours.  CBC: Recent Labs  Lab 05/13/20 1535 05/13/20 2006 05/14/20 0022 05/15/20 0420  WBC 12.7* 12.1* 10.9* 9.2  NEUTROABS 9.9*  --   --   --   HGB 15.9 15.3 15.8 13.8  HCT 46.4 45.7 47.8 42.6  MCV 89.2 88.1 88.0 89.9  PLT 345 289 315 261    Cardiac Enzymes: No results for input(s): CKTOTAL, CKMB, CKMBINDEX, TROPONINI in the last 168 hours.  BNP: BNP (last 3 results) No results for input(s): BNP in the last 8760 hours.  ProBNP (last 3 results) No results for input(s): PROBNP in the last 8760 hours.    Other results:  Imaging: DG CHEST PORT 1 VIEW  Result Date: 05/13/2020 CLINICAL DATA:  PICC placement EXAM: PORTABLE CHEST 1 VIEW COMPARISON:  04/25/2020 FINDINGS: Single frontal view of the chest demonstrates stable enlargement the cardiac silhouette. Bibasilar veiling opacities are noted consistent with consolidation and small effusions. No pneumothorax. Right-sided PICC tip projects over the superior vena cava. IMPRESSION: 1. Right-sided PICC as above. 2. Bibasilar consolidation and small effusions. Electronically Signed   By: 04/27/2020 M.D.   On: 05/13/2020 20:37   ECHOCARDIOGRAM COMPLETE  Result Date: 05/14/2020    ECHOCARDIOGRAM REPORT   Patient Name:   Alexander Alexander Date of Exam: 05/14/2020 Medical Rec #:  [id number]     Height:       73.0 in Accession #:    [id number]    Weight:       302.0 lb Date of Birth:  03/23/1976     BSA:          2.564 m Patient Age:    44 years      BP:           110/82 mmHg Patient Gender: M             HR:           157 bpm. Exam Location:  Inpatient Procedure: 2D Echo, Cardiac Doppler, Color Doppler and Intracardiac            Opacification Agent STAT ECHO Indications:    CHF-Acute Systolic I50.21  History:        Patient has prior history of Echocardiogram examinations, most                 recent 04/24/2020. Arrythmias:Atrial Fibrillation; Risk                 Factors:Hypertension,  Dyslipidemia and Non-Smoker.  Sonographer:    Renella Cunas RDCS Referring Phys: [phone] AMY D CLEGG IMPRESSIONS  1. There is a significant difference since the prior study, LVEF is now < 20%, RVEF is at least mildly decreased. No apical thrombus is seen on Definity echocontrast images. LVEF might be underestimated by atrial fibrillation with rapid ventricular rate  during acquisition.  2. Left ventricular ejection fraction, by estimation, is <20%. The left ventricle has severely decreased function. The left ventricle demonstrates global hypokinesis. The left ventricular internal cavity size was moderately dilated. There is mild concentric left ventricular hypertrophy. Left ventricular diastolic function could not be evaluated.  3. Right ventricular systolic function is mildly reduced. The right ventricular size is mildly enlarged.  4. Left atrial size was moderately dilated.  5. Right atrial size was mildly dilated.  6. A small pericardial effusion is present. The pericardial effusion is posterior to the left ventricle.  7. The mitral valve is normal in structure. Mild mitral valve regurgitation. No evidence of mitral stenosis.  8. The aortic valve is normal in structure. Aortic valve regurgitation is not visualized. No aortic stenosis is present.  9. The inferior vena cava is dilated in size with >50% respiratory variability, suggesting right atrial pressure of 8 mmHg. FINDINGS  Left Ventricle: Left ventricular ejection fraction, by estimation, is <20%. The left ventricle has severely decreased function. The left ventricle demonstrates global hypokinesis. Definity contrast agent was given IV to delineate the left ventricular endocardial borders. The left ventricular internal cavity size was moderately dilated. There is mild concentric left ventricular hypertrophy. Left ventricular diastolic function could not be evaluated due to atrial fibrillation. Left ventricular diastolic function could not be evaluated. Right  Ventricle: The right ventricular size is mildly enlarged. No increase in right ventricular wall thickness. Right ventricular systolic function is mildly reduced. Left Atrium: Left atrial size was moderately dilated. Right Atrium: Right atrial size was mildly dilated. Pericardium: A small pericardial effusion is present. The pericardial effusion is posterior to the left ventricle. Mitral Valve: The mitral valve is normal in structure. Mild mitral valve regurgitation. No evidence of mitral valve stenosis. Tricuspid Valve: The tricuspid valve is normal in structure. Tricuspid valve regurgitation is mild . No evidence of tricuspid stenosis. Aortic Valve: The aortic valve is normal in structure. Aortic valve regurgitation is not visualized. No  aortic stenosis is present. Pulmonic Valve: The pulmonic valve was normal in structure. Pulmonic valve regurgitation is not visualized. No evidence of pulmonic stenosis. Aorta: The aortic root is normal in size and structure. Venous: The inferior vena cava is dilated in size with greater than 50% respiratory variability, suggesting right atrial pressure of 8 mmHg. IAS/Shunts: No atrial level shunt detected by color flow Doppler.  LEFT VENTRICLE PLAX 2D LVIDd:         6.10 cm LVIDs:         5.40 cm LV PW:         1.00 cm LV IVS:        1.00 cm LVOT diam:     2.30 cm LVOT Area:     4.15 cm  LEFT ATRIUM             Index       RIGHT ATRIUM           Index LA diam:        4.50 cm 1.76 cm/m  RA Area:     21.70 cm LA Vol (A2C):   48.5 ml 18.92 ml/m RA Volume:   73.10 ml  28.51 ml/m LA Vol (A4C):   64.7 ml 25.24 ml/m LA Biplane Vol: 60.9 ml 23.76 ml/m   AORTA Ao Root diam: 3.60 cm Ao Asc diam:  3.80 cm  SHUNTS Systemic Diam: 2.30 cm Alexander Alexander MD Electronically signed by Alexander Alexander MD Signature Date/Time: 05/14/2020/2:26:34 PM    Final    [place] EKG SITE RITE  Result Date: 05/13/2020 If Site Rite image not attached, placement could not be confirmed due to current cardiac  rhythm.    Medications:     Scheduled Medications: . [MAR Hold] allopurinol  100 mg Oral Daily  . [MAR Hold] buPROPion  300 mg Oral Daily  . [MAR Hold] Chlorhexidine Gluconate Cloth  6 each Topical Daily  . [MAR Hold] digoxin  0.125 mg Oral Daily  . [MAR Hold] furosemide  80 mg Intravenous BID  . [MAR Hold] potassium chloride  20 mEq Oral BID  . [MAR Hold] rivaroxaban  20 mg Oral Q supper  . [MAR Hold] sodium chloride flush  10-40 mL Intracatheter Q12H  . [MAR Hold] sodium chloride flush  3 mL Intravenous Q12H  . [MAR Hold] spironolactone  25 mg Oral Daily    Infusions: . [MAR Hold] sodium chloride    . sodium chloride    . amiodarone 60 mg/hr (05/15/20 0708)  . milrinone 0.25 mcg/kg/min (05/15/20 0700)  . [MAR Hold] norepinephrine (LEVOPHED) Adult infusion      PRN Medications: [MAR Hold] sodium chloride, [MAR Hold] acetaminophen, [MAR Hold] alum & mag hydroxide-simeth, [MAR Hold] calcium carbonate, [MAR Hold] ondansetron (ZOFRAN) IV, [MAR Hold] sodium chloride flush, [MAR Hold] sodium chloride flush   Assessment/Plan:    1. A/C Systolic Heart Failure -> cardiogenic shock. -Recently diagnosed reduced EF back in December with EF 35-40%. 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 Can defer ischemic eval for now. Cath eventually - On milrinone 0.25. Co-ox improved to 58%  - Still mildly volume overloaded. Continue IV lasix one more day - Now that he is in NSR will need slow wean of milrinone and titration of GDMT.  - Continue dig as HR tolerates - Titrate spiro to 25 - Add losartan or Entresto later today or tomorrow as he recovers from DC-CV  2. A Fib RVR  -  s/p DC-CV this am (1/7) - Continue IV amio for now.  - Continue Xarelto.  3. AKI - due to ATN/shock - improving with hemodynamic support  4. Shock liver - improving with hemodynamic support  5. Hypokalemia - BMET pending   6. Obesity/  Probable OSA - Body mass index is 40.72 kg/m. - will need outpatient sleep study  7. . ETOH  - Discussed need for cessation.    CRITICAL CARE Performed by: Glori Bickers  Total critical care time: 40 minutes  Critical care time was exclusive of separately billable procedures and treating other patients.  Critical care was necessary to treat or prevent imminent or life-threatening deterioration.  Critical care was time spent personally by me (independent of midlevel providers or residents) on the following activities: development of treatment plan with patient and/or surrogate as well as nursing, discussions with consultants, evaluation of patient's response to treatment, examination of patient, obtaining history from patient or surrogate, ordering and performing treatments and interventions, ordering and review of laboratory studies, ordering and review of radiographic studies, pulse oximetry and re-evaluation of patient's condition.    Length of Stay: 2   Glori Bickers MD 05/15/2020, 8:11 AM  Advanced Heart Failure Team Pager [phone] (M-F; [place])  Please contact [facility] for night-coverage after hours (4p -7a ) and weekends on [email or web address]

## 2020-05-15 NOTE — Interval H&P Note (Signed)
History and Physical Interval Note:  05/15/2020 8:19 AM  Alexander [name]  has presented today for surgery, with the diagnosis of Afib/Aflutter.  The various methods of treatment have been discussed with the patient and family. After consideration of risks, benefits and other options for treatment, the patient has consented to  Procedure(s): CARDIOVERSION (N/A) as a surgical intervention.  The patient's history has been reviewed, patient examined, no change in status, stable for surgery.  I have reviewed the patient's chart and labs.  Questions were answered to the patient's satisfaction.     Shalisha Clausing

## 2020-05-15 NOTE — Anesthesia Postprocedure Evaluation (Signed)
Anesthesia Post Note  Patient: Alexander [name]  Procedure(s) Performed: CARDIOVERSION (N/A )     Patient location during evaluation: PACU Anesthesia Type: General Level of consciousness: awake and alert, oriented and patient cooperative Pain management: pain level controlled Vital Signs Assessment: post-procedure vital signs reviewed and stable Respiratory status: spontaneous breathing, nonlabored ventilation and respiratory function stable Cardiovascular status: blood pressure returned to baseline and stable Postop Assessment: no apparent nausea or vomiting Anesthetic complications: no   No complications documented.  Last Vitals:  Vitals:   05/15/20 0719 05/15/20 0808  BP: (!) 156/109 (!) 140/92  Pulse: (!) 153 89  Resp: (!) 22 19  Temp: 36.9 C 36.6 C  SpO2: 96% 99%    Last Pain:  Vitals:   05/15/20 0808  TempSrc: Temporal  PainSc: 0-No pain                 Lannie Fields

## 2020-05-15 NOTE — Anesthesia Preprocedure Evaluation (Addendum)
Anesthesia Evaluation  Patient identified by MRN, date of birth, ID band Patient awake    Reviewed: Allergy & Precautions, NPO status , Patient's Chart, lab work & pertinent test results, reviewed documented beta blocker date and time   Airway Mallampati: III  TM Distance: >3 FB Neck ROM: Full    Dental  (+) Teeth Intact, Dental Advisory Given   Pulmonary neg pulmonary ROS,    Pulmonary exam normal breath sounds clear to auscultation       Cardiovascular hypertension, Pt. on medications and Pt. on home beta blockers +CHF (LVEF<20%)  + dysrhythmias (xarelto) Atrial Fibrillation + Valvular Problems/Murmurs (mild MR) MR  Rhythm:Irregular Rate:Tachycardia  Echo 05/14/20: 1. There is a significant difference since the prior study, LVEF is now <  20%, RVEF is at least mildly decreased. No apical thrombus is seen on  Definity echocontrast images. LVEF might be underestimated by atrial  fibrillation with rapid ventricular rate  during acquisition.  2. Left ventricular ejection fraction, by estimation, is <20%. The left  ventricle has severely decreased function. The left ventricle demonstrates  global hypokinesis. The left ventricular internal cavity size was  moderately dilated. There is mild  concentric left ventricular hypertrophy. Left ventricular diastolic  function could not be evaluated.  3. Right ventricular systolic function is mildly reduced. The right  ventricular size is mildly enlarged.  4. Left atrial size was moderately dilated.  5. Right atrial size was mildly dilated.  6. A small pericardial effusion is present. The pericardial effusion is  posterior to the left ventricle.  7. The mitral valve is normal in structure. Mild mitral valve  regurgitation. No evidence of mitral stenosis.  8. The aortic valve is normal in structure. Aortic valve regurgitation is  not visualized. No aortic stenosis is present.  9.  The inferior vena cava is dilated in size with >50% respiratory  variability, suggesting right atrial pressure of 8 mmHg.    Neuro/Psych PSYCHIATRIC DISORDERS Anxiety Depression negative neurological ROS     GI/Hepatic negative GI ROS, (+)     substance abuse  alcohol use,   Endo/Other  Morbid obesityBMI 40  Renal/GU Renal InsufficiencyRenal diseaseCr 1.46  negative genitourinary   Musculoskeletal negative musculoskeletal ROS (+)   Abdominal   Peds  Hematology negative hematology ROS (+)   Anesthesia Other Findings HF 2/2 EtOH abuse  Reproductive/Obstetrics negative OB ROS                            Anesthesia Physical Anesthesia Plan  ASA: IV  Anesthesia Plan: General   Post-op Pain Management:    Induction: Intravenous  PONV Risk Score and Plan: TIVA and Treatment may vary due to age or medical condition  Airway Management Planned: Natural Airway and Mask  Additional Equipment: None  Intra-op Plan:   Post-operative Plan:   Informed Consent: I have reviewed the patients History and Physical, chart, labs and discussed the procedure including the risks, benefits and alternatives for the proposed anesthesia with the patient or authorized representative who has indicated his/her understanding and acceptance.       Plan Discussed with: CRNA  Anesthesia Plan Comments:         Anesthesia Quick Evaluation

## 2020-05-16 ENCOUNTER — Encounter (HOSPITAL_COMMUNITY): Payer: Self-pay | Admitting: Internal Medicine

## 2020-05-16 ENCOUNTER — Inpatient Hospital Stay: Payer: Self-pay

## 2020-05-16 LAB — COMPREHENSIVE METABOLIC PANEL
ALT: 68 U/L — ABNORMAL HIGH (ref 0–44)
AST: 30 U/L (ref 15–41)
Albumin: 3.6 g/dL (ref 3.5–5.0)
Alkaline Phosphatase: 42 U/L (ref 38–126)
Anion gap: 14 (ref 5–15)
BUN: 14 mg/dL (ref 6–20)
CO2: 26 mmol/L (ref 22–32)
Calcium: 8.3 mg/dL — ABNORMAL LOW (ref 8.9–10.3)
Chloride: 95 mmol/L — ABNORMAL LOW (ref 98–111)
Creatinine, Ser: 1.35 mg/dL — ABNORMAL HIGH (ref 0.61–1.24)
GFR, Estimated: >60 mL/min (ref >60)
Glucose, Bld: 303 mg/dL — ABNORMAL HIGH (ref 70–99)
Potassium: 3.4 mmol/L — ABNORMAL LOW (ref 3.5–5.1)
Sodium: 135 mmol/L (ref 135–145)
Total Bilirubin: 0.7 mg/dL (ref 0.3–1.2)
Total Protein: 6 g/dL — ABNORMAL LOW (ref 6.5–8.1)

## 2020-05-16 LAB — CBC
HCT: 45.6 % (ref 39.0–52.0)
Hemoglobin: 14.9 g/dL (ref 13.0–17.0)
MCH: 29.3 pg (ref 26.0–34.0)
MCHC: 32.7 g/dL (ref 30.0–36.0)
MCV: 89.8 fL (ref 80.0–100.0)
Platelets: 275 10*3/uL (ref 150–400)
RBC: 5.08 MIL/uL (ref 4.22–5.81)
RDW: 13.8 % (ref 11.5–15.5)
WBC: 10.2 10*3/uL (ref 4.0–10.5)
nRBC: 0 % (ref 0.0–0.2)

## 2020-05-16 LAB — COOXEMETRY PANEL
Carboxyhemoglobin: 1.2 % (ref 0.5–1.5)
Carboxyhemoglobin: 0.9 % (ref 0.5–1.5)
Methemoglobin: 0.6 % (ref 0.0–1.5)
Methemoglobin: 0.7 % (ref 0.0–1.5)
O2 Saturation: 72 %
O2 Saturation: 47 %
Total hemoglobin: 15.3 g/dL (ref 12.0–16.0)
Total hemoglobin: 17.4 g/dL — ABNORMAL HIGH (ref 12.0–16.0)

## 2020-05-16 MED ORDER — CLONAZEPAM 0.5 MG PO TABS
0.5000 mg | ORAL_TABLET | Freq: Every evening | ORAL | Status: DC | PRN
Start: 2020-05-16 — End: 2020-05-18

## 2020-05-16 MED ORDER — DAPAGLIFLOZIN PROPANEDIOL 10 MG PO TABS
10.0000 mg | ORAL_TABLET | Freq: Every day | ORAL | Status: DC
  Administered 2020-05-16 – 2020-05-18 (×3): 10 mg via ORAL
  Filled 2020-05-16 (×3): qty 1

## 2020-05-16 MED ORDER — CALCIUM CARBONATE ANTACID 500 MG PO CHEW: 1.0000 | CHEWABLE_TABLET | Freq: Three times a day (TID) | ORAL | Status: DC | PRN

## 2020-05-16 MED ORDER — SACUBITRIL-VALSARTAN 49-51 MG PO TABS
1.0000 | ORAL_TABLET | Freq: Two times a day (BID) | ORAL | Status: DC
  Administered 2020-05-16 – 2020-05-17 (×4): 1 via ORAL
  Filled 2020-05-16 (×6): qty 1

## 2020-05-16 NOTE — Progress Notes (Signed)
Patient ID: Alexander [name], male   DOB: [birth year], 45 y.o.   MRN: [id number]    Advanced Heart Failure Rounding Note   Subjective:    DCCV 1/7 back to NSR.   Feels good this morning, remains in NSR.  Co-ox 72%.  He is on milrinone 0.125, amiodarone gtt 60 mg/hr.  I/Os negative with Lasix 80 mg IV bid. Creatinine trending down, 1.35.   Objective:   Weight Range:  Vital Signs:   Temp:  [98 F (36.7 C)-98.4 F (36.9 C)] 98.3 F (36.8 C) (01/08 0405) Pulse Rate:  [87] 87 (01/07 0832) Resp:  [13-31] 13 (01/08 0759) BP: (111-147)/(73-105) 147/103 (01/08 0700) SpO2:  [92 %-99 %] 92 % (01/08 0759) Weight:  [137.4 kg] 137.4 kg (01/08 0405) Last BM Date: 05/14/20  Weight change: Filed Weights   05/14/20 0410 05/15/20 0500 05/16/20 0405  Weight: (!) 137 kg (!) 136.9 kg (!) 137.4 kg    Intake/Output:   Intake/Output Summary (Last 24 hours) at 05/16/2020 0827 Last data filed at 05/16/2020 0700 Gross per 24 hour  Intake 1337.34 ml  Output 2800 ml  Net -1462.66 ml     Physical Exam: General: NAD Neck: JVP 10 cm, no thyromegaly or thyroid nodule.  Lungs: Clear to auscultation bilaterally with normal respiratory effort. CV: Nondisplaced PMI.  Heart regular S1/S2, no S3/S4, no murmur.  Trace ankle edema.  Abdomen: Soft, nontender, no hepatosplenomegaly, no distention.  Skin: Intact without lesions or rashes.  Neurologic: Alert and oriented x 3.  Psych: Normal affect. Extremities: No clubbing or cyanosis.  HEENT: Normal.   Telemetry:  NSR 70-80s Personally reviewed   Labs: Basic Metabolic Panel: Recent Labs  Lab 05/13/20 1535 05/14/20 0022 05/14/20 1702 05/15/20 0420 05/16/20 0500  NA 137 132* 139 136 135  K 4.0 3.4* 3.3* 3.3* 3.4*  CL 103 96* 100 95* 95*  CO2 22 21* 27 27 26   GLUCOSE 116* 110* 110* 279* 303*  BUN 20 18 17 15 14   CREATININE 1.64* 1.53* 1.46* 1.46* 1.35*  CALCIUM 9.4 8.7* 9.0 7.8* 8.3*  MG  --  1.9  --  2.1  --     Liver Function Tests: Recent  Labs  Lab 05/13/20 1535 05/15/20 0420 05/16/20 0500  AST 76* 41 30  ALT 114* 86* 68*  ALKPHOS 55 44 42  BILITOT 1.4* 1.0 0.7  PROT 6.6 5.8* 6.0*  ALBUMIN 4.2 3.6 3.6   Recent Labs  Lab 05/13/20 1535  LIPASE 48   No results for input(s): AMMONIA in the last 168 hours.  CBC: Recent Labs  Lab 05/13/20 1535 05/13/20 2006 05/14/20 0022 05/15/20 0420 05/16/20 0500  WBC 12.7* 12.1* 10.9* 9.2 10.2  NEUTROABS 9.9*  --   --   --   --   HGB 15.9 15.3 15.8 13.8 14.9  HCT 46.4 45.7 47.8 42.6 45.6  MCV 89.2 88.1 88.0 89.9 89.8  PLT 345 289 315 261 275    Cardiac Enzymes: No results for input(s): CKTOTAL, CKMB, CKMBINDEX, TROPONINI in the last 168 hours.  BNP: BNP (last 3 results) No results for input(s): BNP in the last 8760 hours.  ProBNP (last 3 results) No results for input(s): PROBNP in the last 8760 hours.    Other results:  Imaging: No results found.   Medications:     Scheduled Medications: . allopurinol  100 mg Oral Daily  . buPROPion  300 mg Oral Daily  . Chlorhexidine Gluconate Cloth  6 each Topical Daily  . digoxin  0.125 mg Oral Daily  . furosemide  80 mg Intravenous BID  . polyethylene glycol  17 g Oral Daily  . potassium chloride  40 mEq Oral BID  . rivaroxaban  20 mg Oral Q supper  . sacubitril-valsartan  1 tablet Oral BID  . senna-docusate  2 tablet Oral QHS  . sodium chloride flush  10-40 mL Intracatheter Q12H  . sodium chloride flush  3 mL Intravenous Q12H  . spironolactone  25 mg Oral Daily    Infusions: . sodium chloride    . sodium chloride    . amiodarone 60 mg/hr (05/16/20 0700)    PRN Medications: sodium chloride, acetaminophen, alum & mag hydroxide-simeth, calcium carbonate, ondansetron (ZOFRAN) IV, sodium chloride flush, sodium chloride flush   Assessment/Plan:    1. A/C Systolic Heart Failure -> cardiogenic shock. - Recently diagnosed reduced EF back in December with EF 35-40%. EF this admit 20% RV mildly down -  Troponin low despite extreme hemodynamic stress suspect tachy-mediated CM. Can defer ischemic eval for now. Cath eventually - On milrinone 0.125. Co-ox improved to 72%, will stop milrinone today.   - Still mildly volume overloaded with CVP 12. Continue IV lasix 80 mg bid today, replace K.  - Continue digoxin 0.125. - Continue spironolactone 25 daily.  - Increase Entresto to 49/51 bid.  - Add Farxiga 10 mg daily.   2. A Fib RVR  - s/p DC-CV 1/7, remains in NSR.  - Decrease amiodarone gtt to 30 mg/hr, probably to po tomorrow.  - Continue Xarelto.  3. AKI - due to ATN/shock - improving with hemodynamic support  4. Shock liver - improving with hemodynamic support  5. Hypokalemia - BMET pending   6. Obesity/ Probable OSA - Body mass index is 40.72 kg/m. - will need outpatient sleep study  7. ETOH  - Discussed need for cessation.    Length of Stay: 3   Marca Ancona MD 05/16/2020, 8:27 AM  Advanced Heart Failure Team Pager [phone] (M-F; 7a - 4p)  Please contact [facility] for night-coverage after hours (4p -7a ) and weekends on [email or web address]

## 2020-05-17 LAB — HEMOGLOBIN A1C
Hgb A1c MFr Bld: 6 % — ABNORMAL HIGH (ref 4.8–5.6)
Mean Plasma Glucose: 125.5 mg/dL

## 2020-05-17 LAB — COMPREHENSIVE METABOLIC PANEL
ALT: 66 U/L — ABNORMAL HIGH (ref 0–44)
AST: 36 U/L (ref 15–41)
Albumin: 3.8 g/dL (ref 3.5–5.0)
Alkaline Phosphatase: 47 U/L (ref 38–126)
Anion gap: 11 (ref 5–15)
BUN: 11 mg/dL (ref 6–20)
CO2: 27 mmol/L (ref 22–32)
Calcium: 8.9 mg/dL (ref 8.9–10.3)
Chloride: 98 mmol/L (ref 98–111)
Creatinine, Ser: 1.29 mg/dL — ABNORMAL HIGH (ref 0.61–1.24)
GFR, Estimated: >60 mL/min (ref >60)
Glucose, Bld: 200 mg/dL — ABNORMAL HIGH (ref 70–99)
Potassium: 3.7 mmol/L (ref 3.5–5.1)
Sodium: 136 mmol/L (ref 135–145)
Total Bilirubin: 1.2 mg/dL (ref 0.3–1.2)
Total Protein: 6.5 g/dL (ref 6.5–8.1)

## 2020-05-17 LAB — COOXEMETRY PANEL
Carboxyhemoglobin: 1 % (ref 0.5–1.5)
Carboxyhemoglobin: 1.1 % (ref 0.5–1.5)
Methemoglobin: 0.6 % (ref 0.0–1.5)
Methemoglobin: 0.7 % (ref 0.0–1.5)
O2 Saturation: 55.2 %
O2 Saturation: 58.6 %
Total hemoglobin: 17.3 g/dL — ABNORMAL HIGH (ref 12.0–16.0)
Total hemoglobin: 17.3 g/dL — ABNORMAL HIGH (ref 12.0–16.0)

## 2020-05-17 LAB — CBC
HCT: 50.4 % (ref 39.0–52.0)
Hemoglobin: 17.2 g/dL — ABNORMAL HIGH (ref 13.0–17.0)
MCH: 29.9 pg (ref 26.0–34.0)
MCHC: 34.1 g/dL (ref 30.0–36.0)
MCV: 87.7 fL (ref 80.0–100.0)
Platelets: 288 10*3/uL (ref 150–400)
RBC: 5.75 MIL/uL (ref 4.22–5.81)
RDW: 14.1 % (ref 11.5–15.5)
WBC: 7.8 10*3/uL (ref 4.0–10.5)
nRBC: 0 % (ref 0.0–0.2)

## 2020-05-17 MED ORDER — CARVEDILOL 3.125 MG PO TABS: 3.1250 mg | ORAL_TABLET | Freq: Two times a day (BID) | ORAL | Status: DC

## 2020-05-17 MED ORDER — CARVEDILOL 3.125 MG PO TABS
3.1250 mg | ORAL_TABLET | Freq: Two times a day (BID) | ORAL | Status: DC
  Administered 2020-05-17 – 2020-05-18 (×3): 3.125 mg via ORAL
  Filled 2020-05-17 (×3): qty 1

## 2020-05-17 MED ORDER — POTASSIUM CHLORIDE CRYS ER 20 MEQ PO TBCR
40.0000 meq | EXTENDED_RELEASE_TABLET | Freq: Once | ORAL | Status: AC
  Administered 2020-05-17: 40 meq via ORAL
  Filled 2020-05-17: qty 2

## 2020-05-17 NOTE — Progress Notes (Signed)
Pt transferred to 6E in wheelchair. Report called prior to [facility]. No issue during transport.

## 2020-05-17 NOTE — Plan of Care (Signed)
  Problem: Education: Goal: Knowledge of General Education information will improve Description: Including pain rating scale, medication(s)/side effects and non-pharmacologic comfort measures Outcome: Progressing   Problem: Health Behavior/Discharge Planning: Goal: Ability to manage health-related needs will improve Outcome: Progressing   Problem: Clinical Measurements: Goal: Ability to maintain clinical measurements within normal limits will improve Outcome: Progressing Goal: Will remain free from infection Outcome: Progressing Goal: Diagnostic test results will improve Outcome: Progressing Goal: Respiratory complications will improve Outcome: Progressing Goal: Cardiovascular complication will be avoided Outcome: Progressing   Problem: Activity: Goal: Risk for activity intolerance will decrease Outcome: Progressing   Problem: Nutrition: Goal: Adequate nutrition will be maintained Outcome: Progressing   Problem: Coping: Goal: Level of anxiety will decrease Outcome: Progressing   Problem: Elimination: Goal: Will not experience complications related to bowel motility Outcome: Progressing Goal: Will not experience complications related to urinary retention Outcome: Progressing   Problem: Pain Managment: Goal: General experience of comfort will improve Outcome: Progressing   Problem: Safety: Goal: Ability to remain free from injury will improve Outcome: Progressing   Problem: Skin Integrity: Goal: Risk for impaired skin integrity will decrease Outcome: Progressing   Problem: Education: Goal: Ability to demonstrate management of disease process will improve Outcome: Progressing Goal: Ability to verbalize understanding of medication therapies will improve Outcome: Progressing Goal: Individualized Educational Video(s) Outcome: Progressing   Problem: Activity: Goal: Capacity to carry out activities will improve Outcome: Progressing   Problem: Cardiac: Goal:  Ability to achieve and maintain adequate cardiopulmonary perfusion will improve Outcome: Progressing   Problem: Education: Goal: Knowledge of disease or condition will improve Outcome: Progressing Goal: Understanding of medication regimen will improve Outcome: Progressing Goal: Individualized Educational Video(s) Outcome: Progressing   Problem: Activity: Goal: Ability to tolerate increased activity will improve Outcome: Progressing   Problem: Cardiac: Goal: Ability to achieve and maintain adequate cardiopulmonary perfusion will improve Outcome: Progressing   Problem: Health Behavior/Discharge Planning: Goal: Ability to safely manage health-related needs after discharge will improve Outcome: Progressing   

## 2020-05-17 NOTE — Progress Notes (Signed)
Patient ID: Alexander [name], male   DOB: 09-Mar-1976, 45 y.o.   MRN: [id number]    Advanced Heart Failure Rounding Note   Subjective:    DCCV 1/7 back to NSR.   Feels good this morning, remains in NSR but had a run of atrial fibrillation with RVR last night.  Co-ox 55%.  He is off milrinone, on amiodarone gtt 30 mg/hr.  I/Os negative with Lasix 80 mg IV bid. Creatinine down to 1.29.  CVP 4.    Objective:   Weight Range:  Vital Signs:   Temp:  [97.8 F (36.6 C)-98 F (36.7 C)] 98 F (36.7 C) (01/09 0807) Pulse Rate:  [92-122] 99 (01/09 0400) Resp:  [17-43] 17 (01/09 0800) BP: (106-132)/(77-115) 126/93 (01/09 0800) SpO2:  [92 %-98 %] 95 % (01/09 0800) Weight:  [136.7 kg] 136.7 kg (01/09 0500) Last BM Date: 05/17/20  Weight change: Filed Weights   05/15/20 0500 05/16/20 0405 05/17/20 0500  Weight: (!) 136.9 kg (!) 137.4 kg (!) 136.7 kg    Intake/Output:   Intake/Output Summary (Last 24 hours) at 05/17/2020 0936 Last data filed at 05/17/2020 0800 Gross per 24 hour  Intake 403.08 ml  Output 1400 ml  Net -996.92 ml     Physical Exam: General: NAD Neck: Thick. No JVD, no thyromegaly or thyroid nodule.  Lungs: Clear to auscultation bilaterally with normal respiratory effort. CV: Nondisplaced PMI.  Heart regular S1/S2, no S3/S4, no murmur.  No peripheral edema.   Abdomen: Soft, nontender, no hepatosplenomegaly, no distention.  Skin: Intact without lesions or rashes.  Neurologic: Alert and oriented x 3.  Psych: Normal affect. Extremities: No clubbing or cyanosis.  HEENT: Normal.   Telemetry:  NSR 70s-80s with PACs Personally reviewed   Labs: Basic Metabolic Panel: Recent Labs  Lab 05/14/20 0022 05/14/20 1702 05/15/20 0420 05/16/20 0500 05/17/20 0522  NA 132* 139 136 135 136  K 3.4* 3.3* 3.3* 3.4* 3.7  CL 96* 100 95* 95* 98  CO2 21* 27 27 26 27   GLUCOSE 110* 110* 279* 303* 200*  BUN 18 17 15 14 11   CREATININE 1.53* 1.46* 1.46* 1.35* 1.29*  CALCIUM 8.7* 9.0 7.8*  8.3* 8.9  MG 1.9  --  2.1  --   --     Liver Function Tests: Recent Labs  Lab 05/13/20 1535 05/15/20 0420 05/16/20 0500 05/17/20 0522  AST 76* 41 30 36  ALT 114* 86* 68* 66*  ALKPHOS 55 44 42 47  BILITOT 1.4* 1.0 0.7 1.2  PROT 6.6 5.8* 6.0* 6.5  ALBUMIN 4.2 3.6 3.6 3.8   Recent Labs  Lab 05/13/20 1535  LIPASE 48   No results for input(s): AMMONIA in the last 168 hours.  CBC: Recent Labs  Lab 05/13/20 1535 05/13/20 2006 05/14/20 0022 05/15/20 0420 05/16/20 0500 05/17/20 0522  WBC 12.7* 12.1* 10.9* 9.2 10.2 7.8  NEUTROABS 9.9*  --   --   --   --   --   HGB 15.9 15.3 15.8 13.8 14.9 17.2*  HCT 46.4 45.7 47.8 42.6 45.6 50.4  MCV 89.2 88.1 88.0 89.9 89.8 87.7  PLT 345 289 315 261 275 288    Cardiac Enzymes: No results for input(s): CKTOTAL, CKMB, CKMBINDEX, TROPONINI in the last 168 hours.  BNP: BNP (last 3 results) No results for input(s): BNP in the last 8760 hours.  ProBNP (last 3 results) No results for input(s): PROBNP in the last 8760 hours.    Other results:  Imaging: 07/14/20 EKG SITE RITE  Result Date: 05/16/2020 If Site Rite image not attached, placement could not be confirmed due to current cardiac rhythm.    Medications:     Scheduled Medications: . allopurinol  100 mg Oral Daily  . buPROPion  300 mg Oral Daily  . carvedilol  3.125 mg Oral BID WC  . Chlorhexidine Gluconate Cloth  6 each Topical Daily  . dapagliflozin propanediol  10 mg Oral Daily  . digoxin  0.125 mg Oral Daily  . polyethylene glycol  17 g Oral Daily  . potassium chloride  40 mEq Oral BID  . rivaroxaban  20 mg Oral Q supper  . sacubitril-valsartan  1 tablet Oral BID  . senna-docusate  2 tablet Oral QHS  . sodium chloride flush  10-40 mL Intracatheter Q12H  . sodium chloride flush  3 mL Intravenous Q12H  . spironolactone  25 mg Oral Daily    Infusions: . sodium chloride    . sodium chloride    . amiodarone 30 mg/hr (05/17/20 0800)    PRN Medications: sodium  chloride, acetaminophen, alum & mag hydroxide-simeth, calcium carbonate, clonazePAM, ondansetron (ZOFRAN) IV, sodium chloride flush, sodium chloride flush   Assessment/Plan:    1. A/C Systolic Heart Failure -> cardiogenic shock. - Recently diagnosed reduced EF back in December with EF 35-40%. EF this admit 20% RV mildly down - Troponin low despite extreme hemodynamic stress suspect tachy-mediated CM. Can defer ischemic eval for now. Cath eventually - Off milrinone, co-ox 55% with CVP 4.  - Hold diuretics today. - Continue digoxin 0.125. - Continue spironolactone 25 daily.  - Continue Entresto 49/51 bid.  - Continue Farxiga 10 mg daily.  - Add Coreg 3.125 mg bid  2. A Fib RVR  - s/p DC-CV 1/7, remains in NSR but had a run of AF/RVR last night.  - With AF run last night, continue IV amiodarone today and convert to 400 mg bid tomorrow if he stays out of AF.   - Continue Xarelto.  3. AKI - due to ATN/shock - Resolved.   4. Shock liver - improving with hemodynamic support  5. Hypokalemia - Can cut back on KCl now that we've stopped IV Lasix.    6. Obesity/ Probable OSA - Body mass index is 40.72 kg/m. - will need outpatient sleep study  7. ETOH  - Discussed need for cessation.   Can go to step down.    Length of Stay: 4   Marca Ancona MD 05/17/2020, 9:36 AM  Advanced Heart Failure Team Pager [phone] (M-F; 7a - 4p)  Please contact [facility] for night-coverage after hours (4p -7a ) and weekends on [email or web address]

## 2020-05-18 ENCOUNTER — Encounter (HOSPITAL_COMMUNITY): Payer: Self-pay

## 2020-05-18 ENCOUNTER — Other Ambulatory Visit (HOSPITAL_COMMUNITY): Payer: Self-pay | Admitting: Adult Health

## 2020-05-18 ENCOUNTER — Telehealth (HOSPITAL_COMMUNITY): Payer: Self-pay | Admitting: Pharmacist

## 2020-05-18 DIAGNOSIS — I480 Paroxysmal atrial fibrillation: Secondary | ICD-10-CM

## 2020-05-18 LAB — COMPREHENSIVE METABOLIC PANEL
ALT: 66 U/L — ABNORMAL HIGH (ref 0–44)
AST: 43 U/L — ABNORMAL HIGH (ref 15–41)
Albumin: 3.7 g/dL (ref 3.5–5.0)
Alkaline Phosphatase: 46 U/L (ref 38–126)
Anion gap: 10 (ref 5–15)
BUN: 12 mg/dL (ref 6–20)
CO2: 26 mmol/L (ref 22–32)
Calcium: 9.4 mg/dL (ref 8.9–10.3)
Chloride: 102 mmol/L (ref 98–111)
Creatinine, Ser: 1.32 mg/dL — ABNORMAL HIGH (ref 0.61–1.24)
GFR, Estimated: >60 mL/min (ref >60)
Glucose, Bld: 97 mg/dL (ref 70–99)
Potassium: 3.9 mmol/L (ref 3.5–5.1)
Sodium: 138 mmol/L (ref 135–145)
Total Bilirubin: 1.1 mg/dL (ref 0.3–1.2)
Total Protein: 6.2 g/dL — ABNORMAL LOW (ref 6.5–8.1)

## 2020-05-18 LAB — COOXEMETRY PANEL
Carboxyhemoglobin: 1.1 % (ref 0.5–1.5)
Methemoglobin: 0.5 % (ref 0.0–1.5)
O2 Saturation: 59.6 %
Total hemoglobin: 17.1 g/dL — ABNORMAL HIGH (ref 12.0–16.0)

## 2020-05-18 MED ORDER — DIGOXIN 125 MCG PO TABS: 0.1250 mg | ORAL_TABLET | Freq: Every day | ORAL | 6 refills | Status: DC

## 2020-05-18 MED ORDER — AMIODARONE HCL 200 MG PO TABS
400.0000 mg | ORAL_TABLET | Freq: Two times a day (BID) | ORAL | Status: DC
  Administered 2020-05-18: 400 mg via ORAL
  Filled 2020-05-18: qty 2

## 2020-05-18 MED ORDER — SACUBITRIL-VALSARTAN 97-103 MG PO TABS: 1.0000 | ORAL_TABLET | Freq: Two times a day (BID) | ORAL | 6 refills | Status: DC

## 2020-05-18 MED ORDER — DAPAGLIFLOZIN PROPANEDIOL 10 MG PO TABS: 10.0000 mg | ORAL_TABLET | Freq: Every day | ORAL | 6 refills | Status: DC

## 2020-05-18 MED ORDER — SACUBITRIL-VALSARTAN 97-103 MG PO TABS
1.0000 | ORAL_TABLET | Freq: Two times a day (BID) | ORAL | Status: DC
  Administered 2020-05-18: 1 via ORAL
  Filled 2020-05-18: qty 1

## 2020-05-18 MED ORDER — CARVEDILOL 3.125 MG PO TABS: 3.1250 mg | ORAL_TABLET | Freq: Two times a day (BID) | ORAL | 6 refills | Status: DC

## 2020-05-18 MED ORDER — AMIODARONE HCL 200 MG PO TABS: ORAL_TABLET | ORAL | 6 refills | Status: DC

## 2020-05-18 MED ORDER — SPIRONOLACTONE 25 MG PO TABS: 25.0000 mg | ORAL_TABLET | Freq: Every day | ORAL | 6 refills | Status: DC

## 2020-05-18 MED FILL — DIGOXIN 0.125 MG TABLET: 125 | 30 days supply | Qty: 30 | Fill #0

## 2020-05-18 MED FILL — ENTRESTO 97 MG-103 MG TAB: 97-103 | 30 days supply | Qty: 60 | Fill #0

## 2020-05-18 MED FILL — AMIODARONE HCL 200 MG TAB: 200 | 30 days supply | Qty: 90 | Fill #0

## 2020-05-18 MED FILL — FARXIGA 10 MG TABLET: 10 | 30 days supply | Qty: 30 | Fill #0

## 2020-05-18 MED FILL — CARVEDILOL 3.125 MG TABLET: 3.125 | 30 days supply | Qty: 60 | Fill #0

## 2020-05-18 MED FILL — SPIRONOLACTONE 25 MG TABLET: 25 | 30 days supply | Qty: 30 | Fill #0

## 2020-05-18 NOTE — Plan of Care (Signed)
  Problem: Health Behavior/Discharge Planning: Goal: Ability to manage health-related needs will improve Outcome: Progressing   Problem: Clinical Measurements: Goal: Ability to maintain clinical measurements within normal limits will improve Outcome: Progressing Goal: Diagnostic test results will improve Outcome: Progressing Goal: Cardiovascular complication will be avoided Outcome: Progressing   Problem: Activity: Goal: Risk for activity intolerance will decrease Outcome: Progressing   Problem: Coping: Goal: Level of anxiety will decrease Outcome: Progressing   Problem: Nutrition: Goal: Adequate nutrition will be maintained Outcome: Completed/Met   Problem: Elimination: Goal: Will not experience complications related to bowel motility Outcome: Completed/Met Goal: Will not experience complications related to urinary retention Outcome: Completed/Met

## 2020-05-18 NOTE — Telephone Encounter (Signed)
Advanced Heart Failure Patient Advocate Encounter  Prior Authorization for [facility] has been approved.    PA# [id number] Effective dates: 05/18/2020 through 05/18/2021  Karle Plumber, PharmD, BCPS, BCCP, CPP Heart Failure Clinic Pharmacist [phone]

## 2020-05-18 NOTE — Progress Notes (Addendum)
Patient ID: Alexander [name], male   DOB: 12-01-1975, 45 y.o.   MRN: [id number]    Advanced Heart Failure Rounding Note   Subjective:    DCCV 1/7 back to NSR.   Feeling good today.. Denies SOB. Walks down the hall. Co-ox 60%   Objective:   Weight Range:  Vital Signs:   Temp:  [97.7 F (36.5 C)-98.9 F (37.2 C)] 97.7 F (36.5 C) (01/10 0420) Pulse Rate:  [40-80] 73 (01/10 0420) Resp:  [14-32] 16 (01/10 0420) BP: (114-151)/(83-101) 120/92 (01/10 0420) SpO2:  [93 %-100 %] 100 % (01/10 0420) Weight:  [135.9 kg] 135.9 kg (01/10 0700) Last BM Date: 05/17/20  Weight change: Filed Weights   05/16/20 0405 05/17/20 0500 05/18/20 0700  Weight: (!) 137.4 kg (!) 136.7 kg 135.9 kg    Intake/Output:   Intake/Output Summary (Last 24 hours) at 05/18/2020 0723 Last data filed at 05/17/2020 1100 Gross per 24 hour  Intake 66.76 ml  Output -  Net 66.76 ml    CVP 1-2.  Physical Exam: General:  Well appearing. No resp difficulty HEENT: normal Neck: supple. no JVD. Carotids 2+ bilat; no bruits. No lymphadenopathy or thryomegaly appreciated. Cor: PMI nondisplaced. Regular rate & rhythm. No rubs, gallops or murmurs. Lungs: clear Abdomen: soft, nontender, nondistended. No hepatosplenomegaly. No bruits or masses. Good bowel sounds. Extremities: no cyanosis, clubbing, rash, edema. RUE PICC Neuro: alert & orientedx3, cranial nerves grossly intact. moves all 4 extremities w/o difficulty. Affect pleasant  Telemetry: NSR with occasional PVCs 90s   Labs: Basic Metabolic Panel: Recent Labs  Lab 05/14/20 0022 05/14/20 1702 05/15/20 0420 05/16/20 0500 05/17/20 0522 05/18/20 0428  NA 132* 139 136 135 136 138  K 3.4* 3.3* 3.3* 3.4* 3.7 3.9  CL 96* 100 95* 95* 98 102  CO2 21* 27 27 26 27 26   GLUCOSE 110* 110* 279* 303* 200* 97  BUN 18 17 15 14 11 12   CREATININE 1.53* 1.46* 1.46* 1.35* 1.29* 1.32*  CALCIUM 8.7* 9.0 7.8* 8.3* 8.9 9.4  MG 1.9  --  2.1  --   --   --     Liver Function  Tests: Recent Labs  Lab 05/13/20 1535 05/15/20 0420 05/16/20 0500 05/17/20 0522 05/18/20 0428  AST 76* 41 30 36 43*  ALT 114* 86* 68* 66* 66*  ALKPHOS 55 44 42 47 46  BILITOT 1.4* 1.0 0.7 1.2 1.1  PROT 6.6 5.8* 6.0* 6.5 6.2*  ALBUMIN 4.2 3.6 3.6 3.8 3.7   Recent Labs  Lab 05/13/20 1535  LIPASE 48   No results for input(s): AMMONIA in the last 168 hours.  CBC: Recent Labs  Lab 05/13/20 1535 05/13/20 2006 05/14/20 0022 05/15/20 0420 05/16/20 0500 05/17/20 0522  WBC 12.7* 12.1* 10.9* 9.2 10.2 7.8  NEUTROABS 9.9*  --   --   --   --   --   HGB 15.9 15.3 15.8 13.8 14.9 17.2*  HCT 46.4 45.7 47.8 42.6 45.6 50.4  MCV 89.2 88.1 88.0 89.9 89.8 87.7  PLT 345 289 315 261 275 288    Cardiac Enzymes: No results for input(s): CKTOTAL, CKMB, CKMBINDEX, TROPONINI in the last 168 hours.  BNP: BNP (last 3 results) No results for input(s): BNP in the last 8760 hours.  ProBNP (last 3 results) No results for input(s): PROBNP in the last 8760 hours.    Other results:  Imaging: 07/14/20 EKG SITE RITE  Result Date: 05/16/2020 If Site Rite image not attached, placement could not be confirmed due  to current cardiac rhythm.    Medications:     Scheduled Medications: . allopurinol  100 mg Oral Daily  . buPROPion  300 mg Oral Daily  . carvedilol  3.125 mg Oral BID WC  . Chlorhexidine Gluconate Cloth  6 each Topical Daily  . dapagliflozin propanediol  10 mg Oral Daily  . digoxin  0.125 mg Oral Daily  . polyethylene glycol  17 g Oral Daily  . rivaroxaban  20 mg Oral Q supper  . sacubitril-valsartan  1 tablet Oral BID  . senna-docusate  2 tablet Oral QHS  . sodium chloride flush  10-40 mL Intracatheter Q12H  . sodium chloride flush  3 mL Intravenous Q12H  . spironolactone  25 mg Oral Daily    Infusions: . sodium chloride    . sodium chloride    . amiodarone 30 mg/hr (05/18/20 0221)    PRN Medications: sodium chloride, acetaminophen, alum & mag hydroxide-simeth, calcium  carbonate, clonazePAM, ondansetron (ZOFRAN) IV, sodium chloride flush, sodium chloride flush   Assessment/Plan:    1. A/C Systolic Heart Failure -> cardiogenic shock. - Recently diagnosed reduced EF back in December with EF 35-40%. EF this admit 20% RV mildly down - Troponin low despite extreme hemodynamic stress suspect tachy-mediated CM. Can defer ischemic eval for now. Cath eventually - Off milrinone, co-ox 60%% with CVP 4.  - Volume status stable.  Does not need diuretics.  - Continue digoxin 0.125. - Continue spironolactone 25 daily.  - Increase entresto 97-103 twice a day.   - Continue Farxiga 10 mg daily.  - Continue Coreg 3.125 mg bid - Renal funciton stable.   2. A Fib RVR  - s/p DC-CV 1/7, remains in NSR but had a run of AF/RVR last night.  - Maintaining NSR. Stop amio drip and start amio 400 mg twice a day x2 weeks then amio 200 mg twice a day.  - Continue Xarelto.  3. AKI - due to ATN/shock -Stable 1.3   4. Shock liver - improving with hemodynamic support  5. Hypokalemia Potassium stable.    6. Obesity/ Probable OSA Body mass index is 39.53 kg/m. - will need outpatient sleep study. We will set up at his follow up.   7. ETOH  - Discussed need for cessation.   We will set up follow up in HF clinic next week. Check EKG/set up sleep study.    Length of Stay: 5   Amy Clegg NP-C  05/18/2020, 7:23 AM  Advanced Heart Failure Team Pager [phone] (M-F; 7a - 4p)  Please contact [facility] for night-coverage after hours (4p -7a ) and weekends on [email or web address]  Patient seen and examined with the above-signed Advanced Practice Provider and/or Housestaff. I personally reviewed laboratory data, imaging studies and relevant notes. I independently examined the patient and formulated the important aspects of the plan. I have edited the note to reflect any of my changes or salient points. I have personally discussed the plan with the patient and/or family.  Looks  and feels much better. Maintaining NSR on po amio. Co-ox good off milrinone.   General:  Well appearing. No resp difficulty HEENT: normal Neck: supple. no JVD. Carotids 2+ bilat; no bruits. No lymphadenopathy or thryomegaly appreciated. Cor: PMI nondisplaced. Regular rate & rhythm. No rubs, gallops or murmurs. Lungs: clear Abdomen: soft, nontender, nondistended. No hepatosplenomegaly. No bruits or masses. Good bowel sounds. Extremities: no cyanosis, clubbing, rash, edema Neuro: alert & orientedx3, cranial nerves grossly intact. moves all 4 extremities w/o difficulty.  Affect pleasant  Ok for d/c today on above meds. Would make sure he has prn lasix available as needed. F/u in clinic next week. Will need outpatient sleep study.   Arvilla Meres, MD  9:00 AM

## 2020-05-18 NOTE — Progress Notes (Signed)
Pt in NSR this am.   When ready to transition to po amiodarone would do 400 mg BID x 2 weeks then 200 mg BID, hopefully will be able to titrate down and ? Off if EF improves given his young age.   F/u scheduled with Dr. Lalla Brothers in 3 weeks.   Will eventually need ICD consideration if EF does not improve pending ischemic eval and optimized GDMT.   Casimiro Needle [address] [place], PA-C  05/18/2020 7:16 AM

## 2020-05-18 NOTE — Progress Notes (Signed)
Pt has been ambulating independently without problems. Had read HF booklet. Very receptive, discussed HF book, low sodium, exercise, and CRPII. Will refer to [place] CRPII.  0900-1001 Ethelda Chick CES, ACSM 10:11 AM 05/18/2020

## 2020-05-18 NOTE — Progress Notes (Signed)
Pt received discharge instructions and does not have any questions and concerns at this time. Pt had a question abour his hydrochlorothiazide, RN called MD and verified that pt can stop this medication. Pt is ready for discharge lounge.

## 2020-05-18 NOTE — Discharge Summary (Addendum)
Advanced Heart Failure Team  Discharge Summary   Patient ID: Alexander [name] MRN: [id number], DOB/AGE: 09/26/1975 45 y.o. Admit date: 05/13/2020 D/C date:     05/18/2020   Primary Discharge Diagnoses 1. A/C Systolic Heart Fialure--> Cardiogenic Shock  2. A fib RVR 3. AKI  4. Shock Liver 5. Hypokalemia  6. Obesity/Probable OSA 7. ETOH   Hospital Course:  Alexander [name] is a 45 year old with a history of obesity, HTN, hyperlipidemia, adjustment disorder, anxiety, and A fib. No previous heart catherization. No recent viral illnesses.    Says he had been drinking heavily and developed increased shortness of breath and chest tightness. Presented to [facility] on 04/24/20 with increased shortness of breath and chest tightness.  EKG showed A Fib RVR. Started on diltiazem with plans for admit but he left AMA.  Discharged on diltiazem + xarelto.    Presented to [facility] ED on 04/25/20 with HF symptoms. EKG showed A Fib RVR. Admitted and started on diltiazem. ECHO showed EF 35-40%. EP consulted. Started on metoprolol and losartan with plans to reassess in 3 weeks for possible cardioversion.   Sent to [facility] ED from EP with N/V and A fib RVR. On arrival he was cool and volume overloaed.  Started on amio drip + milrinone due to cardiogenic shock. Diuresed with IV lasix and later stopped with addition of GDMT. After amio load had successful cardioversion. Plan to continue po amio with taper. All HF meds provided through TOC.   See below for detailed problem list. At his follow up he will need sleep study set up. Plan to check an ECHO in 3 months after HF meds optimized.   1. A/C Systolic Heart Failure -> cardiogenic shock. - Recently diagnosed reduced EF back in December with EF 35-40%. EF this admit 20% RV mildly down - Troponin low despite extreme hemodynamic stress suspect tachy-mediated CM. Can defer ischemic eval for now. Cath eventually.  - Placed on milrinone and later weaned off - Diuresed with IV  lasix and placed on GDMT.  - Off milrinone, co-ox 60%% with CVP 4.  -. Does not need diuretics.  - Continue digoxin 0.125. - Continue spironolactone 25 daily.  - Increase entresto 97-103 twice a day.   - Continue Farxiga 10 mg daily.  - Continue Coreg 3.125 mg bid - Renal funciton stable. Check BMET at follow up.    2. A Fib RVR  - On admit loaded on amio drip and had DC-CV 1/7.  - Maintaining NSR. Stop amio drip and start amio 400 mg twice a day x2 weeks then amio 200 mg twice a day.  - Continue Xarelto.   3. AKI - due to ATN/shock -Stable 1.3    4. Shock liver - improving with hemodynamic support   5. Hypokalemia Potassium stable.     6. Obesity/ Probable OSA Body mass index is 39.53 kg/m. - will need outpatient sleep study. We will set up at his follow up.    7. ETOH  - Discussed need for cessation  Discharge Vitals: Blood pressure 106/80, pulse 83, temperature 97.7 F (36.5 C), temperature source Oral, resp. rate 16, height 6\' 1"  (1.854 m), weight 135.9 kg, SpO2 100 %.  Labs: Lab Results  Component Value Date   WBC 7.8 05/17/2020   HGB 17.2 (H) 05/17/2020   HCT 50.4 05/17/2020   MCV 87.7 05/17/2020   PLT 288 05/17/2020    Recent Labs  Lab 05/18/20 0428  NA 138  K 3.9  CL 102  CO2 26  BUN 12  CREATININE 1.32*  CALCIUM 9.4  PROT 6.2*  BILITOT 1.1  ALKPHOS 46  ALT 66*  AST 43*  GLUCOSE 97   No results found for: CHOL, HDL, LDLCALC, TRIG BNP (last 3 results) No results for input(s): BNP in the last 8760 hours.  ProBNP (last 3 results) No results for input(s): PROBNP in the last 8760 hours.   Diagnostic Studies/Procedures   [place] EKG SITE RITE  Result Date: 05/16/2020 If Site Rite image not attached, placement could not be confirmed due to current cardiac rhythm.  S/P DC-CV 05/16/19   Discharge Medications   Allergies as of 05/18/2020       Reactions   Albuterol Anaphylaxis   Bee Venom Anaphylaxis   Metaproterenol Anaphylaxis   Other  Anaphylaxis   DEET   Amoxicillin-pot Clavulanate Itching   OK with Amoxicillin   Cephalexin Other (See Comments)   Chest tightness   Amoxicillin Itching, Rash   Itching all over         Medication List     STOP taking these medications    amLODipine 10 MG tablet Commonly known as: NORVASC   hydrochlorothiazide 25 MG tablet Commonly known as: HYDRODIURIL   losartan 100 MG tablet Commonly known as: COZAAR   metoprolol tartrate 50 MG tablet Commonly known as: LOPRESSOR       TAKE these medications    acetaminophen 500 MG tablet Commonly known as: TYLENOL Take 500 mg by mouth every 6 (six) hours as needed for moderate pain.   allopurinol 100 MG tablet Commonly known as: ZYLOPRIM Take 100 mg by mouth daily.   amiodarone 200 MG tablet Commonly known as: PACERONE Take 400 mg twice a day x2 weeks then 200 mg twice a day x2 weeks then 200 mg daily   bismuth subsalicylate 262 MG chewable tablet Commonly known as: PEPTO BISMOL Chew 524 mg by mouth daily as needed for indigestion.   buPROPion 300 MG 24 hr tablet Commonly known as: WELLBUTRIN XL Take 300 mg by mouth daily.   carvedilol 3.125 MG tablet Commonly known as: COREG Take 1 tablet (3.125 mg total) by mouth 2 (two) times daily with a meal.   clonazePAM 0.5 MG tablet Commonly known as: KLONOPIN Take 0.5 mg by mouth 2 (two) times daily as needed for anxiety.   colchicine 0.6 MG tablet Take 0.6 mg by mouth 2 (two) times daily as needed (gout).   dapagliflozin propanediol 10 MG Tabs tablet Commonly known as: FARXIGA Take 1 tablet (10 mg total) by mouth daily.   digoxin 0.125 MG tablet Commonly known as: LANOXIN Take 1 tablet (0.125 mg total) by mouth daily.   fenofibrate 145 MG tablet Commonly known as: TRICOR Take 145 mg by mouth daily.   sacubitril-valsartan 97-103 MG Commonly known as: ENTRESTO Take 1 tablet by mouth 2 (two) times daily.   spironolactone 25 MG tablet Commonly known as:  ALDACTONE Take 1 tablet (25 mg total) by mouth daily.   topiramate 25 MG tablet Commonly known as: TOPAMAX Take 25 mg by mouth daily.   traMADol 50 MG tablet Commonly known as: ULTRAM Take 50 mg by mouth 3 (three) times daily as needed for moderate pain.   Xarelto 20 MG Tabs tablet Generic drug: rivaroxaban Take 1 tablet (20 mg total) by mouth daily.        Disposition   The patient will be discharged in stable condition to home. Discharge Instructions     (  HEART FAILURE PATIENTS) Call MD:  Anytime you have any of the following symptoms: 1) 3 pound weight gain in 24 hours or 5 pounds in 1 week 2) shortness of breath, with or without a dry hacking cough 3) swelling in the hands, feet or stomach 4) if you have to sleep on extra pillows at night in order to breathe.   Complete by: As directed    Diet - low sodium heart healthy   Complete by: As directed    Heart Failure patients record your daily weight using the same scale at the same time of day   Complete by: As directed    Increase activity slowly   Complete by: As directed        Follow-up Information     [facility] Follow up on 05/26/2020.   Specialty: Cardiology Why: at 3:30 Garage Code 1111 Contact information: [address] [id number] [place] [address] [phone]                  Duration of Discharge Encounter: Greater than 35 minutes   Signed, Tonye Becket NP-C  05/18/2020, 12:44 PM   Agree with above. See my rounding note fro this morning for further details. F/u HF Clinic.   Arvilla Meres, MD  9:26 PM

## 2020-05-18 NOTE — Telephone Encounter (Signed)
Patient Advocate Encounter   Received notification that prior authorization for Marcelline Deist is required.   PA submitted on CoverMyMeds Key [id number] Status is pending   Will continue to follow.   Karle Plumber, PharmD, BCPS, BCCP, CPP Heart Failure Clinic Pharmacist [phone]

## 2020-05-26 ENCOUNTER — Encounter (HOSPITAL_COMMUNITY): Payer: Self-pay

## 2020-05-26 ENCOUNTER — Ambulatory Visit (HOSPITAL_COMMUNITY)
Admit: 2020-05-26 | Discharge: 2020-05-26 | Disposition: A | Discharge status: Home / Self Care | Payer: BC Managed Care – PPO | Source: Ambulatory Visit | Attending: Cardiology | Admitting: Cardiology

## 2020-05-26 ENCOUNTER — Other Ambulatory Visit: Payer: Self-pay

## 2020-05-26 VITALS — BP 138/98 | HR 68 | Wt 293.4 lb

## 2020-05-26 DIAGNOSIS — Z6838 Body mass index (BMI) 38.0-38.9, adult: Secondary | ICD-10-CM | POA: Insufficient documentation

## 2020-05-26 DIAGNOSIS — Z7984 Long term (current) use of oral hypoglycemic drugs: Secondary | ICD-10-CM | POA: Insufficient documentation

## 2020-05-26 DIAGNOSIS — I110 Hypertensive heart disease with heart failure: Secondary | ICD-10-CM | POA: Insufficient documentation

## 2020-05-26 DIAGNOSIS — R001 Bradycardia, unspecified: Secondary | ICD-10-CM | POA: Diagnosis not present

## 2020-05-26 DIAGNOSIS — I480 Paroxysmal atrial fibrillation: Secondary | ICD-10-CM

## 2020-05-26 DIAGNOSIS — Z79899 Other long term (current) drug therapy: Secondary | ICD-10-CM | POA: Insufficient documentation

## 2020-05-26 DIAGNOSIS — Z95810 Presence of automatic (implantable) cardiac defibrillator: Secondary | ICD-10-CM | POA: Insufficient documentation

## 2020-05-26 DIAGNOSIS — I5022 Chronic systolic (congestive) heart failure: Secondary | ICD-10-CM | POA: Insufficient documentation

## 2020-05-26 DIAGNOSIS — Z7901 Long term (current) use of anticoagulants: Secondary | ICD-10-CM | POA: Insufficient documentation

## 2020-05-26 DIAGNOSIS — E669 Obesity, unspecified: Secondary | ICD-10-CM | POA: Diagnosis not present

## 2020-05-26 DIAGNOSIS — Z881 Allergy status to other antibiotic agents status: Secondary | ICD-10-CM | POA: Diagnosis not present

## 2020-05-26 DIAGNOSIS — Z880 Allergy status to penicillin: Secondary | ICD-10-CM | POA: Insufficient documentation

## 2020-05-26 LAB — CBC
HCT: 53.2 % — ABNORMAL HIGH (ref 39.0–52.0)
Hemoglobin: 18.1 g/dL — ABNORMAL HIGH (ref 13.0–17.0)
MCH: 29.8 pg (ref 26.0–34.0)
MCHC: 34 g/dL (ref 30.0–36.0)
MCV: 87.5 fL (ref 80.0–100.0)
Platelets: 325 10*3/uL (ref 150–400)
RBC: 6.08 MIL/uL — ABNORMAL HIGH (ref 4.22–5.81)
RDW: 13.2 % (ref 11.5–15.5)
WBC: 6.9 10*3/uL (ref 4.0–10.5)
nRBC: 0 % (ref 0.0–0.2)

## 2020-05-26 LAB — COMPREHENSIVE METABOLIC PANEL
ALT: 105 U/L — ABNORMAL HIGH (ref 0–44)
AST: 62 U/L — ABNORMAL HIGH (ref 15–41)
Albumin: 4.5 g/dL (ref 3.5–5.0)
Alkaline Phosphatase: 59 U/L (ref 38–126)
Anion gap: 11 (ref 5–15)
BUN: 17 mg/dL (ref 6–20)
CO2: 22 mmol/L (ref 22–32)
Calcium: 9.5 mg/dL (ref 8.9–10.3)
Chloride: 102 mmol/L (ref 98–111)
Creatinine, Ser: 1.15 mg/dL (ref 0.61–1.24)
GFR, Estimated: >60 mL/min (ref >60)
Glucose, Bld: 90 mg/dL (ref 70–99)
Potassium: 4.1 mmol/L (ref 3.5–5.1)
Sodium: 135 mmol/L (ref 135–145)
Total Bilirubin: 0.9 mg/dL (ref 0.3–1.2)
Total Protein: 7.3 g/dL (ref 6.5–8.1)

## 2020-05-26 LAB — DIGOXIN LEVEL: Digoxin Level: 0.5 ng/mL — ABNORMAL LOW (ref 0.8–2.0)

## 2020-05-26 LAB — TSH: TSH: 9.863 u[IU]/mL — ABNORMAL HIGH (ref 0.350–4.500)

## 2020-05-26 MED ORDER — POTASSIUM CHLORIDE CRYS ER 20 MEQ PO TBCR: 20.0000 meq | EXTENDED_RELEASE_TABLET | ORAL | 3 refills | Status: DC | PRN

## 2020-05-26 MED ORDER — FUROSEMIDE 20 MG PO TABS: 20.0000 mg | ORAL_TABLET | ORAL | 11 refills | Status: DC | PRN

## 2020-05-26 NOTE — Progress Notes (Addendum)
Advanced Heart Failure Clinic Note   Referring Physician: PCP: Halford Chessman, MD PCP-Cardiologist: No primary care provider on file.  AHFC: Dr. Gala Romney  EP: Dr. Lalla Brothers   Reason for Visit: [facility]u for Systolic Heart Failure and Atrial Fibrillation    HPI:  Alexander [name] is a 45 year old with a history of obesity, HTN, hyperlipidemia, adjustment disorder, anxiety, and A fib w/ recent hospitalization for rapid afib leading to cardiogenic shock. He starting having issues mid Dec 2021. Says he had been drinking heavily and developed increased shortness of breath and chest tightness. Initially presented to [facility] on 04/24/20 with increased shortness of breath and chest tightness. EKG showed A Fib RVR. Started on diltiazem with plans for admit but he left AMA. Discharged on diltiazem + xarelto.   Presented to [facility] ED on 12/18/21with HF symptoms. EKG showed A Fib RVR. Admitted and started on diltiazem. ECHO showed EF 35-40%.EPconsulted. Started on metoprolol and losartan with plans to reassess in 3 weeks for possible cardioversion.   Sent back to [facility] ED from EP clinic on 05/13/20 with N/V and A fib RVR. On arrival he was cool and volume overloaed.  Started on amio drip + milrinone due to cardiogenic shock. Diuresed with IV lasix and later stopped with addition of GDMT. After amio load had successful cardioversion. Discharged home on po amio with taper. All HF meds provided through TOC.   Presents now to [facility] for post hospital f/u. In NSR/ sinus brady on EKG, HR 59 bpm. Has Apple Watch. No detection of abnormal rhythm post discharge. Reports feeling "great", the best he has felt "in a long time". Denies palpitations. No dyspnea w/ basic ADLs. Denies CP. Reports full med compliance. No side effects. Denies any further ETOH since being discharged. Wt is down from 297 lb day of d/c to 293 lb today. No LEE, orthopnea or PND. Wants to enroll in Cardiac rehab.     Review of  Systems: [y] = yes, [ ]  = no   General: Weight gain [ ] ; Weight loss [ ] ; Anorexia [ ] ; Fatigue [ ] ; Fever [ ] ; Chills [ ] ; Weakness [ ]   Cardiac: Chest pain/pressure [ ] ; Resting SOB [ ] ; Exertional SOB [ ] ; Orthopnea [ ] ; Pedal Edema [ ] ; Palpitations [ ] ; Syncope [ ] ; Presyncope [ ] ; Paroxysmal nocturnal dyspnea[ ]   Pulmonary: Cough [ ] ; Wheezing[ ] ; Hemoptysis[ ] ; Sputum [ ] ; Snoring [ ]   GI: Vomiting[ ] ; Dysphagia[ ] ; Melena[ ] ; Hematochezia [ ] ; Heartburn[ ] ; Abdominal pain [ ] ; Constipation [ ] ; Diarrhea [ ] ; BRBPR [ ]   GU: Hematuria[ ] ; Dysuria [ ] ; Nocturia[ ]   Vascular: Pain in legs with walking [ ] ; Pain in feet with lying flat [ ] ; Non-healing sores [ ] ; Stroke [ ] ; TIA [ ] ; Slurred speech [ ] ;  Neuro: Headaches[ ] ; Vertigo[ ] ; Seizures[ ] ; Paresthesias[ ] ;Blurred vision [ ] ; Diplopia [ ] ; Vision changes [ ]   Ortho/Skin: Arthritis [ ] ; Joint pain [ ] ; Muscle pain [ ] ; Joint swelling [ ] ; Back Pain [ ] ; Rash [ ]   Psych: Depression[ ] ; Anxiety[ ]   Heme: Bleeding problems [ ] ; Clotting disorders [ ] ; Anemia [ ]   Endocrine: Diabetes [ ] ; Thyroid dysfunction[ ]    Past Medical History:  Diagnosis Date  . Atrial fibrillation (HCC)   . Gout   . Hyperlipidemia   . Hypertension     Current Outpatient Medications  Medication Sig Dispense Refill  . acetaminophen (TYLENOL) 500  MG tablet Take 500 mg by mouth every 6 (six) hours as needed for moderate pain.    Marland Kitchen allopurinol (ZYLOPRIM) 100 MG tablet Take 100 mg by mouth daily.    Marland Kitchen amiodarone (PACERONE) 200 MG tablet Take 400 mg twice a day x2 weeks then 200 mg twice a day x2 weeks then 200 mg daily 90 tablet 6  . bismuth subsalicylate (PEPTO BISMOL) 262 MG chewable tablet Chew 524 mg by mouth daily as needed for indigestion.    Marland Kitchen buPROPion (WELLBUTRIN XL) 300 MG 24 hr tablet Take 300 mg by mouth daily.    . carvedilol (COREG) 3.125 MG tablet Take 1 tablet (3.125 mg total) by mouth 2 (two) times daily with a meal. 60 tablet 6  . clonazePAM  (KLONOPIN) 0.5 MG tablet Take 0.5 mg by mouth 2 (two) times daily as needed for anxiety.    . colchicine 0.6 MG tablet Take 0.6 mg by mouth 2 (two) times daily as needed (gout).    . dapagliflozin propanediol (FARXIGA) 10 MG TABS tablet Take 1 tablet (10 mg total) by mouth daily. 30 tablet 6  . digoxin (LANOXIN) 0.125 MG tablet Take 1 tablet (0.125 mg total) by mouth daily. 30 tablet 6  . fenofibrate (TRICOR) 145 MG tablet Take 145 mg by mouth daily.    . sacubitril-valsartan (ENTRESTO) 97-103 MG Take 1 tablet by mouth 2 (two) times daily. 60 tablet 6  . spironolactone (ALDACTONE) 25 MG tablet Take 1 tablet (25 mg total) by mouth daily. 30 tablet 6  . XARELTO 20 MG TABS tablet Take 1 tablet (20 mg total) by mouth daily. 30 tablet 3  . traMADol (ULTRAM) 50 MG tablet Take 50 mg by mouth 3 (three) times daily as needed for moderate pain. (Patient not taking: Reported on 05/26/2020)     No current facility-administered medications for this encounter.    Allergies  Allergen Reactions  . Albuterol Anaphylaxis  . Bee Venom Anaphylaxis  . Metaproterenol Anaphylaxis  . Other Anaphylaxis    DEET  . Amoxicillin-Pot Clavulanate Itching    OK with Amoxicillin  . Cephalexin Other (See Comments)    Chest tightness  . Amoxicillin Itching and Rash    Itching all over        Social History   Socioeconomic History  . Marital status: Divorced    Spouse name: Not on file  . Number of children: Not on file  . Years of education: Not on file  . Highest education level: Not on file  Occupational History  . Not on file  Tobacco Use  . Smoking status: Never Smoker  . Smokeless tobacco: Never Used  Substance and Sexual Activity  . Alcohol use: Yes    Alcohol/week: 6.0 - 12.0 standard drinks    Types: 6 - 12 Cans of beer per week    Comment: on the weekend only.  . Drug use: Not Currently  . Sexual activity: Not on file  Other Topics Concern  . Not on file  Social History Narrative  . Not on  file   Social Determinants of Health   Financial Resource Strain: Not on file  Food Insecurity: Not on file  Transportation Needs: Not on file  Physical Activity: Not on file  Stress: Not on file  Social Connections: Not on file  Intimate Partner Violence: Not on file      Family History  Problem Relation Age of Onset  . COPD Mother     Vitals:  05/26/20 1459  BP: (!) 138/98  Pulse: 68  SpO2: 98%  Weight: 133.1 kg (293 lb 6.4 oz)     PHYSICAL EXAM: General:  Well appearing, moderately obese young WM. No respiratory difficulty HEENT: normal Neck: supple. no JVD. Carotids 2+ bilat; no bruits. No lymphadenopathy or thyromegaly appreciated. Cor: PMI nondisplaced. Regular rate & rhythm. No rubs, gallops or murmurs. Lungs: clear Abdomen: soft, nontender, nondistended. No hepatosplenomegaly. No bruits or masses. Good bowel sounds. Extremities: no cyanosis, clubbing, rash, edema Neuro: alert & oriented x 3, cranial nerves grossly intact. moves all 4 extremities w/o difficulty. Affect pleasant.   ECG: Sinus Bradycardia 59 bpm    ASSESSMENT & PLAN:  1. 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 NYHA Class II. Volume status good. Euvolemic on exam  - Maintaining NSR post DCCV - Continue GDMT w/ plans to repeat echo in ~2 months. If EF remains reduced, plan R/LHC - Continue Entresto 97-103 bid - Farxiga 10 mg daily  - Spiro 25 mg daily  - Coreg 3.125 mg bid (bradycardia limits titration) - Digoxin 0.125 mg daily. Check Dig level today  - Check CMP today  - also needs sleep study to r/o OSA. Will arrange - encouraged to continue to avoid ETOH - will prescribe 20 mg lasix + 20 mEq KCl to take PRN   2. PAF - s/p  DCCV 1/22 - maintaining NSR on EKG today. HR well controlled - continue PO amio w/ taper. Will continue 400 mg bid through 1/25>>200 mg bid x 2 weeks>>200 mg daily. Check TSH and HFTs today   - Continue Xarelto, Check CBC - Amiodarone not great long term option given young age - he has f/u w/ EP on 1/31 to discuss long term plan, consider ablation if recurrence  - he denies any ETOH use since discharge - arrange sleep study to r/o OSA   3. Obesity Body mass index is 38.71 kg/m. - encouraged physical activity. He is interested in cardiac rehab, will place referral  F/u w/ APP in 4 weeks, f/u in 8 weeks w/ Dr. Gala Romney and repeat echo       Robbie Lis, PA-C 05/26/20

## 2020-05-26 NOTE — Progress Notes (Signed)
Patient Name: Alexander [name]        DOB: [birth year]      Height: 6'1"    POEUMP:536  Office Name:Advanced Heart Failure Clinic         Referring Provider:Brittainy Simmons-PA/ Arvilla Meres MD  Today's Date: 05/26/2020   STOP BANG RISK ASSESSMENT S (snore) Have you been told that you snore?     YES   T (tired) Are you often tired, fatigued, or sleepy during the day?   YES  O (obstruction) Do you stop breathing, choke, or gasp during sleep? NO   P (pressure) Do you have or are you being treated for high blood pressure? YES   B (BMI) Is your body index greater than 35 kg/m? YES   A (age) Are you 38 years old or older? NO   N (neck) Do you have a neck circumference greater than 16 inches?   YES   G (gender) Are you a male? YES   TOTAL STOP/BANG "YES" ANSWERS                                                                        For Office Use Only              Procedure Order Form    YES to 3+ Stop Bang questions OR two clinical symptoms - patient qualifies for WatchPAT (CPT 95800)     Submit: This Form + Patient Face Sheet + Clinical Note via CloudPAT or Fax: [phone]         Clinical Notes: Will consult Sleep Specialist and refer for management of therapy due to patient increased risk of Sleep Apnea. Ordering a sleep study due to the following two clinical symptoms: Loud snoring R06.83 Unrefreshed by sleep G47.8 / History of high blood pressure R03.0 / Insomnia G47.00    I understand that I am proceeding with a home sleep apnea test as ordered by my treating physician. I understand that untreated sleep apnea is a serious cardiovascular risk factor and it is my responsibility to perform the test and seek management for sleep apnea. I will be contacted with the results and be managed for sleep apnea by a local sleep physician. I will be receiving equipment and further instructions from [facility]. I shall promptly ship back the equipment via the included mailing label.  I understand my insurance will be billed for the test and as the patient I am responsible for any insurance related out-of-pocket costs incurred. I have been provided with written instructions and can call for additional video or telephonic instruction, with 24-hour availability of qualified personnel to answer any questions: Patient Help Desk [phone].  Patient Signature ______________________________________________________   Date______________________ Patient Telemedicine Verbal Consent

## 2020-05-26 NOTE — Addendum Note (Signed)
Encounter addended by: Allayne Butcher, PA-C on: 05/26/2020 4:06 PM  Actions taken: Clinical Note Signed

## 2020-05-26 NOTE — Patient Instructions (Signed)
START Lasix 20 mg, one tab daily AS NEEDED for shortness of breath, swelling or weight gain (3lbs overnight or 5lbs in a week) START Potassium 20 meq, one tab AS NEEDED with every lasix dose  Labs today We will only contact you if something comes back abnormal or we need to make some changes. Otherwise no news is good news!   Your physician has recommended that you have a sleep study. This test records several body functions during sleep, including: brain activity, eye movement, oxygen and carbon dioxide blood levels, heart rate and rhythm, breathing rate and rhythm, the flow of air through your mouth and nose, snoring, body muscle movements, and chest and belly movement.  Your physician recommends that you schedule a follow-up appointment in: 4 weeks  in the Advanced Practitioners (PA/NP) Clinic   Your physician recommends that you schedule a follow-up appointment in: 2-3 months with Dr Gala Romney and echo  Your physician has requested that you have an echocardiogram. Echocardiography is a painless test that uses sound waves to create images of your heart. It provides your doctor with information about the size and shape of your heart and how well your heart's chambers and valves are working. This procedure takes approximately one hour. There are no restrictions for this procedure.  If you have any questions or concerns before your next appointment please send [place] a message through [place] or call our office at [phone].    TO LEAVE A MESSAGE FOR THE NURSE SELECT OPTION 2, PLEASE LEAVE A MESSAGE INCLUDING: . YOUR NAME . DATE OF BIRTH . CALL BACK NUMBER . REASON FOR CALL**this is important as we prioritize the call backs  YOU WILL RECEIVE A CALL BACK THE SAME DAY AS LONG AS YOU CALL BEFORE 4:00 PM

## 2020-05-29 ENCOUNTER — Other Ambulatory Visit (HOSPITAL_COMMUNITY): Payer: Self-pay | Admitting: *Deleted

## 2020-05-29 DIAGNOSIS — I5041 Acute combined systolic (congestive) and diastolic (congestive) heart failure: Secondary | ICD-10-CM

## 2020-05-29 NOTE — Addendum Note (Signed)
Encounter addended by: Theresia Bough, CMA on: 05/29/2020 12:19 PM  Actions taken: Result note filed, Order list changed, Diagnosis association updated

## 2020-06-08 ENCOUNTER — Encounter: Payer: Self-pay | Admitting: Cardiology

## 2020-06-08 ENCOUNTER — Other Ambulatory Visit: Payer: Self-pay

## 2020-06-08 ENCOUNTER — Ambulatory Visit: Payer: BC Managed Care – PPO | Admitting: Cardiology

## 2020-06-08 ENCOUNTER — Encounter (HOSPITAL_COMMUNITY): Payer: Self-pay

## 2020-06-08 VITALS — BP 136/86 | HR 64 | Ht 73.0 in | Wt 288.0 lb

## 2020-06-08 DIAGNOSIS — Z79899 Other long term (current) drug therapy: Secondary | ICD-10-CM | POA: Diagnosis not present

## 2020-06-08 DIAGNOSIS — I4891 Unspecified atrial fibrillation: Secondary | ICD-10-CM

## 2020-06-08 DIAGNOSIS — I5022 Chronic systolic (congestive) heart failure: Secondary | ICD-10-CM

## 2020-06-08 NOTE — Patient Instructions (Signed)
Medication Instructions:  Your physician recommends that you continue on your current medications as directed. Please refer to the Current Medication list given to you today.  *If you need a refill on your cardiac medications before your next appointment, please call your pharmacy*   Lab Work: CMET, TSH, Free T4, Digoxin level  If you have labs (blood work) drawn today and your tests are completely normal, you will receive your results only by: Marland Kitchen MyChart Message (if you have MyChart) OR . A paper copy in the mail If you have any lab test that is abnormal or we need to change your treatment, we will call you to review the results.   Testing/Procedures: None ordered.    Follow-Up: At [facility], you and your health needs are our priority.  As part of our continuing mission to provide you with exceptional heart care, we have created designated Provider Care Teams.  These Care Teams include your primary Cardiologist (physician) and Advanced Practice Providers (APPs -  Physician Assistants and Nurse Practitioners) who all work together to provide you with the care you need, when you need it.  We recommend signing up for the patient portal called "MyChart".  Sign up information is provided on this After Visit Summary.  MyChart is used to connect with patients for Virtual Visits (Telemedicine).  Patients are able to view lab/test results, encounter notes, upcoming appointments, etc.  Non-urgent messages can be sent to your provider as well.   To learn more about what you can do with MyChart, go to [email or web address].    Your next appointment:   3 month(s)  The format for your next appointment:   In Person  Provider:   Steffanie Dunn, MD

## 2020-06-08 NOTE — Progress Notes (Signed)
Electrophysiology Office Follow up Visit Note:    Date:  06/08/2020   ID:  Alexander [name], DOB [birth year], MRN [id number]  PCP:  Halford Chessman, MD  [facility] Cardiologist:  No primary care provider on file.  [facility] Electrophysiologist:  Lanier Prude, MD    Interval History:    Alexander [name] is a 45 y.o. male who presents for a follow up visit. They were last seen in clinic May 13, 2020.  At that appointment, the patient was referred urgently to the hospital where he was hospitalized in the ICU for cardiogenic shock.  Since leaving the hospital he has been doing very well.  He reports he is steadily lost weight and has significantly improved shortness of breath, edema, fatigue.  He feels much better now that he is in normal rhythm and on good medical therapy.   Past Medical History:  Diagnosis Date  . Atrial fibrillation (HCC)   . Gout   . Hyperlipidemia   . Hypertension     Past Surgical History:  Procedure Laterality Date  . CARDIOVERSION N/A 05/15/2020   Procedure: CARDIOVERSION;  Surgeon: Dolores Patty, MD;  Location: [facility] ENDOSCOPY;  Service: Cardiovascular;  Laterality: N/A;  . SEPTOPLASTY      Current Medications: Current Meds  Medication Sig  . acetaminophen (TYLENOL) 500 MG tablet Take 500 mg by mouth every 6 (six) hours as needed for moderate pain.  Marland Kitchen allopurinol (ZYLOPRIM) 100 MG tablet Take 100 mg by mouth daily.  Marland Kitchen amiodarone (PACERONE) 200 MG tablet Take 400 mg twice a day x2 weeks then 200 mg twice a day x2 weeks then 200 mg daily  . bismuth subsalicylate (PEPTO BISMOL) 262 MG chewable tablet Chew 524 mg by mouth daily as needed for indigestion.  Marland Kitchen buPROPion (WELLBUTRIN XL) 300 MG 24 hr tablet Take 300 mg by mouth daily.  . carvedilol (COREG) 3.125 MG tablet Take 1 tablet (3.125 mg total) by mouth 2 (two) times daily with a meal.  . clonazePAM (KLONOPIN) 0.5 MG tablet Take 0.5 mg by mouth 2 (two) times daily as needed for anxiety.  .  colchicine 0.6 MG tablet Take 0.6 mg by mouth 2 (two) times daily as needed (gout).  . dapagliflozin propanediol (FARXIGA) 10 MG TABS tablet Take 1 tablet (10 mg total) by mouth daily.  . digoxin (LANOXIN) 0.125 MG tablet Take 1 tablet (0.125 mg total) by mouth daily.  . fenofibrate (TRICOR) 145 MG tablet Take 145 mg by mouth daily.  . furosemide (LASIX) 20 MG tablet Take 1 tablet (20 mg total) by mouth as needed for fluid or edema.  . potassium chloride SA (KLOR-CON) 20 MEQ tablet Take 1 tablet (20 mEq total) by mouth as needed (with Lasix).  . sacubitril-valsartan (ENTRESTO) 97-103 MG Take 1 tablet by mouth 2 (two) times daily.  Marland Kitchen spironolactone (ALDACTONE) 25 MG tablet Take 1 tablet (25 mg total) by mouth daily.  . traMADol (ULTRAM) 50 MG tablet Take 50 mg by mouth 3 (three) times daily as needed for moderate pain.  Marland Kitchen XARELTO 20 MG TABS tablet Take 1 tablet (20 mg total) by mouth daily.     Allergies:   Albuterol, Bee venom, Metaproterenol, Other, Amoxicillin-pot clavulanate, Cephalexin, and Amoxicillin   Social History   Socioeconomic History  . Marital status: Divorced    Spouse name: Not on file  . Number of children: Not on file  . Years of education: Not on file  . Highest education level: Not on file  Occupational History  . Not on file  Tobacco Use  . Smoking status: Never Smoker  . Smokeless tobacco: Never Used  Substance and Sexual Activity  . Alcohol use: Yes    Alcohol/week: 6.0 - 12.0 standard drinks    Types: 6 - 12 Cans of beer per week    Comment: on the weekend only.  . Drug use: Not Currently  . Sexual activity: Not on file  Other Topics Concern  . Not on file  Social History Narrative  . Not on file   Social Determinants of Health   Financial Resource Strain: Not on file  Food Insecurity: Not on file  Transportation Needs: Not on file  Physical Activity: Not on file  Stress: Not on file  Social Connections: Not on file     Family History: The  patient's family history includes COPD in his mother.  ROS:   Please see the history of present illness.    All other systems reviewed and are negative.  EKGs/Labs/Other Studies Reviewed:    The following studies were reviewed today:   EKG:  The ekg ordered today demonstrates sinus rhythm.  Recent Labs: 05/15/2020: Magnesium 2.1 05/26/2020: ALT 105; BUN 17; Creatinine, Ser 1.15; Hemoglobin 18.1; Platelets 325; Potassium 4.1; Sodium 135; TSH 9.863  Recent Lipid Panel No results found for: CHOL, TRIG, HDL, CHOLHDL, VLDL, LDLCALC, LDLDIRECT  Physical Exam:    VS:  BP 136/86   Pulse 64   Ht 6\' 1"  (1.854 m)   Wt 288 lb (130.6 kg)   SpO2 97%   BMI 38.00 kg/m     Wt Readings from Last 3 Encounters:  06/08/20 288 lb (130.6 kg)  05/26/20 293 lb 6.4 oz (133.1 kg)  05/18/20 299 lb 9.6 oz (135.9 kg)     GEN:  Well nourished, well developed in no acute distress.  Obese HEENT: Normal NECK: No JVD; No carotid bruits LYMPHATICS: No lymphadenopathy CARDIAC: RRR, no murmurs, rubs, gallops.  Warm extremities without edema. RESPIRATORY:  Clear to auscultation without rales, wheezing or rhonchi  ABDOMEN: Soft, non-tender, non-distended MUSCULOSKELETAL:  No edema; No deformity  SKIN: Warm and dry NEUROLOGIC:  Alert and oriented x 3 PSYCHIATRIC:  Normal affect   ASSESSMENT:    1. Chronic systolic congestive heart failure (HCC)   2. Atrial fibrillation with RVR (HCC)   3. Medication management    PLAN:    In order of problems listed above:  1. Chronic combined systolic and diastolic heart failure NYHA class II symptoms today.  Warm and dry on my exam.  He is on good medical therapy.  We will plan to see him back after his scheduled echocardiogram in approximately 3 months to see if his EF has improved and how he is feeling.  If he has had no recovery in his ejection fraction, will need to discuss the ICD implant for primary prevention of sudden cardiac death.  This was discussed with  the patient during today's visit and he is understanding of the plan.  2.  Persistent atrial fibrillation Maintaining sinus rhythm after cardioversion and now on amiodarone and digoxin. Check complete metabolic panel, TSH, free T4, digoxin level. Continue Xarelto for stroke prophylaxis.  Follow-up 3 months  Medication Adjustments/Labs and Tests Ordered: Current medicines are reviewed at length with the patient today.  Concerns regarding medicines are outlined above.  Orders Placed This Encounter  Procedures  . Comprehensive metabolic panel  . TSH  . Digoxin level  . T4, free  .  EKG 12-Lead   No orders of the defined types were placed in this encounter.    Signed, Steffanie Dunn, MD, [facility]  06/08/2020 1:00 PM    Electrophysiology [facility]

## 2020-06-09 ENCOUNTER — Telehealth (HOSPITAL_COMMUNITY): Payer: Self-pay | Admitting: *Deleted

## 2020-06-09 LAB — COMPREHENSIVE METABOLIC PANEL
ALT: 58 IU/L — ABNORMAL HIGH (ref 0–44)
AST: 40 IU/L (ref 0–40)
Albumin/Globulin Ratio: 2.5 — ABNORMAL HIGH (ref 1.2–2.2)
Albumin: 4.9 g/dL (ref 4.0–5.0)
Alkaline Phosphatase: 57 IU/L (ref 44–121)
BUN/Creatinine Ratio: 10 (ref 9–20)
BUN: 13 mg/dL (ref 6–24)
Bilirubin Total: 0.6 mg/dL (ref 0.0–1.2)
CO2: 20 mmol/L (ref 20–29)
Calcium: 9.6 mg/dL (ref 8.7–10.2)
Chloride: 102 mmol/L (ref 96–106)
Creatinine, Ser: 1.33 mg/dL — ABNORMAL HIGH (ref 0.76–1.27)
GFR calc Af Amer: 75 mL/min/{1.73_m2} (ref >59)
GFR calc non Af Amer: 65 mL/min/{1.73_m2} (ref >59)
Globulin, Total: 2 g/dL (ref 1.5–4.5)
Glucose: 90 mg/dL (ref 65–99)
Potassium: 4.4 mmol/L (ref 3.5–5.2)
Sodium: 141 mmol/L (ref 134–144)
Total Protein: 6.9 g/dL (ref 6.0–8.5)

## 2020-06-09 LAB — T4, FREE: Free T4: 1.36 ng/dL (ref 0.82–1.77)

## 2020-06-09 LAB — TSH: TSH: 6.06 u[IU]/mL — ABNORMAL HIGH (ref 0.450–4.500)

## 2020-06-09 LAB — DIGOXIN LEVEL: Digoxin, Serum: 0.8 ng/mL (ref 0.5–0.9)

## 2020-06-09 NOTE — Telephone Encounter (Signed)
Sleep study pre cert in clinical review case # [id number]

## 2020-06-14 ENCOUNTER — Encounter (HOSPITAL_COMMUNITY): Payer: Self-pay

## 2020-06-15 ENCOUNTER — Telehealth (HOSPITAL_COMMUNITY): Payer: Self-pay

## 2020-06-15 ENCOUNTER — Other Ambulatory Visit (HOSPITAL_COMMUNITY): Payer: Self-pay | Admitting: *Deleted

## 2020-06-15 MED ORDER — SACUBITRIL-VALSARTAN 97-103 MG PO TABS: 1.0000 | ORAL_TABLET | Freq: Two times a day (BID) | ORAL | 6 refills | Status: DC

## 2020-06-15 MED ORDER — SPIRONOLACTONE 25 MG PO TABS: 25.0000 mg | ORAL_TABLET | Freq: Every day | ORAL | 6 refills | Status: DC

## 2020-06-15 MED ORDER — DIGOXIN 125 MCG PO TABS: 0.1250 mg | ORAL_TABLET | Freq: Every day | ORAL | 6 refills | Status: DC

## 2020-06-15 MED ORDER — DAPAGLIFLOZIN PROPANEDIOL 10 MG PO TABS: 10.0000 mg | ORAL_TABLET | Freq: Every day | ORAL | 6 refills | Status: DC

## 2020-06-15 MED ORDER — AMIODARONE HCL 200 MG PO TABS: ORAL_TABLET | ORAL | 6 refills | Status: DC

## 2020-06-15 MED ORDER — XARELTO 20 MG PO TABS: 20.0000 mg | ORAL_TABLET | Freq: Every day | ORAL | 3 refills | Status: DC

## 2020-06-15 MED ORDER — CARVEDILOL 3.125 MG PO TABS: 3.1250 mg | ORAL_TABLET | Freq: Two times a day (BID) | ORAL | 6 refills | Status: DC

## 2020-06-15 NOTE — Telephone Encounter (Signed)
Called patient to see if he was interested in participating in the Cardiac Rehab Program. Patient stated yes. Patient will come in for orientation on 07/16/20 @ 1:15PM and will attend the 11:15AM exercise class.  Pensions consultant.

## 2020-06-17 ENCOUNTER — Encounter (HOSPITAL_COMMUNITY): Payer: Self-pay

## 2020-06-19 ENCOUNTER — Other Ambulatory Visit (HOSPITAL_COMMUNITY): Payer: Self-pay | Admitting: *Deleted

## 2020-06-19 DIAGNOSIS — G4733 Obstructive sleep apnea (adult) (pediatric): Secondary | ICD-10-CM

## 2020-06-20 ENCOUNTER — Encounter (HOSPITAL_COMMUNITY): Payer: Self-pay

## 2020-06-23 ENCOUNTER — Encounter (HOSPITAL_COMMUNITY): Payer: BC Managed Care – PPO

## 2020-07-02 ENCOUNTER — Telehealth (HOSPITAL_COMMUNITY): Payer: Self-pay | Admitting: Student-PharmD

## 2020-07-02 NOTE — Progress Notes (Signed)
Advanced Heart Failure Clinic Note   PCP: Halford Chessman, MD [facility]: Dr. Gala Romney  EP: Dr. Lalla Brothers   Reason for Visit: [facility] F/u for Systolic Heart Failure and Atrial Fibrillation   HPI:  Mr Reinheimer is a 45 year old with a history of systolic heart failure, obesity, HTN, hyperlipidemia, adjustment disorder, anxiety, and A fib.  Hospitalization (04/24/20) for rapid afib leading to cardiogenic shock. He had been drinking heavily, developed increased SOB and chest tightness. Presented to [facility], EKG showed A Fib RVR. Started on diltiazem with plans for admit but he left AMA. Discharged on diltiazem + Xarelto.   Presented to [facility] ED on 12/18/21with HF symptoms. EKG showed A Fib RVR. Admitted and started on diltiazem. Echo showed EF 35-40%.EPconsulted. Started on metoprolol and losartan with plans to reassess in 3 weeks for possible cardioversion.   Sent back to [facility] ED from EP clinic on 05/13/20 with N/V and A fib RVR. On arrival he was cool and volume overloaed.  Started on amio drip + milrinone due to cardiogenic shock. Diuresed with IV lasix and later stopped with addition of GDMT. After amio load had successful cardioversion. Discharged home on po amio with taper. All HF meds provided through TOC.    Today he returns for HF follow up. Post hospital follow up last month he was feeling well and wanting to enroll in CR. Today, overall feeling "great.". Will start CR next month. Has been walking 2-3 miles 4-5x/week, stretching and doing yoga. Denies increasing SOB, CP, dizziness, edema, or PND/Orthopnea. Appetite ok. No fever or chills. Weight at home ~283-288 pounds. Taking all medications. No more ETOH since discharge. Took prn lasix once last week after a salty meal.   ROS: All systems reviewed and negative except as per HPI.   Past Medical History:  Diagnosis Date  . Atrial fibrillation (HCC)   . Gout   . Hyperlipidemia   . Hypertension     Current Outpatient  Medications  Medication Sig Dispense Refill  . acetaminophen (TYLENOL) 500 MG tablet Take 500 mg by mouth every 6 (six) hours as needed for moderate pain.    Marland Kitchen allopurinol (ZYLOPRIM) 100 MG tablet Take 100 mg by mouth daily.    Marland Kitchen amiodarone (PACERONE) 200 MG tablet Take 200 mg by mouth daily.    Marland Kitchen bismuth subsalicylate (PEPTO BISMOL) 262 MG chewable tablet Chew 524 mg by mouth daily as needed for indigestion.    Marland Kitchen buPROPion (WELLBUTRIN XL) 300 MG 24 hr tablet Take 300 mg by mouth daily.    . carvedilol (COREG) 3.125 MG tablet Take 1 tablet (3.125 mg total) by mouth 2 (two) times daily with a meal. 60 tablet 6  . clonazePAM (KLONOPIN) 0.5 MG tablet Take 0.5 mg by mouth 2 (two) times daily as needed for anxiety.    . colchicine 0.6 MG tablet Take 0.6 mg by mouth 2 (two) times daily as needed (gout).    . dapagliflozin propanediol (FARXIGA) 10 MG TABS tablet Take 1 tablet (10 mg total) by mouth daily. 30 tablet 6  . digoxin (LANOXIN) 0.125 MG tablet Take 1 tablet (0.125 mg total) by mouth daily. 30 tablet 6  . fenofibrate (TRICOR) 145 MG tablet Take 145 mg by mouth daily.    . furosemide (LASIX) 20 MG tablet Take 1 tablet (20 mg total) by mouth as needed for fluid or edema. 30 tablet 11  . potassium chloride SA (KLOR-CON) 20 MEQ tablet Take 1 tablet (20 mEq total)  by mouth as needed (with Lasix). 90 tablet 3  . sacubitril-valsartan (ENTRESTO) 97-103 MG Take 1 tablet by mouth 2 (two) times daily. 60 tablet 6  . spironolactone (ALDACTONE) 25 MG tablet Take 1 tablet (25 mg total) by mouth daily. 30 tablet 6  . traMADol (ULTRAM) 50 MG tablet Take 50 mg by mouth 3 (three) times daily as needed for moderate pain.    Marland Kitchen XARELTO 20 MG TABS tablet Take 1 tablet (20 mg total) by mouth daily. 30 tablet 3   No current facility-administered medications for this encounter.    Allergies  Allergen Reactions  . Albuterol Anaphylaxis  . Bee Venom Anaphylaxis  . Metaproterenol Anaphylaxis  . Other Anaphylaxis     DEET  . Amoxicillin-Pot Clavulanate Itching    OK with Amoxicillin  . Cephalexin Other (See Comments)    Chest tightness  . Amoxicillin Itching and Rash    Itching all over       Social History   Socioeconomic History  . Marital status: Divorced    Spouse name: Not on file  . Number of children: Not on file  . Years of education: Not on file  . Highest education level: Not on file  Occupational History  . Not on file  Tobacco Use  . Smoking status: Never Smoker  . Smokeless tobacco: Never Used  Substance and Sexual Activity  . Alcohol use: Yes    Alcohol/week: 6.0 - 12.0 standard drinks    Types: 6 - 12 Cans of beer per week    Comment: on the weekend only.  . Drug use: Not Currently  . Sexual activity: Not on file  Other Topics Concern  . Not on file  Social History Narrative  . Not on file   Social Determinants of Health   Financial Resource Strain: Not on file  Food Insecurity: Not on file  Transportation Needs: Not on file  Physical Activity: Not on file  Stress: Not on file  Social Connections: Not on file  Intimate Partner Violence: Not on file   Family History  Problem Relation Age of Onset  . COPD Mother    Vitals:   07/03/20 1107  BP: 120/90  Pulse: 65  SpO2: 96%  Weight: 126.5 kg (278 lb 12.8 oz)   Wt Readings from Last 3 Encounters:  07/03/20 126.5 kg (278 lb 12.8 oz)  06/08/20 130.6 kg (288 lb)  05/26/20 133.1 kg (293 lb 6.4 oz)   PHYSICAL EXAM: General:  NAD. No resp difficulty HEENT: Normal Neck: Supple. No JVD, thick neck. Carotids 2+ bilat; no bruits. No lymphadenopathy or thryomegaly appreciated. Cor: PMI nondisplaced. Regular rate & rhythm. Heart tones distant. No rubs, gallops or murmurs. Lungs: Clear Abdomen: Obese, soft, nontender, nondistended. No hepatosplenomegaly. No bruits or masses. Good bowel sounds. Extremities: No cyanosis, clubbing, rash, edema Neuro: Alert & oriented x 3, cranial nerves grossly intact. Moves all  4 extremities w/o difficulty. Affect pleasant.   ECG: SR 65 bpm, no significant changes since last tracing (personally reviewed).  ASSESSMENT & PLAN:  1. 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  - NYHA Class I- II. Volume status good today on exam, weight down. - Continue lasix 20 mg prn.  - Continue Entresto 97-103 mg  bid. - Continue Farxiga 10 mg daily.  - Continue spiro 25 mg daily.  - Continue Coreg 3.125 mg bid. Will hold off on increasing further with HR 65. - Continue digoxin 0.125 mg daily.  - BMET & digoxin level today.  2. PAF - Maintaining NSR post DCCV. - Continue amiodarone 200 mg daily. If EF improves, may be able to stop. - TSH, CMET ok (1/22). - Continue Xarelto. No bleeding issues. - No further ETOH use since discharge. - Sleep study ordered. - Followed by EP, Dr. Lalla Brothers.  3. Obesity Body mass index is 36.78 kg/m. - Cncouraged continued physical activity, low-carb, & portion control.  - CR to start next month.  Follow up with Dr. Gala Romney next month as scheduled with echo. If EF remains reduced, plan R/LHC, and refer back to EP for ICD consideration.  Anderson Malta [place], FNP-BC 07/03/20

## 2020-07-03 ENCOUNTER — Other Ambulatory Visit: Payer: Self-pay

## 2020-07-03 ENCOUNTER — Encounter (HOSPITAL_COMMUNITY): Payer: Self-pay

## 2020-07-03 ENCOUNTER — Ambulatory Visit (HOSPITAL_COMMUNITY)
Admission: RE | Admit: 2020-07-03 | Discharge: 2020-07-03 | Disposition: A | Discharge status: Home / Self Care | Payer: BC Managed Care – PPO | Source: Ambulatory Visit | Attending: Family Medicine | Admitting: Family Medicine

## 2020-07-03 VITALS — BP 120/90 | HR 65 | Wt 278.8 lb

## 2020-07-03 DIAGNOSIS — E669 Obesity, unspecified: Secondary | ICD-10-CM | POA: Insufficient documentation

## 2020-07-03 DIAGNOSIS — Z7984 Long term (current) use of oral hypoglycemic drugs: Secondary | ICD-10-CM | POA: Diagnosis not present

## 2020-07-03 DIAGNOSIS — Z881 Allergy status to other antibiotic agents status: Secondary | ICD-10-CM | POA: Diagnosis not present

## 2020-07-03 DIAGNOSIS — F4322 Adjustment disorder with anxiety: Secondary | ICD-10-CM | POA: Insufficient documentation

## 2020-07-03 DIAGNOSIS — Z880 Allergy status to penicillin: Secondary | ICD-10-CM | POA: Insufficient documentation

## 2020-07-03 DIAGNOSIS — I480 Paroxysmal atrial fibrillation: Secondary | ICD-10-CM

## 2020-07-03 DIAGNOSIS — I5022 Chronic systolic (congestive) heart failure: Secondary | ICD-10-CM | POA: Diagnosis not present

## 2020-07-03 DIAGNOSIS — Z7901 Long term (current) use of anticoagulants: Secondary | ICD-10-CM | POA: Diagnosis not present

## 2020-07-03 DIAGNOSIS — Z6836 Body mass index (BMI) 36.0-36.9, adult: Secondary | ICD-10-CM | POA: Diagnosis not present

## 2020-07-03 DIAGNOSIS — I110 Hypertensive heart disease with heart failure: Secondary | ICD-10-CM | POA: Insufficient documentation

## 2020-07-03 DIAGNOSIS — Z79899 Other long term (current) drug therapy: Secondary | ICD-10-CM | POA: Insufficient documentation

## 2020-07-03 DIAGNOSIS — E785 Hyperlipidemia, unspecified: Secondary | ICD-10-CM | POA: Insufficient documentation

## 2020-07-03 LAB — DIGOXIN LEVEL: Digoxin Level: 0.6 ng/mL — ABNORMAL LOW (ref 0.8–2.0)

## 2020-07-03 LAB — BASIC METABOLIC PANEL
Anion gap: 11 (ref 5–15)
BUN: 16 mg/dL (ref 6–20)
CO2: 28 mmol/L (ref 22–32)
Calcium: 9.7 mg/dL (ref 8.9–10.3)
Chloride: 99 mmol/L (ref 98–111)
Creatinine, Ser: 1.33 mg/dL — ABNORMAL HIGH (ref 0.61–1.24)
GFR, Estimated: >60 mL/min (ref >60)
Glucose, Bld: 94 mg/dL (ref 70–99)
Potassium: 4.2 mmol/L (ref 3.5–5.1)
Sodium: 138 mmol/L (ref 135–145)

## 2020-07-03 NOTE — Telephone Encounter (Signed)
Cardiac Rehab Medication Review by a Pharmacist  Does the patient  feel that his/her medications are working for him/her?  yes  Has the patient been experiencing any side effects to the medications prescribed?  no  Does the patient measure his/her own blood pressure or blood glucose at home?  yes - BP daily, reports blood pressures 120s/80s-90s  Does the patient have any problems obtaining medications due to transportation or finances?   no  Understanding of regimen: good Understanding of indications: good Potential of compliance: good  Pharmacist Intervention: No medication interventions recommended at this time.   Alexander [name], PharmD PGY1 Pharmacy Resident 07/03/2020 4:23 PM  Please check [email or web address] for unit-specific pharmacy phone numbers.

## 2020-07-03 NOTE — Patient Instructions (Signed)
Labs done today, your results will be available in MyChart, we will contact you for abnormal readings.  Your follow up appointment has already been scheduled.  If you have any questions or concerns before your next appointment please send [place] a message through [place] or call our office at [phone].    TO LEAVE A MESSAGE FOR THE NURSE SELECT OPTION 2, PLEASE LEAVE A MESSAGE INCLUDING: . YOUR NAME . DATE OF BIRTH . CALL BACK NUMBER . REASON FOR CALL**this is important as we prioritize the call backs  YOU WILL RECEIVE A CALL BACK THE SAME DAY AS LONG AS YOU CALL BEFORE 4:00 PM

## 2020-07-16 ENCOUNTER — Other Ambulatory Visit: Payer: Self-pay

## 2020-07-16 ENCOUNTER — Encounter (HOSPITAL_COMMUNITY)
Admission: RE | Admit: 2020-07-16 | Discharge: 2020-07-16 | Disposition: A | Discharge status: Home / Self Care | Payer: BC Managed Care – PPO | Source: Ambulatory Visit | Attending: Internal Medicine | Admitting: Internal Medicine

## 2020-07-16 VITALS — BP 124/94 | Ht 72.0 in | Wt 288.8 lb

## 2020-07-16 DIAGNOSIS — I5042 Chronic combined systolic (congestive) and diastolic (congestive) heart failure: Secondary | ICD-10-CM | POA: Insufficient documentation

## 2020-07-16 HISTORY — DX: Cardiac arrhythmia, unspecified: I49.9

## 2020-07-16 HISTORY — DX: Heart failure, unspecified: I50.9

## 2020-07-17 ENCOUNTER — Encounter (HOSPITAL_COMMUNITY): Payer: Self-pay

## 2020-07-17 NOTE — Progress Notes (Signed)
Cardiac Individual Treatment Plan  Patient Details  Name: Alexander [name] MRN: [id number] Date of Birth: April 28, 1976 Referring Provider:   Flowsheet Row CARDIAC REHAB PHASE II ORIENTATION from 07/16/2020 in [place]  Referring Provider Bensimhon, Daniel MD      Initial Encounter Date:  [place] from 07/16/2020 in [place]  Date 07/16/20      Visit Diagnosis: Heart failure, systolic and diastolic, chronic ([facility])  Patient's Home Medications on Admission:  Current Outpatient Medications:  .  acetaminophen (TYLENOL) 500 MG tablet, Take 500 mg by mouth every 6 (six) hours as needed for moderate pain., Disp: , Rfl:  .  allopurinol (ZYLOPRIM) 100 MG tablet, Take 100 mg by mouth daily., Disp: , Rfl:  .  amiodarone (PACERONE) 200 MG tablet, Take 200 mg by mouth daily., Disp: , Rfl:  .  bismuth subsalicylate (PEPTO BISMOL) 262 MG chewable tablet, Chew 524 mg by mouth daily as needed for indigestion., Disp: , Rfl:  .  buPROPion (WELLBUTRIN XL) 300 MG 24 hr tablet, Take 300 mg by mouth daily., Disp: , Rfl:  .  carvedilol (COREG) 3.125 MG tablet, Take 1 tablet (3.125 mg total) by mouth 2 (two) times daily with a meal., Disp: 60 tablet, Rfl: 6 .  clonazePAM (KLONOPIN) 0.5 MG tablet, Take 0.5 mg by mouth 2 (two) times daily as needed for anxiety., Disp: , Rfl:  .  colchicine 0.6 MG tablet, Take 0.6 mg by mouth 2 (two) times daily as needed (gout)., Disp: , Rfl:  .  dapagliflozin propanediol (FARXIGA) 10 MG TABS tablet, Take 1 tablet (10 mg total) by mouth daily., Disp: 30 tablet, Rfl: 6 .  digoxin (LANOXIN) 0.125 MG tablet, Take 1 tablet (0.125 mg total) by mouth daily., Disp: 30 tablet, Rfl: 6 .  fenofibrate (TRICOR) 145 MG tablet, Take 145 mg by mouth daily., Disp: , Rfl:  .  furosemide (LASIX) 20 MG tablet, Take 1 tablet (20 mg total) by mouth as needed for fluid or edema., Disp: 30 tablet, Rfl:  11 .  potassium chloride SA (KLOR-CON) 20 MEQ tablet, Take 1 tablet (20 mEq total) by mouth as needed (with Lasix)., Disp: 90 tablet, Rfl: 3 .  sacubitril-valsartan (ENTRESTO) 97-103 MG, Take 1 tablet by mouth 2 (two) times daily., Disp: 60 tablet, Rfl: 6 .  spironolactone (ALDACTONE) 25 MG tablet, Take 1 tablet (25 mg total) by mouth daily., Disp: 30 tablet, Rfl: 6 .  traMADol (ULTRAM) 50 MG tablet, Take 50 mg by mouth 3 (three) times daily as needed for moderate pain., Disp: , Rfl:  .  XARELTO 20 MG TABS tablet, Take 1 tablet (20 mg total) by mouth daily., Disp: 30 tablet, Rfl: 3  Past Medical History: Past Medical History:  Diagnosis Date  . Arrhythmia   . Atrial fibrillation ([place])   . CHF (congestive heart failure) ([place])   . Gout   . Hyperlipidemia   . Hypertension     Tobacco Use: Social History   Tobacco Use  Smoking Status Never Smoker  Smokeless Tobacco Never Used    Labs: Recent Review Flowsheet Data    Labs for ITP Cardiac and Pulmonary Rehab Latest Ref Rng & Units 05/16/2020 05/16/2020 05/16/2020 05/17/2020 05/18/2020   Hemoglobin A1c 4.8 - 5.6 % - - - 6.0(H) -   O2SAT % 72.0 47.0 58.6 55.2 59.6      Capillary Blood Glucose: No results found for: GLUCAP   Exercise  Target Goals: Exercise Program Goal: Individual exercise prescription set using results from initial 6 min walk test and THRR while considering  patient's activity barriers and safety.   Exercise Prescription Goal: Starting with aerobic activity 30 plus minutes a day, 3 days per week for initial exercise prescription. Provide home exercise prescription and guidelines that participant acknowledges understanding prior to discharge.  Activity Barriers & Risk Stratification:  Activity Barriers & Cardiac Risk Stratification - 07/16/20 1611      Activity Barriers & Cardiac Risk Stratification   Activity Barriers Arthritis;Joint Problems   Arthritis: bilateral ankles, low back, fingers. Joint problems: left  shoulder weakness.   Cardiac Risk Stratification High   Below 5 MET's on walk test          6 Minute Walk:  6 Minute Walk    Row Name 07/16/20 1607         6 Minute Walk   Phase Initial     Distance 1694 feet     Walk Time 6 minutes     # of Rest Breaks 0     MPH 3.21     METS 4.29     RPE 11     Perceived Dyspnea  0     VO2 Peak 15.03     Symptoms No     Resting HR 65 bpm     Resting BP 124/94     Resting Oxygen Saturation  96 %     Exercise Oxygen Saturation  during 6 min walk 97 %     Max Ex. HR 86 bpm     Max Ex. BP 142/94     2 Minute Post BP 138/94            Oxygen Initial Assessment:   Oxygen Re-Evaluation:   Oxygen Discharge (Final Oxygen Re-Evaluation):   Initial Exercise Prescription:  Initial Exercise Prescription - 07/16/20 1600      Date of Initial Exercise RX and Referring Provider   Date 07/16/20    Referring Provider Glori Bickers MD    Expected Discharge Date 09/11/20      Recumbant Bike   Level 2    RPM 60    Minutes 15    METs 2.5      Arm Ergometer   Level 1.5    RPM 60    Minutes 15    METs 2      Prescription Details   Frequency (times per week) 3    Duration Progress to 30 minutes of continuous aerobic without signs/symptoms of physical distress      Intensity   THRR 40-80% of Max Heartrate 71-142    Ratings of Perceived Exertion 11-13    Perceived Dyspnea 0-4      Progression   Progression Continue progressive overload as per policy without signs/symptoms or physical distress.      Resistance Training   Training Prescription Yes    Weight 5    Reps 10-15           Perform Capillary Blood Glucose checks as needed.  Exercise Prescription Changes:   Exercise Comments:   Exercise Goals and Review:   Exercise Goals    Row Name 07/16/20 1624             Exercise Goals   Increase Physical Activity Yes       Intervention Provide advice, education, support and counseling about physical  activity/exercise needs.;Develop an individualized exercise prescription for aerobic and resistive training based  on initial evaluation findings, risk stratification, comorbidities and participant's personal goals.       Expected Outcomes Short Term: Attend rehab on a regular basis to increase amount of physical activity.;Long Term: Add in home exercise to make exercise part of routine and to increase amount of physical activity.;Long Term: Exercising regularly at least 3-5 days a week.       Increase Strength and Stamina Yes       Intervention Provide advice, education, support and counseling about physical activity/exercise needs.;Develop an individualized exercise prescription for aerobic and resistive training based on initial evaluation findings, risk stratification, comorbidities and participant's personal goals.       Expected Outcomes Short Term: Increase workloads from initial exercise prescription for resistance, speed, and METs.;Short Term: Perform resistance training exercises routinely during rehab and add in resistance training at home;Long Term: Improve cardiorespiratory fitness, muscular endurance and strength as measured by increased METs and functional capacity (6MWT)       Able to understand and use rate of perceived exertion (RPE) scale Yes       Intervention Provide education and explanation on how to use RPE scale       Expected Outcomes Short Term: Able to use RPE daily in rehab to express subjective intensity level;Long Term:  Able to use RPE to guide intensity level when exercising independently       Knowledge and understanding of Target Heart Rate Range (THRR) Yes       Intervention Provide education and explanation of THRR including how the numbers were predicted and where they are located for reference       Expected Outcomes Short Term: Able to state/look up THRR;Long Term: Able to use THRR to govern intensity when exercising independently;Short Term: Able to use daily as  guideline for intensity in rehab       Understanding of Exercise Prescription Yes       Intervention Provide education, explanation, and written materials on patient's individual exercise prescription       Expected Outcomes Short Term: Able to explain program exercise prescription;Long Term: Able to explain home exercise prescription to exercise independently              Exercise Goals Re-Evaluation :    Discharge Exercise Prescription (Final Exercise Prescription Changes):   Nutrition:  Target Goals: Understanding of nutrition guidelines, daily intake of sodium <1547m, cholesterol <2051m calories 30% from fat and 7% or less from saturated fats, daily to have 5 or more servings of fruits and vegetables.  Biometrics:  Pre Biometrics - 07/16/20 1605      Pre Biometrics   Waist Circumference 52.25 inches    Hip Circumference 48 inches    Waist to Hip Ratio 1.09 %    Triceps Skinfold 24 mm    % Body Fat 38.2 %    Grip Strength 58 kg    Flexibility 2 in    Single Leg Stand 15 seconds            Nutrition Therapy Plan and Nutrition Goals:   Nutrition Assessments:  MEDIFICTS Score Key:  ?70 Need to make dietary changes   40-70 Heart Healthy Diet  ? 40 Therapeutic Level Cholesterol Diet   Picture Your Plate Scores:  <4<71nhealthy dietary pattern with much room for improvement.  41-50 Dietary pattern unlikely to meet recommendations for good health and room for improvement.  51-60 More healthful dietary pattern, with some room for improvement.   >60 Healthy dietary pattern, although  there may be some specific behaviors that could be improved.    Nutrition Goals Re-Evaluation:   Nutrition Goals Discharge (Final Nutrition Goals Re-Evaluation):   Psychosocial: Target Goals: Acknowledge presence or absence of significant depression and/or stress, maximize coping skills, provide positive support system. Participant is able to verbalize types and ability to  use techniques and skills needed for reducing stress and depression.  Initial Review & Psychosocial Screening:  Initial Psych Review & Screening - 07/17/20 0837      Initial Review   Current issues with History of Depression;Current Anxiety/Panic      Family Dynamics   Good Support System? Yes   Alexander [name] is divorced. Alexander [name] has his ex wife and friends for support   Comments Alexander [name] has anxiety adjustment disorder      Barriers   Psychosocial barriers to participate in program The patient should benefit from training in stress management and relaxation.      Screening Interventions   Interventions To provide support and resources with identified psychosocial needs;Encouraged to exercise;Provide feedback about the scores to participant    Expected Outcomes Long Term Goal: Stressors or current issues are controlled or eliminated.;Short Term goal: Identification and review with participant of any Quality of Life or Depression concerns found by scoring the questionnaire.           Quality of Life Scores:  Quality of Life - 07/16/20 1628      Quality of Life   Select Quality of Life      Quality of Life Scores   Health/Function Pre 22.77 %    Socioeconomic Pre 27.43 %    Psych/Spiritual Pre 19.17 %    Family Pre 24.3 %    GLOBAL Pre 23.33 %          Scores of 19 and below usually indicate a poorer quality of life in these areas.  A difference of  2-3 points is a clinically meaningful difference.  A difference of 2-3 points in the total score of the Quality of Life Index has been associated with significant improvement in overall quality of life, self-image, physical symptoms, and general health in studies assessing change in quality of life.  PHQ-9: Recent Review Flowsheet Data    Depression screen [facility] 2/9 07/17/2020 02/24/2020   Decreased Interest 0 0   Down, Depressed, Hopeless 0 0   PHQ - 2 Score 0 0     Interpretation of Total Score  Total Score Depression Severity:  1-4  = Minimal depression, 5-9 = Mild depression, 10-14 = Moderate depression, 15-19 = Moderately severe depression, 20-27 = Severe depression   Psychosocial Evaluation and Intervention:   Psychosocial Re-Evaluation:   Psychosocial Discharge (Final Psychosocial Re-Evaluation):   Vocational Rehabilitation: Provide vocational rehab assistance to qualifying candidates.   Vocational Rehab Evaluation & Intervention:  Vocational Rehab - 07/17/20 0841      Initial Vocational Rehab Evaluation & Intervention   Assessment shows need for Vocational Rehabilitation No   Alexander [name] is working full time and does not need vocational rehab at this time          Education: Education Goals: Education classes will be provided on a weekly basis, covering required topics. Participant will state understanding/return demonstration of topics presented.  Learning Barriers/Preferences:  Learning Barriers/Preferences - 07/16/20 1634      Learning Barriers/Preferences   Learning Barriers None    Learning Preferences Individual Instruction;Computer/Internet           Education Topics: Hypertension, Hypertension Reduction -Define  heart disease and high blood pressure. Discus how high blood pressure affects the body and ways to reduce high blood pressure.   Exercise and Your Heart -Discuss why it is important to exercise, the FITT principles of exercise, normal and abnormal responses to exercise, and how to exercise safely.   Angina -Discuss definition of angina, causes of angina, treatment of angina, and how to decrease risk of having angina.   Cardiac Medications -Review what the following cardiac medications are used for, how they affect the body, and side effects that may occur when taking the medications.  Medications include Aspirin, Beta blockers, calcium channel blockers, ACE Inhibitors, angiotensin receptor blockers, diuretics, digoxin, and antihyperlipidemics.   Congestive Heart  Failure -Discuss the definition of CHF, how to live with CHF, the signs and symptoms of CHF, and how keep track of weight and sodium intake.   Heart Disease and Intimacy -Discus the effect sexual activity has on the heart, how changes occur during intimacy as we age, and safety during sexual activity.   Smoking Cessation / COPD -Discuss different methods to quit smoking, the health benefits of quitting smoking, and the definition of COPD.   Nutrition I: Fats -Discuss the types of cholesterol, what cholesterol does to the heart, and how cholesterol levels can be controlled.   Nutrition II: Labels -Discuss the different components of food labels and how to read food label   Heart Parts/Heart Disease and PAD -Discuss the anatomy of the heart, the pathway of blood circulation through the heart, and these are affected by heart disease.   Stress I: Signs and Symptoms -Discuss the causes of stress, how stress may lead to anxiety and depression, and ways to limit stress.   Stress II: Relaxation -Discuss different types of relaxation techniques to limit stress.   Warning Signs of Stroke / TIA -Discuss definition of a stroke, what the signs and symptoms are of a stroke, and how to identify when someone is having stroke.   Knowledge Questionnaire Score:  Knowledge Questionnaire Score - 07/16/20 1633      Knowledge Questionnaire Score   Pre Score 21/24           Core Components/Risk Factors/Patient Goals at Admission:  Personal Goals and Risk Factors at Admission - 07/16/20 1635      Core Components/Risk Factors/Patient Goals on Admission    Weight Management Yes;Weight Loss    Intervention Weight Management: Develop a combined nutrition and exercise program designed to reach desired caloric intake, while maintaining appropriate intake of nutrient and fiber, sodium and fats, and appropriate energy expenditure required for the weight goal.;Weight Management: Provide education  and appropriate resources to help participant work on and attain dietary goals.;Weight Management/Obesity: Establish reasonable short term and long term weight goals.;Obesity: Provide education and appropriate resources to help participant work on and attain dietary goals.    Heart Failure Yes    Intervention Provide a combined exercise and nutrition program that is supplemented with education, support and counseling about heart failure. Directed toward relieving symptoms such as shortness of breath, decreased exercise tolerance, and extremity edema.    Expected Outcomes Improve functional capacity of life;Short term: Attendance in program 2-3 days a week with increased exercise capacity. Reported lower sodium intake. Reported increased fruit and vegetable intake. Reports medication compliance.;Short term: Daily weights obtained and reported for increase. Utilizing diuretic protocols set by physician.;Long term: Adoption of self-care skills and reduction of barriers for early signs and symptoms recognition and intervention leading to self-care maintenance.  Hypertension Yes    Intervention Provide education on lifestyle modifcations including regular physical activity/exercise, weight management, moderate sodium restriction and increased consumption of fresh fruit, vegetables, and low fat dairy, alcohol moderation, and smoking cessation.;Monitor prescription use compliance.    Expected Outcomes Short Term: Continued assessment and intervention until BP is < 140/80m HG in hypertensive participants. < 130/844mHG in hypertensive participants with diabetes, heart failure or chronic kidney disease.;Long Term: Maintenance of blood pressure at goal levels.    Lipids Yes    Intervention Provide education and support for participant on nutrition & aerobic/resistive exercise along with prescribed medications to achieve LDL <7069mHDL >60m11m  Expected Outcomes Short Term: Participant states understanding of  desired cholesterol values and is compliant with medications prescribed. Participant is following exercise prescription and nutrition guidelines.;Long Term: Cholesterol controlled with medications as prescribed, with individualized exercise RX and with personalized nutrition plan. Value goals: LDL < 70mg66mL > 40 mg.    Stress Yes    Intervention Offer individual and/or small group education and counseling on adjustment to heart disease, stress management and health-related lifestyle change. Teach and support self-help strategies.;Refer participants experiencing significant psychosocial distress to appropriate mental health specialists for further evaluation and treatment. When possible, include family members and significant others in education/counseling sessions.    Expected Outcomes Short Term: Participant demonstrates changes in health-related behavior, relaxation and other stress management skills, ability to obtain effective social support, and compliance with psychotropic medications if prescribed.;Long Term: Emotional wellbeing is indicated by absence of clinically significant psychosocial distress or social isolation.    Personal Goal Other Yes    Personal Goal Pt wants to form sustaining healthy habits that he can maintain through life. Pt wants to lose 60 lbs over time    Intervention Will work with pt to create resonable steps to help achive his goals.    Expected Outcomes Will help pt assess goals through education and monitoring.           Core Components/Risk Factors/Patient Goals Review:    Core Components/Risk Factors/Patient Goals at Discharge (Final Review):    ITP Comments:  ITP Comments    Row Name 07/16/20 1433           ITP Comments Dr TraciFransico HimMedical Director              Comments: JeremYsidro Evertnded orientation on 07/17/2020 to review rules and guidelines for program.  Completed 6 minute walk test, Intitial ITP, and exercise prescription.  Diastolic BP  in the 90's 99'Itolic BP [id number]-Sinus Rhythm.  Asymptomatic. Safety measures and social distancing in place per CDC guidelines.MariaBarnet PallBSN 07/17/2020 8:44 AM

## 2020-07-17 NOTE — Progress Notes (Signed)
Walk test vital signs faxed to heart failure clinic for review.Gladstone Lighter, RN,BSN 07/17/2020 3:45 PM

## 2020-07-20 ENCOUNTER — Encounter (HOSPITAL_COMMUNITY)
Admission: RE | Admit: 2020-07-20 | Discharge: 2020-07-20 | Disposition: A | Discharge status: Home / Self Care | Payer: BC Managed Care – PPO | Source: Ambulatory Visit | Attending: Internal Medicine | Admitting: Internal Medicine

## 2020-07-20 ENCOUNTER — Other Ambulatory Visit: Payer: Self-pay

## 2020-07-20 DIAGNOSIS — I5042 Chronic combined systolic (congestive) and diastolic (congestive) heart failure: Secondary | ICD-10-CM | POA: Diagnosis present

## 2020-07-20 DIAGNOSIS — I5041 Acute combined systolic (congestive) and diastolic (congestive) heart failure: Secondary | ICD-10-CM

## 2020-07-20 NOTE — Progress Notes (Signed)
Daily Session Note  Patient Details  Name: Alexander [name] MRN: [id number] Date of Birth: [birth year] Referring Provider:   Flowsheet Row CARDIAC REHAB PHASE II ORIENTATION from 07/16/2020 in [place]  Referring Provider Alexander Bickers MD      Encounter Date: 07/20/2020  Check In:  Session Check In - 07/20/20 1222      Check-In   Supervising physician immediately available to respond to emergencies [facility] immediately available    Physician(s) Dr. Arbutus Ped    Location [facility]    Staff Present Lesly Rubenstein, MS, EP-C, CCRP;Olinty Celesta Aver, MS, ACSM CEP, Exercise Physiologist;Wilmary Levit, RN, BSN;Other;Carlette Wilber Oliphant, RN, BSN    Virtual Visit No    Medication changes reported     No    Fall or balance concerns reported    No    Tobacco Cessation No Change    Warm-up and Cool-down Performed on first and last piece of equipment    Resistance Training Performed Yes    VAD Patient? No    PAD/SET Patient? No      Pain Assessment   Currently in Pain? No/denies    Pain Score 0-No pain    Multiple Pain Sites No           Capillary Blood Glucose: No results found for this or any previous visit (from the past 24 hour(s)).   Exercise Prescription Changes - 07/20/20 1127      Response to Exercise   Blood Pressure (Admit) 130/90    Blood Pressure (Exercise) 128/90    Blood Pressure (Exit) 122/88    Heart Rate (Admit) 81 bpm    Heart Rate (Exercise) 87 bpm    Heart Rate (Exit) 74 bpm    Rating of Perceived Exertion (Exercise) 12    Symptoms none    Comments Off to a great start with exercise.    Duration Continue with 30 min of aerobic exercise without signs/symptoms of physical distress.    Intensity THRR unchanged      Progression   Progression Continue to progress workloads to maintain intensity without signs/symptoms of physical distress.    Average METs 2.8      Resistance Training   Training  Prescription Yes    Weight 5    Reps 10-15    Time 10 Minutes      Interval Training   Interval Training No      Recumbant Bike   Level 3    Minutes 15    METs 2.8      Arm Ergometer   Level 3    Watts 7    Minutes 15      Home Exercise Plan   Plans to continue exercise at [employer] (comment)    Frequency Add 4 additional days to program exercise sessions.           Social History   Tobacco Use  Smoking Status Never Smoker  Smokeless Tobacco Never Used    Goals Met:  Exercise tolerated well No report of cardiac concerns or symptoms Strength training completed today  Goals Unmet:  Not Applicable  Comments: [name] started cardiac rehab today.  Pt tolerated light exercise without difficulty. VSS, telemetry-Sinus Rhythm, asymptomatic.  Medication list reconciled. Pt denies barriers to medicaiton compliance.  PSYCHOSOCIAL ASSESSMENT:  PHQ-0. Pt exhibits positive coping skills, hopeful outlook with supportive family. No psychosocial needs identified at this time, no psychosocial interventions necessary.    Pt enjoys golf, housework and  traveling.   Pt oriented to exercise equipment and routine.    Understanding verbalized.Alexander Pall, RN,BSN 07/20/2020 3:58 PM   Dr. Fransico Him is Medical Director for Cardiac Rehab at [facility].

## 2020-07-22 ENCOUNTER — Other Ambulatory Visit: Payer: Self-pay

## 2020-07-22 ENCOUNTER — Encounter (HOSPITAL_COMMUNITY)
Admission: RE | Admit: 2020-07-22 | Discharge: 2020-07-22 | Disposition: A | Discharge status: Home / Self Care | Payer: BC Managed Care – PPO | Source: Ambulatory Visit | Attending: Internal Medicine | Admitting: Internal Medicine

## 2020-07-22 DIAGNOSIS — I5041 Acute combined systolic (congestive) and diastolic (congestive) heart failure: Secondary | ICD-10-CM

## 2020-07-22 DIAGNOSIS — I5042 Chronic combined systolic (congestive) and diastolic (congestive) heart failure: Secondary | ICD-10-CM | POA: Diagnosis not present

## 2020-07-24 ENCOUNTER — Other Ambulatory Visit: Payer: Self-pay

## 2020-07-24 ENCOUNTER — Encounter (HOSPITAL_COMMUNITY)
Admission: RE | Admit: 2020-07-24 | Discharge: 2020-07-24 | Disposition: A | Discharge status: Home / Self Care | Payer: BC Managed Care – PPO | Source: Ambulatory Visit | Attending: Internal Medicine | Admitting: Internal Medicine

## 2020-07-24 DIAGNOSIS — I5042 Chronic combined systolic (congestive) and diastolic (congestive) heart failure: Secondary | ICD-10-CM | POA: Diagnosis not present

## 2020-07-24 DIAGNOSIS — I5041 Acute combined systolic (congestive) and diastolic (congestive) heart failure: Secondary | ICD-10-CM

## 2020-07-27 ENCOUNTER — Other Ambulatory Visit: Payer: Self-pay

## 2020-07-27 ENCOUNTER — Encounter (HOSPITAL_COMMUNITY)
Admission: RE | Admit: 2020-07-27 | Discharge: 2020-07-27 | Disposition: A | Discharge status: Home / Self Care | Payer: BC Managed Care – PPO | Source: Ambulatory Visit | Attending: Internal Medicine | Admitting: Internal Medicine

## 2020-07-27 DIAGNOSIS — I5042 Chronic combined systolic (congestive) and diastolic (congestive) heart failure: Secondary | ICD-10-CM

## 2020-07-27 DIAGNOSIS — I5041 Acute combined systolic (congestive) and diastolic (congestive) heart failure: Secondary | ICD-10-CM

## 2020-07-29 ENCOUNTER — Encounter (HOSPITAL_COMMUNITY)
Admission: RE | Admit: 2020-07-29 | Discharge: 2020-07-29 | Disposition: A | Discharge status: Home / Self Care | Payer: BC Managed Care – PPO | Source: Ambulatory Visit | Attending: Internal Medicine | Admitting: Internal Medicine

## 2020-07-29 ENCOUNTER — Other Ambulatory Visit: Payer: Self-pay

## 2020-07-29 DIAGNOSIS — I5041 Acute combined systolic (congestive) and diastolic (congestive) heart failure: Secondary | ICD-10-CM

## 2020-07-29 DIAGNOSIS — I5042 Chronic combined systolic (congestive) and diastolic (congestive) heart failure: Secondary | ICD-10-CM | POA: Diagnosis not present

## 2020-07-29 NOTE — Progress Notes (Signed)
QUALITY OF LIFE SCORE REVIEW  Alexander [name] completed Quality of Life survey as a participant in Cardiac Rehab. Scores 21.0 or below are considered low. Pt score very low in several areas Overall 23.3, Health and Function 22.77, socioeconomic 27.43, physiological and spiritual 19.17, family 24.30. Patient quality of life slightly altered by physical constraints which limits ability to perform as prior to recent cardiac illness. Alexander [name] reports feeling dissatisfied with his health due to his recent hospitalization and weight. Alexander [name] denies being depressed as he is currently taking an antidepressant.  Offered emotional support and reassurance.  Will continue to monitor and intervene as necessary.  Alexander [name] is enjoying participating in the program and hopes to feel better as he progresses in the program.Safiyah Cisney Harlon Flor, RN,BSN 07/29/2020 12:28 PM

## 2020-07-29 NOTE — Progress Notes (Signed)
Reviewed home exercise guidelines with patient including endpoints, temperature precautions, target heart rate and rate of perceived exertion. Patient goes to the gym 3-4 days/week and exercises 30 minutes on the treadmill as his mode of home exercise. Patient has 25 lb weights at home that he states he's using for his resistance training exercises. Patient voices understanding of instructions given.  Artist Pais, MS, ACSM CEP

## 2020-07-31 ENCOUNTER — Encounter (HOSPITAL_COMMUNITY)
Admission: RE | Admit: 2020-07-31 | Discharge: 2020-07-31 | Disposition: A | Discharge status: Home / Self Care | Payer: BC Managed Care – PPO | Source: Ambulatory Visit | Attending: Internal Medicine | Admitting: Internal Medicine

## 2020-07-31 ENCOUNTER — Other Ambulatory Visit: Payer: Self-pay

## 2020-07-31 VITALS — Wt 288.0 lb

## 2020-07-31 DIAGNOSIS — I5042 Chronic combined systolic (congestive) and diastolic (congestive) heart failure: Secondary | ICD-10-CM | POA: Diagnosis not present

## 2020-07-31 DIAGNOSIS — I5041 Acute combined systolic (congestive) and diastolic (congestive) heart failure: Secondary | ICD-10-CM

## 2020-07-31 NOTE — Progress Notes (Signed)
Alexander [name] 45 y.o. male Nutrition Note  Diagnosis: CHF  Past Medical History:  Diagnosis Date  . Arrhythmia   . Atrial fibrillation (HCC)   . CHF (congestive heart failure) (HCC)   . Gout   . Hyperlipidemia   . Hypertension      Medications reviewed.   Current Outpatient Medications:  .  acetaminophen (TYLENOL) 500 MG tablet, Take 500 mg by mouth every 6 (six) hours as needed for moderate pain., Disp: , Rfl:  .  allopurinol (ZYLOPRIM) 100 MG tablet, Take 100 mg by mouth daily., Disp: , Rfl:  .  amiodarone (PACERONE) 200 MG tablet, Take 200 mg by mouth daily., Disp: , Rfl:  .  bismuth subsalicylate (PEPTO BISMOL) 262 MG chewable tablet, Chew 524 mg by mouth daily as needed for indigestion., Disp: , Rfl:  .  buPROPion (WELLBUTRIN XL) 300 MG 24 hr tablet, Take 300 mg by mouth daily., Disp: , Rfl:  .  carvedilol (COREG) 3.125 MG tablet, Take 1 tablet (3.125 mg total) by mouth 2 (two) times daily with a meal., Disp: 60 tablet, Rfl: 6 .  clonazePAM (KLONOPIN) 0.5 MG tablet, Take 0.5 mg by mouth 2 (two) times daily as needed for anxiety., Disp: , Rfl:  .  colchicine 0.6 MG tablet, Take 0.6 mg by mouth 2 (two) times daily as needed (gout)., Disp: , Rfl:  .  dapagliflozin propanediol (FARXIGA) 10 MG TABS tablet, Take 1 tablet (10 mg total) by mouth daily., Disp: 30 tablet, Rfl: 6 .  digoxin (LANOXIN) 0.125 MG tablet, Take 1 tablet (0.125 mg total) by mouth daily., Disp: 30 tablet, Rfl: 6 .  fenofibrate (TRICOR) 145 MG tablet, Take 145 mg by mouth daily., Disp: , Rfl:  .  furosemide (LASIX) 20 MG tablet, Take 1 tablet (20 mg total) by mouth as needed for fluid or edema., Disp: 30 tablet, Rfl: 11 .  potassium chloride SA (KLOR-CON) 20 MEQ tablet, Take 1 tablet (20 mEq total) by mouth as needed (with Lasix)., Disp: 90 tablet, Rfl: 3 .  sacubitril-valsartan (ENTRESTO) 97-103 MG, Take 1 tablet by mouth 2 (two) times daily., Disp: 60 tablet, Rfl: 6 .  spironolactone (ALDACTONE) 25 MG tablet,  Take 1 tablet (25 mg total) by mouth daily., Disp: 30 tablet, Rfl: 6 .  traMADol (ULTRAM) 50 MG tablet, Take 50 mg by mouth 3 (three) times daily as needed for moderate pain., Disp: , Rfl:  .  XARELTO 20 MG TABS tablet, Take 1 tablet (20 mg total) by mouth daily., Disp: 30 tablet, Rfl: 3   Ht Readings from Last 1 Encounters:  07/16/20 6' (1.829 m)     Wt Readings from Last 3 Encounters:  07/16/20 288 lb 12.8 oz (131 kg)  07/03/20 278 lb 12.8 oz (126.5 kg)  06/08/20 288 lb (130.6 kg)     There is no height or weight on file to calculate BMI.   Social History   Tobacco Use  Smoking Status Never Smoker  Smokeless Tobacco Never Used     No results found for: CHOL No results found for: HDL No results found for: LDLCALC No results found for: TRIG   Lab Results  Component Value Date   HGBA1C 6.0 (H) 05/17/2020     CBG (last 3)  No results for input(s): GLUCAP in the last 72 hours.   Nutrition Note  Spoke with pt. Nutrition Plan and Nutrition Survey goals reviewed with pt. Pt is following a Heart Healthy diet. Pt wants to lose wt. Pt  has been trying to lose wt by tracking intake on myfitnesspal and moderate intensity exercise. Wt loss tips reviewed (adequate calorie and protein intake, label reading, how to build a healthy plate, portion sizes, eating frequently across the day). Reviewed calorie goals for 1-2 lb weight loss.  Pt has Pre-diabetes. Last A1c indicates blood glucose well-controlled. Most recent fasting glucose was 94 mg/dl.  Pt with dx of CHF. Per discussion, pt does not use canned/convenience foods often. Pt does not add salt to food. Pt does not eat out frequently. He has drastically decreased sodium intake since he was diagnosed with CHF in January. He is trying to stay under 2000 mg sodium per day.  He drinks water and occasionally diet soda.  Pt expressed understanding of the information reviewed.    Nutrition Diagnosis ? Food-and nutrition-related  knowledge deficit related to lack of exposure to information as related to diagnosis of: ? CVD ? Prediabetes ?  Obese  II = 35-39.9 related to excessive energy intake as evidenced by a BMI 39.06 kg/m2  Nutrition Intervention ? Pt's individual nutrition plan reviewed with pt. ? Benefits of adopting Heart Healthy diet discussed when Picture Your Plate reviewed. ? Continue client-centered nutrition education by RD, as part of interdisciplinary care.  Goal(s) ? Pt to identify and limit food sources of saturated fat, trans fat, refined carbohydrates and sodium ? Pt to identify food quantities necessary to achieve weight loss of 6-24 lb at graduation from cardiac rehab.   Plan:   Will provide client-centered nutrition education as part of interdisciplinary care  Monitor and evaluate progress toward nutrition goal with team.   Andrey Campanile, MS, RDN, LDN

## 2020-08-02 NOTE — H&P (View-Only) (Signed)
 Advanced Heart Failure Clinic Note   PCP: Cartwright, Sarah, MD [facility]: Dr. Florencio Hollibaugh  EP: Dr. Lambert   Reason for Visit: [facility] Hospital F/u for Systolic Heart Failure and Atrial Fibrillation   HPI:  Alexander [name] is a 45 y.o. year old with systolic heart failure (presumed tachy-mediated), obesity, HTN, hyperlipidemia, adjustment disorder, anxiety, and A fib.  Presented to [facility] on 04/24/20 with palpitations and SOB in setting of heavy ETOH use EKG showed A Fib RVR. Started on diltiazem with plans for admit but he left AMA. Discharged on diltiazem + Xarelto.   Presented to [facility] ED on 12/18/21with HF symptoms. EKG showed A Fib RVR. Admitted and started on diltiazem. Echo showed EF 35-40%.EPconsulted. Started on metoprolol and losartan with plans to reassess in 3 weeks for possible cardioversion.   Sent back to [facility] ED from EP clinic on 05/13/20 with N/V and A fib RVR. On arrival he was cool and volume overloaed.  Started on amio drip + milrinone due to cardiogenic shock. Echo EF 10-15%. Diuresed with IV lasix and later stopped with addition of GDMT. After amio load had successful cardioversion. Discharged home on po amio with taper. All HF meds provided through TOC.    Here for f/u. Feels great. No SOB, CP, orthopnea or PND. Just started CR. Has sleep study in May. SBP s 120-140 Diastolic BPs running around 90s. Takes lasix on occasion. No longer drinking.   Echo today 08/03/20 EF 45-50% RV ok. Personally reviewed   ROS: All systems reviewed and negative except as per HPI.   Past Medical History:  Diagnosis Date  . Arrhythmia   . Atrial fibrillation (HCC)   . CHF (congestive heart failure) (HCC)   . Gout   . Hyperlipidemia   . Hypertension     Current Outpatient Medications  Medication Sig Dispense Refill  . acetaminophen (TYLENOL) 500 MG tablet Take 500 mg by mouth every 6 (six) hours as needed for moderate pain.    . allopurinol (ZYLOPRIM) 100 MG tablet Take 100 mg by  mouth daily.    . amiodarone (PACERONE) 200 MG tablet Take 200 mg by mouth daily.    . bismuth subsalicylate (PEPTO BISMOL) 262 MG chewable tablet Chew 524 mg by mouth daily as needed for indigestion.    . buPROPion (WELLBUTRIN XL) 300 MG 24 hr tablet Take 300 mg by mouth daily.    . clonazePAM (KLONOPIN) 0.5 MG tablet Take 0.5 mg by mouth 2 (two) times daily as needed for anxiety.    . colchicine 0.6 MG tablet Take 0.6 mg by mouth 2 (two) times daily as needed (gout).    . dapagliflozin propanediol (FARXIGA) 10 MG TABS tablet Take 1 tablet (10 mg total) by mouth daily. 30 tablet 6  . fenofibrate (TRICOR) 145 MG tablet Take 145 mg by mouth daily.    . furosemide (LASIX) 20 MG tablet Take 1 tablet (20 mg total) by mouth as needed for fluid or edema. 30 tablet 11  . potassium chloride SA (KLOR-CON) 20 MEQ tablet Take 1 tablet (20 mEq total) by mouth as needed (with Lasix). 90 tablet 3  . sacubitril-valsartan (ENTRESTO) 97-103 MG Take 1 tablet by mouth 2 (two) times daily. 60 tablet 6  . spironolactone (ALDACTONE) 25 MG tablet Take 1 tablet (25 mg total) by mouth daily. 30 tablet 6  . traMADol (ULTRAM) 50 MG tablet Take 50 mg by mouth 3 (three) times daily as needed for moderate pain.    .   XARELTO 20 MG TABS tablet Take 1 tablet (20 mg total) by mouth daily. 30 tablet 3  . carvedilol (COREG) 6.25 MG tablet Take 1 tablet (6.25 mg total) by mouth 2 (two) times daily with a meal. 60 tablet 3   No current facility-administered medications for this encounter.    Allergies  Allergen Reactions  . Albuterol Anaphylaxis  . Bee Venom Anaphylaxis  . Metaproterenol Anaphylaxis  . Other Anaphylaxis    DEET  . Amoxicillin-Pot Clavulanate Itching    OK with Amoxicillin  . Cephalexin Other (See Comments)    Chest tightness  . Amoxicillin Itching and Rash    Itching all over       Social History   Socioeconomic History  . Marital status: Divorced    Spouse name: Not on file  . Number of children:  2  . Years of education: 16  . Highest education level: Bachelor's degree (e.g., BA, AB, BS)  Occupational History  . Not on file  Tobacco Use  . Smoking status: Never Smoker  . Smokeless tobacco: Never Used  Substance and Sexual Activity  . Alcohol use: Not Currently    Comment: stopped drinking in December 2021  . Drug use: Not Currently  . Sexual activity: Not on file  Other Topics Concern  . Not on file  Social History Narrative  . Not on file   Social Determinants of Health   Financial Resource Strain: Not on file  Food Insecurity: Not on file  Transportation Needs: Not on file  Physical Activity: Not on file  Stress: Not on file  Social Connections: Not on file  Intimate Partner Violence: Not on file   Family History  Problem Relation Age of Onset  . COPD Mother    Vitals:   08/03/20 1208  BP: 140/88  Pulse: 73  SpO2: 96%  Weight: 132.5 kg (292 lb)   Wt Readings from Last 3 Encounters:  08/03/20 132.5 kg (292 lb)  07/31/20 130.6 kg (288 lb)  07/16/20 131 kg (288 lb 12.8 oz)   PHYSICAL EXAM: General:  Well appearing. No resp difficulty HEENT: normal Neck: supple. no JVD. Carotids 2+ bilat; no bruits. No lymphadenopathy or thryomegaly appreciated. Cor: PMI nondisplaced. Regular rate & rhythm. No rubs, gallops or murmurs. Lungs: clear Abdomen: obese soft, nontender, nondistended. No hepatosplenomegaly. No bruits or masses. Good bowel sounds. Extremities: no cyanosis, clubbing, rash, edema Neuro: alert & orientedx3, cranial nerves grossly intact. moves all 4 extremities w/o difficulty. Affect pleasant   ASSESSMENT & PLAN:  1. 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 - Echo today 08/03/20 EF 45-50% RV ok. Personally reviewed - NYHA  Class I- II. Volume status good today on exam, weight down. - Continue lasix 20 mg prn.  - Continue Entresto 97-103 mg bid. - Continue Farxiga 10 mg daily.  - Continue spiro 25 mg daily.  - Increase carvedilol to 6.25 mg bid.  - Stop digoxin - Will plan R/L cath to exclude underlying CAD - Labs today  2. PAF - Maintaining NSR post DCCV. - Continue amiodarone 200 mg daily. If EF improves, may be able to stop. - Continue Xarelto. No bleeding issues. - Stressed need to avoid ETOH - Sleep study ordered.   3. Obesity   Body mass index is 39.6 kg/m. - He has met with a nutritionist and is working on weight loss  Alexander Jocson, MD 08/03/20 

## 2020-08-02 NOTE — Progress Notes (Signed)
Advanced Heart Failure Clinic Note   PCP: Molli Barrows, MD [facility]: Dr. Haroldine Laws  EP: Dr. Quentin Ore   Reason for Visit: [facility] F/u for Systolic Heart Failure and Atrial Fibrillation   HPI:  Mr Angert is a 45 y.o. year old with systolic heart failure (presumed tachy-mediated), obesity, HTN, hyperlipidemia, adjustment disorder, anxiety, and A fib.  Presented to [facility] on 04/24/20 with palpitations and SOB in setting of heavy ETOH use EKG showed A Fib RVR. Started on diltiazem with plans for admit but he left AMA. Discharged on diltiazem + Xarelto.   Presented to [facility] ED on 12/18/21with HF symptoms. EKG showed A Fib RVR. Admitted and started on diltiazem. Echo showed EF 35-40%.EPconsulted. Started on metoprolol and losartan with plans to reassess in 3 weeks for possible cardioversion.   Sent back to [facility] ED from EP clinic on 05/13/20 with N/V and A fib RVR. On arrival he was cool and volume overloaed.  Started on amio drip + milrinone due to cardiogenic shock. Echo EF 10-15%. Diuresed with IV lasix and later stopped with addition of GDMT. After amio load had successful cardioversion. Discharged home on po amio with taper. All HF meds provided through TOC.    Here for f/u. Feels great. No SOB, CP, orthopnea or PND. Just started CR. Has sleep study in May. SBP s 017-494 Diastolic BPs running around 90s. Takes lasix on occasion. No longer drinking.   Echo today 08/03/20 EF 45-50% RV ok. Personally reviewed   ROS: All systems reviewed and negative except as per HPI.   Past Medical History:  Diagnosis Date  . Arrhythmia   . Atrial fibrillation ([place])   . CHF (congestive heart failure) ([place])   . Gout   . Hyperlipidemia   . Hypertension     Current Outpatient Medications  Medication Sig Dispense Refill  . acetaminophen (TYLENOL) 500 MG tablet Take 500 mg by mouth every 6 (six) hours as needed for moderate pain.    Marland Kitchen allopurinol (ZYLOPRIM) 100 MG tablet Take 100 mg by  mouth daily.    Marland Kitchen amiodarone (PACERONE) 200 MG tablet Take 200 mg by mouth daily.    Marland Kitchen bismuth subsalicylate (PEPTO BISMOL) 262 MG chewable tablet Chew 524 mg by mouth daily as needed for indigestion.    Marland Kitchen buPROPion (WELLBUTRIN XL) 300 MG 24 hr tablet Take 300 mg by mouth daily.    . clonazePAM (KLONOPIN) 0.5 MG tablet Take 0.5 mg by mouth 2 (two) times daily as needed for anxiety.    . colchicine 0.6 MG tablet Take 0.6 mg by mouth 2 (two) times daily as needed (gout).    . dapagliflozin propanediol (FARXIGA) 10 MG TABS tablet Take 1 tablet (10 mg total) by mouth daily. 30 tablet 6  . fenofibrate (TRICOR) 145 MG tablet Take 145 mg by mouth daily.    . furosemide (LASIX) 20 MG tablet Take 1 tablet (20 mg total) by mouth as needed for fluid or edema. 30 tablet 11  . potassium chloride SA (KLOR-CON) 20 MEQ tablet Take 1 tablet (20 mEq total) by mouth as needed (with Lasix). 90 tablet 3  . sacubitril-valsartan (ENTRESTO) 97-103 MG Take 1 tablet by mouth 2 (two) times daily. 60 tablet 6  . spironolactone (ALDACTONE) 25 MG tablet Take 1 tablet (25 mg total) by mouth daily. 30 tablet 6  . traMADol (ULTRAM) 50 MG tablet Take 50 mg by mouth 3 (three) times daily as needed for moderate pain.    Marland Kitchen  XARELTO 20 MG TABS tablet Take 1 tablet (20 mg total) by mouth daily. 30 tablet 3  . carvedilol (COREG) 6.25 MG tablet Take 1 tablet (6.25 mg total) by mouth 2 (two) times daily with a meal. 60 tablet 3   No current facility-administered medications for this encounter.    Allergies  Allergen Reactions  . Albuterol Anaphylaxis  . Bee Venom Anaphylaxis  . Metaproterenol Anaphylaxis  . Other Anaphylaxis    DEET  . Amoxicillin-Pot Clavulanate Itching    OK with Amoxicillin  . Cephalexin Other (See Comments)    Chest tightness  . Amoxicillin Itching and Rash    Itching all over       Social History   Socioeconomic History  . Marital status: Divorced    Spouse name: Not on file  . Number of children:  2  . Years of education: 16  . Highest education level: Bachelor's degree (e.g., BA, AB, BS)  Occupational History  . Not on file  Tobacco Use  . Smoking status: Never Smoker  . Smokeless tobacco: Never Used  Substance and Sexual Activity  . Alcohol use: Not Currently    Comment: stopped drinking in December 2021  . Drug use: Not Currently  . Sexual activity: Not on file  Other Topics Concern  . Not on file  Social History Narrative  . Not on file   Social Determinants of Health   Financial Resource Strain: Not on file  Food Insecurity: Not on file  Transportation Needs: Not on file  Physical Activity: Not on file  Stress: Not on file  Social Connections: Not on file  Intimate Partner Violence: Not on file   Family History  Problem Relation Age of Onset  . COPD Mother    Vitals:   08/03/20 1208  BP: 140/88  Pulse: 73  SpO2: 96%  Weight: 132.5 kg (292 lb)   Wt Readings from Last 3 Encounters:  08/03/20 132.5 kg (292 lb)  07/31/20 130.6 kg (288 lb)  07/16/20 131 kg (288 lb 12.8 oz)   PHYSICAL EXAM: General:  Well appearing. No resp difficulty HEENT: normal Neck: supple. no JVD. Carotids 2+ bilat; no bruits. No lymphadenopathy or thryomegaly appreciated. Cor: PMI nondisplaced. Regular rate & rhythm. No rubs, gallops or murmurs. Lungs: clear Abdomen: obese soft, nontender, nondistended. No hepatosplenomegaly. No bruits or masses. Good bowel sounds. Extremities: no cyanosis, clubbing, rash, edema Neuro: alert & orientedx3, cranial nerves grossly intact. moves all 4 extremities w/o difficulty. Affect pleasant   ASSESSMENT & PLAN:  1. 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 - Echo today 08/03/20 EF 45-50% RV ok. Personally reviewed - NYHA  Class I- II. Volume status good today on exam, weight down. - Continue lasix 20 mg prn.  - Continue Entresto 97-103 mg bid. - Continue Farxiga 10 mg daily.  - Continue spiro 25 mg daily.  - Increase carvedilol to 6.25 mg bid.  - Stop digoxin - Will plan R/L cath to exclude underlying CAD - Labs today  2. PAF - Maintaining NSR post DCCV. - Continue amiodarone 200 mg daily. If EF improves, may be able to stop. - Continue Xarelto. No bleeding issues. - Stressed need to avoid ETOH - Sleep study ordered.   3. Obesity  Body mass index is 39.6 kg/m. - He has met with a nutritionist and is working on weight loss  Glori Bickers, MD 08/03/20

## 2020-08-03 ENCOUNTER — Encounter (HOSPITAL_COMMUNITY): Payer: Self-pay | Admitting: Internal Medicine

## 2020-08-03 ENCOUNTER — Other Ambulatory Visit (HOSPITAL_COMMUNITY): Payer: Self-pay | Admitting: *Deleted

## 2020-08-03 ENCOUNTER — Other Ambulatory Visit: Payer: Self-pay

## 2020-08-03 ENCOUNTER — Encounter (HOSPITAL_COMMUNITY): Payer: BC Managed Care – PPO

## 2020-08-03 ENCOUNTER — Ambulatory Visit (HOSPITAL_COMMUNITY)
Admission: RE | Admit: 2020-08-03 | Discharge: 2020-08-03 | Disposition: A | Discharge status: Home / Self Care | Payer: BC Managed Care – PPO | Source: Ambulatory Visit | Attending: Cardiology | Admitting: Cardiology

## 2020-08-03 ENCOUNTER — Ambulatory Visit (HOSPITAL_BASED_OUTPATIENT_CLINIC_OR_DEPARTMENT_OTHER)
Admission: RE | Admit: 2020-08-03 | Discharge: 2020-08-03 | Disposition: A | Discharge status: Home / Self Care | Payer: BC Managed Care – PPO | Source: Ambulatory Visit | Attending: Internal Medicine | Admitting: Internal Medicine

## 2020-08-03 VITALS — BP 140/88 | HR 73 | Wt 292.0 lb

## 2020-08-03 DIAGNOSIS — Z881 Allergy status to other antibiotic agents status: Secondary | ICD-10-CM | POA: Insufficient documentation

## 2020-08-03 DIAGNOSIS — I5022 Chronic systolic (congestive) heart failure: Secondary | ICD-10-CM | POA: Diagnosis not present

## 2020-08-03 DIAGNOSIS — Z91030 Bee allergy status: Secondary | ICD-10-CM | POA: Diagnosis not present

## 2020-08-03 DIAGNOSIS — Z7984 Long term (current) use of oral hypoglycemic drugs: Secondary | ICD-10-CM | POA: Diagnosis not present

## 2020-08-03 DIAGNOSIS — I480 Paroxysmal atrial fibrillation: Secondary | ICD-10-CM | POA: Diagnosis not present

## 2020-08-03 DIAGNOSIS — Z6839 Body mass index (BMI) 39.0-39.9, adult: Secondary | ICD-10-CM | POA: Insufficient documentation

## 2020-08-03 DIAGNOSIS — F4322 Adjustment disorder with anxiety: Secondary | ICD-10-CM | POA: Diagnosis not present

## 2020-08-03 DIAGNOSIS — I110 Hypertensive heart disease with heart failure: Secondary | ICD-10-CM | POA: Insufficient documentation

## 2020-08-03 DIAGNOSIS — E669 Obesity, unspecified: Secondary | ICD-10-CM | POA: Diagnosis not present

## 2020-08-03 DIAGNOSIS — Z880 Allergy status to penicillin: Secondary | ICD-10-CM | POA: Diagnosis not present

## 2020-08-03 DIAGNOSIS — G4733 Obstructive sleep apnea (adult) (pediatric): Secondary | ICD-10-CM

## 2020-08-03 DIAGNOSIS — Z79899 Other long term (current) drug therapy: Secondary | ICD-10-CM | POA: Insufficient documentation

## 2020-08-03 DIAGNOSIS — E785 Hyperlipidemia, unspecified: Secondary | ICD-10-CM | POA: Insufficient documentation

## 2020-08-03 DIAGNOSIS — Z7901 Long term (current) use of anticoagulants: Secondary | ICD-10-CM | POA: Insufficient documentation

## 2020-08-03 LAB — CBC
HCT: 53.1 % — ABNORMAL HIGH (ref 39.0–52.0)
Hemoglobin: 18 g/dL — ABNORMAL HIGH (ref 13.0–17.0)
MCH: 29.4 pg (ref 26.0–34.0)
MCHC: 33.9 g/dL (ref 30.0–36.0)
MCV: 86.8 fL (ref 80.0–100.0)
Platelets: 286 10*3/uL (ref 150–400)
RBC: 6.12 MIL/uL — ABNORMAL HIGH (ref 4.22–5.81)
RDW: 12.8 % (ref 11.5–15.5)
WBC: 9.1 10*3/uL (ref 4.0–10.5)
nRBC: 0 % (ref 0.0–0.2)

## 2020-08-03 LAB — ECHOCARDIOGRAM COMPLETE
Area-P 1/2: 2 cm2
Calc EF: 45.7 %
S' Lateral: 3.8 cm
Single Plane A2C EF: 47.5 %
Single Plane A4C EF: 44.1 %

## 2020-08-03 LAB — BASIC METABOLIC PANEL
Anion gap: 10 (ref 5–15)
BUN: 20 mg/dL (ref 6–20)
CO2: 24 mmol/L (ref 22–32)
Calcium: 9.9 mg/dL (ref 8.9–10.3)
Chloride: 102 mmol/L (ref 98–111)
Creatinine, Ser: 1.33 mg/dL — ABNORMAL HIGH (ref 0.61–1.24)
GFR, Estimated: >60 mL/min (ref >60)
Glucose, Bld: 94 mg/dL (ref 70–99)
Potassium: 3.8 mmol/L (ref 3.5–5.1)
Sodium: 136 mmol/L (ref 135–145)

## 2020-08-03 LAB — BRAIN NATRIURETIC PEPTIDE: B Natriuretic Peptide: 10.6 pg/mL (ref 0.0–100.0)

## 2020-08-03 MED ORDER — CARVEDILOL 6.25 MG PO TABS: 6.2500 mg | ORAL_TABLET | Freq: Two times a day (BID) | ORAL | 3 refills | Status: DC

## 2020-08-03 NOTE — Progress Notes (Signed)
  Echocardiogram 2D Echocardiogram has been performed.  Alexander [name] 08/03/2020, 11:26 AM

## 2020-08-03 NOTE — Patient Instructions (Signed)
Stop Digoxin  Increase Carvedilol to 6.25 mg Twice daily   Labs done today, your results will be available in MyChart, we will contact you for abnormal readings.  Heart Catheterization on Friday 4/8, see instructions below  Your physician recommends that you schedule a follow-up appointment in: 3 months  If you have any questions or concerns before your next appointment please send [place] a message through [place] or call our office at [phone].    TO LEAVE A MESSAGE FOR THE NURSE SELECT OPTION 2, PLEASE LEAVE A MESSAGE INCLUDING: . YOUR NAME . DATE OF BIRTH . CALL BACK NUMBER . REASON FOR CALL**this is important as we prioritize the call backs  YOU WILL RECEIVE A CALL BACK THE SAME DAY AS LONG AS YOU CALL BEFORE 4:00 PM  At the Advanced Heart Failure Clinic, you and your health needs are our priority. As part of our continuing mission to provide you with exceptional heart care, we have created designated Provider Care Teams. These Care Teams include your primary Cardiologist (physician) and Advanced Practice Providers (APPs- Physician Assistants and Nurse Practitioners) who all work together to provide you with the care you need, when you need it.   You may see any of the following providers on your designated Care Team at your next follow up: Marland Kitchen Dr Arvilla Meres . Dr Marca Ancona . Dr Thornell Mule . Tonye Becket, NP . Robbie Lis, PA . Shanda Bumps [place],NP . Karle Plumber, PharmD   Please be sure to bring in all your medications bottles to every appointment.       CARDIAC CATHETERIZATION:  You are scheduled for a Cardiac Catheterization on Friday, April 8 with Dr. Arvilla Meres.  1. Please arrive at the [facility] (Main Entrance A) at [facility]: [address] [place], [place] [address] at 8:30 AM (This time is two hours before your procedure to ensure your preparation). Free valet parking service is available.   Special note: Every effort is made to  have your procedure done on time. Please understand that emergencies sometimes delay scheduled procedures.  2. Diet: Do not eat solid foods after midnight.  The patient may have clear liquids until 5am upon the day of the procedure.  3. COVID TEST:   4. Medication instructions in preparation for your procedure:   THUR 4/7 DO NOT TAKE XARELTO  FRI 4/8 AM DO NOT TAKE FARXIGA, FUROSEMIDE, OR SPIRONOLACTONE  On the morning of your procedure, take any morning medicines NOT listed above.  You may use sips of water.  5. Plan for one night stay--bring personal belongings. 6. Bring a current list of your medications and current insurance cards. 7. You MUST have a responsible person to drive you home. 8. Someone MUST be with you the first 24 hours after you arrive home or your discharge will be delayed. 9. Please wear clothes that are easy to get on and off and wear slip-on shoes.  Thank you for allowing [place] to care for you!   --  Invasive Cardiovascular services

## 2020-08-03 NOTE — Progress Notes (Signed)
Cardiac Individual Treatment Plan  Patient Details  Name: Alexander [name] MRN: [id number] Date of Birth: Mar 02, 1976 Referring Provider:   Flowsheet Row CARDIAC REHAB PHASE II ORIENTATION from 07/16/2020 in [facility]  Referring Provider Bensimhon, Daniel MD      Initial Encounter Date:  [place] from 07/16/2020 in [place]  Date 07/16/20      Visit Diagnosis: Heart failure, systolic and diastolic, chronic ([place])  Acute combined systolic and diastolic HF (heart failure) ([place])  Patient's Home Medications on Admission:  Current Outpatient Medications:  .  acetaminophen (TYLENOL) 500 MG tablet, Take 500 mg by mouth every 6 (six) hours as needed for moderate pain., Disp: , Rfl:  .  allopurinol (ZYLOPRIM) 100 MG tablet, Take 100 mg by mouth daily., Disp: , Rfl:  .  amiodarone (PACERONE) 200 MG tablet, Take 200 mg by mouth daily., Disp: , Rfl:  .  bismuth subsalicylate (PEPTO BISMOL) 262 MG chewable tablet, Chew 524 mg by mouth daily as needed for indigestion., Disp: , Rfl:  .  buPROPion (WELLBUTRIN XL) 300 MG 24 hr tablet, Take 300 mg by mouth daily., Disp: , Rfl:  .  carvedilol (COREG) 6.25 MG tablet, Take 1 tablet (6.25 mg total) by mouth 2 (two) times daily with a meal., Disp: 60 tablet, Rfl: 3 .  clonazePAM (KLONOPIN) 0.5 MG tablet, Take 0.5 mg by mouth 2 (two) times daily as needed for anxiety., Disp: , Rfl:  .  colchicine 0.6 MG tablet, Take 0.6 mg by mouth 2 (two) times daily as needed (gout)., Disp: , Rfl:  .  dapagliflozin propanediol (FARXIGA) 10 MG TABS tablet, Take 1 tablet (10 mg total) by mouth daily., Disp: 30 tablet, Rfl: 6 .  fenofibrate (TRICOR) 145 MG tablet, Take 145 mg by mouth daily., Disp: , Rfl:  .  furosemide (LASIX) 20 MG tablet, Take 1 tablet (20 mg total) by mouth as needed for fluid or edema., Disp: 30 tablet, Rfl: 11 .  potassium chloride SA (KLOR-CON) 20 MEQ  tablet, Take 1 tablet (20 mEq total) by mouth as needed (with Lasix)., Disp: 90 tablet, Rfl: 3 .  sacubitril-valsartan (ENTRESTO) 97-103 MG, Take 1 tablet by mouth 2 (two) times daily., Disp: 60 tablet, Rfl: 6 .  spironolactone (ALDACTONE) 25 MG tablet, Take 1 tablet (25 mg total) by mouth daily., Disp: 30 tablet, Rfl: 6 .  traMADol (ULTRAM) 50 MG tablet, Take 50 mg by mouth 3 (three) times daily as needed for moderate pain., Disp: , Rfl:  .  XARELTO 20 MG TABS tablet, Take 1 tablet (20 mg total) by mouth daily., Disp: 30 tablet, Rfl: 3  Past Medical History: Past Medical History:  Diagnosis Date  . Arrhythmia   . Atrial fibrillation ([place])   . CHF (congestive heart failure) ([place])   . Gout   . Hyperlipidemia   . Hypertension     Tobacco Use: Social History   Tobacco Use  Smoking Status Never Smoker  Smokeless Tobacco Never Used    Labs: Recent Review Flowsheet Data    Labs for ITP Cardiac and Pulmonary Rehab Latest Ref Rng & Units 05/16/2020 05/16/2020 05/16/2020 05/17/2020 05/18/2020   Hemoglobin A1c 4.8 - 5.6 % - - - 6.0(H) -   O2SAT % 72.0 47.0 58.6 55.2 59.6      Capillary Blood Glucose: No results found for: GLUCAP   Exercise Target Goals: Exercise Program Goal: Individual exercise prescription set using results  from initial 6 min walk test and THRR while considering  patient's activity barriers and safety.   Exercise Prescription Goal: Initial exercise prescription builds to 30-45 minutes a day of aerobic activity, 2-3 days per week.  Home exercise guidelines will be given to patient during program as part of exercise prescription that the participant will acknowledge.  Activity Barriers & Risk Stratification:  Activity Barriers & Cardiac Risk Stratification - 07/16/20 1611      Activity Barriers & Cardiac Risk Stratification   Activity Barriers Arthritis;Joint Problems   Arthritis: bilateral ankles, low back, fingers. Joint problems: left shoulder weakness.   Cardiac  Risk Stratification High   Below 5 MET's on walk test          6 Minute Walk:  6 Minute Walk    Row Name 07/16/20 1607         6 Minute Walk   Phase Initial     Distance 1694 feet     Walk Time 6 minutes     # of Rest Breaks 0     MPH 3.21     METS 4.29     RPE 11     Perceived Dyspnea  0     VO2 Peak 15.03     Symptoms No     Resting HR 65 bpm     Resting BP 124/94     Resting Oxygen Saturation  96 %     Exercise Oxygen Saturation  during 6 min walk 97 %     Max Ex. HR 86 bpm     Max Ex. BP 142/94     2 Minute Post BP 138/94            Oxygen Initial Assessment:   Oxygen Re-Evaluation:   Oxygen Discharge (Final Oxygen Re-Evaluation):   Initial Exercise Prescription:  Initial Exercise Prescription - 07/16/20 1600      Date of Initial Exercise RX and Referring Provider   Date 07/16/20    Referring Provider Glori Bickers MD    Expected Discharge Date 09/11/20      Recumbant Bike   Level 2    RPM 60    Minutes 15    METs 2.5      Arm Ergometer   Level 1.5    RPM 60    Minutes 15    METs 2      Prescription Details   Frequency (times per week) 3    Duration Progress to 30 minutes of continuous aerobic without signs/symptoms of physical distress      Intensity   THRR 40-80% of Max Heartrate 71-142    Ratings of Perceived Exertion 11-13    Perceived Dyspnea 0-4      Progression   Progression Continue progressive overload as per policy without signs/symptoms or physical distress.      Resistance Training   Training Prescription Yes    Weight 5    Reps 10-15           Perform Capillary Blood Glucose checks as needed.  Exercise Prescription Changes:   Exercise Prescription Changes    Row Name 07/20/20 1127 07/27/20 1124 07/29/20 1118         Response to Exercise   Blood Pressure (Admit) 130/90 132/90 148/90     Blood Pressure (Exercise) 128/90 138/92 142/92     Blood Pressure (Exit) 122/88 142/94 120/90     Heart Rate  (Admit) 81 bpm 86 bpm 80 bpm  Heart Rate (Exercise) 87 bpm 99 bpm 107 bpm     Heart Rate (Exit) 74 bpm 85 bpm 81 bpm     Rating of Perceived Exertion (Exercise) _0 Symptoms none none none     Comments Off to a great start with exercise. -- --     Duration Continue with 30 min of aerobic exercise without signs/symptoms of physical distress. Continue with 30 min of aerobic exercise without signs/symptoms of physical distress. Continue with 30 min of aerobic exercise without signs/symptoms of physical distress.     Intensity THRR unchanged THRR unchanged THRR unchanged           Progression   Progression Continue to progress workloads to maintain intensity without signs/symptoms of physical distress. Continue to progress workloads to maintain intensity without signs/symptoms of physical distress. Continue to progress workloads to maintain intensity without signs/symptoms of physical distress.     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 Add 4 additional days to program exercise sessions. Add 4 additional days to program exercise sessions.     Initial Home Exercises Provided -- -- 07/29/20            Exercise Comments:    Exercise Comments    Row Name 07/20/20 1222 07/22/20 1200 07/29/20 1200       Exercise Comments Patient tolerated first session of exercise very well without symptoms. Patient feels the arm ergometer is too light for him, and we discussed changing his exercise equipment at cardiac rehab. Patient will switch from the recumbent bike and arm ergometer to the treadmill and recumbent step machine. Reviewed home exercise guidelines, METs, and goals with patient.            Exercise Goals and Review:   Exercise Goals    Row Name 07/16/20 1624             Exercise Goals   Increase Physical Activity Yes       Intervention Provide advice, education, support and  counseling about physical activity/exercise needs.;Develop an individualized exercise prescription for aerobic and resistive training based on initial evaluation findings, risk stratification, comorbidities and participant's personal goals.       Expected Outcomes Short Term: Attend rehab on a regular basis to increase amount of physical activity.;Long Term: Add in home exercise to make exercise part of routine and to increase amount of physical activity.;Long Term: Exercising regularly at least 3-5 days a week.       Increase Strength and Stamina Yes       Intervention Provide advice, education, support and counseling about physical activity/exercise needs.;Develop an individualized exercise prescription for aerobic and resistive training based on initial evaluation findings, risk stratification, comorbidities and participant's personal goals.       Expected Outcomes Short Term: Increase workloads from initial exercise prescription for resistance, speed, and METs.;Short Term: Perform resistance training exercises routinely during rehab and add in resistance training at home;Long Term: Improve cardiorespiratory fitness, muscular endurance and strength as measured by increased METs and functional capacity (6MWT)       Able to understand and use  rate of perceived exertion (RPE) scale Yes       Intervention Provide education and explanation on how to use RPE scale       Expected Outcomes Short Term: Able to use RPE daily in rehab to express subjective intensity level;Long Term:  Able to use RPE to guide intensity level when exercising independently       Knowledge and understanding of Target Heart Rate Range (THRR) Yes       Intervention Provide education and explanation of THRR including how the numbers were predicted and where they are located for reference       Expected Outcomes Short Term: Able to state/look up THRR;Long Term: Able to use THRR to govern intensity when exercising independently;Short Term: Able to use daily as guideline for intensity in rehab       Understanding of Exercise Prescription Yes       Intervention Provide education, explanation, and written materials on patient's individual exercise prescription       Expected Outcomes Short Term: Able to explain program exercise prescription;Long Term: Able to explain home exercise prescription to exercise independently              Exercise Goals Re-Evaluation :  Exercise Goals Re-Evaluation    Alexander [name] Name 07/20/20 1436 07/29/20 1200           Exercise Goal Re-Evaluation   Exercise Goals Review Increase Physical Activity;Able to understand and use rate of perceived exertion (RPE) scale;Able to check pulse independently Increase Physical Activity;Able to understand and use rate of perceived exertion (RPE) scale;Able to check pulse independently;Increase Strength and Stamina;Knowledge and understanding of Target Heart Rate Range (THRR);Understanding of Exercise Prescription      Comments Patient able to use RPE scale appropriately. Reviewed THRR and MET level with patient. Patient exercises at the gym 4-5 days/ week. Patient exercises aerobically 30 minutes and uses up to 20 lb weights for his resistance training. Patient has a smart watch to check his pulse. Patient is  making excellent progress with exercise. Patient is exercising aerobically at least 30 minutes, 3-4 days/week. Patient's goal is to live a healthy life and lose weight. Patient's goal weight is 220 lbs. Patient has a smart watch to monitor his heart rate.      Expected Outcomes Progress workloads as tolerated to help achieve personal health and fitness goals. Patient will continue his current  exericse routine and progress workloads as tolerated to help achieve his personal health and fitness goals.             Discharge Exercise Prescription (Final Exercise Prescription Changes):  Exercise Prescription Changes - 07/29/20 1118      Response to Exercise   Blood Pressure (Admit) 148/90    Blood Pressure (Exercise) 142/92    Blood Pressure (Exit) 120/90    Heart Rate (Admit) 80 bpm    Heart Rate (Exercise) 107 bpm    Heart Rate (Exit) 81 bpm    Rating of Perceived Exertion (Exercise) 11    Symptoms none    Duration Continue with 30 min of aerobic exercise without signs/symptoms of physical distress.    Intensity THRR unchanged      Progression   Progression Continue to progress workloads to maintain intensity without signs/symptoms of physical distress.    Average METs 3.8      Resistance Training   Training Prescription No      Interval Training   Interval Training No      Treadmill   MPH 3.2    Grade 2    Minutes 15    METs 4.33      NuStep   Level 5    SPM 85    Minutes 15    METs 3.2      Home Exercise Plan   Plans to continue exercise at [employer] [employer] (comment)    Frequency Add 4 additional days to program exercise sessions.    Initial Home Exercises Provided 07/29/20           Nutrition:  Target Goals: Understanding of nutrition guidelines, daily intake of sodium <1574m, cholesterol <2052m calories 30% from fat and 7% or less from saturated fats, daily to have 5 or more servings of fruits and vegetables.  Biometrics:  Pre Biometrics - 07/16/20 1605       Pre Biometrics   Waist Circumference 52.25 inches    Hip Circumference 48 inches    Waist to Hip Ratio 1.09 %    Triceps Skinfold 24 mm    % Body Fat 38.2 %    Grip Strength 58 kg    Flexibility 2 in    Single Leg Stand 15 seconds            Nutrition Therapy Plan and Nutrition Goals:  Nutrition Therapy & Goals - 07/31/20 1211      Nutrition Therapy   Diet TLC; low sodium      Personal Nutrition Goals   Nutrition Goal Pt to identify and limit food sources of saturated fat, trans fat, refined carbohydrates and sodium    Personal Goal #2 Pt to identify food quantities necessary to achieve weight loss of 6-24 lb at graduation from cardiac rehab.      Intervention Plan   Intervention Prescribe, educate and counsel regarding individualized specific dietary modifications aiming towards targeted core components such as weight, hypertension, lipid management, diabetes, heart failure and other comorbidities.    Expected Outcomes Short Term Goal: A plan has been developed with personal nutrition goals set during dietitian appointment.;Long Term Goal: Adherence to prescribed nutrition plan.           Nutrition Assessments:  MEDIFICTS Score Key:  ?70 Need to make dietary changes   40-70 Heart Healthy Diet  ? 40 Therapeutic Level Cholesterol Diet    Picture Your Plate Scores:  <4<45nhealthy dietary pattern with much room for improvement.  41-50 Dietary  pattern unlikely to meet recommendations for good health and room for improvement.  51-60 More healthful dietary pattern, with some room for improvement.   >60 Healthy dietary pattern, although there may be some specific behaviors that could be improved.    Nutrition Goals Re-Evaluation:  Nutrition Goals Re-Evaluation    Alexander [name] Name 07/31/20 1211             Goals   Current Weight 288 lb (130.6 kg)       Expected Outcome Weight loss and sodium restriction              Nutrition Goals Re-Evaluation:   Nutrition Goals Re-Evaluation    Alexander [name] Name 07/31/20 1211             Goals   Current Weight 288 lb (130.6 kg)       Expected Outcome Weight loss and sodium restriction              Nutrition Goals Discharge (Final Nutrition Goals Re-Evaluation):  Nutrition Goals Re-Evaluation - 07/31/20 1211      Goals   Current Weight 288 lb (130.6 kg)    Expected Outcome Weight loss and sodium restriction           Psychosocial: Target Goals: Acknowledge presence or absence of significant depression and/or stress, maximize coping skills, provide positive support system. Participant is able to verbalize types and ability to use techniques and skills needed for reducing stress and depression.  Initial Review & Psychosocial Screening:  Initial Psych Review & Screening - 07/17/20 0837      Initial Review   Current issues with History of Depression;Current Anxiety/Panic      Family Dynamics   Good Support System? Yes   Alexander [name] is divorced. Alexander [name] has his ex wife and friends for support   Comments Alexander [name] has anxiety adjustment disorder      Barriers   Psychosocial barriers to participate in program The patient should benefit from training in stress management and relaxation.      Screening Interventions   Interventions To provide support and resources with identified psychosocial needs;Encouraged to exercise;Provide feedback about the scores to participant    Expected Outcomes Long Term Goal: Stressors or current issues are controlled or eliminated.;Short Term goal: Identification and review with participant of any Quality of Life or Depression concerns found by scoring the questionnaire.           Quality of Life Scores:  Quality of Life - 07/16/20 1628      Quality of Life   Select Quality of Life      Quality of Life Scores   Health/Function Pre 22.77 %    Socioeconomic Pre 27.43 %    Psych/Spiritual Pre 19.17 %    Family Pre 24.3 %    GLOBAL Pre 23.33 %          Scores  of 19 and below usually indicate a poorer quality of life in these areas.  A difference of  2-3 points is a clinically meaningful difference.  A difference of 2-3 points in the total score of the Quality of Life Index has been associated with significant improvement in overall quality of life, self-image, physical symptoms, and general health in studies assessing change in quality of life.  PHQ-9: Recent Review Flowsheet Data    Depression screen [facility] 2/9 07/17/2020 02/24/2020   Decreased Interest 0 0   Down, Depressed, Hopeless 0 0   PHQ - 2 Score 0 0  Interpretation of Total Score  Total Score Depression Severity:  1-4 = Minimal depression, 5-9 = Mild depression, 10-14 = Moderate depression, 15-19 = Moderately severe depression, 20-27 = Severe depression   Psychosocial Evaluation and Intervention:   Psychosocial Re-Evaluation:  Psychosocial Re-Evaluation    Row Name 08/04/20 0810             Psychosocial Re-Evaluation   Current issues with Current Anxiety/Panic;History of Depression       Comments Jun[place] has not voiced any increased concerns or stressors       Expected Outcomes Alexander [name] will have decreased stress upon completion of phase 2 cardiac rehab       Interventions Encouraged to attend Cardiac Rehabilitation for the exercise       Continue Psychosocial Services  No Follow up required              Psychosocial Discharge (Final Psychosocial Re-Evaluation):  Psychosocial Re-Evaluation - 08/04/20 0810      Psychosocial Re-Evaluation   Current issues with Current Anxiety/Panic;History of Depression    Comments Alexander [name] has not voiced any increased concerns or stressors    Expected Outcomes Alexander [name] will have decreased stress upon completion of phase 2 cardiac rehab    Interventions Encouraged to attend Cardiac Rehabilitation for the exercise    Continue Psychosocial Services  No Follow up required           Vocational Rehabilitation: Provide vocational rehab  assistance to qualifying candidates.   Vocational Rehab Evaluation & Intervention:  Vocational Rehab - 07/17/20 0841      Initial Vocational Rehab Evaluation & Intervention   Assessment shows need for Vocational Rehabilitation No   Alexander [name] is working full time and does not need vocational rehab at this time          Education: Education Goals: Education classes will be provided on a weekly basis, covering required topics. Participant will state understanding/return demonstration of topics presented.  Learning Barriers/Preferences:  Learning Barriers/Preferences - 07/16/20 1634      Learning Barriers/Preferences   Learning Barriers None    Learning Preferences Individual Instruction;Computer/Internet           Education Topics: Count Your Pulse:  -Group instruction provided by verbal instruction, demonstration, patient participation and written materials to support subject.  Instructors address importance of being able to find your pulse and how to count your pulse when at home without a heart monitor.  Patients get hands on experience counting their pulse with staff help and individually.   Heart Attack, Angina, and Risk Factor Modification:  -Group instruction provided by verbal instruction, video, and written materials to support subject.  Instructors address signs and symptoms of angina and heart attacks.    Also discuss risk factors for heart disease and how to make changes to improve heart health risk factors.   Functional Fitness:  -Group instruction provided by verbal instruction, demonstration, patient participation, and written materials to support subject.  Instructors address safety measures for doing things around the house.  Discuss how to get up and down off the floor, how to pick things up properly, how to safely get out of a chair without assistance, and balance training.   Meditation and Mindfulness:  -Group instruction provided by verbal instruction, patient  participation, and written materials to support subject.  Instructor addresses importance of mindfulness and meditation practice to help reduce stress and improve awareness.  Instructor also leads participants through a meditation exercise.    Stretching for Flexibility and Mobility:  -  Group instruction provided by verbal instruction, patient participation, and written materials to support subject.  Instructors lead participants through series of stretches that are designed to increase flexibility thus improving mobility.  These stretches are additional exercise for major muscle groups that are typically performed during regular warm up and cool down.   Hands Only CPR:  -Group verbal, video, and participation provides a basic overview of AHA guidelines for community CPR. Role-play of emergencies allow participants the opportunity to practice calling for help and chest compression technique with discussion of AED use.   Hypertension: -Group verbal and written instruction that provides a basic overview of hypertension including the most recent diagnostic guidelines, risk factor reduction with self-care instructions and medication management.    Nutrition I class: Heart Healthy Eating:  -Group instruction provided by PowerPoint slides, verbal discussion, and written materials to support subject matter. The instructor gives an explanation and review of the Therapeutic Lifestyle Changes diet recommendations, which includes a discussion on lipid goals, dietary fat, sodium, fiber, plant stanol/sterol esters, sugar, and the components of a well-balanced, healthy diet.   Nutrition II class: Lifestyle Skills:  -Group instruction provided by PowerPoint slides, verbal discussion, and written materials to support subject matter. The instructor gives an explanation and review of label reading, grocery shopping for heart health, heart healthy recipe modifications, and ways to make healthier choices when eating  out.   Diabetes Question & Answer:  -Group instruction provided by PowerPoint slides, verbal discussion, and written materials to support subject matter. The instructor gives an explanation and review of diabetes co-morbidities, pre- and post-prandial blood glucose goals, pre-exercise blood glucose goals, signs, symptoms, and treatment of hypoglycemia and hyperglycemia, and foot care basics.   Diabetes Blitz:  -Group instruction provided by PowerPoint slides, verbal discussion, and written materials to support subject matter. The instructor gives an explanation and review of the physiology behind type 1 and type 2 diabetes, diabetes medications and rational behind using different medications, pre- and post-prandial blood glucose recommendations and Hemoglobin A1c goals, diabetes diet, and exercise including blood glucose guidelines for exercising safely.    Portion Distortion:  -Group instruction provided by PowerPoint slides, verbal discussion, written materials, and food models to support subject matter. The instructor gives an explanation of serving size versus portion size, changes in portions sizes over the last 20 years, and what consists of a serving from each food group.   Stress Management:  -Group instruction provided by verbal instruction, video, and written materials to support subject matter.  Instructors review role of stress in heart disease and how to cope with stress positively.     Exercising on Your Own:  -Group instruction provided by verbal instruction, power point, and written materials to support subject.  Instructors discuss benefits of exercise, components of exercise, frequency and intensity of exercise, and end points for exercise.  Also discuss use of nitroglycerin and activating EMS.  Review options of places to exercise outside of rehab.  Review guidelines for sex with heart disease.   Cardiac Drugs I:  -Group instruction provided by verbal instruction and  written materials to support subject.  Instructor reviews cardiac drug classes: antiplatelets, anticoagulants, beta blockers, and statins.  Instructor discusses reasons, side effects, and lifestyle considerations for each drug class.   Cardiac Drugs II:  -Group instruction provided by verbal instruction and written materials to support subject.  Instructor reviews cardiac drug classes: angiotensin converting enzyme inhibitors (ACE-I), angiotensin II receptor blockers (ARBs), nitrates, and calcium channel blockers.  Instructor  discusses reasons, side effects, and lifestyle considerations for each drug class.   Anatomy and Physiology of the Circulatory System:  Group verbal and written instruction and models provide basic cardiac anatomy and physiology, with the coronary electrical and arterial systems. Review of: AMI, Angina, Valve disease, Heart Failure, Peripheral Artery Disease, Cardiac Arrhythmia, Pacemakers, and the ICD.   Other Education:  -Group or individual verbal, written, or video instructions that support the educational goals of the cardiac rehab program.   Holiday Eating Survival Tips:  -Group instruction provided by PowerPoint slides, verbal discussion, and written materials to support subject matter. The instructor gives patients tips, tricks, and techniques to help them not only survive but enjoy the holidays despite the onslaught of food that accompanies the holidays.   Knowledge Questionnaire Score:  Knowledge Questionnaire Score - 07/16/20 1633      Knowledge Questionnaire Score   Pre Score 21/24           Core Components/Risk Factors/Patient Goals at Admission:  Personal Goals and Risk Factors at Admission - 07/16/20 1635      Core Components/Risk Factors/Patient Goals on Admission    Weight Management Yes;Weight Loss    Intervention Weight Management: Develop a combined nutrition and exercise program designed to reach desired caloric intake, while maintaining  appropriate intake of nutrient and fiber, sodium and fats, and appropriate energy expenditure required for the weight goal.;Weight Management: Provide education and appropriate resources to help participant work on and attain dietary goals.;Weight Management/Obesity: Establish reasonable short term and long term weight goals.;Obesity: Provide education and appropriate resources to help participant work on and attain dietary goals.    Heart Failure Yes    Intervention Provide a combined exercise and nutrition program that is supplemented with education, support and counseling about heart failure. Directed toward relieving symptoms such as shortness of breath, decreased exercise tolerance, and extremity edema.    Expected Outcomes Improve functional capacity of life;Short term: Attendance in program 2-3 days a week with increased exercise capacity. Reported lower sodium intake. Reported increased fruit and vegetable intake. Reports medication compliance.;Short term: Daily weights obtained and reported for increase. Utilizing diuretic protocols set by physician.;Long term: Adoption of self-care skills and reduction of barriers for early signs and symptoms recognition and intervention leading to self-care maintenance.    Hypertension Yes    Intervention Provide education on lifestyle modifcations including regular physical activity/exercise, weight management, moderate sodium restriction and increased consumption of fresh fruit, vegetables, and low fat dairy, alcohol moderation, and smoking cessation.;Monitor prescription use compliance.    Expected Outcomes Short Term: Continued assessment and intervention until BP is < 140/84m HG in hypertensive participants. < 130/837mHG in hypertensive participants with diabetes, heart failure or chronic kidney disease.;Long Term: Maintenance of blood pressure at goal levels.    Lipids Yes    Intervention Provide education and support for participant on nutrition &  aerobic/resistive exercise along with prescribed medications to achieve LDL <7068mHDL >6m19m  Expected Outcomes Short Term: Participant states understanding of desired cholesterol values and is compliant with medications prescribed. Participant is following exercise prescription and nutrition guidelines.;Long Term: Cholesterol controlled with medications as prescribed, with individualized exercise RX and with personalized nutrition plan. Value goals: LDL < 70mg36mL > 40 mg.    Stress Yes    Intervention Offer individual and/or small group education and counseling on adjustment to heart disease, stress management and health-related lifestyle change. Teach and support self-help strategies.;Refer participants experiencing significant psychosocial distress to appropriate  mental health specialists for further evaluation and treatment. When possible, include family members and significant others in education/counseling sessions.    Expected Outcomes Short Term: Participant demonstrates changes in health-related behavior, relaxation and other stress management skills, ability to obtain effective social support, and compliance with psychotropic medications if prescribed.;Long Term: Emotional wellbeing is indicated by absence of clinically significant psychosocial distress or social isolation.    Personal Goal Other Yes    Personal Goal Pt wants to form sustaining healthy habits that he can maintain through life. Pt wants to lose 60 lbs over time    Intervention Will work with pt to create resonable steps to help achive his goals.    Expected Outcomes Will help pt assess goals through education and monitoring.           Core Components/Risk Factors/Patient Goals Review:   Goals and Risk Factor Review    Row Name 07/20/20 1600 08/04/20 7616           Core Components/Risk Factors/Patient Goals Review   Personal Goals Review Weight Management/Obesity;Heart Failure;Stress;Hypertension;Lipids Weight  Management/Obesity;Heart Failure;Stress;Hypertension;Lipids      Review Alexander [name] started exercise on 07/20/20 Did well Alexander [name] continues to have some systolic BP's in the 07'P. Will continue to monitor Alexander [name] has been doing well with exercise. Alexander [name]'s diastolic BP's continue to run in the 90's. Exercise flow sheets with BP's were forwarded to Dr Bensimhon's office for review.      Expected Outcomes Alexander [name] will continue to participate in phase 2 cardiac rehab for exercise, nutrition and lifestyle modifications Alexander [name] will continue to participate in phase 2 cardiac rehab for exercise, nutrition and lifestyle modifications             Core Components/Risk Factors/Patient Goals at Discharge (Final Review):   Goals and Risk Factor Review - 08/04/20 0822      Core Components/Risk Factors/Patient Goals Review   Personal Goals Review Weight Management/Obesity;Heart Failure;Stress;Hypertension;Lipids    Review Alexander [name] has been doing well with exercise. Alexander [name]'s diastolic BP's continue to run in the 90's. Exercise flow sheets with BP's were forwarded to Dr Bensimhon's office for review.    Expected Outcomes Alexander [name] will continue to participate in phase 2 cardiac rehab for exercise, nutrition and lifestyle modifications           ITP Comments:  ITP Comments    Row Name 07/16/20 1433 07/20/20 1559 08/04/20 0809       ITP Comments Dr Fransico Him MD, Medical Director 30 day ITP Review. Alexander [name] started exercise on 07/20/20 and did well with exercise. 30 day ITP Review. 30 Day ITP Review. Alexander [name] has good attendance and participation in phase 2 cardiac rehab            Comments: See ITP comments

## 2020-08-03 NOTE — Addendum Note (Signed)
Encounter addended by: Dolores Patty, MD on: 08/03/2020 2:24 PM  Actions taken: Level of Service modified, Visit diagnoses modified

## 2020-08-04 ENCOUNTER — Encounter (HOSPITAL_COMMUNITY): Payer: Self-pay

## 2020-08-04 ENCOUNTER — Telehealth (HOSPITAL_COMMUNITY): Payer: Self-pay | Admitting: *Deleted

## 2020-08-04 NOTE — Telephone Encounter (Signed)
-----   Message from Dolores Patty, MD sent at 08/04/2020  8:36 AM EDT ----- Regarding: RE: Conitnuing cardiac rehab before r/l heart cath Absolutely. Thanks ----- Message ----- From: Cammy Copa, RN Sent: 08/04/2020   8:16 AM EDT To: Dolores Patty, MD Subject: Conitnuing cardiac rehab before r/l heart ca#  Good morning Dr Gala Romney,  I wanted to check to make sure that you are okay with Sotirios continuing cardiac rehab until her catheterization on 08/14/20.     Thank you for your input!  Sincerely, [facility]

## 2020-08-05 ENCOUNTER — Encounter (HOSPITAL_COMMUNITY): Payer: BC Managed Care – PPO

## 2020-08-05 ENCOUNTER — Telehealth (HOSPITAL_COMMUNITY): Payer: Self-pay | Admitting: Family Medicine

## 2020-08-07 ENCOUNTER — Other Ambulatory Visit: Payer: Self-pay

## 2020-08-07 ENCOUNTER — Encounter (HOSPITAL_COMMUNITY)
Admission: RE | Admit: 2020-08-07 | Discharge: 2020-08-07 | Disposition: A | Discharge status: Home / Self Care | Payer: BC Managed Care – PPO | Source: Ambulatory Visit | Attending: Internal Medicine | Admitting: Internal Medicine

## 2020-08-07 DIAGNOSIS — I5042 Chronic combined systolic (congestive) and diastolic (congestive) heart failure: Secondary | ICD-10-CM

## 2020-08-07 DIAGNOSIS — I5041 Acute combined systolic (congestive) and diastolic (congestive) heart failure: Secondary | ICD-10-CM | POA: Diagnosis present

## 2020-08-10 ENCOUNTER — Encounter (HOSPITAL_COMMUNITY): Payer: BC Managed Care – PPO

## 2020-08-11 ENCOUNTER — Ambulatory Visit: Payer: BC Managed Care – PPO | Admitting: Cardiology

## 2020-08-11 DIAGNOSIS — I4819 Other persistent atrial fibrillation: Secondary | ICD-10-CM

## 2020-08-11 DIAGNOSIS — I5022 Chronic systolic (congestive) heart failure: Secondary | ICD-10-CM

## 2020-08-11 NOTE — Progress Notes (Unsigned)
na

## 2020-08-12 ENCOUNTER — Telehealth (HOSPITAL_COMMUNITY): Payer: Self-pay | Admitting: Vascular Surgery

## 2020-08-12 ENCOUNTER — Encounter (HOSPITAL_COMMUNITY)
Admission: RE | Admit: 2020-08-12 | Discharge: 2020-08-12 | Disposition: A | Discharge status: Home / Self Care | Payer: BC Managed Care – PPO | Source: Ambulatory Visit | Attending: Internal Medicine | Admitting: Internal Medicine

## 2020-08-12 ENCOUNTER — Other Ambulatory Visit (HOSPITAL_COMMUNITY): Payer: BC Managed Care – PPO

## 2020-08-12 ENCOUNTER — Other Ambulatory Visit: Payer: Self-pay

## 2020-08-12 DIAGNOSIS — I5042 Chronic combined systolic (congestive) and diastolic (congestive) heart failure: Secondary | ICD-10-CM | POA: Diagnosis not present

## 2020-08-12 DIAGNOSIS — I5041 Acute combined systolic (congestive) and diastolic (congestive) heart failure: Secondary | ICD-10-CM

## 2020-08-12 NOTE — Progress Notes (Signed)
Alexander [name] returned to exercise after being out of town for a golf trip with his friends. Weight is 133.3 kg which is 1.5 kg up from his last session. [name] admits to eating "out of his diet while he was out of town. Upon assessment lung fields clear upon ascultation. No peripheral edema noted. Oxygen saturation 97% on room air. [name] says he will take a furosemide and watch is sodium intake. [name] just increased his coreg dose this morning. Will continue to monitor the patient throughout  the program.Kuulei Kleier Harlon Flor, RN,BSN 08/12/2020 12:05 PM

## 2020-08-12 NOTE — Telephone Encounter (Signed)
Attempting to contact patient to reschedule procedure  No answer unable to leave message voicemail full [phone] (M)  .

## 2020-08-12 NOTE — Telephone Encounter (Signed)
Pt called to cancel and resch heart cath that is scheduled w. DB on Friday 4/8.Marland Kitchen please advise

## 2020-08-14 ENCOUNTER — Encounter (HOSPITAL_COMMUNITY): Payer: Self-pay | Admitting: *Deleted

## 2020-08-14 ENCOUNTER — Other Ambulatory Visit (HOSPITAL_COMMUNITY)
Admission: RE | Admit: 2020-08-14 | Discharge: 2020-08-14 | Disposition: A | Discharge status: Home / Self Care | Payer: BC Managed Care – PPO | Source: Ambulatory Visit | Attending: Internal Medicine | Admitting: Internal Medicine

## 2020-08-14 ENCOUNTER — Telehealth (HOSPITAL_COMMUNITY): Payer: Self-pay | Admitting: *Deleted

## 2020-08-14 ENCOUNTER — Other Ambulatory Visit: Payer: Self-pay

## 2020-08-14 ENCOUNTER — Encounter (HOSPITAL_COMMUNITY): Payer: BC Managed Care – PPO

## 2020-08-14 ENCOUNTER — Encounter (HOSPITAL_COMMUNITY)
Admission: RE | Admit: 2020-08-14 | Discharge: 2020-08-14 | Disposition: A | Discharge status: Home / Self Care | Payer: BC Managed Care – PPO | Source: Ambulatory Visit | Attending: Internal Medicine | Admitting: Internal Medicine

## 2020-08-14 DIAGNOSIS — I480 Paroxysmal atrial fibrillation: Secondary | ICD-10-CM | POA: Diagnosis not present

## 2020-08-14 DIAGNOSIS — Z880 Allergy status to penicillin: Secondary | ICD-10-CM | POA: Diagnosis not present

## 2020-08-14 DIAGNOSIS — Z20822 Contact with and (suspected) exposure to covid-19: Secondary | ICD-10-CM | POA: Insufficient documentation

## 2020-08-14 DIAGNOSIS — E669 Obesity, unspecified: Secondary | ICD-10-CM | POA: Diagnosis not present

## 2020-08-14 DIAGNOSIS — Z01812 Encounter for preprocedural laboratory examination: Secondary | ICD-10-CM | POA: Insufficient documentation

## 2020-08-14 DIAGNOSIS — Z888 Allergy status to other drugs, medicaments and biological substances status: Secondary | ICD-10-CM | POA: Diagnosis not present

## 2020-08-14 DIAGNOSIS — E785 Hyperlipidemia, unspecified: Secondary | ICD-10-CM | POA: Diagnosis not present

## 2020-08-14 DIAGNOSIS — Z79899 Other long term (current) drug therapy: Secondary | ICD-10-CM | POA: Diagnosis not present

## 2020-08-14 DIAGNOSIS — I5022 Chronic systolic (congestive) heart failure: Secondary | ICD-10-CM | POA: Diagnosis present

## 2020-08-14 DIAGNOSIS — Z6839 Body mass index (BMI) 39.0-39.9, adult: Secondary | ICD-10-CM | POA: Diagnosis not present

## 2020-08-14 DIAGNOSIS — I5042 Chronic combined systolic (congestive) and diastolic (congestive) heart failure: Secondary | ICD-10-CM | POA: Diagnosis not present

## 2020-08-14 DIAGNOSIS — I2510 Atherosclerotic heart disease of native coronary artery without angina pectoris: Secondary | ICD-10-CM | POA: Diagnosis not present

## 2020-08-14 DIAGNOSIS — I5041 Acute combined systolic (congestive) and diastolic (congestive) heart failure: Secondary | ICD-10-CM

## 2020-08-14 DIAGNOSIS — Z881 Allergy status to other antibiotic agents status: Secondary | ICD-10-CM | POA: Diagnosis not present

## 2020-08-14 DIAGNOSIS — I110 Hypertensive heart disease with heart failure: Secondary | ICD-10-CM | POA: Diagnosis not present

## 2020-08-14 DIAGNOSIS — Z7901 Long term (current) use of anticoagulants: Secondary | ICD-10-CM | POA: Diagnosis not present

## 2020-08-14 DIAGNOSIS — Z7984 Long term (current) use of oral hypoglycemic drugs: Secondary | ICD-10-CM | POA: Diagnosis not present

## 2020-08-14 NOTE — Telephone Encounter (Signed)
Received message from the answering service, pt called this AM to report elevated BP and lightheadedness  Returned call to pt, he states the past 3 days he has just been feeling "off" he reports he just returned from a 5 day golf trip earlier this week and pick up new Carvedilol dose which he started Tue (6.25 mg BID). He states Wed at [facility] his wt was up 2 lbs from the previous week so he took Lasix Wed and Thur. He reports BP has been 140-150s over 100s but will come down. This AM BP 165/110, he took his meds and it came down to 128/84. The lightheadedness is only slightly when he first stands and then it immediately resolves. He also reports back pain r/t all the golf he was playing and admits to not eating well and having some drinks while on the trip.  We discussed Carvedilol increase will help with BP, check just 2 or 3 times a day, we discussed pain can also cause BP to be elevated as well as anxiety, he did take a klonopin this AM and will not take any more Lasix. He will continue to monitor and let us know if BP continues to run high or lightheadedness continues. He is going to [facility] today, his L/R HC has been resch from today to Tue 4/12 due to his childcare situation.

## 2020-08-15 LAB — SARS CORONAVIRUS 2 (TAT 6-24 HRS): SARS Coronavirus 2: NEGATIVE

## 2020-08-17 ENCOUNTER — Ambulatory Visit: Payer: BC Managed Care – PPO | Admitting: Orthopaedic Surgery

## 2020-08-17 ENCOUNTER — Ambulatory Visit (HOSPITAL_COMMUNITY)
Admission: RE | Disposition: A | Discharge status: Home / Self Care | Payer: Self-pay | Source: Home / Self Care | Attending: Internal Medicine

## 2020-08-17 ENCOUNTER — Other Ambulatory Visit: Payer: Self-pay

## 2020-08-17 ENCOUNTER — Ambulatory Visit (HOSPITAL_COMMUNITY)
Admission: RE | Admit: 2020-08-17 | Discharge: 2020-08-17 | Disposition: A | Discharge status: Home / Self Care | Payer: BC Managed Care – PPO | Attending: Internal Medicine | Admitting: Internal Medicine

## 2020-08-17 ENCOUNTER — Encounter (HOSPITAL_COMMUNITY): Payer: BC Managed Care – PPO

## 2020-08-17 ENCOUNTER — Other Ambulatory Visit (HOSPITAL_COMMUNITY): Payer: Self-pay

## 2020-08-17 DIAGNOSIS — Z888 Allergy status to other drugs, medicaments and biological substances status: Secondary | ICD-10-CM | POA: Insufficient documentation

## 2020-08-17 DIAGNOSIS — I480 Paroxysmal atrial fibrillation: Secondary | ICD-10-CM | POA: Insufficient documentation

## 2020-08-17 DIAGNOSIS — I5022 Chronic systolic (congestive) heart failure: Secondary | ICD-10-CM | POA: Diagnosis not present

## 2020-08-17 DIAGNOSIS — E669 Obesity, unspecified: Secondary | ICD-10-CM | POA: Insufficient documentation

## 2020-08-17 DIAGNOSIS — Z7901 Long term (current) use of anticoagulants: Secondary | ICD-10-CM | POA: Insufficient documentation

## 2020-08-17 DIAGNOSIS — Z79899 Other long term (current) drug therapy: Secondary | ICD-10-CM | POA: Insufficient documentation

## 2020-08-17 DIAGNOSIS — I25110 Atherosclerotic heart disease of native coronary artery with unstable angina pectoris: Secondary | ICD-10-CM

## 2020-08-17 DIAGNOSIS — I110 Hypertensive heart disease with heart failure: Secondary | ICD-10-CM | POA: Diagnosis not present

## 2020-08-17 DIAGNOSIS — I2510 Atherosclerotic heart disease of native coronary artery without angina pectoris: Secondary | ICD-10-CM | POA: Diagnosis not present

## 2020-08-17 DIAGNOSIS — Z20822 Contact with and (suspected) exposure to covid-19: Secondary | ICD-10-CM | POA: Insufficient documentation

## 2020-08-17 DIAGNOSIS — Z881 Allergy status to other antibiotic agents status: Secondary | ICD-10-CM | POA: Insufficient documentation

## 2020-08-17 DIAGNOSIS — Z7984 Long term (current) use of oral hypoglycemic drugs: Secondary | ICD-10-CM | POA: Insufficient documentation

## 2020-08-17 DIAGNOSIS — Z955 Presence of coronary angioplasty implant and graft: Secondary | ICD-10-CM

## 2020-08-17 DIAGNOSIS — E785 Hyperlipidemia, unspecified: Secondary | ICD-10-CM | POA: Diagnosis present

## 2020-08-17 DIAGNOSIS — Z880 Allergy status to penicillin: Secondary | ICD-10-CM | POA: Insufficient documentation

## 2020-08-17 DIAGNOSIS — Z6839 Body mass index (BMI) 39.0-39.9, adult: Secondary | ICD-10-CM | POA: Insufficient documentation

## 2020-08-17 HISTORY — DX: Atherosclerotic heart disease of native coronary artery without angina pectoris: I25.10

## 2020-08-17 HISTORY — PX: RIGHT/LEFT HEART CATH AND CORONARY ANGIOGRAPHY: CATH118266

## 2020-08-17 HISTORY — PX: CORONARY STENT INTERVENTION: CATH118234

## 2020-08-17 LAB — POCT I-STAT 7, (LYTES, BLD GAS, ICA,H+H)
Acid-base deficit: 1 mmol/L (ref 0.0–2.0)
Bicarbonate: 23.8 mmol/L (ref 20.0–28.0)
Calcium, Ion: 1.1 mmol/L — ABNORMAL LOW (ref 1.15–1.40)
HCT: 48 % (ref 39.0–52.0)
Hemoglobin: 16.3 g/dL (ref 13.0–17.0)
O2 Saturation: 99 %
Potassium: 3.7 mmol/L (ref 3.5–5.1)
Sodium: 141 mmol/L (ref 135–145)
TCO2: 25 mmol/L (ref 22–32)
pCO2 arterial: 38.4 mmHg (ref 32.0–48.0)
pH, Arterial: 7.4 (ref 7.350–7.450)
pO2, Arterial: 162 mmHg — ABNORMAL HIGH (ref 83.0–108.0)

## 2020-08-17 LAB — POCT ACTIVATED CLOTTING TIME
Activated Clotting Time: 244 seconds
Activated Clotting Time: 285 seconds

## 2020-08-17 SURGERY — RIGHT/LEFT HEART CATH AND CORONARY ANGIOGRAPHY
Anesthesia: LOCAL

## 2020-08-17 MED ORDER — HEPARIN (PORCINE) IN NACL 1000-0.9 UT/500ML-% IV SOLN
INTRAVENOUS | Status: AC
  Filled 2020-08-17: qty 1000

## 2020-08-17 MED ORDER — HEPARIN SODIUM (PORCINE) 1000 UNIT/ML IJ SOLN
INTRAMUSCULAR | Status: AC
  Filled 2020-08-17: qty 1

## 2020-08-17 MED ORDER — CLOPIDOGREL BISULFATE 75 MG PO TABS
75.0000 mg | ORAL_TABLET | Freq: Every day | ORAL | 3 refills | Status: DC
  Filled 2020-08-17: qty 90, 90d supply, fill #0

## 2020-08-17 MED ORDER — HEPARIN (PORCINE) IN NACL 1000-0.9 UT/500ML-% IV SOLN
INTRAVENOUS | Status: DC | PRN
  Administered 2020-08-17 (×2): 500 mL

## 2020-08-17 MED ORDER — LIDOCAINE HCL (PF) 1 % IJ SOLN
INTRAMUSCULAR | Status: DC | PRN
  Administered 2020-08-17 (×2): 2 mL via SUBCUTANEOUS

## 2020-08-17 MED ORDER — IOHEXOL 350 MG/ML SOLN
INTRAVENOUS | Status: DC | PRN
  Administered 2020-08-17: 65 mL via INTRA_ARTERIAL

## 2020-08-17 MED ORDER — MIDAZOLAM HCL 2 MG/2ML IJ SOLN
INTRAMUSCULAR | Status: AC
  Filled 2020-08-17: qty 2

## 2020-08-17 MED ORDER — LIDOCAINE HCL (PF) 1 % IJ SOLN
INTRAMUSCULAR | Status: AC
  Filled 2020-08-17: qty 30

## 2020-08-17 MED ORDER — SODIUM CHLORIDE 0.9% FLUSH: 3.0000 mL | INTRAVENOUS | Status: DC | PRN

## 2020-08-17 MED ORDER — FAMOTIDINE IN NACL 20-0.9 MG/50ML-% IV SOLN
INTRAVENOUS | Status: AC
  Filled 2020-08-17: qty 50

## 2020-08-17 MED ORDER — ASPIRIN 81 MG PO TBEC
81.0000 mg | DELAYED_RELEASE_TABLET | Freq: Every day | ORAL | 0 refills | Status: AC
  Filled 2020-08-17: qty 30, 30d supply, fill #0

## 2020-08-17 MED ORDER — VERAPAMIL HCL 2.5 MG/ML IV SOLN
INTRAVENOUS | Status: AC
  Filled 2020-08-17: qty 2

## 2020-08-17 MED ORDER — SODIUM CHLORIDE 0.9 % IV SOLN: INTRAVENOUS | Status: AC

## 2020-08-17 MED ORDER — MIDAZOLAM HCL 2 MG/2ML IJ SOLN
INTRAMUSCULAR | Status: DC | PRN
  Administered 2020-08-17 (×2): 2 mg via INTRAVENOUS

## 2020-08-17 MED ORDER — MIDAZOLAM HCL 2 MG/2ML IJ SOLN
INTRAMUSCULAR | Status: DC | PRN
  Administered 2020-08-17: 2 mg via INTRAVENOUS

## 2020-08-17 MED ORDER — SODIUM CHLORIDE 0.9 % IV SOLN
250.0000 mL | INTRAVENOUS | Status: DC | PRN
Start: 2020-08-17 — End: 2020-08-18

## 2020-08-17 MED ORDER — SODIUM CHLORIDE 0.9 % IV SOLN: 250.0000 mL | INTRAVENOUS | Status: DC | PRN

## 2020-08-17 MED ORDER — FENTANYL CITRATE (PF) 100 MCG/2ML IJ SOLN
INTRAMUSCULAR | Status: AC
  Filled 2020-08-17: qty 2

## 2020-08-17 MED ORDER — SODIUM CHLORIDE 0.9 % IV SOLN: INTRAVENOUS | Status: DC

## 2020-08-17 MED ORDER — SODIUM CHLORIDE 0.9% FLUSH
3.0000 mL | INTRAVENOUS | Status: DC | PRN
Start: 2020-08-17 — End: 2020-08-18

## 2020-08-17 MED ORDER — FENTANYL CITRATE (PF) 100 MCG/2ML IJ SOLN
INTRAMUSCULAR | Status: DC | PRN
  Administered 2020-08-17: 50 ug via INTRAVENOUS

## 2020-08-17 MED ORDER — SODIUM CHLORIDE 0.9% FLUSH: 3.0000 mL | Freq: Two times a day (BID) | INTRAVENOUS | Status: DC

## 2020-08-17 MED ORDER — CLOPIDOGREL BISULFATE 300 MG PO TABS
ORAL_TABLET | ORAL | Status: AC
  Filled 2020-08-17: qty 2

## 2020-08-17 MED ORDER — NITROGLYCERIN 0.4 MG SL SUBL
0.4000 mg | SUBLINGUAL_TABLET | SUBLINGUAL | 0 refills | Status: DC | PRN
  Filled 2020-08-17: qty 25, 1d supply, fill #0

## 2020-08-17 MED ORDER — FAMOTIDINE IN NACL 20-0.9 MG/50ML-% IV SOLN
INTRAVENOUS | Status: AC | PRN
  Administered 2020-08-17: 20 mg via INTRAVENOUS

## 2020-08-17 MED ORDER — ONDANSETRON HCL 4 MG/2ML IJ SOLN: 4.0000 mg | Freq: Four times a day (QID) | INTRAMUSCULAR | Status: DC | PRN

## 2020-08-17 MED ORDER — ACETAMINOPHEN 325 MG PO TABS
650.0000 mg | ORAL_TABLET | ORAL | Status: DC | PRN
  Administered 2020-08-17: 650 mg via ORAL
  Filled 2020-08-17: qty 2

## 2020-08-17 MED ORDER — VERAPAMIL HCL 2.5 MG/ML IV SOLN
INTRAVENOUS | Status: DC | PRN
  Administered 2020-08-17: 10 mL via INTRA_ARTERIAL

## 2020-08-17 MED ORDER — ASPIRIN 81 MG PO CHEW
81.0000 mg | CHEWABLE_TABLET | ORAL | Status: AC
  Administered 2020-08-17: 81 mg via ORAL
  Filled 2020-08-17: qty 1

## 2020-08-17 MED ORDER — IOHEXOL 350 MG/ML SOLN
INTRAVENOUS | Status: DC | PRN
  Administered 2020-08-17: 65 mL

## 2020-08-17 MED ORDER — ROSUVASTATIN CALCIUM 40 MG PO TABS
40.0000 mg | ORAL_TABLET | Freq: Every day | ORAL | 11 refills | Status: DC
  Filled 2020-08-17: qty 30, 30d supply, fill #0

## 2020-08-17 MED ORDER — HEPARIN SODIUM (PORCINE) 1000 UNIT/ML IJ SOLN
INTRAMUSCULAR | Status: DC | PRN
  Administered 2020-08-17: 6000 [IU] via INTRAVENOUS

## 2020-08-17 MED ORDER — NITROGLYCERIN 1 MG/10 ML FOR IR/CATH LAB
INTRA_ARTERIAL | Status: AC
  Filled 2020-08-17: qty 10

## 2020-08-17 SURGICAL SUPPLY — 21 items
BALLN SAPPHIRE 2.5X15 (BALLOONS) ×2
BALLN ~~LOC~~ EMERGE MR 4.0X15 (BALLOONS) ×2
BALLOON SAPPHIRE 2.5X15 (BALLOONS) ×1 IMPLANT
BALLOON ~~LOC~~ EMERGE MR 4.0X15 (BALLOONS) ×1 IMPLANT
CATH 5FR JL3.5 JR4 ANG PIG MP (CATHETERS) ×2 IMPLANT
CATH INFINITI JR4 5F (CATHETERS) ×2 IMPLANT
CATH INFINITI MULTIPACK ANG 4F (CATHETERS) ×2 IMPLANT
CATH SWAN GANZ 7F STRAIGHT (CATHETERS) ×2 IMPLANT
CATH VISTA GUIDE 6FR XB3 (CATHETERS) ×2 IMPLANT
DEVICE RAD COMP TR BAND LRG (VASCULAR PRODUCTS) ×2 IMPLANT
GLIDESHEATH SLEND SS 6F .021 (SHEATH) ×2 IMPLANT
GLIDESHEATH SLENDER 7FR .021G (SHEATH) ×2 IMPLANT
GUIDEWIRE .025 260CM (WIRE) ×2 IMPLANT
GUIDEWIRE INQWIRE 1.5J.035X260 (WIRE) ×1 IMPLANT
GUIDEWIRE PRESSURE X 175 (WIRE) ×2 IMPLANT
INQWIRE 1.5J .035X260CM (WIRE) ×2
KIT ENCORE 26 ADVANTAGE (KITS) ×2 IMPLANT
PACK CARDIAC CATHETERIZATION (CUSTOM PROCEDURE TRAY) ×2 IMPLANT
STENT RESOLUTE ONYX 4.0X26 (Permanent Stent) ×2 IMPLANT
TRANSDUCER W/STOPCOCK (MISCELLANEOUS) ×2 IMPLANT
WIRE COUGAR XT STRL 190CM (WIRE) ×2 IMPLANT

## 2020-08-17 NOTE — Interval H&P Note (Signed)
History and Physical Interval Note:  08/17/2020 12:39 PM  Alexander [name]  has presented today for surgery, with the diagnosis of heart failure.  The various methods of treatment have been discussed with the patient and family. After consideration of risks, benefits and other options for treatment, the patient has consented to  Procedure(s): RIGHT/LEFT HEART CATH AND CORONARY ANGIOGRAPHY (N/A) and possible coronary angioplasty as a surgical intervention.  The patient's history has been reviewed, patient examined, no change in status, stable for surgery.  I have reviewed the patient's chart and labs.  Questions were answered to the patient's satisfaction.     Alexander [name]

## 2020-08-17 NOTE — Discharge Instructions (Signed)
Radial Site Care  This sheet gives you information about how to care for yourself after your procedure. Your health care provider may also give you more specific instructions. If you have problems or questions, contact your health care provider. What can I expect after the procedure? After the procedure, it is common to have:  Bruising and tenderness at the catheter insertion area. Follow these instructions at home: Medicines  Take over-the-counter and prescription medicines only as told by your health care provider. Insertion site care  Follow instructions from your health care provider about how to take care of your insertion site. Make sure you: ? Wash your hands with soap and water before you change your bandage (dressing). If soap and water are not available, use hand sanitizer. ? Change your dressing as told by your health care provider. ? Leave stitches (sutures), skin glue, or adhesive strips in place. These skin closures may need to stay in place for 2 weeks or longer. If adhesive strip edges start to loosen and curl up, you may trim the loose edges. Do not remove adhesive strips completely unless your health care provider tells you to do that.  Check your insertion site every day for signs of infection. Check for: ? Redness, swelling, or pain. ? Fluid or blood. ? Pus or a bad smell. ? Warmth.  Do not take baths, swim, or use a hot tub until your health care provider approves.  You may shower 24-48 hours after the procedure, or as directed by your health care provider. ? Remove the dressing and gently wash the site with plain soap and water. ? Pat the area dry with a clean towel. ? Do not rub the site. That could cause bleeding.  Do not apply powder or lotion to the site. Activity  For 24 hours after the procedure, or as directed by your health care provider: ? Do not flex or bend the affected arm. ? Do not push or pull heavy objects with the affected arm. ? Do not drive  yourself home from the hospital or clinic. You may drive 24 hours after the procedure unless your health care provider tells you not to. ? Do not operate machinery or power tools.  Do not lift anything that is heavier than 10 lb (4.5 kg), or the limit that you are told, until your health care provider says that it is safe.  Ask your health care provider when it is okay to: ? Return to work or school. ? Resume usual physical activities or sports. ? Resume sexual activity.   General instructions  If the catheter site starts to bleed, raise your arm and put firm pressure on the site. If the bleeding does not stop, get help right away. This is a medical emergency.  If you went home on the same day as your procedure, a responsible adult should be with you for the first 24 hours after you arrive home.  Keep all follow-up visits as told by your health care provider. This is important. Contact a health care provider if:  You have a fever.  You have redness, swelling, or yellow drainage around your insertion site. Get help right away if:  You have unusual pain at the radial site.  The catheter insertion area swells very fast.  The insertion area is bleeding, and the bleeding does not stop when you hold steady pressure on the area.  Your arm or hand becomes pale, cool, tingly, or numb. These symptoms may represent a serious   problem that is an emergency. Do not wait to see if the symptoms will go away. Get medical help right away. Call your local emergency services (911 in the [place] Do not drive yourself to the hospital. Summary  After the procedure, it is common to have bruising and tenderness at the site.  Follow instructions from your health care provider about how to take care of your radial site wound. Check the wound every day for signs of infection.  Do not lift anything that is heavier than 10 lb (4.5 kg), or the limit that you are told, until your health care provider says that it  is safe. This information is not intended to replace advice given to you by your health care provider. Make sure you discuss any questions you have with your health care provider. Document Revised: 05/31/2017 Document Reviewed: 05/31/2017 Elsevier Patient Education  2021 [employer] Resume Xarelto tomorrow if no bleeding from cath site.

## 2020-08-17 NOTE — Progress Notes (Signed)
CARDIAC REHAB PHASE I   Stent education completed with pt. Pt educated on importance of ASA, Plavix, and Xerelto. Pt states he has heart healthy diet and exercise guidelines at home. Reviewed site care and restrictions. Will re-refer to [facility] [place] with knowledge pt already an active member.   [id number] Reynold Bowen, RN BSN 08/17/2020 3:08 PM

## 2020-08-17 NOTE — Discharge Summary (Addendum)
Discharge Summary for Same Day PCI   Patient ID: Alexander [name] MRN: [id number]; DOB: 1975/11/29  Admit date: 08/17/2020 Discharge date: 08/17/2020  Primary Care Provider: Halford Chessman, MD  Primary Cardiologist: Arvilla Meres, MD  Primary Electrophysiologist:  Lanier Prude, MD   Discharge Diagnoses    Principal Problem:   Coronary artery disease involving native coronary artery of native heart without angina pectoris Active Problems:   HLD (hyperlipidemia)   Chronic systolic heart failure [facility])    Diagnostic Studies/Procedures    Cardiac Catheterization 08/17/2020:  Prox Cx lesion is 80% stenosed. Mid LAD lesion is 70% stenosed. A drug-eluting stent was successfully placed using a STENT RESOLUTE ONYX 4.0X26. Post intervention, there is a 0% residual stenosis.   1. Severe proximal Circumflex stenosis 2. Successful PTCA/DES x 1 proximal Circumflex 3. Pressure wire analysis of the mid LAD. RFR is 0.93 suggesting the mid stenosis is not flow limiting.    Recommendations: Will continue DAPT with ASA and Plavix for at least one month. His ASA could be stopped in one month since he will also be on Xarelto. I would continue the Plavix for at least six months.   Diagnostic Dominance: Right    Intervention     _____________   History of Present Illness     Alexander [name] is a 45 y.o. male with PMH of systolic HF, HTN, HLD, adjustment disorder, anxiety and afib. Hospitalization (04/24/20) for rapid afib leading to cardiogenic shock. He had been drinking heavily, developed increased SOB and chest tightness. Presented to [facility], EKG showed A Fib RVR. Started on diltiazem with plans for admit but he left AMA. Discharged on diltiazem + Xarelto.    Presented to [facility] ED on 04/25/20 with HF symptoms. EKG showed A Fib RVR. Admitted and started on diltiazem. Echo showed EF 35-40%. EP consulted. Started on metoprolol and losartan with plans to reassess in 3 weeks for  possible cardioversion.    Sent back to [facility] ED from EP clinic on 05/13/20 with N/V and A fib RVR. On arrival he was cool and volume overloaded.  Started on amio drip + milrinone due to cardiogenic shock. Diuresed with IV lasix and later stopped with addition of GDMT. After amio load had successful cardioversion. Discharged home on po amio with taper. All HF meds provided through TOC.     Post hospital follow up on 2/25 he was feeling well and wanting to enroll in CR. Planned for CR as an outpatient. Had been walking 2-3 miles 4-5x/week, stretching and doing yoga. Denied increasing SOB, CP, dizziness, edema, or PND/Orthopnea. Appetite ok. No fever or chills. Weight at home ~283-288 pounds. Taking all medications. No more ETOH since discharge. Took prn lasix once last week after a salty meal. Had follow up echo which showed an EF of 45-50%. He was set up for [facility] to exclude underlying CAD after being seen in follow up with Dr. Gala Romney.   Hospital Course     The patient underwent cardiac cath with Dr. Gala Romney as noted above with pLAD lesion of 70% and 80% ostial Lcx. Dr. Clifton James preformed PCI/DESx1 to pLcx. Pressure wire analysis of the mLAD noting RFR is 0.93 suggesting mid stenosis is not flow limiting. Plan for DAPT with ASA/plavix for at least one month as patient is also on Xarelto. Will plan to stop ASA after one month and continue plavix and Xarelto. The patient was seen by cardiac rehab while in short stay. There were no observed complications post cath.  Radial cath site was re-evaluated prior to discharge and found to be stable without any complications. Also discussed adding statin at discharge which patient was agreeable to do. Instructions/precautions regarding cath site care were given prior to discharge.  Alexander [name] was seen by Dr. Clifton James and determined stable for discharge home. Follow up with our office has been arranged. Medications are listed below. Pertinent changes include addition  of ASA, plavix and crestor 40mg  daily. Also given SL nitro.   _____________  Cath/PCI Registry Performance & Quality Measures: Aspirin prescribed? - Yes ADP Receptor Inhibitor (Plavix/Clopidogrel, Brilinta/Ticagrelor or Effient/Prasugrel) prescribed (includes medically managed patients)? - Yes High Intensity Statin (Lipitor 40-80mg  or Crestor 20-40mg ) prescribed? - Yes For EF <40%, was ACEI/ARB prescribed? - Yes For EF <40%, Aldosterone Antagonist (Spironolactone or Eplerenone) prescribed? - Yes Cardiac Rehab Phase II ordered (Included Medically managed Patients)? - Yes  _____________   Discharge Vitals Blood pressure 120/84, pulse 63, temperature 98 F (36.7 C), temperature source Oral, resp. rate (!) 21, height 6\' 1"  (1.854 m), weight 131.5 kg, SpO2 96 %.  Filed Weights   08/17/20 0931  Weight: 131.5 kg    Last Labs & Radiologic Studies    CBC No results for input(s): WBC, NEUTROABS, HGB, HCT, MCV, PLT in the last 72 hours. Basic Metabolic Panel No results for input(s): NA, K, CL, CO2, GLUCOSE, BUN, CREATININE, CALCIUM, MG, PHOS in the last 72 hours. Liver Function Tests No results for input(s): AST, ALT, ALKPHOS, BILITOT, PROT, ALBUMIN in the last 72 hours. No results for input(s): LIPASE, AMYLASE in the last 72 hours. High Sensitivity Troponin:   No results for input(s): TROPONINIHS in the last 720 hours.  BNP Invalid input(s): POCBNP D-Dimer No results for input(s): DDIMER in the last 72 hours. Hemoglobin A1C No results for input(s): HGBA1C in the last 72 hours. Fasting Lipid Panel No results for input(s): CHOL, HDL, LDLCALC, TRIG, CHOLHDL, LDLDIRECT in the last 72 hours. Thyroid Function Tests No results for input(s): TSH, T4TOTAL, T3FREE, THYROIDAB in the last 72 hours.  Invalid input(s): FREET3 _____________  CARDIAC CATHETERIZATION  Result Date: 08/17/2020  Prox Cx lesion is 80% stenosed.  Mid LAD lesion is 70% stenosed.  A drug-eluting stent was  successfully placed using a STENT RESOLUTE ONYX 4.0X26.  Post intervention, there is a 0% residual stenosis.  1. Severe proximal Circumflex stenosis 2. Successful PTCA/DES x 1 proximal Circumflex 3. Pressure wire analysis of the mid LAD. RFR is 0.93 suggesting the mid stenosis is not flow limiting. Recommendations: Will continue DAPT with ASA and Plavix for at least one month. His ASA could be stopped in one month since he will also be on Xarelto. I would continue the Plavix for at least six months.   CARDIAC CATHETERIZATION  Result Date: 08/17/2020  Mid RCA lesion is 40% stenosed.  Prox LAD to Mid LAD lesion is 70% stenosed.  Ost Cx to Prox Cx lesion is 80% stenosed.  Findings: 1. 3v CAD 2. LAD 70% mid 3. LCx 80-90% prox 4. RCA 40% mid 5. EF 40% LVEDP 23mm Hg Assessment: Will plan PCI of LCX and flow wire interrogation of LAD. 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 LVEDP only 4. 10/17/2020, MD 1:33 PM   ECHOCARDIOGRAM COMPLETE  Result Date: 08/03/2020    ECHOCARDIOGRAM REPORT   Patient Name:   Alexander [name] Date of Exam: 08/03/2020 Medical Rec #:  [name]     Height:       72.0 in Accession #:  [id number]    Weight:       288.0 lb Date of Birth:  24-Oct-1975     BSA:          2.488 m Patient Age:    45 years      BP:           124/94 mmHg Patient Gender: M             HR:           68 bpm. Exam Location:  Outpatient Procedure: 2D Echo, Cardiac Doppler and Color Doppler Indications:    Congestive Heart Failure I50.9  History:        Patient has prior history of Echocardiogram examinations, most                 recent 05/14/2020. CHF, Arrythmias:Atrial Fibrillation; Risk                 Factors:Non-Smoker, Hypertension and Dyslipidemia.  Sonographer:    Renella Cunas RDCS Referring Phys: [id number] Roxy Horseman SIMMONS IMPRESSIONS  1. Left ventricular ejection fraction, by estimation, is 45 to 50%. The left ventricle has mildly decreased function. The left ventricle demonstrates global hypokinesis. The  left ventricular internal cavity size was mildly dilated. Left ventricular diastolic parameters are consistent with Grade I diastolic dysfunction (impaired relaxation).  2. Right ventricular systolic function is normal. The right ventricular size is normal.  3. Left atrial size was mildly dilated.  4. The mitral valve is normal in structure. No evidence of mitral valve regurgitation. No evidence of mitral stenosis.  5. The aortic valve is normal in structure. Aortic valve regurgitation is not visualized. No aortic stenosis is present.  6. The inferior vena cava is normal in size with greater than 50% respiratory variability, suggesting right atrial pressure of 3 mmHg. FINDINGS  Left Ventricle: Left ventricular ejection fraction, by estimation, is 45 to 50%. The left ventricle has mildly decreased function. The left ventricle demonstrates global hypokinesis. The left ventricular internal cavity size was mildly dilated. There is  no left ventricular hypertrophy. Left ventricular diastolic parameters are consistent with Grade I diastolic dysfunction (impaired relaxation). Right Ventricle: The right ventricular size is normal. No increase in right ventricular wall thickness. Right ventricular systolic function is normal. Left Atrium: Left atrial size was mildly dilated. Right Atrium: Right atrial size was normal in size. Pericardium: There is no evidence of pericardial effusion. Mitral Valve: The mitral valve is normal in structure. No evidence of mitral valve regurgitation. No evidence of mitral valve stenosis. Tricuspid Valve: The tricuspid valve is normal in structure. Tricuspid valve regurgitation is trivial. No evidence of tricuspid stenosis. Aortic Valve: The aortic valve is normal in structure. Aortic valve regurgitation is not visualized. No aortic stenosis is present. Pulmonic Valve: The pulmonic valve was normal in structure. Pulmonic valve regurgitation is trivial. No evidence of pulmonic stenosis. Aorta: The  aortic root is normal in size and structure. Venous: The inferior vena cava is normal in size with greater than 50% respiratory variability, suggesting right atrial pressure of 3 mmHg. IAS/Shunts: No atrial level shunt detected by color flow Doppler.  LEFT VENTRICLE PLAX 2D LVIDd:         5.60 cm      Diastology LVIDs:         3.80 cm      LV e' medial:    3.89 cm/s LV PW:         1.10 cm  LV E/e' medial:  8.1 LV IVS:        1.10 cm      LV e' lateral:   6.24 cm/s LVOT diam:     2.40 cm      LV E/e' lateral: 5.0 LV SV:         48 LV SV Index:   19 LVOT Area:     4.52 cm  LV Volumes (MOD) LV vol d, MOD A2C: 146.0 ml LV vol d, MOD A4C: 238.0 ml LV vol s, MOD A2C: 76.7 ml LV vol s, MOD A4C: 133.0 ml LV SV MOD A2C:     69.3 ml LV SV MOD A4C:     238.0 ml LV SV MOD BP:      93.8 ml RIGHT VENTRICLE RV S prime:     12.90 cm/s TAPSE (M-mode): 1.6 cm LEFT ATRIUM           Index       RIGHT ATRIUM           Index LA diam:      4.00 cm 1.61 cm/m  RA Area:     12.20 cm LA Vol (A2C): 28.2 ml 11.34 ml/m RA Volume:   26.70 ml  10.73 ml/m LA Vol (A4C): 24.5 ml 9.85 ml/m  AORTIC VALVE LVOT Vmax:   57.90 cm/s LVOT Vmean:  38.500 cm/s LVOT VTI:    0.105 m  AORTA Ao Root diam: 3.80 cm Ao Asc diam:  3.70 cm MITRAL VALVE MV Area (PHT): 2.00 cm    SHUNTS MV Decel Time: 380 msec    Systemic VTI:  0.10 m MV E velocity: 31.50 cm/s  Systemic Diam: 2.40 cm MV A velocity: 27.60 cm/s MV E/A ratio:  1.14 Arvilla Meres MD Electronically signed by Arvilla Meres MD Signature Date/Time: 08/03/2020/11:49:11 AM    Final    Disposition   Pt is being discharged home today in good condition.  Follow-up Plans & Appointments     Follow-up Information     Bensimhon, Bevelyn Buckles, MD Follow up.   Specialty: Cardiology Why: Office will call you with a follow up appt Contact information: [address] [place] [address] [phone]                Discharge Instructions     Amb Referral to Cardiac  Rehabilitation   Complete by: As directed    Diagnosis: Coronary Stents   After initial evaluation and assessments completed: Virtual Based Care may be provided alone or in conjunction with Phase 2 Cardiac Rehab based on patient barriers.: Yes        Discharge Medications   Allergies as of 08/17/2020       Reactions   Albuterol Anaphylaxis   Bee Venom Anaphylaxis   Metaproterenol Anaphylaxis   Other Anaphylaxis   DEET   Amoxicillin-pot Clavulanate Itching   OK with Amoxicillin   Cephalexin Other (See Comments)   Chest tightness   Amoxicillin Itching, Rash   Itching all over         Medication List     TAKE these medications    acetaminophen 500 MG tablet Commonly known as: TYLENOL Take 500 mg by mouth every 6 (six) hours as needed for moderate pain.   allopurinol 100 MG tablet Commonly known as: ZYLOPRIM Take 100 mg by mouth daily.   amiodarone 200 MG tablet Commonly known as: PACERONE Take 200 mg by mouth daily.   aspirin EC 81 MG tablet  Take 1 tablet (81 mg total) by mouth daily. Swallow whole.   buPROPion 300 MG 24 hr tablet Commonly known as: WELLBUTRIN XL Take 300 mg by mouth daily.   carvedilol 6.25 MG tablet Commonly known as: COREG Take 1 tablet (6.25 mg total) by mouth 2 (two) times daily with a meal.   clonazePAM 0.5 MG tablet Commonly known as: KLONOPIN Take 0.5 mg by mouth 2 (two) times daily as needed for anxiety.   clopidogrel 75 MG tablet Commonly known as: Plavix Take 1 tablet (75 mg total) by mouth daily.   colchicine 0.6 MG tablet Take 0.6 mg by mouth 2 (two) times daily as needed (gout).   dapagliflozin propanediol 10 MG Tabs tablet Commonly known as: FARXIGA Take 1 tablet (10 mg total) by mouth daily.   fenofibrate 145 MG tablet Commonly known as: TRICOR Take 145 mg by mouth daily.   furosemide 20 MG tablet Commonly known as: Lasix Take 1 tablet (20 mg total) by mouth as needed for fluid or edema.   multivitamin with  minerals Tabs tablet Take 1 tablet by mouth daily.   nitroGLYCERIN 0.4 MG SL tablet Commonly known as: Nitrostat Place 1 tablet (0.4 mg total) under the tongue every 5 (five) minutes as needed for chest pain.   potassium chloride SA 20 MEQ tablet Commonly known as: KLOR-CON Take 1 tablet (20 mEq total) by mouth as needed (with Lasix).   rosuvastatin 40 MG tablet Commonly known as: Crestor Take 1 tablet (40 mg total) by mouth daily.   sacubitril-valsartan 97-103 MG Commonly known as: ENTRESTO Take 1 tablet by mouth 2 (two) times daily.   spironolactone 25 MG tablet Commonly known as: ALDACTONE Take 1 tablet (25 mg total) by mouth daily.   traMADol 50 MG tablet Commonly known as: ULTRAM Take 50 mg by mouth 3 (three) times daily as needed for moderate pain.   Xarelto 20 MG Tabs tablet Generic drug: rivaroxaban Take 1 tablet (20 mg total) by mouth daily.        Allergies Allergies  Allergen Reactions   Albuterol Anaphylaxis   Bee Venom Anaphylaxis   Metaproterenol Anaphylaxis   Other Anaphylaxis    DEET   Amoxicillin-Pot Clavulanate Itching    OK with Amoxicillin   Cephalexin Other (See Comments)    Chest tightness   Amoxicillin Itching and Rash    Itching all over      Outstanding Labs/Studies   LFTs/FLP at outpatient follow up with addition of statin  Duration of Discharge Encounter   Greater than 30 minutes including physician time.  Signed, Laverda PageLindsay Roberts, NP 08/17/2020, 4:17 PM

## 2020-08-18 ENCOUNTER — Encounter (HOSPITAL_COMMUNITY): Payer: Self-pay | Admitting: Internal Medicine

## 2020-08-18 MED FILL — Clopidogrel Bisulfate Tab 300 MG (Base Equiv): ORAL | Qty: 2 | Status: AC

## 2020-08-18 MED FILL — Nitroglycerin IV Soln 100 MCG/ML in D5W: INTRA_ARTERIAL | Qty: 10 | Status: AC

## 2020-08-19 ENCOUNTER — Encounter (HOSPITAL_COMMUNITY): Payer: BC Managed Care – PPO

## 2020-08-21 ENCOUNTER — Encounter (HOSPITAL_COMMUNITY): Payer: BC Managed Care – PPO

## 2020-08-24 ENCOUNTER — Encounter (HOSPITAL_BASED_OUTPATIENT_CLINIC_OR_DEPARTMENT_OTHER): Payer: BC Managed Care – PPO | Admitting: Cardiology

## 2020-08-24 ENCOUNTER — Encounter (HOSPITAL_COMMUNITY): Payer: BC Managed Care – PPO

## 2020-08-26 ENCOUNTER — Encounter (HOSPITAL_COMMUNITY): Payer: BC Managed Care – PPO

## 2020-08-28 ENCOUNTER — Encounter (HOSPITAL_COMMUNITY): Payer: BC Managed Care – PPO

## 2020-08-31 ENCOUNTER — Encounter (HOSPITAL_COMMUNITY): Payer: BC Managed Care – PPO

## 2020-08-31 ENCOUNTER — Telehealth (HOSPITAL_COMMUNITY): Payer: Self-pay | Admitting: *Deleted

## 2020-09-01 ENCOUNTER — Encounter (HOSPITAL_COMMUNITY): Payer: Self-pay | Admitting: *Deleted

## 2020-09-01 DIAGNOSIS — I5041 Acute combined systolic (congestive) and diastolic (congestive) heart failure: Secondary | ICD-10-CM

## 2020-09-01 DIAGNOSIS — I5042 Chronic combined systolic (congestive) and diastolic (congestive) heart failure: Secondary | ICD-10-CM

## 2020-09-01 NOTE — Progress Notes (Signed)
Cardiac Individual Treatment Plan  Patient Details  Name: Alexander [name] MRN: [id number] Date of Birth: May 24, 1975 Referring Provider:   Flowsheet Row CARDIAC REHAB PHASE II ORIENTATION from 07/16/2020 in [place]  Referring Provider Bensimhon, Daniel MD      Initial Encounter Date:  [place] from 07/16/2020 in [place]  Date 07/16/20      Visit Diagnosis: Heart failure, systolic and diastolic, chronic ([place])  Acute combined systolic and diastolic HF (heart failure) ([place])  Patient's Home Medications on Admission:  Current Outpatient Medications:  .  acetaminophen (TYLENOL) 500 MG tablet, Take 500 mg by mouth every 6 (six) hours as needed for moderate pain., Disp: , Rfl:  .  allopurinol (ZYLOPRIM) 100 MG tablet, Take 100 mg by mouth daily., Disp: , Rfl:  .  amiodarone (PACERONE) 200 MG tablet, Take 200 mg by mouth daily., Disp: , Rfl:  .  aspirin 81 MG EC tablet, Take 1 tablet (81 mg total) by mouth daily., Disp: 30 tablet, Rfl: 0 .  buPROPion (WELLBUTRIN XL) 300 MG 24 hr tablet, Take 300 mg by mouth daily., Disp: , Rfl:  .  carvedilol (COREG) 6.25 MG tablet, Take 1 tablet (6.25 mg total) by mouth 2 (two) times daily with a meal., Disp: 60 tablet, Rfl: 3 .  clonazePAM (KLONOPIN) 0.5 MG tablet, Take 0.5 mg by mouth 2 (two) times daily as needed for anxiety., Disp: , Rfl:  .  clopidogrel (PLAVIX) 75 MG tablet, Take 1 tablet (75 mg total) by mouth daily., Disp: 90 tablet, Rfl: 3 .  colchicine 0.6 MG tablet, Take 0.6 mg by mouth 2 (two) times daily as needed (gout)., Disp: , Rfl:  .  dapagliflozin propanediol (FARXIGA) 10 MG TABS tablet, Take 1 tablet (10 mg total) by mouth daily., Disp: 30 tablet, Rfl: 6 .  fenofibrate (TRICOR) 145 MG tablet, Take 145 mg by mouth daily., Disp: , Rfl:  .  furosemide (LASIX) 20 MG tablet, Take 1 tablet (20 mg total) by mouth as needed for fluid or  edema., Disp: 30 tablet, Rfl: 11 .  Multiple Vitamin (MULTIVITAMIN WITH MINERALS) TABS tablet, Take 1 tablet by mouth daily., Disp: , Rfl:  .  nitroGLYCERIN (NITROSTAT) 0.4 MG SL tablet, Place 1 tablet (0.4 mg total) under the tongue every 5 (five) minutes as needed for chest pain., Disp: 25 tablet, Rfl: 0 .  potassium chloride SA (KLOR-CON) 20 MEQ tablet, Take 1 tablet (20 mEq total) by mouth as needed (with Lasix)., Disp: 90 tablet, Rfl: 3 .  rosuvastatin (CRESTOR) 40 MG tablet, Take 1 tablet (40 mg total) by mouth daily., Disp: 30 tablet, Rfl: 11 .  sacubitril-valsartan (ENTRESTO) 97-103 MG, Take 1 tablet by mouth 2 (two) times daily., Disp: 60 tablet, Rfl: 6 .  spironolactone (ALDACTONE) 25 MG tablet, Take 1 tablet (25 mg total) by mouth daily., Disp: 30 tablet, Rfl: 6 .  traMADol (ULTRAM) 50 MG tablet, Take 50 mg by mouth 3 (three) times daily as needed for moderate pain., Disp: , Rfl:  .  XARELTO 20 MG TABS tablet, Take 1 tablet (20 mg total) by mouth daily., Disp: 30 tablet, Rfl: 3  Past Medical History: Past Medical History:  Diagnosis Date  . Arrhythmia   . Atrial fibrillation ([place])   . CHF (congestive heart failure) ([place])   . Gout   . Hyperlipidemia   . Hypertension     Tobacco Use: Social History  Tobacco Use  Smoking Status Never Smoker  Smokeless Tobacco Never Used    Labs: Recent Review Flowsheet Data    Labs for ITP Cardiac and Pulmonary Rehab Latest Ref Rng & Units 05/16/2020 05/16/2020 05/17/2020 05/18/2020 08/17/2020   Hemoglobin A1c 4.8 - 5.6 % - - 6.0(H) - -   PHART 7.350 - 7.450 - - - - 7.400   PCO2ART 32.0 - 48.0 mmHg - - - - 38.4   HCO3 20.0 - 28.0 mmol/L - - - - 23.8   TCO2 22 - 32 mmol/L - - - - 25   ACIDBASEDEF 0.0 - 2.0 mmol/L - - - - 1.0   O2SAT % 47.0 58.6 55.2 59.6 99.0      Capillary Blood Glucose: No results found for: GLUCAP   Exercise Target Goals: Exercise Program Goal: Individual exercise prescription set using results from initial 6 min  walk test and THRR while considering  patient's activity barriers and safety.   Exercise Prescription Goal: Initial exercise prescription builds to 30-45 minutes a day of aerobic activity, 2-3 days per week.  Home exercise guidelines will be given to patient during program as part of exercise prescription that the participant will acknowledge.  Activity Barriers & Risk Stratification:  Activity Barriers & Cardiac Risk Stratification - 07/16/20 1611      Activity Barriers & Cardiac Risk Stratification   Activity Barriers Arthritis;Joint Problems   Arthritis: bilateral ankles, low back, fingers. Joint problems: left shoulder weakness.   Cardiac Risk Stratification High   Below 5 MET's on walk test          6 Minute Walk:  6 Minute Walk    Row Name 07/16/20 1607         6 Minute Walk   Phase Initial     Distance 1694 feet     Walk Time 6 minutes     # of Rest Breaks 0     MPH 3.21     METS 4.29     RPE 11     Perceived Dyspnea  0     VO2 Peak 15.03     Symptoms No     Resting HR 65 bpm     Resting BP 124/94     Resting Oxygen Saturation  96 %     Exercise Oxygen Saturation  during 6 min walk 97 %     Max Ex. HR 86 bpm     Max Ex. BP 142/94     2 Minute Post BP 138/94            Oxygen Initial Assessment:   Oxygen Re-Evaluation:   Oxygen Discharge (Final Oxygen Re-Evaluation):   Initial Exercise Prescription:  Initial Exercise Prescription - 07/16/20 1600      Date of Initial Exercise RX and Referring Provider   Date 07/16/20    Referring Provider Glori Bickers MD    Expected Discharge Date 09/11/20      Recumbant Bike   Level 2    RPM 60    Minutes 15    METs 2.5      Arm Ergometer   Level 1.5    RPM 60    Minutes 15    METs 2      Prescription Details   Frequency (times per week) 3    Duration Progress to 30 minutes of continuous aerobic without signs/symptoms of physical distress      Intensity   THRR 40-80% of Max Heartrate 71-142  Ratings of Perceived Exertion 11-13    Perceived Dyspnea 0-4      Progression   Progression Continue progressive overload as per policy without signs/symptoms or physical distress.      Resistance Training   Training Prescription Yes    Weight 5    Reps 10-15           Perform Capillary Blood Glucose checks as needed.  Exercise Prescription Changes:  Exercise Prescription Changes    Row Name 07/20/20 1127 07/27/20 1124 07/29/20 1118 08/14/20 1112       Response to Exercise   Blood Pressure (Admit) 130/90 132/90 148/90 138/92    Blood Pressure (Exercise) 128/90 138/92 142/92 142/92    Blood Pressure (Exit) 122/88 142/94 120/90 130/80    Heart Rate (Admit) 81 bpm 86 bpm 80 bpm 75 bpm    Heart Rate (Exercise) 87 bpm 99 bpm 107 bpm 97 bpm    Heart Rate (Exit) 74 bpm 85 bpm 81 bpm 75 bpm    Rating of Perceived Exertion (Exercise) _0 Symptoms none none none none    Comments Off to a great start with exercise. -- -- --    Duration Continue with 30 min of aerobic exercise without signs/symptoms of physical distress. Continue with 30 min of aerobic exercise without signs/symptoms of physical distress. Continue with 30 min of aerobic exercise without signs/symptoms of physical distress. Continue with 30 min of aerobic exercise without signs/symptoms of physical distress.    Intensity THRR unchanged THRR unchanged THRR unchanged THRR unchanged         Progression   Progression Continue to progress workloads to maintain intensity without signs/symptoms of physical distress. Continue to progress workloads to maintain intensity without signs/symptoms of physical distress. Continue to progress workloads to maintain intensity without signs/symptoms of physical distress. Continue to progress workloads to maintain intensity without signs/symptoms of physical distress.    Average METs 2.8 3.6 3.8 3.9         Resistance Training   Training Prescription Yes No No Yes    Weight 5 --  -- 8 lbs    Reps 10-15 -- -- 10-15    Time 10 Minutes -- -- 10 Minutes         Interval Training   Interval Training No No No No         Treadmill   MPH -- 3.2 3.2 3.2    Grade -- _1 Minutes -- _2 METs -- 4.33 4.33 4.33         Recumbant Bike   Level 3 -- -- --    Minutes 15 -- -- --    METs 2.8 -- -- --         NuStep   Level -- _3 SPM -- 85 85 85    Minutes -- _4 METs -- 2.8 3.2 3.4         Arm Ergometer   Level 3 -- -- --    Watts 7 -- -- --    Minutes 15 -- -- --         Home Exercise Plan   Plans to continue exercise at [employer] (comment) 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 Add 4 additional days to program exercise sessions. Add 4  additional days to program exercise sessions. Add 4 additional days to program exercise sessions.    Initial Home Exercises Provided -- -- 07/29/20 07/29/20           Exercise Comments:  Exercise Comments    Row Name 07/20/20 1222 07/22/20 1200 07/29/20 1200 09/01/20 0842     Exercise Comments Patient tolerated first session of exercise very well without symptoms. Patient feels the arm ergometer is too light for him, and we discussed changing his exercise equipment at cardiac rehab. Patient will switch from the recumbent bike and arm ergometer to the treadmill and recumbent step machine. Reviewed home exercise guidelines, METs, and goals with patient. Patient had stent placed on 08/17/20. Will resume exercise on 09/02/20.           Exercise Goals and Review:  Exercise Goals    Row Name 07/16/20 1624             Exercise Goals   Increase Physical Activity Yes       Intervention Provide advice, education, support and counseling about physical activity/exercise needs.;Develop an individualized exercise prescription for aerobic and resistive training based on initial  evaluation findings, risk stratification, comorbidities and participant's personal goals.       Expected Outcomes Short Term: Attend rehab on a regular basis to increase amount of physical activity.;Long Term: Add in home exercise to make exercise part of routine and to increase amount of physical activity.;Long Term: Exercising regularly at least 3-5 days a week.       Increase Strength and Stamina Yes       Intervention Provide advice, education, support and counseling about physical activity/exercise needs.;Develop an individualized exercise prescription for aerobic and resistive training based on initial evaluation findings, risk stratification, comorbidities and participant's personal goals.       Expected Outcomes Short Term: Increase workloads from initial exercise prescription for resistance, speed, and METs.;Short Term: Perform resistance training exercises routinely during rehab and add in resistance training at home;Long Term: Improve cardiorespiratory fitness, muscular endurance and strength as measured by increased METs and functional capacity (6MWT)       Able to understand and use rate of perceived exertion (RPE) scale Yes       Intervention Provide education and explanation on how to use RPE scale       Expected Outcomes Short Term: Able to use RPE daily in rehab to express subjective intensity level;Long Term:  Able to use RPE to guide intensity level when exercising independently       Knowledge and understanding of Target Heart Rate Range (THRR) Yes       Intervention Provide education and explanation of THRR including how the numbers were predicted and where they are located for reference       Expected Outcomes Short Term: Able to state/look up THRR;Long Term: Able to use THRR to govern intensity when exercising independently;Short Term: Able to use daily as guideline for intensity in rehab       Understanding of Exercise Prescription Yes       Intervention Provide education,  explanation, and written materials on patient's individual exercise prescription       Expected Outcomes Short Term: Able to explain program exercise prescription;Long Term: Able to explain home exercise prescription to exercise independently              Exercise Goals Re-Evaluation :  Exercise Goals Re-Evaluation    Row Name 07/20/20 1436 07/29/20 1200 09/01/20 0842           Exercise Goal Re-Evaluation   Exercise Goals Review Increase Physical Activity;Able to understand and use rate of perceived exertion (RPE) scale;Able to check pulse independently Increase Physical Activity;Able to understand and use rate of perceived exertion (RPE) scale;Able to check pulse independently;Increase Strength and Stamina;Knowledge and understanding of Target Heart Rate Range (THRR);Understanding of Exercise Prescription Increase Physical Activity;Able to understand and use rate of perceived exertion (RPE) scale;Able to check pulse independently;Increase Strength and Stamina;Knowledge and understanding of Target Heart Rate Range (THRR);Understanding of Exercise Prescription     Comments Patient able to use RPE scale appropriately. Reviewed THRR and MET level with patient. Patient exercises at the gym 4-5 days/ week. Patient exercises aerobically 30 minutes and uses up to 20 lb weights for his resistance training. Patient has a smart watch to check his pulse. Patient is making excellent progress with exercise. Patient is exercising aerobically at least 30 minutes, 3-4 days/week. Patient's goal is to live a healthy life and lose weight. Patient's goal weight is 220 lbs. Patient has a smart watch to monitor his heart rate. Patient has been out since 08/14/20, unable to review METs and goals. Patient had stent placed on 08/17/20 and was on medical hold, then out of town. Patient plans to return to cardiac rehab on 09/02/20.     Expected Outcomes Progress workloads as tolerated to help achieve personal health and fitness  goals. Patient will continue his current exericse routine and progress workloads as tolerated to help achieve his personal health and fitness goals. Review goals when patient returns to cardiac rehab.            Discharge Exercise Prescription (Final Exercise Prescription Changes):  Exercise Prescription Changes - 08/14/20 1112      Response to Exercise   Blood Pressure (Admit) 138/92    Blood Pressure (Exercise) 142/92    Blood Pressure (Exit) 130/80    Heart Rate (Admit) 75 bpm    Heart Rate (Exercise) 97 bpm    Heart Rate (Exit) 75 bpm    Rating of Perceived Exertion (Exercise) 11    Symptoms none    Duration Continue with 30 min of aerobic exercise without signs/symptoms of physical distress.    Intensity THRR unchanged      Progression   Progression Continue to progress workloads to maintain intensity without signs/symptoms of physical distress.    Average METs 3.9      Resistance Training   Training Prescription Yes    Weight 8 lbs    Reps 10-15    Time 10 Minutes      Interval Training   Interval Training No      Treadmill   MPH 3.2    Grade 2    Minutes 15    METs 4.33      NuStep   Level 5    SPM 85    Minutes 15    METs 3.4      Home Exercise Plan   Plans to continue exercise at [employer] (comment)    Frequency Add 4 additional days to program exercise sessions.    Initial Home Exercises Provided 07/29/20           Nutrition:  Target Goals: Understanding of nutrition guidelines, daily intake of sodium <1562m, cholesterol <2053m calories 30% from fat and 7% or less from saturated fats, daily to have 5 or more servings of fruits and vegetables.  Biometrics:  Pre Biometrics - 07/16/20 1605      Pre Biometrics  Waist Circumference 52.25 inches    Hip Circumference 48 inches    Waist to Hip Ratio 1.09 %    Triceps Skinfold 24 mm    % Body Fat 38.2 %    Grip Strength 58 kg    Flexibility 2 in    Single Leg Stand 15 seconds             Nutrition Therapy Plan and Nutrition Goals:  Nutrition Therapy & Goals - 07/31/20 1211      Nutrition Therapy   Diet TLC; low sodium      Personal Nutrition Goals   Nutrition Goal Pt to identify and limit food sources of saturated fat, trans fat, refined carbohydrates and sodium    Personal Goal #2 Pt to identify food quantities necessary to achieve weight loss of 6-24 lb at graduation from cardiac rehab.      Intervention Plan   Intervention Prescribe, educate and counsel regarding individualized specific dietary modifications aiming towards targeted core components such as weight, hypertension, lipid management, diabetes, heart failure and other comorbidities.    Expected Outcomes Short Term Goal: A plan has been developed with personal nutrition goals set during dietitian appointment.;Long Term Goal: Adherence to prescribed nutrition plan.           Nutrition Assessments:  MEDIFICTS Score Key:  ?70 Need to make dietary changes   40-70 Heart Healthy Diet  ? 40 Therapeutic Level Cholesterol Diet   Flowsheet Row CARDIAC REHAB PHASE II EXERCISE from 07/31/2020 in  [facility] C[facility]  Picture Your Plate Total Score on Admission 66     Picture Your Plate Scores:  <40 Unhealthy dietary pattern with much room for improvement.  41-50 Dietary pattern unlikely to meet recommendations for good health and room for improvement.  51-60 More healthful dietary pattern, with some room for improvement.   >60 Healthy dietary pattern, although there may be some specific behaviors that could be improved.    Nutrition Goals Re-Evaluation:  Nutrition Goals Re-Evaluation    Row Name 07/31/20 1211 08/28/20 1342           Goals   Current Weight 288 lb (130.6 kg) 290 lb 9.1 oz (131.8 kg)      Nutrition Goal -- Pt to identify and limit food sources of saturated fat, trans fat, refined carbohydrates and sodium      Expected Outcome Weight loss and sodium  restriction Weight loss and sodium restriction             Personal Goal #2 Re-Evaluation   Personal Goal #2 -- Pt to identify food quantities necessary to achieve weight loss of 6-24 lb at graduation from cardiac rehab.             Nutrition Goals Re-Evaluation:  Nutrition Goals Re-Evaluation    Row Name 07/31/20 1211 08/28/20 1342           Goals   Current Weight 288 lb (130.6 kg) 290 lb 9.1 oz (131.8 kg)      Nutrition Goal -- Pt to identify and limit food sources of saturated fat, trans fat, refined carbohydrates and sodium      Expected Outcome Weight loss and sodium restriction Weight loss and sodium restriction             Personal Goal #2 Re-Evaluation   Personal Goal #2 -- Pt to identify food quantities necessary to achieve weight loss of 6-24 lb at graduation from cardiac rehab.               Nutrition Goals Discharge (Final Nutrition Goals Re-Evaluation):  Nutrition Goals Re-Evaluation - 08/28/20 1342      Goals   Current Weight 290 lb 9.1 oz (131.8 kg)    Nutrition Goal Pt to identify and limit food sources of saturated fat, trans fat, refined carbohydrates and sodium    Expected Outcome Weight loss and sodium restriction      Personal Goal #2 Re-Evaluation   Personal Goal #2 Pt to identify food quantities necessary to achieve weight loss of 6-24 lb at graduation from cardiac rehab.           Psychosocial: Target Goals: Acknowledge presence or absence of significant depression and/or stress, maximize coping skills, provide positive support system. Participant is able to verbalize types and ability to use techniques and skills needed for reducing stress and depression.  Initial Review & Psychosocial Screening:  Initial Psych Review & Screening - 07/17/20 0837      Initial Review   Current issues with History of Depression;Current Anxiety/Panic      Family Dynamics   Good Support System? Yes   Nickolus is divorced. Deveron has his ex wife and friends for  support   Comments Derryck has anxiety adjustment disorder      Barriers   Psychosocial barriers to participate in program The patient should benefit from training in stress management and relaxation.      Screening Interventions   Interventions To provide support and resources with identified psychosocial needs;Encouraged to exercise;Provide feedback about the scores to participant    Expected Outcomes Long Term Goal: Stressors or current issues are controlled or eliminated.;Short Term goal: Identification and review with participant of any Quality of Life or Depression concerns found by scoring the questionnaire.           Quality of Life Scores:  Quality of Life - 07/16/20 1628      Quality of Life   Select Quality of Life      Quality of Life Scores   Health/Function Pre 22.77 %    Socioeconomic Pre 27.43 %    Psych/Spiritual Pre 19.17 %    Family Pre 24.3 %    GLOBAL Pre 23.33 %          Scores of 19 and below usually indicate a poorer quality of life in these areas.  A difference of  2-3 points is a clinically meaningful difference.  A difference of 2-3 points in the total score of the Quality of Life Index has been associated with significant improvement in overall quality of life, self-image, physical symptoms, and general health in studies assessing change in quality of life.  PHQ-9: Recent Review Flowsheet Data    Depression screen [facility] 2/9 07/17/2020 02/24/2020   Decreased Interest 0 0   Down, Depressed, Hopeless 0 0   PHQ - 2 Score 0 0     Interpretation of Total Score  Total Score Depression Severity:  1-4 = Minimal depression, 5-9 = Mild depression, 10-14 = Moderate depression, 15-19 = Moderately severe depression, 20-27 = Severe depression   Psychosocial Evaluation and Intervention:   Psychosocial Re-Evaluation:  Psychosocial Re-Evaluation    Row Name 08/04/20 0810 09/01/20 0808           Psychosocial Re-Evaluation   Current issues with Current  Anxiety/Panic;History of Depression Current Anxiety/Panic;History of Depression      Comments Warren has not voiced any increased concerns or stressors Unable to assess patient has not exercised since 08/14/20      Expected  Outcomes Sina will have decreased stress upon completion of phase 2 cardiac rehab Bhavesh will have decreased stress upon completion of phase 2 cardiac rehab      Interventions Encouraged to attend Cardiac Rehabilitation for the exercise Encouraged to attend Cardiac Rehabilitation for the exercise      Continue Psychosocial Services  No Follow up required No Follow up required             Psychosocial Discharge (Final Psychosocial Re-Evaluation):  Psychosocial Re-Evaluation - 09/01/20 0808      Psychosocial Re-Evaluation   Current issues with Current Anxiety/Panic;History of Depression    Comments Unable to assess patient has not exercised since 08/14/20    Expected Outcomes Ahron will have decreased stress upon completion of phase 2 cardiac rehab    Interventions Encouraged to attend Cardiac Rehabilitation for the exercise    Continue Psychosocial Services  No Follow up required           Vocational Rehabilitation: Provide vocational rehab assistance to qualifying candidates.   Vocational Rehab Evaluation & Intervention:  Vocational Rehab - 07/17/20 0841      Initial Vocational Rehab Evaluation & Intervention   Assessment shows need for Vocational Rehabilitation No   Renzo is working full time and does not need vocational rehab at this time          Education: Education Goals: Education classes will be provided on a weekly basis, covering required topics. Participant will state understanding/return demonstration of topics presented.  Learning Barriers/Preferences:  Learning Barriers/Preferences - 07/16/20 1634      Learning Barriers/Preferences   Learning Barriers None    Learning Preferences Individual Instruction;Computer/Internet            Education Topics: Count Your Pulse:  -Group instruction provided by verbal instruction, demonstration, patient participation and written materials to support subject.  Instructors address importance of being able to find your pulse and how to count your pulse when at home without a heart monitor.  Patients get hands on experience counting their pulse with staff help and individually.   Heart Attack, Angina, and Risk Factor Modification:  -Group instruction provided by verbal instruction, video, and written materials to support subject.  Instructors address signs and symptoms of angina and heart attacks.    Also discuss risk factors for heart disease and how to make changes to improve heart health risk factors.   Functional Fitness:  -Group instruction provided by verbal instruction, demonstration, patient participation, and written materials to support subject.  Instructors address safety measures for doing things around the house.  Discuss how to get up and down off the floor, how to pick things up properly, how to safely get out of a chair without assistance, and balance training.   Meditation and Mindfulness:  -Group instruction provided by verbal instruction, patient participation, and written materials to support subject.  Instructor addresses importance of mindfulness and meditation practice to help reduce stress and improve awareness.  Instructor also leads participants through a meditation exercise.    Stretching for Flexibility and Mobility:  -Group instruction provided by verbal instruction, patient participation, and written materials to support subject.  Instructors lead participants through series of stretches that are designed to increase flexibility thus improving mobility.  These stretches are additional exercise for major muscle groups that are typically performed during regular warm up and cool down.   Hands Only CPR:  -Group verbal, video, and participation provides a  basic overview of AHA guidelines for community CPR. Role-play of   emergencies allow participants the opportunity to practice calling for help and chest compression technique with discussion of AED use.   Hypertension: -Group verbal and written instruction that provides a basic overview of hypertension including the most recent diagnostic guidelines, risk factor reduction with self-care instructions and medication management.    Nutrition I class: Heart Healthy Eating:  -Group instruction provided by PowerPoint slides, verbal discussion, and written materials to support subject matter. The instructor gives an explanation and review of the Therapeutic Lifestyle Changes diet recommendations, which includes a discussion on lipid goals, dietary fat, sodium, fiber, plant stanol/sterol esters, sugar, and the components of a well-balanced, healthy diet.   Nutrition II class: Lifestyle Skills:  -Group instruction provided by PowerPoint slides, verbal discussion, and written materials to support subject matter. The instructor gives an explanation and review of label reading, grocery shopping for heart health, heart healthy recipe modifications, and ways to make healthier choices when eating out.   Diabetes Question & Answer:  -Group instruction provided by PowerPoint slides, verbal discussion, and written materials to support subject matter. The instructor gives an explanation and review of diabetes co-morbidities, pre- and post-prandial blood glucose goals, pre-exercise blood glucose goals, signs, symptoms, and treatment of hypoglycemia and hyperglycemia, and foot care basics.   Diabetes Blitz:  -Group instruction provided by PowerPoint slides, verbal discussion, and written materials to support subject matter. The instructor gives an explanation and review of the physiology behind type 1 and type 2 diabetes, diabetes medications and rational behind using different medications, pre- and post-prandial  blood glucose recommendations and Hemoglobin A1c goals, diabetes diet, and exercise including blood glucose guidelines for exercising safely.    Portion Distortion:  -Group instruction provided by PowerPoint slides, verbal discussion, written materials, and food models to support subject matter. The instructor gives an explanation of serving size versus portion size, changes in portions sizes over the last 20 years, and what consists of a serving from each food group.   Stress Management:  -Group instruction provided by verbal instruction, video, and written materials to support subject matter.  Instructors review role of stress in heart disease and how to cope with stress positively.     Exercising on Your Own:  -Group instruction provided by verbal instruction, power point, and written materials to support subject.  Instructors discuss benefits of exercise, components of exercise, frequency and intensity of exercise, and end points for exercise.  Also discuss use of nitroglycerin and activating EMS.  Review options of places to exercise outside of rehab.  Review guidelines for sex with heart disease.   Cardiac Drugs I:  -Group instruction provided by verbal instruction and written materials to support subject.  Instructor reviews cardiac drug classes: antiplatelets, anticoagulants, beta blockers, and statins.  Instructor discusses reasons, side effects, and lifestyle considerations for each drug class.   Cardiac Drugs II:  -Group instruction provided by verbal instruction and written materials to support subject.  Instructor reviews cardiac drug classes: angiotensin converting enzyme inhibitors (ACE-I), angiotensin II receptor blockers (ARBs), nitrates, and calcium channel blockers.  Instructor discusses reasons, side effects, and lifestyle considerations for each drug class.   Anatomy and Physiology of the Circulatory System:  Group verbal and written instruction and models provide basic  cardiac anatomy and physiology, with the coronary electrical and arterial systems. Review of: AMI, Angina, Valve disease, Heart Failure, Peripheral Artery Disease, Cardiac Arrhythmia, Pacemakers, and the ICD.   Other Education:  -Group or individual verbal, written, or video instructions that support the educational   goals of the cardiac rehab program.   Holiday Eating Survival Tips:  -Group instruction provided by PowerPoint slides, verbal discussion, and written materials to support subject matter. The instructor gives patients tips, tricks, and techniques to help them not only survive but enjoy the holidays despite the onslaught of food that accompanies the holidays.   Knowledge Questionnaire Score:  Knowledge Questionnaire Score - 07/16/20 1633      Knowledge Questionnaire Score   Pre Score 21/24           Core Components/Risk Factors/Patient Goals at Admission:  Personal Goals and Risk Factors at Admission - 07/16/20 1635      Core Components/Risk Factors/Patient Goals on Admission    Weight Management Yes;Weight Loss    Intervention Weight Management: Develop a combined nutrition and exercise program designed to reach desired caloric intake, while maintaining appropriate intake of nutrient and fiber, sodium and fats, and appropriate energy expenditure required for the weight goal.;Weight Management: Provide education and appropriate resources to help participant work on and attain dietary goals.;Weight Management/Obesity: Establish reasonable short term and long term weight goals.;Obesity: Provide education and appropriate resources to help participant work on and attain dietary goals.    Heart Failure Yes    Intervention Provide a combined exercise and nutrition program that is supplemented with education, support and counseling about heart failure. Directed toward relieving symptoms such as shortness of breath, decreased exercise tolerance, and extremity edema.    Expected  Outcomes Improve functional capacity of life;Short term: Attendance in program 2-3 days a week with increased exercise capacity. Reported lower sodium intake. Reported increased fruit and vegetable intake. Reports medication compliance.;Short term: Daily weights obtained and reported for increase. Utilizing diuretic protocols set by physician.;Long term: Adoption of self-care skills and reduction of barriers for early signs and symptoms recognition and intervention leading to self-care maintenance.    Hypertension Yes    Intervention Provide education on lifestyle modifcations including regular physical activity/exercise, weight management, moderate sodium restriction and increased consumption of fresh fruit, vegetables, and low fat dairy, alcohol moderation, and smoking cessation.;Monitor prescription use compliance.    Expected Outcomes Short Term: Continued assessment and intervention until BP is < 140/26m HG in hypertensive participants. < 130/879mHG in hypertensive participants with diabetes, heart failure or chronic kidney disease.;Long Term: Maintenance of blood pressure at goal levels.    Lipids Yes    Intervention Provide education and support for participant on nutrition & aerobic/resistive exercise along with prescribed medications to achieve LDL <7073mHDL >22m24m  Expected Outcomes Short Term: Participant states understanding of desired cholesterol values and is compliant with medications prescribed. Participant is following exercise prescription and nutrition guidelines.;Long Term: Cholesterol controlled with medications as prescribed, with individualized exercise RX and with personalized nutrition plan. Value goals: LDL < 70mg56mL > 40 mg.    Stress Yes    Intervention Offer individual and/or small group education and counseling on adjustment to heart disease, stress management and health-related lifestyle change. Teach and support self-help strategies.;Refer participants experiencing  significant psychosocial distress to appropriate mental health specialists for further evaluation and treatment. When possible, include family members and significant others in education/counseling sessions.    Expected Outcomes Short Term: Participant demonstrates changes in health-related behavior, relaxation and other stress management skills, ability to obtain effective social support, and compliance with psychotropic medications if prescribed.;Long Term: Emotional wellbeing is indicated by absence of clinically significant psychosocial distress or social isolation.    Personal Goal Other Yes  Personal Goal Pt wants to form sustaining healthy habits that he can maintain through life. Pt wants to lose 60 lbs over time    Intervention Will work with pt to create resonable steps to help achive his goals.    Expected Outcomes Will help pt assess goals through education and monitoring.           Core Components/Risk Factors/Patient Goals Review:   Goals and Risk Factor Review    Row Name 07/20/20 1600 08/04/20 1157 09/01/20 0809         Core Components/Risk Factors/Patient Goals Review   Personal Goals Review Weight Management/Obesity;Heart Failure;Stress;Hypertension;Lipids Weight Management/Obesity;Heart Failure;Stress;Hypertension;Lipids Weight Management/Obesity;Heart Failure;Stress;Hypertension;Lipids     Review Saman started exercise on 07/20/20 Did well Jeramie continues to have some systolic BP's in the 26'O. Will continue to monitor Mattia has been doing well with exercise. Less's diastolic BP's continue to run in the 90's. Exercise flow sheets with BP's were forwarded to Dr Bensimhon's office for review. Ric exercise is currently on hold post PCI/ Stent. Patient would like to extend participation in the program.     Expected Outcomes Shaylon will continue to participate in phase 2 cardiac rehab for exercise, nutrition and lifestyle modifications Asante will continue to participate  in phase 2 cardiac rehab for exercise, nutrition and lifestyle modifications Leovardo will continue to participate in phase 2 cardiac rehab for exercise, nutrition and lifestyle modifications upon return to exercise.            Core Components/Risk Factors/Patient Goals at Discharge (Final Review):   Goals and Risk Factor Review - 09/01/20 0809      Core Components/Risk Factors/Patient Goals Review   Personal Goals Review Weight Management/Obesity;Heart Failure;Stress;Hypertension;Lipids    Review Jawuan exercise is currently on hold post PCI/ Stent. Patient would like to extend participation in the program.    Expected Outcomes Tionne will continue to participate in phase 2 cardiac rehab for exercise, nutrition and lifestyle modifications upon return to exercise.           ITP Comments:  ITP Comments    Row Name 07/16/20 1433 07/20/20 1559 08/04/20 0809 09/01/20 0804     ITP Comments Dr Fransico Him MD, Medical Director 30 day ITP Review. Asbury started exercise on 07/20/20 and did well with exercise. 30 day ITP Review. 30 Day ITP Review. Braxen has good attendance and participation in phase 2 cardiac rehab 30 day ITP Review. Langley is S/P DES CFX 08/17/20. Exercise is currently on hold pending post procedure follow up           Comments: See ITP Comments

## 2020-09-02 ENCOUNTER — Ambulatory Visit: Payer: Self-pay

## 2020-09-02 ENCOUNTER — Ambulatory Visit (INDEPENDENT_AMBULATORY_CARE_PROVIDER_SITE_OTHER): Payer: BC Managed Care – PPO | Admitting: Orthopaedic Surgery

## 2020-09-02 ENCOUNTER — Encounter (HOSPITAL_COMMUNITY): Payer: BC Managed Care – PPO

## 2020-09-02 ENCOUNTER — Encounter: Payer: Self-pay | Admitting: Orthopaedic Surgery

## 2020-09-02 DIAGNOSIS — M79644 Pain in right finger(s): Secondary | ICD-10-CM

## 2020-09-02 MED ORDER — LIDOCAINE HCL 1 % IJ SOLN
0.3000 mL | INTRAMUSCULAR | Status: AC | PRN
  Administered 2020-09-02: .3 mL

## 2020-09-02 MED ORDER — BUPIVACAINE HCL 0.25 % IJ SOLN
0.3300 mL | INTRAMUSCULAR | Status: AC | PRN
  Administered 2020-09-02: .33 mL

## 2020-09-02 MED ORDER — METHYLPREDNISOLONE ACETATE 40 MG/ML IJ SUSP
13.3300 mg | INTRAMUSCULAR | Status: AC | PRN
  Administered 2020-09-02: 13.33 mg

## 2020-09-02 NOTE — Progress Notes (Signed)
Office Visit Note   Patient: Alexander [name]           Date of Birth: 10/28/1975           MRN: [id number] Visit Date: 09/02/2020              Requested by: Halford Chessman, MD No address on file PCP: Halford Chessman, MD   Assessment & Plan: Visit Diagnoses:  1. Pain in right finger(s)     Plan: Impression is right long finger cyst dorsal aspect of the DIP joint.  We have discussed aspiration/injection versus excision.  The patient was recently hospitalized within the past few months for A. fib and is currently on Xarelto and Eliquis.  He is much as he would like to have this excised, he does not feel as though he will be cleared as he has also had a recent catheterization.  He will follow-up with [place] as needed.  Follow-Up Instructions: Return if symptoms worsen or fail to improve.   Orders:  Orders Placed This Encounter  Procedures  . Hand/UE Inj  . XR Finger Middle Right   No orders of the defined types were placed in this encounter.     Procedures: Hand/UE Inj: R long DIP for osteoarthritis on 09/02/2020 9:10 AM Medications: 0.3 mL lidocaine 1 %; 0.33 mL bupivacaine 0.25 %; 13.33 mg methylPREDNISolone acetate 40 MG/ML      Clinical Data: No additional findings.   Subjective: Chief Complaint  Patient presents with  . Right Middle Finger - Pain    HPI patient is a pleasant 45 year old right-hand-dominant gentleman who comes in today with concerns about a cyst to the dorsum of the right long finger DIP joint.  He initially noticed this about 3 to 4 months ago.  This became painful about a month ago and over this past weekend while in a pool he notes that it ruptured.  The only pain he has is when he is applying pressure to the area.  No fevers or chills.  No paresthesias.  He does note a previous cyst to the side of his face several years ago.  Review of Systems as detailed in HPI.  All others reviewed are negative.   Objective: Vital Signs: There were no  vitals taken for this visit.  Physical Exam well-developed well-nourished gentleman in no acute distress.  Alert oriented x3.  Ortho Exam examination of his right long finger reveals a pea-sized cystic formation to the dorsum of the DIP joint.  This is slightly erythematous.  No active drainage.  Mildly tender.  He is able to flex the DIP joint without pain.  He is neurovascularly intact distally.  Specialty Comments:  No specialty comments available.  Imaging: XR Finger Middle Right  Result Date: 09/02/2020 X-rays demonstrate a small dorsal osteophyte to the DIP joint of the long finger    PMFS History: Patient Active Problem List   Diagnosis Date Noted  . Chronic systolic heart failure (HCC)   . Coronary artery disease involving native coronary artery of native heart without angina pectoris   . Acute systolic heart failure (HCC) 05/13/2020  . Acute on chronic systolic (congestive) heart failure (HCC) 05/13/2020  . Atrial fibrillation with RVR (HCC) 04/26/2020  . HLD (hyperlipidemia) 04/26/2020  . HTN (hypertension) 04/26/2020  . Depression 04/26/2020  . Gout 04/26/2020  . Panic attacks 04/26/2020  . Low back pain 04/26/2020   Past Medical History:  Diagnosis Date  . Arrhythmia   . Atrial fibrillation (HCC)   .  CHF (congestive heart failure) (HCC)   . Gout   . Hyperlipidemia   . Hypertension     Family History  Problem Relation Age of Onset  . COPD Mother     Past Surgical History:  Procedure Laterality Date  . CARDIOVERSION N/A 05/15/2020   Procedure: CARDIOVERSION;  Surgeon: Dolores Patty, MD;  Location: [facility] ENDOSCOPY;  Service: Cardiovascular;  Laterality: N/A;  . CORONARY STENT INTERVENTION N/A 08/17/2020   Procedure: CORONARY STENT INTERVENTION;  Surgeon: Kathleene Hazel, MD;  Location: [facility] INVASIVE CV LAB;  Service: Cardiovascular;  Laterality: N/A;  . RIGHT/LEFT HEART CATH AND CORONARY ANGIOGRAPHY N/A 08/17/2020   Procedure: RIGHT/LEFT HEART CATH AND  CORONARY ANGIOGRAPHY;  Surgeon: Dolores Patty, MD;  Location: [facility] INVASIVE CV LAB;  Service: Cardiovascular;  Laterality: N/A;  . SEPTOPLASTY     Social History   Occupational History  . Not on file  Tobacco Use  . Smoking status: Never Smoker  . Smokeless tobacco: Never Used  Substance and Sexual Activity  . Alcohol use: Not Currently    Comment: stopped drinking in December 2021  . Drug use: Not Currently  . Sexual activity: Not on file

## 2020-09-04 ENCOUNTER — Encounter (HOSPITAL_COMMUNITY): Payer: Self-pay

## 2020-09-04 ENCOUNTER — Encounter (HOSPITAL_COMMUNITY): Payer: BC Managed Care – PPO

## 2020-09-04 ENCOUNTER — Other Ambulatory Visit (HOSPITAL_COMMUNITY): Payer: Self-pay | Admitting: *Deleted

## 2020-09-04 MED ORDER — PREDNISONE 20 MG PO TABS: ORAL_TABLET | ORAL | 0 refills | Status: DC

## 2020-09-06 DIAGNOSIS — G4733 Obstructive sleep apnea (adult) (pediatric): Secondary | ICD-10-CM

## 2020-09-06 HISTORY — DX: Obstructive sleep apnea (adult) (pediatric): G47.33

## 2020-09-07 ENCOUNTER — Encounter (HOSPITAL_COMMUNITY)
Admission: RE | Admit: 2020-09-07 | Discharge: 2020-09-07 | Disposition: A | Discharge status: Home / Self Care | Payer: BC Managed Care – PPO | Source: Ambulatory Visit | Attending: Internal Medicine | Admitting: Internal Medicine

## 2020-09-07 ENCOUNTER — Other Ambulatory Visit: Payer: Self-pay

## 2020-09-07 DIAGNOSIS — I5041 Acute combined systolic (congestive) and diastolic (congestive) heart failure: Secondary | ICD-10-CM | POA: Insufficient documentation

## 2020-09-07 DIAGNOSIS — I5042 Chronic combined systolic (congestive) and diastolic (congestive) heart failure: Secondary | ICD-10-CM | POA: Insufficient documentation

## 2020-09-07 NOTE — Progress Notes (Signed)
Incomplete Session Note  Patient Details  Name: Alexander [name] MRN: [id number] Date of Birth: November 08, 1975 Referring Provider:   Flowsheet Row CARDIAC REHAB PHASE II ORIENTATION from 07/16/2020 in [facility] CARDIAC [facility]  Referring Provider Bensimhon, Reuel Boom MD      Alexander [name] did not complete his rehab session.  Alexander [name] returned to exercise today has not been seen post PCI. No complaints. The heart failure clinic was called and notified. Spoke with Porter. Appointment made for Monday at 10:30. Will extend Alexander [name]'s exercise time post procedure.Gladstone Lighter, RN,BSN 09/07/2020 11:17 AM

## 2020-09-08 ENCOUNTER — Other Ambulatory Visit: Payer: Self-pay

## 2020-09-08 ENCOUNTER — Ambulatory Visit (HOSPITAL_BASED_OUTPATIENT_CLINIC_OR_DEPARTMENT_OTHER): Payer: BC Managed Care – PPO | Attending: Cardiology | Admitting: Cardiology

## 2020-09-08 DIAGNOSIS — G4733 Obstructive sleep apnea (adult) (pediatric): Secondary | ICD-10-CM

## 2020-09-09 ENCOUNTER — Encounter (HOSPITAL_COMMUNITY): Payer: BC Managed Care – PPO

## 2020-09-11 ENCOUNTER — Encounter (HOSPITAL_COMMUNITY): Payer: BC Managed Care – PPO

## 2020-09-12 NOTE — Procedures (Signed)
Patient Name: Alexander [name], Alexander [name] Date: 09/08/2020 Gender: Male D.O.B: 1975-07-17 Age (years): 45 Referring Provider: Lyda Jester Height (inches): 84 Interpreting Physician: Fransico Him MD, ABSM Weight (lbs): 290 RPSGT: Carolin Coy BMI: 38 MRN: [id number] Neck Size: 19.00  CLINICAL INFORMATION Sleep Study Type: Split Night CPAP  Indication for sleep study: Congestive Heart Failure, Excessive Daytime Sleepiness, Fatigue, Hypertension, Obesity, OSA, Snoring  Epworth Sleepiness Score: 4  SLEEP STUDY TECHNIQUE As per the AASM Manual for the Scoring of Sleep and Associated Events v2.3 (April 2016) with a hypopnea requiring 4% desaturations.  The channels recorded and monitored were frontal, central and occipital EEG, electrooculogram (EOG), submentalis EMG (chin), nasal and oral airflow, thoracic and abdominal wall motion, anterior tibialis EMG, snore microphone, electrocardiogram, and pulse oximetry. Continuous positive airway pressure (CPAP) was initiated when the patient met split night criteria and was titrated according to treat sleep-disordered breathing.  MEDICATIONS Medications self-administered by patient taken the night of the study : ENTRESTO, XARELTO, COREG  RESPIRATORY PARAMETERS Diagnostic Total AHI (/hr): 20.6  RDI (/hr):27.0  OA Index (/hr): 1.4  CA Index (/hr): 0.0 REM AHI (/hr): 11.4  NREM AHI (/hr):21.4  Supine AHI (/hr):17.1  Non-supine AHI (/hr):20.7 Min O2 Sat (%):86.0  Mean O2 (%): 92.3  Time below 88% (min):7.2   Titration Optimal Pressure (cm):16  AHI at Optimal Pressure (/hr):3.2  Min O2 at Optimal Pressure (%):92.0 Supine % at Optimal (%):5  Sleep % at Optimal (%):73   SLEEP ARCHITECTURE The recording time for the entire night was 378.9 minutes.  During a baseline period of 185.6 minutes, the patient slept for 131.1 minutes in REM and nonREM, yielding a sleep efficiency of 70.7%. Sleep onset after lights out was 30.5 minutes  with a REM latency of 67.5 minutes. The patient spent 6.5% of the night in stage N1 sleep, 85.5% in stage N2 sleep, 0.0% in stage N3 and 8% in REM.  During the titration period of 187.3 minutes, the patient slept for 143.0 minutes in REM and nonREM, yielding a sleep efficiency of 76.3%. Sleep onset after CPAP initiation was 21.3 minutes with a REM latency of 42.0 minutes. The patient spent 7.3% of the night in stage N1 sleep, 63.3% in stage N2 sleep, 0.0% in stage N3 and 29.4% in REM.  CARDIAC DATA The 2 lead EKG demonstrated sinus rhythm. The mean heart rate was 100.0 beats per minute. Other EKG findings include: None.  LEG MOVEMENT DATA The total Periodic Limb Movements of Sleep (PLMS) were 0. The PLMS index was 0.0 .  IMPRESSIONS - Moderate obstructive sleep apnea occurred during the diagnostic portion of the study(AHI = 20.6/hour). An optimal PAP pressure was selected for this patient ( 16 cm of water) - Mild oxygen desaturation was noted during the diagnostic portion of the study (Min O2 = 86.0%). - The patient snored with moderate snoring volume during the diagnostic portion of the study. - No cardiac abnormalities were noted during this study. - Clinically significant periodic limb movements did not occur during sleep.  DIAGNOSIS - Obstructive Sleep Apnea (G47.33)  RECOMMENDATIONS - Trial of CPAP therapy on 16 cm H2O with a Medium size Resmed Full Face Mask AirFit F30 mask and heated humidification. - Avoid alcohol, sedatives and other CNS depressants that may worsen sleep apnea and disrupt normal sleep architecture. - Sleep hygiene should be reviewed to assess factors that may improve sleep quality. - Weight management and regular exercise should be initiated or continued. - Return to Sleep Center for  re-evaluation after 6 weeks of therapy  [Electronically signed] 09/12/2020 11:41 PM  Fransico Him MD, ABSM Diplomate, American Board of Sleep Medicine

## 2020-09-14 ENCOUNTER — Encounter (HOSPITAL_COMMUNITY)
Admission: RE | Admit: 2020-09-14 | Discharge: 2020-09-14 | Disposition: A | Discharge status: Home / Self Care | Payer: BC Managed Care – PPO | Source: Ambulatory Visit | Attending: Internal Medicine | Admitting: Internal Medicine

## 2020-09-14 ENCOUNTER — Other Ambulatory Visit: Payer: Self-pay

## 2020-09-14 ENCOUNTER — Ambulatory Visit (HOSPITAL_COMMUNITY)
Admission: RE | Admit: 2020-09-14 | Discharge: 2020-09-14 | Disposition: A | Discharge status: Home / Self Care | Payer: BC Managed Care – PPO | Source: Ambulatory Visit | Attending: Cardiology | Admitting: Cardiology

## 2020-09-14 ENCOUNTER — Telehealth: Payer: Self-pay | Admitting: *Deleted

## 2020-09-14 ENCOUNTER — Encounter (HOSPITAL_COMMUNITY): Payer: Self-pay

## 2020-09-14 VITALS — BP 140/80 | HR 60 | Wt 290.6 lb

## 2020-09-14 DIAGNOSIS — G4733 Obstructive sleep apnea (adult) (pediatric): Secondary | ICD-10-CM | POA: Insufficient documentation

## 2020-09-14 DIAGNOSIS — I5022 Chronic systolic (congestive) heart failure: Secondary | ICD-10-CM | POA: Diagnosis present

## 2020-09-14 DIAGNOSIS — Z9861 Coronary angioplasty status: Secondary | ICD-10-CM | POA: Diagnosis not present

## 2020-09-14 DIAGNOSIS — I5042 Chronic combined systolic (congestive) and diastolic (congestive) heart failure: Secondary | ICD-10-CM

## 2020-09-14 DIAGNOSIS — Z7902 Long term (current) use of antithrombotics/antiplatelets: Secondary | ICD-10-CM | POA: Insufficient documentation

## 2020-09-14 DIAGNOSIS — I480 Paroxysmal atrial fibrillation: Secondary | ICD-10-CM | POA: Diagnosis not present

## 2020-09-14 DIAGNOSIS — Z79899 Other long term (current) drug therapy: Secondary | ICD-10-CM | POA: Insufficient documentation

## 2020-09-14 DIAGNOSIS — Z6838 Body mass index (BMI) 38.0-38.9, adult: Secondary | ICD-10-CM | POA: Diagnosis not present

## 2020-09-14 DIAGNOSIS — I5041 Acute combined systolic (congestive) and diastolic (congestive) heart failure: Secondary | ICD-10-CM

## 2020-09-14 DIAGNOSIS — I2510 Atherosclerotic heart disease of native coronary artery without angina pectoris: Secondary | ICD-10-CM | POA: Diagnosis not present

## 2020-09-14 DIAGNOSIS — E785 Hyperlipidemia, unspecified: Secondary | ICD-10-CM | POA: Insufficient documentation

## 2020-09-14 DIAGNOSIS — Z7982 Long term (current) use of aspirin: Secondary | ICD-10-CM | POA: Insufficient documentation

## 2020-09-14 DIAGNOSIS — I110 Hypertensive heart disease with heart failure: Secondary | ICD-10-CM | POA: Insufficient documentation

## 2020-09-14 DIAGNOSIS — Z7901 Long term (current) use of anticoagulants: Secondary | ICD-10-CM | POA: Diagnosis not present

## 2020-09-14 DIAGNOSIS — Z880 Allergy status to penicillin: Secondary | ICD-10-CM | POA: Diagnosis not present

## 2020-09-14 DIAGNOSIS — Z955 Presence of coronary angioplasty implant and graft: Secondary | ICD-10-CM | POA: Diagnosis not present

## 2020-09-14 DIAGNOSIS — Z881 Allergy status to other antibiotic agents status: Secondary | ICD-10-CM | POA: Diagnosis not present

## 2020-09-14 DIAGNOSIS — E669 Obesity, unspecified: Secondary | ICD-10-CM | POA: Insufficient documentation

## 2020-09-14 HISTORY — DX: Chronic systolic (congestive) heart failure: I50.22

## 2020-09-14 NOTE — Patient Instructions (Signed)
Pt was seen in clinic today by PA Simmons. Pt cleared to begin Cardiac Rehab  Please call office at [phone] option 2 if you have any questions or concerns.   Your physician recommends that you schedule a follow-up appointment in: keep your next scheduled appointment with Dr Gala Romney.  At the [facility], you and your health needs are our priority. As part of our continuing mission to provide you with exceptional heart care, we have created designated Provider Care Teams. These Care Teams include your primary Cardiologist (physician) and Advanced Practice Providers (APPs- Physician Assistants and Nurse Practitioners) who all work together to provide you with the care you need, when you need it.   You may see any of the following providers on your designated Care Team at your next follow up: Marland Kitchen Dr Arvilla Meres . Dr Marca Ancona . Dr Thornell Mule . Tonye Becket, NP . Robbie Lis, PA . Shanda Bumps [place],NP . Karle Plumber, PharmD   Please be sure to bring in all your medications bottles to every appointment.

## 2020-09-14 NOTE — Telephone Encounter (Signed)
-----   Message from Quintella Reichert, MD sent at 09/12/2020 11:54 PM EDT ----- Please let patient know that they have sleep apnea with successful PAP titration.  PAP ordered placed in Epic.  Followup in 6 weeks after starting PAP therapy

## 2020-09-14 NOTE — Telephone Encounter (Signed)
The patient has been notified of the result and verbalized understanding.  All questions (if any) were answered.   DME selection is [facility] Patient understands he will be contacted by [facility] to set up his cpap. Patient understands to call if [facility] does not contact him with new setup in a timely manner. Patient understands they will be called once confirmation has been received from [facility] that they have received their new machine to schedule 10 week follow up appointment.   [facility] notified of new cpap order  Please add to airview Patient was grateful for the call and thanked me

## 2020-09-14 NOTE — Progress Notes (Signed)
Advanced Heart Failure Clinic Note   PCP: Molli Barrows, MD [facility]: Dr. Haroldine Laws  EP: Dr. Quentin Ore   Reason for Visit: F/u for chronic systolic heart failure, PAF and CAD s/p PCI   HPI:  Alexander [name] is a 45 y.o. year old with systolic heart failure (presumed tachy-mediated), obesity, HTN, hyperlipidemia, adjustment disorder, anxiety, and A fib.  Presented to [facility] on 04/24/20 with palpitations and SOB in setting of heavy ETOH use EKG showed A Fib RVR. Started on diltiazem with plans for admit but he left AMA. Discharged on diltiazem + Xarelto.   Presented to [facility] ED on 12/18/21with HF symptoms. EKG showed A Fib RVR. Admitted and started on diltiazem. Echo showed EF 35-40%.EPconsulted. Started on metoprolol and losartan with plans to reassess in 3 weeks for possible cardioversion.   Sent back to [facility] ED from EP clinic on 05/13/20 with N/V and A fib RVR. On arrival he was cool and volume overloaed.  Started on amio drip + milrinone due to cardiogenic shock. Echo EF 10-15%. Diuresed with IV lasix and later stopped with addition of GDMT. After amio load had successful cardioversion. Discharged home on po amio with taper. All HF meds provided through TOC.    Had f/u 08/03/20 and was doing well. Echo was repeated, EF 45-50% RV ok. He was referred for [facility] on 08/17/20. The patient underwent cardiac cath with Dr. Haroldine Laws as and found to have  pLAD lesion of 70% and 80% ostial Lcx. Dr. Angelena Form preformed PCI/DESx1 to pLcx. Pressure wire analysis of the mLAD noting RFR is 0.93 suggesting mid LAD stenosis is not flow limiting. Plan for DAPT with ASA/plavix for at least one month as patient is also on Xarelto. Will plan to stop ASA after one month and continue plavix and Xarelto. 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 LVEDP only 4.    Since last visit, he also had sleep study c/w OSA. Dr. Radford Pax following. Awaiting CPAP.   He presents to clinic today for f/u. Doing  well. Denies CP, dyspnea, palpitations, no orthopnea/PND or LEE. He is in NSR on EKG today. Reports full med compliance. Denies abnormal bleeding. NYHA Class II. Wt stable at 290 lb c/w prior clinic readings. He was seen by his PCP last week and labs were done. I have personally reviewed BMP and CBC. Labs stable/ WNL.   Echo 12/21: LVEF 35-40%, RV ok  Echo 3/22: LVEF 45-50%, RV ok   LHC 08/17/20  Conclusion    Prox Cx lesion is 80% stenosed.  Mid LAD lesion is 70% stenosed.  A drug-eluting stent was successfully placed using a STENT RESOLUTE ONYX 4.0X26.  Post intervention, there is a 0% residual stenosis.   1. Severe proximal Circumflex stenosis 2. Successful PTCA/DES x 1 proximal Circumflex 3. Pressure wire analysis of the mid LAD. RFR is 0.93 suggesting the mid stenosis is not flow limiting.   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 LVEDP only 4.    ROS: All systems reviewed and negative except as per HPI.   Past Medical History:  Diagnosis Date  . Arrhythmia   . Atrial fibrillation ([place])   . CAD (coronary artery disease) 08/17/2020   PCI + DES to pLCx, 70% mLAD negative FFR testing (medical therapy)   . CHF (congestive heart failure) ([place])   . Chronic systolic heart failure ([place])   . Gout   . Hyperlipidemia   . Hypertension   .  OSA (obstructive sleep apnea) 09/2020    Current Outpatient Medications  Medication Sig Dispense Refill  . acetaminophen (TYLENOL) 500 MG tablet Take 500 mg by mouth every 6 (six) hours as needed for moderate pain.    Marland Kitchen allopurinol (ZYLOPRIM) 100 MG tablet Take 100 mg by mouth daily.    Marland Kitchen amiodarone (PACERONE) 200 MG tablet Take 200 mg by mouth daily.    Marland Kitchen aspirin 81 MG EC tablet Take 1 tablet (81 mg total) by mouth daily. 30 tablet 0  . buPROPion (WELLBUTRIN XL) 300 MG 24 hr tablet Take 300 mg by mouth daily.    . carvedilol (COREG) 6.25 MG tablet Take 1 tablet (6.25 mg total) by mouth 2 (two) times daily with a meal. 60 tablet 3   . clonazePAM (KLONOPIN) 0.5 MG tablet Take 0.5 mg by mouth 2 (two) times daily as needed for anxiety.    . clopidogrel (PLAVIX) 75 MG tablet Take 1 tablet (75 mg total) by mouth daily. 90 tablet 3  . colchicine 0.6 MG tablet Take 0.6 mg by mouth 2 (two) times daily as needed (gout).    . dapagliflozin propanediol (FARXIGA) 10 MG TABS tablet Take 1 tablet (10 mg total) by mouth daily. 30 tablet 6  . fenofibrate (TRICOR) 145 MG tablet Take 145 mg by mouth daily.    . furosemide (LASIX) 20 MG tablet Take 1 tablet (20 mg total) by mouth as needed for fluid or edema. 30 tablet 11  . Multiple Vitamin (MULTIVITAMIN WITH MINERALS) TABS tablet Take 1 tablet by mouth daily.    . nitroGLYCERIN (NITROSTAT) 0.4 MG SL tablet Place 1 tablet (0.4 mg total) under the tongue every 5 (five) minutes as needed for chest pain. 25 tablet 0  . potassium chloride SA (KLOR-CON) 20 MEQ tablet Take 1 tablet (20 mEq total) by mouth as needed (with Lasix). 90 tablet 3  . rosuvastatin (CRESTOR) 40 MG tablet Take 1 tablet (40 mg total) by mouth daily. 30 tablet 11  . sacubitril-valsartan (ENTRESTO) 97-103 MG Take 1 tablet by mouth 2 (two) times daily. 60 tablet 6  . spironolactone (ALDACTONE) 25 MG tablet Take 1 tablet (25 mg total) by mouth daily. 30 tablet 6  . traMADol (ULTRAM) 50 MG tablet Take 50 mg by mouth 3 (three) times daily as needed for moderate pain.    Marland Kitchen XARELTO 20 MG TABS tablet Take 1 tablet (20 mg total) by mouth daily. 30 tablet 3   No current facility-administered medications for this encounter.    Allergies  Allergen Reactions  . Albuterol Anaphylaxis  . Bee Venom Anaphylaxis  . Metaproterenol Anaphylaxis  . Other Anaphylaxis    DEET  . Amoxicillin-Pot Clavulanate Itching    OK with Amoxicillin  . Cephalexin Other (See Comments)    Chest tightness  . Amoxicillin Itching and Rash    Itching all over       Social History   Socioeconomic History  . Marital status: Divorced    Spouse name:  Not on file  . Number of children: 2  . Years of education: 16  . Highest education level: Bachelor's degree (e.g., BA, AB, BS)  Occupational History  . Not on file  Tobacco Use  . Smoking status: Never Smoker  . Smokeless tobacco: Never Used  Substance and Sexual Activity  . Alcohol use: Not Currently    Comment: stopped drinking in December 2021  . Drug use: Not Currently  . Sexual activity: Not on file  Other Topics  Concern  . Not on file  Social History Narrative  . Not on file   Social Determinants of Health   Financial Resource Strain: Not on file  Food Insecurity: Not on file  Transportation Needs: Not on file  Physical Activity: Not on file  Stress: Not on file  Social Connections: Not on file  Intimate Partner Violence: Not on file   Family History  Problem Relation Age of Onset  . COPD Mother    Vitals:   09/14/20 1036  BP: 140/80  Pulse: 60  SpO2: 96%  Weight: 131.8 kg (290 lb 9.6 oz)   Wt Readings from Last 3 Encounters:  09/14/20 131.8 kg (290 lb 9.6 oz)  09/08/20 131.5 kg (290 lb)  08/17/20 131.5 kg (290 lb)   PHYSICAL EXAM: General:  Well appearing. No respiratory difficulty HEENT: normal Neck: supple. no JVD. Carotids 2+ bilat; no bruits. No lymphadenopathy or thyromegaly appreciated. Cor: PMI nondisplaced. Regular rate & rhythm. No rubs, gallops or murmurs. Lungs: clear Abdomen: soft, nontender, nondistended. No hepatosplenomegaly. No bruits or masses. Good bowel sounds. Extremities: no cyanosis, clubbing, rash, edema Neuro: alert & oriented x 3, cranial nerves grossly intact. moves all 4 extremities w/o difficulty. Affect pleasant.   ASSESSMENT & PLAN:  1. Chronic Systolic Heart Failure - Echo 12/21 EF 35-40%, RV mildly reduced  - Admit (1/22) for a/c CHF>>cardiogenic shock in the setting of rapid afib  - Echo 1/22 EF <20%, RV mildly reduced - Echo 08/03/20 EF 45-50% RV ok.  - Suspect primarily tachy mediated given improving EF w/  restoration to NSR. Also possible component of ICM. Recent cath w/ 80% pLCx treated w/ PCI. He has 70% mLAD but not flow limiting by FFR testing.  - Euvolemic on exam today. Wt stable. NYHA Class I- II.  - Continue lasix 20 mg prn.  - Continue Entresto 97-103 mg bid. - Continue Farxiga 10 mg daily.  - Continue spiro 25 mg daily.  - Continue Carvedilol 6.25 mg bid.  - BMP at PCP office reviewed from 5/2. SCr and K stable/ WNL  - refer to cardiac rehab   2. CAD: LHC 08/18/19 70% mid LAD, non flow limiting by FFR anyalsis, 80% pLCx treated w/ PCI + DES. Medical management for LAD stenosis.  - CP free - C/w ASA + Plavix + Xarelto for total of 30 days, can stop ASA after 5/11 - C/w  blocker + statin  - refer to cardiac rehab   3. PAF - Maintaining NSR post DCCV. EKG personally reviewed  - Continue amiodarone 200 mg daily. May be able to stop soon if no recurrence  - Continue Xarelto. No bleeding issues. Check CBC today  - Stressed need to avoid ETOH - Sleep study + for OSA. Awaiting CPAP    5. Obesity Body mass index is 38.34 kg/m. - He has met with a nutritionist and is working on weight loss - now enrolled in CR. Hopefully this will help jumpstart increase in physical activity   6. OSA - + sleep study - planning CPAP, Dr. Radford Pax following   Keep f/u w/ Dr. Haroldine Laws next month   Alexander Jester, PA-C 09/14/20

## 2020-09-15 NOTE — Progress Notes (Signed)
Alexander [name] returned to exercise per Alexander [name] and exercised without difficulty. Will continue to monitor the patient throughout  the program.Alexander [name] Alexander Flor, RN,BSN 09/15/2020 3:18 PM

## 2020-09-16 ENCOUNTER — Encounter (HOSPITAL_COMMUNITY): Payer: BC Managed Care – PPO

## 2020-09-16 ENCOUNTER — Telehealth (HOSPITAL_COMMUNITY): Payer: Self-pay | Admitting: Family Medicine

## 2020-09-17 ENCOUNTER — Encounter (HOSPITAL_COMMUNITY): Payer: Self-pay

## 2020-09-18 ENCOUNTER — Telehealth (HOSPITAL_COMMUNITY): Payer: Self-pay

## 2020-09-18 ENCOUNTER — Other Ambulatory Visit (HOSPITAL_COMMUNITY): Payer: Self-pay | Admitting: *Deleted

## 2020-09-18 ENCOUNTER — Encounter (HOSPITAL_COMMUNITY)
Admission: RE | Admit: 2020-09-18 | Discharge: 2020-09-18 | Disposition: A | Discharge status: Home / Self Care | Payer: BC Managed Care – PPO | Source: Ambulatory Visit | Attending: Internal Medicine | Admitting: Internal Medicine

## 2020-09-18 ENCOUNTER — Other Ambulatory Visit: Payer: Self-pay

## 2020-09-18 DIAGNOSIS — I5042 Chronic combined systolic (congestive) and diastolic (congestive) heart failure: Secondary | ICD-10-CM | POA: Diagnosis not present

## 2020-09-18 DIAGNOSIS — I5041 Acute combined systolic (congestive) and diastolic (congestive) heart failure: Secondary | ICD-10-CM

## 2020-09-18 MED ORDER — CLOPIDOGREL BISULFATE 75 MG PO TABS: 75.0000 mg | ORAL_TABLET | Freq: Every day | ORAL | 3 refills | Status: DC

## 2020-09-18 MED ORDER — ROSUVASTATIN CALCIUM 40 MG PO TABS: 40.0000 mg | ORAL_TABLET | Freq: Every day | ORAL | 11 refills | Status: DC

## 2020-09-18 NOTE — Telephone Encounter (Signed)
Pt cardiac rehab nurse Byrd Hesselbach RN, stated that pt wanted to switch to the 3pm cardiac rehab class. Changed pt cardiac rehab session to 3pm.

## 2020-09-21 ENCOUNTER — Other Ambulatory Visit: Payer: Self-pay

## 2020-09-21 ENCOUNTER — Encounter (HOSPITAL_COMMUNITY)
Admission: RE | Admit: 2020-09-21 | Discharge: 2020-09-21 | Disposition: A | Discharge status: Home / Self Care | Payer: BC Managed Care – PPO | Source: Ambulatory Visit | Attending: Internal Medicine | Admitting: Internal Medicine

## 2020-09-21 ENCOUNTER — Encounter (HOSPITAL_COMMUNITY): Payer: BC Managed Care – PPO

## 2020-09-21 DIAGNOSIS — I5042 Chronic combined systolic (congestive) and diastolic (congestive) heart failure: Secondary | ICD-10-CM | POA: Diagnosis not present

## 2020-09-21 DIAGNOSIS — I5041 Acute combined systolic (congestive) and diastolic (congestive) heart failure: Secondary | ICD-10-CM

## 2020-09-23 ENCOUNTER — Telehealth (HOSPITAL_COMMUNITY): Payer: Self-pay | Admitting: Family Medicine

## 2020-09-23 ENCOUNTER — Encounter (HOSPITAL_COMMUNITY): Payer: BC Managed Care – PPO

## 2020-09-25 ENCOUNTER — Encounter (HOSPITAL_COMMUNITY): Payer: BC Managed Care – PPO

## 2020-09-25 ENCOUNTER — Telehealth (HOSPITAL_COMMUNITY): Payer: Self-pay | Admitting: *Deleted

## 2020-09-25 NOTE — Telephone Encounter (Signed)
Left message to call cardiac rehab.Gladstone Lighter, RN,BSN 09/25/2020 2:17 PM

## 2020-09-28 ENCOUNTER — Encounter (HOSPITAL_COMMUNITY): Payer: BC Managed Care – PPO

## 2020-09-29 NOTE — Progress Notes (Signed)
Cardiac Individual Treatment Plan  Patient Details  Name: Alexander [name] MRN: [id number] Date of Birth: [birth year] Referring Provider:   Flowsheet Row CARDIAC REHAB PHASE II ORIENTATION from 07/16/2020 in [facility]  Referring Provider Bensimhon, Daniel MD      Initial Encounter Date:  [place] from 07/16/2020 in [facility]  Date 07/16/20      Visit Diagnosis: Heart failure, systolic and diastolic, chronic ([place])  Acute combined systolic and diastolic HF (heart failure) ([place])  Patient's Home Medications on Admission:  Current Outpatient Medications:  .  acetaminophen (TYLENOL) 500 MG tablet, Take 500 mg by mouth every 6 (six) hours as needed for moderate pain., Disp: , Rfl:  .  allopurinol (ZYLOPRIM) 100 MG tablet, Take 100 mg by mouth daily., Disp: , Rfl:  .  amiodarone (PACERONE) 200 MG tablet, Take 200 mg by mouth daily., Disp: , Rfl:  .  buPROPion (WELLBUTRIN XL) 300 MG 24 hr tablet, Take 300 mg by mouth daily., Disp: , Rfl:  .  carvedilol (COREG) 6.25 MG tablet, Take 1 tablet (6.25 mg total) by mouth 2 (two) times daily with a meal., Disp: 60 tablet, Rfl: 3 .  clonazePAM (KLONOPIN) 0.5 MG tablet, Take 0.5 mg by mouth 2 (two) times daily as needed for anxiety., Disp: , Rfl:  .  clopidogrel (PLAVIX) 75 MG tablet, Take 1 tablet (75 mg total) by mouth daily., Disp: 90 tablet, Rfl: 3 .  colchicine 0.6 MG tablet, Take 0.6 mg by mouth 2 (two) times daily as needed (gout)., Disp: , Rfl:  .  dapagliflozin propanediol (FARXIGA) 10 MG TABS tablet, Take 1 tablet (10 mg total) by mouth daily., Disp: 30 tablet, Rfl: 6 .  fenofibrate (TRICOR) 145 MG tablet, Take 145 mg by mouth daily., Disp: , Rfl:  .  furosemide (LASIX) 20 MG tablet, Take 1 tablet (20 mg total) by mouth as needed for fluid or edema., Disp: 30 tablet, Rfl: 11 .  Multiple Vitamin (MULTIVITAMIN WITH MINERALS) TABS tablet, Take 1  tablet by mouth daily., Disp: , Rfl:  .  nitroGLYCERIN (NITROSTAT) 0.4 MG SL tablet, Place 1 tablet (0.4 mg total) under the tongue every 5 (five) minutes as needed for chest pain., Disp: 25 tablet, Rfl: 0 .  potassium chloride SA (KLOR-CON) 20 MEQ tablet, Take 1 tablet (20 mEq total) by mouth as needed (with Lasix)., Disp: 90 tablet, Rfl: 3 .  rosuvastatin (CRESTOR) 40 MG tablet, Take 1 tablet (40 mg total) by mouth daily., Disp: 30 tablet, Rfl: 11 .  sacubitril-valsartan (ENTRESTO) 97-103 MG, Take 1 tablet by mouth 2 (two) times daily., Disp: 60 tablet, Rfl: 6 .  spironolactone (ALDACTONE) 25 MG tablet, Take 1 tablet (25 mg total) by mouth daily., Disp: 30 tablet, Rfl: 6 .  traMADol (ULTRAM) 50 MG tablet, Take 50 mg by mouth 3 (three) times daily as needed for moderate pain., Disp: , Rfl:  .  XARELTO 20 MG TABS tablet, Take 1 tablet (20 mg total) by mouth daily., Disp: 30 tablet, Rfl: 3  Past Medical History: Past Medical History:  Diagnosis Date  . Arrhythmia   . Atrial fibrillation ([place])   . CAD (coronary artery disease) 08/17/2020   PCI + DES to pLCx, 70% mLAD negative FFR testing (medical therapy)   . CHF (congestive heart failure) ([place])   . Chronic systolic heart failure ([place])   . Gout   . Hyperlipidemia   . Hypertension   .  OSA (obstructive sleep apnea) 09/2020    Tobacco Use: Social History   Tobacco Use  Smoking Status Never Smoker  Smokeless Tobacco Never Used    Labs: Recent Review Flowsheet Data    Labs for ITP Cardiac and Pulmonary Rehab Latest Ref Rng & Units 05/16/2020 05/16/2020 05/17/2020 05/18/2020 08/17/2020   Hemoglobin A1c 4.8 - 5.6 % - - 6.0(H) - -   PHART 7.350 - 7.450 - - - - 7.400   PCO2ART 32.0 - 48.0 mmHg - - - - 38.4   HCO3 20.0 - 28.0 mmol/L - - - - 23.8   TCO2 22 - 32 mmol/L - - - - 25   ACIDBASEDEF 0.0 - 2.0 mmol/L - - - - 1.0   O2SAT % 47.0 58.6 55.2 59.6 99.0      Capillary Blood Glucose: No results found for: GLUCAP   Exercise Target  Goals: Exercise Program Goal: Individual exercise prescription set using results from initial 6 min walk test and THRR while considering  patient's activity barriers and safety.   Exercise Prescription Goal: Starting with aerobic activity 30 plus minutes a day, 3 days per week for initial exercise prescription. Provide home exercise prescription and guidelines that participant acknowledges understanding prior to discharge.  Activity Barriers & Risk Stratification:  Activity Barriers & Cardiac Risk Stratification - 07/16/20 1611      Activity Barriers & Cardiac Risk Stratification   Activity Barriers Arthritis;Joint Problems   Arthritis: bilateral ankles, low back, fingers. Joint problems: left shoulder weakness.   Cardiac Risk Stratification High   Below 5 MET's on walk test          6 Minute Walk:  6 Minute Walk    Row Name 07/16/20 1607         6 Minute Walk   Phase Initial     Distance 1694 feet     Walk Time 6 minutes     # of Rest Breaks 0     MPH 3.21     METS 4.29     RPE 11     Perceived Dyspnea  0     VO2 Peak 15.03     Symptoms No     Resting HR 65 bpm     Resting BP 124/94     Resting Oxygen Saturation  96 %     Exercise Oxygen Saturation  during 6 min walk 97 %     Max Ex. HR 86 bpm     Max Ex. BP 142/94     2 Minute Post BP 138/94            Oxygen Initial Assessment:   Oxygen Re-Evaluation:   Oxygen Discharge (Final Oxygen Re-Evaluation):   Initial Exercise Prescription:  Initial Exercise Prescription - 07/16/20 1600      Date of Initial Exercise RX and Referring Provider   Date 07/16/20    Referring Provider Glori Bickers MD    Expected Discharge Date 09/11/20      Recumbant Bike   Level 2    RPM 60    Minutes 15    METs 2.5      Arm Ergometer   Level 1.5    RPM 60    Minutes 15    METs 2      Prescription Details   Frequency (times per week) 3    Duration Progress to 30 minutes of continuous aerobic without  signs/symptoms of physical distress      Intensity  THRR 40-80% of Max Heartrate 71-142    Ratings of Perceived Exertion 11-13    Perceived Dyspnea 0-4      Progression   Progression Continue progressive overload as per policy without signs/symptoms or physical distress.      Resistance Training   Training Prescription Yes    Weight 5    Reps 10-15           Perform Capillary Blood Glucose checks as needed.  Exercise Prescription Changes:  Exercise Prescription Changes    Row Name 07/20/20 1127 07/27/20 1124 07/29/20 1118 08/14/20 1112 09/14/20 1117     Response to Exercise   Blood Pressure (Admit) 130/90 132/90 148/90 138/92 130/84   Blood Pressure (Exercise) 128/90 138/92 142/92 142/92 138/84   Blood Pressure (Exit) 122/88 142/94 120/90 130/80 122/82   Heart Rate (Admit) 81 bpm 86 bpm 80 bpm 75 bpm 74 bpm   Heart Rate (Exercise) 87 bpm 99 bpm 107 bpm 97 bpm 88 bpm   Heart Rate (Exit) 74 bpm 85 bpm 81 bpm 75 bpm 62 bpm   Rating of Perceived Exertion (Exercise) '12 12 11 11 11   ' Symptoms none none none none none   Comments Off to a great start with exercise. -- -- -- Patient returned to exercise today and tolerated well without symptoms.   Duration Continue with 30 min of aerobic exercise without signs/symptoms of physical distress. Continue with 30 min of aerobic exercise without signs/symptoms of physical distress. Continue with 30 min of aerobic exercise without signs/symptoms of physical distress. Continue with 30 min of aerobic exercise without signs/symptoms of physical distress. Continue with 30 min of aerobic exercise without signs/symptoms of physical distress.   Intensity THRR unchanged THRR unchanged THRR unchanged THRR unchanged THRR unchanged     Progression   Progression Continue to progress workloads to maintain intensity without signs/symptoms of physical distress. Continue to progress workloads to maintain intensity without signs/symptoms of physical distress.  Continue to progress workloads to maintain intensity without signs/symptoms of physical distress. Continue to progress workloads to maintain intensity without signs/symptoms of physical distress. Continue to progress workloads to maintain intensity without signs/symptoms of physical distress.   Average METs 2.8 3.6 3.8 3.9 3.7     Resistance Training   Training Prescription Yes No No Yes No   Weight 5 -- -- 8 lbs --   Reps 10-15 -- -- 10-15 --   Time 10 Minutes -- -- 10 Minutes --     Interval Training   Interval Training No No No No No     Treadmill   MPH -- 3.2 3.2 3.2 3.2   Grade -- '2 2 2 2   ' Minutes -- '15 15 15 15   ' METs -- 4.33 4.33 4.33 4.33     Recumbant Bike   Level 3 -- -- -- --   Minutes 15 -- -- -- --   METs 2.8 -- -- -- --     NuStep   Level -- '5 5 5 5   ' SPM -- 85 85 85 85   Minutes -- '15 15 15 15   ' METs -- 2.8 3.2 3.4 3.1     Arm Ergometer   Level 3 -- -- -- --   Watts 7 -- -- -- --   Minutes 15 -- -- -- --     Home Exercise Plan   Plans to continue exercise at [employer] (comment) 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 Add 4 additional days to program exercise sessions. Add 4 additional days to program exercise sessions. Add 4 additional days to program exercise sessions. Add 4 additional days to program exercise sessions.   Initial Home Exercises Provided -- -- 07/29/20 07/29/20 07/29/20          Exercise Comments:  Exercise Comments    Row Name 07/20/20 1222 07/22/20 1200 07/29/20 1200 09/01/20 0842 09/14/20 1158   Exercise Comments Patient tolerated first session of exercise very well without symptoms. Patient feels the arm ergometer is too light for him, and we discussed changing his exercise equipment at cardiac rehab. Patient will switch from the recumbent bike and arm ergometer to the treadmill and  recumbent step machine. Reviewed home exercise guidelines, METs, and goals with patient. Patient had stent placed on 08/17/20. Will resume exercise on 09/02/20. Patient returned to exercise for the first time since having stent placed and tolerated exercise well without symptoms.          Exercise Goals and Review:  Exercise Goals    Row Name 07/16/20 1624             Exercise Goals   Increase Physical Activity Yes       Intervention Provide advice, education, support and counseling about physical activity/exercise needs.;Develop an individualized exercise prescription for aerobic and resistive training based on initial evaluation findings, risk stratification, comorbidities and participant's personal goals.       Expected Outcomes Short Term: Attend rehab on a regular basis to increase amount of physical activity.;Long Term: Add in home exercise to make exercise part of routine and to increase amount of physical activity.;Long Term: Exercising regularly at least 3-5 days a week.       Increase Strength and Stamina Yes       Intervention Provide advice, education, support and counseling about physical activity/exercise needs.;Develop an individualized exercise prescription for aerobic and resistive training based on initial evaluation findings, risk stratification, comorbidities and participant's personal goals.       Expected Outcomes Short Term: Increase workloads from initial exercise prescription for resistance, speed, and METs.;Short Term: Perform resistance training exercises routinely during rehab and add in resistance training at home;Long Term: Improve cardiorespiratory fitness, muscular endurance and strength as measured by increased METs and functional capacity (6MWT)       Able to understand and use rate of perceived exertion (RPE) scale Yes       Intervention Provide education and explanation on how to use RPE scale       Expected Outcomes Short Term: Able to use RPE daily in rehab  to express subjective intensity level;Long Term:  Able to use RPE to guide intensity level when exercising independently       Knowledge and understanding of Target Heart Rate Range (THRR) Yes       Intervention Provide education and explanation of THRR including how the numbers were predicted and where they are located for reference       Expected Outcomes Short Term: Able to state/look up THRR;Long Term: Able to use THRR to govern intensity when exercising independently;Short Term: Able to use daily as guideline for intensity in rehab       Understanding of Exercise Prescription Yes       Intervention Provide education, explanation, and written materials on patient's individual exercise prescription       Expected Outcomes Short Term: Able to explain program exercise  prescription;Long Term: Able to explain home exercise prescription to exercise independently              Exercise Goals Re-Evaluation :  Exercise Goals Re-Evaluation    Row Name 07/20/20 1436 07/29/20 1200 09/01/20 0842 09/14/20 1158       Exercise Goal Re-Evaluation   Exercise Goals Review Increase Physical Activity;Able to understand and use rate of perceived exertion (RPE) scale;Able to check pulse independently Increase Physical Activity;Able to understand and use rate of perceived exertion (RPE) scale;Able to check pulse independently;Increase Strength and Stamina;Knowledge and understanding of Target Heart Rate Range (THRR);Understanding of Exercise Prescription Increase Physical Activity;Able to understand and use rate of perceived exertion (RPE) scale;Able to check pulse independently;Increase Strength and Stamina;Knowledge and understanding of Target Heart Rate Range (THRR);Understanding of Exercise Prescription Increase Physical Activity;Able to understand and use rate of perceived exertion (RPE) scale;Able to check pulse independently;Increase Strength and Stamina;Knowledge and understanding of Target Heart Rate Range  (THRR);Understanding of Exercise Prescription    Comments Patient able to use RPE scale appropriately. Reviewed THRR and MET level with patient. Patient exercises at the gym 4-5 days/ week. Patient exercises aerobically 30 minutes and uses up to 20 lb weights for his resistance training. Patient has a smart watch to check his pulse. Patient is making excellent progress with exercise. Patient is exercising aerobically at least 30 minutes, 3-4 days/week. Patient's goal is to live a healthy life and lose weight. Patient's goal weight is 220 lbs. Patient has a smart watch to monitor his heart rate. Patient has been out since 08/14/20, unable to review METs and goals. Patient had stent placed on 08/17/20 and was on medical hold, then out of town. Patient plans to return to cardiac rehab on 09/02/20. Patient returned to exercise for the first time since stent placement on 08/17/20 and tolerated well without symptoms.    Expected Outcomes Progress workloads as tolerated to help achieve personal health and fitness goals. Patient will continue his current exericse routine and progress workloads as tolerated to help achieve his personal health and fitness goals. Review goals when patient returns to cardiac rehab. Progress workloads as tolerated to help achieve personal health and fitness goals.            Discharge Exercise Prescription (Final Exercise Prescription Changes):  Exercise Prescription Changes - 09/14/20 1117      Response to Exercise   Blood Pressure (Admit) 130/84    Blood Pressure (Exercise) 138/84    Blood Pressure (Exit) 122/82    Heart Rate (Admit) 74 bpm    Heart Rate (Exercise) 88 bpm    Heart Rate (Exit) 62 bpm    Rating of Perceived Exertion (Exercise) 11    Symptoms none    Comments Patient returned to exercise today and tolerated well without symptoms.    Duration Continue with 30 min of aerobic exercise without signs/symptoms of physical distress.    Intensity THRR unchanged       Progression   Progression Continue to progress workloads to maintain intensity without signs/symptoms of physical distress.    Average METs 3.7      Resistance Training   Training Prescription No      Interval Training   Interval Training No      Treadmill   MPH 3.2    Grade 2    Minutes 15    METs 4.33      NuStep   Level 5    SPM 85    Minutes  15    METs 3.1      Home Exercise Plan   Plans to continue exercise at [facility] (comment)    Frequency Add 4 additional days to program exercise sessions.    Initial Home Exercises Provided 07/29/20           Nutrition:  Target Goals: Understanding of nutrition guidelines, daily intake of sodium <1566m, cholesterol <2056m calories 30% from fat and 7% or less from saturated fats, daily to have 5 or more servings of fruits and vegetables.  Biometrics:  Pre Biometrics - 07/16/20 1605      Pre Biometrics   Waist Circumference 52.25 inches    Hip Circumference 48 inches    Waist to Hip Ratio 1.09 %    Triceps Skinfold 24 mm    % Body Fat 38.2 %    Grip Strength 58 kg    Flexibility 2 in    Single Leg Stand 15 seconds            Nutrition Therapy Plan and Nutrition Goals:  Nutrition Therapy & Goals - 07/31/20 1211      Nutrition Therapy   Diet TLC; low sodium      Personal Nutrition Goals   Nutrition Goal Pt to identify and limit food sources of saturated fat, trans fat, refined carbohydrates and sodium    Personal Goal #2 Pt to identify food quantities necessary to achieve weight loss of 6-24 lb at graduation from cardiac rehab.      Intervention Plan   Intervention Prescribe, educate and counsel regarding individualized specific dietary modifications aiming towards targeted core components such as weight, hypertension, lipid management, diabetes, heart failure and other comorbidities.    Expected Outcomes Short Term Goal: A plan has been developed with personal nutrition goals set during dietitian  appointment.;Long Term Goal: Adherence to prescribed nutrition plan.           Nutrition Assessments:  MEDIFICTS Score Key:  ?70 Need to make dietary changes   40-70 Heart Healthy Diet  ? 40 Therapeutic Level Cholesterol Diet  Flowsheet Row CARDIAC REHAB PHASE [facility] Total Score on Ad[facility] 66     Picture Your Plate Scores:  <4<41nhealthy dietary pattern with much room for improvement.  41-50 Dietary pattern unlikely to meet recommendations for good health and room for improvement.  51-60 More healthful dietary pattern, with some room for improvement.   >60 Healthy dietary pattern, although there may be some specific behaviors that could be improved.    Nutrition Goals Re-Evaluation:  Nutrition Goals Re-Evaluation    [facility] 07/31/20 1211 08/28/20 1342 09/29/20 0757         Goals   Current Weight 288 lb (130.6 kg) 290 lb 9.1 oz (131.8 kg) 288 lb 2.3 oz (130.7 kg)     Nutrition Goal -- Pt to identify and limit food sources of saturated fat, trans fat, refined carbohydrates and sodium Pt to identify and limit food sources of saturated fat, trans fat, refined carbohydrates and sodium     Comment -- -- Weight has been maintained     Expected Outcome Weight loss and sodium restriction Weight loss and sodium restriction Weight loss and sodium restriction           Personal Goal #2 Re-Evaluation   Personal Goal #2 -- Pt to identify food quantities necessary to achieve weight loss of 6-24 lb at graduation from cardiac  rehab. Pt to identify food quantities necessary to achieve weight loss of 6-24 lb at graduation from cardiac rehab.            Nutrition Goals Discharge (Final Nutrition Goals Re-Evaluation):  Nutrition Goals Re-Evaluation - 09/29/20 0757      Goals   Current Weight 288 lb 2.3 oz (130.7 kg)    Nutrition Goal Pt to identify and limit food sources of saturated fat, trans  fat, refined carbohydrates and sodium    Comment Weight has been maintained    Expected Outcome Weight loss and sodium restriction      Personal Goal #2 Re-Evaluation   Personal Goal #2 Pt to identify food quantities necessary to achieve weight loss of 6-24 lb at graduation from cardiac rehab.           Psychosocial: Target Goals: Acknowledge presence or absence of significant depression and/or stress, maximize coping skills, provide positive support system. Participant is able to verbalize types and ability to use techniques and skills needed for reducing stress and depression.  Initial Review & Psychosocial Screening:  Initial Psych Review & Screening - 07/17/20 0837      Initial Review   Current issues with History of Depression;Current Anxiety/Panic      Family Dynamics   Good Support System? Yes   Alexander [name] is divorced. Alexander [name] has his ex wife and friends for support   Comments Alexander [name] has anxiety adjustment disorder      Barriers   Psychosocial barriers to participate in program The patient should benefit from training in stress management and relaxation.      Screening Interventions   Interventions To provide support and resources with identified psychosocial needs;Encouraged to exercise;Provide feedback about the scores to participant    Expected Outcomes Long Term Goal: Stressors or current issues are controlled or eliminated.;Short Term goal: Identification and review with participant of any Quality of Life or Depression concerns found by scoring the questionnaire.           Quality of Life Scores:  Quality of Life - 07/16/20 1628      Quality of Life   Select Quality of Life      Quality of Life Scores   Health/Function Pre 22.77 %    Socioeconomic Pre 27.43 %    Psych/Spiritual Pre 19.17 %    Family Pre 24.3 %    GLOBAL Pre 23.33 %          Scores of 19 and below usually indicate a poorer quality of life in these areas.  A difference of  2-3 points is a  clinically meaningful difference.  A difference of 2-3 points in the total score of the Quality of Life Index has been associated with significant improvement in overall quality of life, self-image, physical symptoms, and general health in studies assessing change in quality of life.  PHQ-9: Recent Review Flowsheet Data    Depression screen [facility] 2/9 07/17/2020 02/24/2020   Decreased Interest 0 0   Down, Depressed, Hopeless 0 0   PHQ - 2 Score 0 0     Interpretation of Total Score  Total Score Depression Severity:  1-4 = Minimal depression, 5-9 = Mild depression, 10-14 = Moderate depression, 15-19 = Moderately severe depression, 20-27 = Severe depression   Psychosocial Evaluation and Intervention:   Psychosocial Re-Evaluation:  Psychosocial Re-Evaluation    [place] Name 08/04/20 0810 09/01/20 6283 09/29/20 1622         Psychosocial Re-Evaluation   Current issues with  Current Anxiety/Panic;History of Depression Current Anxiety/Panic;History of Depression Current Anxiety/Panic;History of Depression     Comments Alexander [name] has not voiced any increased concerns or stressors Unable to assess patient has not exercised since 08/14/20 No concerns or stressors voiced durinng exercise     Expected Outcomes Alexander [name] will have decreased stress upon completion of phase 2 cardiac rehab Alexander [name] will have decreased stress upon completion of phase 2 cardiac rehab Alexander [name] will have decreased stress upon completion of phase 2 cardiac rehab     Interventions Encouraged to attend Cardiac Rehabilitation for the exercise Encouraged to attend Cardiac Rehabilitation for the exercise Encouraged to attend Cardiac Rehabilitation for the exercise     Continue Psychosocial Services  No Follow up required No Follow up required No Follow up required            Psychosocial Discharge (Final Psychosocial Re-Evaluation):  Psychosocial Re-Evaluation - 09/29/20 1622      Psychosocial Re-Evaluation   Current issues with Current  Anxiety/Panic;History of Depression    Comments No concerns or stressors voiced durinng exercise    Expected Outcomes [place] will have decreased stress upon completion of phase 2 cardiac rehab    Interventions Encouraged to attend Cardiac Rehabilitation for the exercise    Continue Psychosocial Services  No Follow up required           Vocational Rehabilitation: Provide vocational rehab assistance to qualifying candidates.   Vocational Rehab Evaluation & Intervention:  Vocational Rehab - 07/17/20 0841      Initial Vocational Rehab Evaluation & Intervention   Assessment shows need for Vocational Rehabilitation No   Alexander [name] is working full time and does not need vocational rehab at this time          Education: Education Goals: Education classes will be provided on a weekly basis, covering required topics. Participant will state understanding/return demonstration of topics presented.  Learning Barriers/Preferences:  Learning Barriers/Preferences - 07/16/20 1634      Learning Barriers/Preferences   Learning Barriers None    Learning Preferences Individual Instruction;Computer/Internet           Education Topics: Hypertension, Hypertension Reduction -Define heart disease and high blood pressure. Discus how high blood pressure affects the body and ways to reduce high blood pressure.   Exercise and Your Heart -Discuss why it is important to exercise, the FITT principles of exercise, normal and abnormal responses to exercise, and how to exercise safely.   Angina -Discuss definition of angina, causes of angina, treatment of angina, and how to decrease risk of having angina.   Cardiac Medications -Review what the following cardiac medications are used for, how they affect the body, and side effects that may occur when taking the medications.  Medications include Aspirin, Beta blockers, calcium channel blockers, ACE Inhibitors, angiotensin receptor blockers, diuretics,  digoxin, and antihyperlipidemics.   Congestive Heart Failure -Discuss the definition of CHF, how to live with CHF, the signs and symptoms of CHF, and how keep track of weight and sodium intake.   Heart Disease and Intimacy -Discus the effect sexual activity has on the heart, how changes occur during intimacy as we age, and safety during sexual activity.   Smoking Cessation / COPD -Discuss different methods to quit smoking, the health benefits of quitting smoking, and the definition of COPD.   Nutrition I: Fats -Discuss the types of cholesterol, what cholesterol does to the heart, and how cholesterol levels can be controlled.   Nutrition II: Labels -Discuss the different components of food  labels and how to read food label   Heart Parts/Heart Disease and PAD -Discuss the anatomy of the heart, the pathway of blood circulation through the heart, and these are affected by heart disease.   Stress I: Signs and Symptoms -Discuss the causes of stress, how stress may lead to anxiety and depression, and ways to limit stress.   Stress II: Relaxation -Discuss different types of relaxation techniques to limit stress.   Warning Signs of Stroke / TIA -Discuss definition of a stroke, what the signs and symptoms are of a stroke, and how to identify when someone is having stroke.   Knowledge Questionnaire Score:  Knowledge Questionnaire Score - 07/16/20 1633      Knowledge Questionnaire Score   Pre Score 21/24           Core Components/Risk Factors/Patient Goals at Admission:  Personal Goals and Risk Factors at Admission - 07/16/20 1635      Core Components/Risk Factors/Patient Goals on Admission    Weight Management Yes;Weight Loss    Intervention Weight Management: Develop a combined nutrition and exercise program designed to reach desired caloric intake, while maintaining appropriate intake of nutrient and fiber, sodium and fats, and appropriate energy expenditure required for  the weight goal.;Weight Management: Provide education and appropriate resources to help participant work on and attain dietary goals.;Weight Management/Obesity: Establish reasonable short term and long term weight goals.;Obesity: Provide education and appropriate resources to help participant work on and attain dietary goals.    Heart Failure Yes    Intervention Provide a combined exercise and nutrition program that is supplemented with education, support and counseling about heart failure. Directed toward relieving symptoms such as shortness of breath, decreased exercise tolerance, and extremity edema.    Expected Outcomes Improve functional capacity of life;Short term: Attendance in program 2-3 days a week with increased exercise capacity. Reported lower sodium intake. Reported increased fruit and vegetable intake. Reports medication compliance.;Short term: Daily weights obtained and reported for increase. Utilizing diuretic protocols set by physician.;Long term: Adoption of self-care skills and reduction of barriers for early signs and symptoms recognition and intervention leading to self-care maintenance.    Hypertension Yes    Intervention Provide education on lifestyle modifcations including regular physical activity/exercise, weight management, moderate sodium restriction and increased consumption of fresh fruit, vegetables, and low fat dairy, alcohol moderation, and smoking cessation.;Monitor prescription use compliance.    Expected Outcomes Short Term: Continued assessment and intervention until BP is < 140/24m HG in hypertensive participants. < 130/858mHG in hypertensive participants with diabetes, heart failure or chronic kidney disease.;Long Term: Maintenance of blood pressure at goal levels.    Lipids Yes    Intervention Provide education and support for participant on nutrition & aerobic/resistive exercise along with prescribed medications to achieve LDL <7060mHDL >45m70m  Expected  Outcomes Short Term: Participant states understanding of desired cholesterol values and is compliant with medications prescribed. Participant is following exercise prescription and nutrition guidelines.;Long Term: Cholesterol controlled with medications as prescribed, with individualized exercise RX and with personalized nutrition plan. Value goals: LDL < 70mg27mL > 40 mg.    Stress Yes    Intervention Offer individual and/or small group education and counseling on adjustment to heart disease, stress management and health-related lifestyle change. Teach and support self-help strategies.;Refer participants experiencing significant psychosocial distress to appropriate mental health specialists for further evaluation and treatment. When possible, include family members and significant others in education/counseling sessions.    Expected Outcomes Short  Term: Participant demonstrates changes in health-related behavior, relaxation and other stress management skills, ability to obtain effective social support, and compliance with psychotropic medications if prescribed.;Long Term: Emotional wellbeing is indicated by absence of clinically significant psychosocial distress or social isolation.    Personal Goal Other Yes    Personal Goal Pt wants to form sustaining healthy habits that he can maintain through life. Pt wants to lose 60 lbs over time    Intervention Will work with pt to create resonable steps to help achive his goals.    Expected Outcomes Will help pt assess goals through education and monitoring.           Core Components/Risk Factors/Patient Goals Review:   Goals and Risk Factor Review    Row Name 07/20/20 1600 08/04/20 5366 09/01/20 0809 09/29/20 1623       Core Components/Risk Factors/Patient Goals Review   Personal Goals Review Weight Management/Obesity;Heart Failure;Stress;Hypertension;Lipids Weight Management/Obesity;Heart Failure;Stress;Hypertension;Lipids Weight  Management/Obesity;Heart Failure;Stress;Hypertension;Lipids Weight Management/Obesity;Heart Failure;Stress;Hypertension;Lipids    Review [place] started exercise on 07/20/20 Did well Alexander [name] continues to have some systolic BP's in the 44'I. Will continue to monitor Alexander [name] has been doing well with exercise. Alexander [name]'s diastolic BP's continue to run in the 90's. Exercise flow sheets with BP's were forwarded to Dr Bensimhon's office for review. Alexander [name] exercise is currently on hold post PCI/ Stent. Patient would like to extend participation in the program. Alexander [name] has been doing well with exercise at cardiac rehab since his return. Alexander [name]'s vital signs have been stable.Blood pressures have been improved since BP meds have been adjusted.    Expected Outcomes Alexander [name] will continue to participate in phase 2 cardiac rehab for exercise, nutrition and lifestyle modifications Alexander [name] will continue to participate in phase 2 cardiac rehab for exercise, nutrition and lifestyle modifications Alexander [name] will continue to participate in phase 2 cardiac rehab for exercise, nutrition and lifestyle modifications upon return to exercise. Alexander [name] will continue to participate in phase 2 cardiac rehab for exercise, nutrition and lifestyle modifications upon return to exercise.           Core Components/Risk Factors/Patient Goals at Discharge (Final Review):   Goals and Risk Factor Review - 09/29/20 1623      Core Components/Risk Factors/Patient Goals Review   Personal Goals Review Weight Management/Obesity;Heart Failure;Stress;Hypertension;Lipids    Review Alexander [name] has been doing well with exercise at cardiac rehab since his return. Alexander [name]'s vital signs have been stable.Blood pressures have been improved since BP meds have been adjusted.    Expected Outcomes Duvid will continue to participate in phase 2 cardiac rehab for exercise, nutrition and lifestyle modifications upon return to exercise.           ITP Comments:  ITP Comments     Row Name 07/16/20 1433 07/20/20 1559 08/04/20 0809 09/01/20 0804 09/29/20 1619   ITP Comments Dr Fransico Him MD, Medical Director 30 day ITP Review. Alexander [name] started exercise on 07/20/20 and did well with exercise. 30 day ITP Review. 30 Day ITP Review. Alexander [name] has good attendance and participation in phase 2 cardiac rehab 30 day ITP Review. 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 Exercise is currently on hold pending post procedure follow up 30 day ITP Review. Alexander [name] has returned to exercise. Alexander [name] plans to return to exercise on 10/01/23. as he was absent due to a sinus infection. Alexander [name] took a COVID 19 test which was negative          Comments: See ITP comments.Barnet Pall, RN,BSN 09/29/2020 4:26 PM

## 2020-09-30 ENCOUNTER — Other Ambulatory Visit: Payer: Self-pay

## 2020-09-30 ENCOUNTER — Encounter (HOSPITAL_COMMUNITY): Payer: BC Managed Care – PPO

## 2020-09-30 ENCOUNTER — Encounter (HOSPITAL_COMMUNITY)
Admission: RE | Admit: 2020-09-30 | Discharge: 2020-09-30 | Disposition: A | Discharge status: Home / Self Care | Payer: BC Managed Care – PPO | Source: Ambulatory Visit | Attending: Internal Medicine | Admitting: Internal Medicine

## 2020-09-30 DIAGNOSIS — I5042 Chronic combined systolic (congestive) and diastolic (congestive) heart failure: Secondary | ICD-10-CM

## 2020-09-30 DIAGNOSIS — I5041 Acute combined systolic (congestive) and diastolic (congestive) heart failure: Secondary | ICD-10-CM

## 2020-10-02 ENCOUNTER — Encounter (HOSPITAL_COMMUNITY): Payer: BC Managed Care – PPO

## 2020-10-07 ENCOUNTER — Encounter (HOSPITAL_COMMUNITY): Payer: BC Managed Care – PPO

## 2020-10-08 ENCOUNTER — Telehealth (HOSPITAL_COMMUNITY): Payer: Self-pay | Admitting: Family Medicine

## 2020-10-09 ENCOUNTER — Encounter (HOSPITAL_COMMUNITY): Payer: BC Managed Care – PPO

## 2020-10-15 NOTE — Progress Notes (Signed)
Discharge Progress Report  Patient Details  Name: Semir Brill MRN: [id number] Date of Birth: 1975/05/29 Referring Provider:   Flowsheet Row CARDIAC REHAB PHASE II ORIENTATION from 07/16/2020 in [facility]  Referring Provider Glori Bickers MD        Number of Visits: 13  Reason for Discharge:  Patient reached a stable level of exercise. Patient independent in their exercise. Early Exit:  Personal, Lack of attendance, and work conflicts  Smoking History:  Social History   Tobacco Use  Smoking Status Never  Smokeless Tobacco Never    Diagnosis:  Heart failure, systolic and diastolic, chronic (HCC)  Acute combined systolic and diastolic HF (heart failure) ([place])  ADL [facility]:   Initial Exercise Prescription:  Initial Exercise Prescription - 07/16/20 1600       Date of Initial Exercise RX and Referring Provider   Date 07/16/20    Referring Provider Glori Bickers MD    Expected Discharge Date 09/11/20      Recumbant Bike   Level 2    RPM 60    Minutes 15    METs 2.5      Arm Ergometer   Level 1.5    RPM 60    Minutes 15    METs 2      Prescription Details   Frequency (times per week) 3    Duration Progress to 30 minutes of continuous aerobic without signs/symptoms of physical distress      Intensity   THRR 40-80% of Max Heartrate 71-142    Ratings of Perceived Exertion 11-13    Perceived Dyspnea 0-4      Progression   Progression Continue progressive overload as per policy without signs/symptoms or physical distress.      Resistance Training   Training Prescription Yes    Weight 5    Reps 10-15             Discharge Exercise Prescription (Final Exercise Prescription Changes):  Exercise Prescription Changes - 09/30/20 1630       Response to Exercise   Blood Pressure (Admit) 122/80    Blood Pressure (Exercise) 134/86    Blood Pressure (Exit) 122/80    Heart Rate (Admit) 83 bpm    Heart Rate (Exercise)  109 bpm    Heart Rate (Exit) 86 bpm    Rating of Perceived Exertion (Exercise) 11    Symptoms None    Comments Pt's last day of exercise in the [facility] program    Duration Continue with 30 min of aerobic exercise without signs/symptoms of physical distress.    Intensity THRR unchanged      Progression   Progression Continue to progress workloads to maintain intensity without signs/symptoms of physical distress.    Average METs 3.4      Resistance Training   Training Prescription No    Weight No weights on wednesdays      Interval Training   Interval Training No      Treadmill   MPH 3.2    Grade 2    Minutes 15    METs 4.33      NuStep   Level 5    SPM 75    Minutes 15    METs 2.5      Home Exercise Plan   Plans to continue exercise at [employer] (comment)    Frequency Add 4 additional days to program exercise sessions.    Initial Home Exercises Provided 07/29/20  Functional Capacity:  6 Minute Walk     Row Name 07/16/20 1607         6 Minute Walk   Phase Initial     Distance 1694 feet     Walk Time 6 minutes     # of Rest Breaks 0     MPH 3.21     METS 4.29     RPE 11     Perceived Dyspnea  0     VO2 Peak 15.03     Symptoms No     Resting HR 65 bpm     Resting BP 124/94     Resting Oxygen Saturation  96 %     Exercise Oxygen Saturation  during 6 min walk 97 %     Max Ex. HR 86 bpm     Max Ex. BP 142/94     2 Minute Post BP 138/94              Psychological, QOL, Others - Outcomes: PHQ 2/9: Depression screen [facility] 2/9 07/17/2020 02/24/2020  Decreased Interest 0 0  Down, Depressed, Hopeless 0 0  PHQ - 2 Score 0 0    Quality of Life:  Quality of Life - 07/16/20 1628       Quality of Life   Select Quality of Life      Quality of Life Scores   Health/Function Pre 22.77 %    Socioeconomic Pre 27.43 %    Psych/Spiritual Pre 19.17 %    Family Pre 24.3 %    GLOBAL Pre 23.33 %             Personal Goals: Goals  established at orientation with interventions provided to work toward goal.  Personal Goals and Risk Factors at Admission - 07/16/20 1635       Core Components/Risk Factors/Patient Goals on Admission    Weight Management Yes;Weight Loss    Intervention Weight Management: Develop a combined nutrition and exercise program designed to reach desired caloric intake, while maintaining appropriate intake of nutrient and fiber, sodium and fats, and appropriate energy expenditure required for the weight goal.;Weight Management: Provide education and appropriate resources to help participant work on and attain dietary goals.;Weight Management/Obesity: Establish reasonable short term and long term weight goals.;Obesity: Provide education and appropriate resources to help participant work on and attain dietary goals.    Heart Failure Yes    Intervention Provide a combined exercise and nutrition program that is supplemented with education, support and counseling about heart failure. Directed toward relieving symptoms such as shortness of breath, decreased exercise tolerance, and extremity edema.    Expected Outcomes Improve functional capacity of life;Short term: Attendance in program 2-3 days a week with increased exercise capacity. Reported lower sodium intake. Reported increased fruit and vegetable intake. Reports medication compliance.;Short term: Daily weights obtained and reported for increase. Utilizing diuretic protocols set by physician.;Long term: Adoption of self-care skills and reduction of barriers for early signs and symptoms recognition and intervention leading to self-care maintenance.    Hypertension Yes    Intervention Provide education on lifestyle modifcations including regular physical activity/exercise, weight management, moderate sodium restriction and increased consumption of fresh fruit, vegetables, and low fat dairy, alcohol moderation, and smoking cessation.;Monitor prescription use  compliance.    Expected Outcomes Short Term: Continued assessment and intervention until BP is < 140/62m HG in hypertensive participants. < 130/873mHG in hypertensive participants with diabetes, heart failure or chronic kidney disease.;Long Term: Maintenance of blood  pressure at goal levels.    Lipids Yes    Intervention Provide education and support for participant on nutrition & aerobic/resistive exercise along with prescribed medications to achieve LDL <22m, HDL >422m    Expected Outcomes Short Term: Participant states understanding of desired cholesterol values and is compliant with medications prescribed. Participant is following exercise prescription and nutrition guidelines.;Long Term: Cholesterol controlled with medications as prescribed, with individualized exercise RX and with personalized nutrition plan. Value goals: LDL < 7045mHDL > 40 mg.    Stress Yes    Intervention Offer individual and/or small group education and counseling on adjustment to heart disease, stress management and health-related lifestyle change. Teach and support self-help strategies.;Refer participants experiencing significant psychosocial distress to appropriate mental health specialists for further evaluation and treatment. When possible, include family members and significant others in education/counseling sessions.    Expected Outcomes Short Term: Participant demonstrates changes in health-related behavior, relaxation and other stress management skills, ability to obtain effective social support, and compliance with psychotropic medications if prescribed.;Long Term: Emotional wellbeing is indicated by absence of clinically significant psychosocial distress or social isolation.    Personal Goal Other Yes    Personal Goal Pt wants to form sustaining healthy habits that he can maintain through life. Pt wants to lose 60 lbs over time    Intervention Will work with pt to create resonable steps to help achive his goals.     Expected Outcomes Will help pt assess goals through education and monitoring.              Personal Goals Discharge:  Goals and Risk Factor Review     Row Name 07/20/20 1600 08/04/20 [id number] 09/29/20 1623 10/15/20 0820     Core Components/Risk Factors/Patient Goals Review   Personal Goals Review Weight Management/Obesity;Heart Failure;Stress;Hypertension;Lipids Weight Management/Obesity;Heart Failure;Stress;Hypertension;Lipids Weight Management/Obesity;Heart Failure;Stress;Hypertension;Lipids Weight Management/Obesity;Heart Failure;Stress;Hypertension;Lipids Weight Management/Obesity;Heart Failure;Stress;Hypertension;Lipids   Review JerDaekwonarted exercise on 07/20/20 Did well JerLesterntinues to have some systolic BP's in the 90'53'Mill continue to monitor JerRomeros been doing well with exercise. Kester's diastolic BP's continue to run in the 90's. Exercise flow sheets with BP's were forwarded to Dr Bensimhon's office for review. JerTrevonneercise is currently on hold post PCI/ Stent. Patient would like to extend participation in the program. JerKamares been doing well with exercise at cardiac rehab since his return. Rollie's vital signs have been stable.Blood pressures have been improved since BP meds have been adjusted. JerOryns been doing well with exercise at cardiac rehab since his return. Erich's vital signs have been stable.Blood pressures have been improved since BP meds have been adjusted. JerNaythenitched to the afternoon class but attended only a few sessions due to his work and family schedule.   Expected Outcomes JerAdariusll continue to participate in phase 2 cardiac rehab for exercise, nutrition and lifestyle modifications JerKalethll continue to participate in phase 2 cardiac rehab for exercise, nutrition and lifestyle modifications JerHuyll continue to participate in phase 2 cardiac rehab for exercise, nutrition and lifestyle modifications upon return to exercise.  JerLaderrickll continue to participate in phase 2 cardiac rehab for exercise, nutrition and lifestyle modifications upon return to exercise. JerNatanielll continue to participate  exercise,  follow nutrition and lifestyle modifications upon completion of phase 2 cardiac rehab.            Exercise Goals and Review:  Exercise Goals     Row Name 07/16/20 [id number]  Exercise Goals   Increase Physical Activity Yes       Intervention Provide advice, education, support and counseling about physical activity/exercise needs.;Develop an individualized exercise prescription for aerobic and resistive training based on initial evaluation findings, risk stratification, comorbidities and participant's personal goals.       Expected Outcomes Short Term: Attend rehab on a regular basis to increase amount of physical activity.;Long Term: Add in home exercise to make exercise part of routine and to increase amount of physical activity.;Long Term: Exercising regularly at least 3-5 days a week.       Increase Strength and Stamina Yes       Intervention Provide advice, education, support and counseling about physical activity/exercise needs.;Develop an individualized exercise prescription for aerobic and resistive training based on initial evaluation findings, risk stratification, comorbidities and participant's personal goals.       Expected Outcomes Short Term: Increase workloads from initial exercise prescription for resistance, speed, and METs.;Short Term: Perform resistance training exercises routinely during rehab and add in resistance training at home;Long Term: Improve cardiorespiratory fitness, muscular endurance and strength as measured by increased METs and functional capacity (6MWT)       Able to understand and use rate of perceived exertion (RPE) scale Yes       Intervention Provide education and explanation on how to use RPE scale       Expected Outcomes Short Term: Able to use RPE daily in rehab  to express subjective intensity level;Long Term:  Able to use RPE to guide intensity level when exercising independently       Knowledge and understanding of Target Heart Rate Range (THRR) Yes       Intervention Provide education and explanation of THRR including how the numbers were predicted and where they are located for reference       Expected Outcomes Short Term: Able to state/look up THRR;Long Term: Able to use THRR to govern intensity when exercising independently;Short Term: Able to use daily as guideline for intensity in rehab       Understanding of Exercise Prescription Yes       Intervention Provide education, explanation, and written materials on patient's individual exercise prescription       Expected Outcomes Short Term: Able to explain program exercise prescription;Long Term: Able to explain home exercise prescription to exercise independently                Exercise Goals Re-Evaluation:  Exercise Goals Re-Evaluation     Row Name 07/20/20 1436 07/29/20 1200 09/01/20 0842 09/14/20 1158       Exercise Goal Re-Evaluation   Exercise Goals Review Increase Physical Activity;Able to understand and use rate of perceived exertion (RPE) scale;Able to check pulse independently Increase Physical Activity;Able to understand and use rate of perceived exertion (RPE) scale;Able to check pulse independently;Increase Strength and Stamina;Knowledge and understanding of Target Heart Rate Range (THRR);Understanding of Exercise Prescription Increase Physical Activity;Able to understand and use rate of perceived exertion (RPE) scale;Able to check pulse independently;Increase Strength and Stamina;Knowledge and understanding of Target Heart Rate Range (THRR);Understanding of Exercise Prescription Increase Physical Activity;Able to understand and use rate of perceived exertion (RPE) scale;Able to check pulse independently;Increase Strength and Stamina;Knowledge and understanding of Target Heart Rate  Range (THRR);Understanding of Exercise Prescription    Comments Patient able to use RPE scale appropriately. Reviewed THRR and MET level with patient. Patient exercises at the gym 4-5 days/ week. Patient exercises aerobically 30 minutes and uses up to 20 lb  weights for his resistance training. Patient has a smart watch to check his pulse. Patient is making excellent progress with exercise. Patient is exercising aerobically at least 30 minutes, 3-4 days/week. Patient's goal is to live a healthy life and lose weight. Patient's goal weight is 220 lbs. Patient has a smart watch to monitor his heart rate. Patient has been out since 08/14/20, unable to review METs and goals. Patient had stent placed on 08/17/20 and was on medical hold, then out of town. Patient plans to return to cardiac rehab on 09/02/20. Patient returned to exercise for the first time since stent placement on 08/17/20 and tolerated well without symptoms.    Expected Outcomes Progress workloads as tolerated to help achieve personal health and fitness goals. Patient will continue his current exericse routine and progress workloads as tolerated to help achieve his personal health and fitness goals. Review goals when patient returns to cardiac rehab. Progress workloads as tolerated to help achieve personal health and fitness goals.             Nutrition & Weight - Outcomes:  Pre Biometrics - 07/16/20 1605       Pre Biometrics   Waist Circumference 52.25 inches    Hip Circumference 48 inches    Waist to Hip Ratio 1.09 %    Triceps Skinfold 24 mm    % Body Fat 38.2 %    Grip Strength 58 kg    Flexibility 2 in    Single Leg Stand 15 seconds              Nutrition:  Nutrition Therapy & Goals - 07/31/20 1211       Nutrition Therapy   Diet TLC; low sodium      Personal Nutrition Goals   Nutrition Goal Pt to identify and limit food sources of saturated fat, trans fat, refined carbohydrates and sodium    Personal Goal #2 Pt to  identify food quantities necessary to achieve weight loss of 6-24 lb at graduation from cardiac rehab.      Intervention Plan   Intervention Prescribe, educate and counsel regarding individualized specific dietary modifications aiming towards targeted core components such as weight, hypertension, lipid management, diabetes, heart failure and other comorbidities.    Expected Outcomes Short Term Goal: A plan has been developed with personal nutrition goals set during dietitian appointment.;Long Term Goal: Adherence to prescribed nutrition plan.             Nutrition Discharge:   Education Questionnaire Score:  Knowledge Questionnaire Score - 07/16/20 1633       Knowledge Questionnaire Score   Pre Score 21/24            Pt completed from cardiac rehab program on 09/27/20 with completion of 13  exercise sessions in Phase II. Pt maintained good attendance and progressed nicely during his participation in rehab as evidenced by increased MET level .  Pt has made significant lifestyle changes and should be commended for his success. Pt feels he has achieved his goals during cardiac rehab. Seldon only attended a few sessions after his stent placement but did report feeling better. Yama did not return to class to complete his post exercise walk test..Keone Kamer Venetia Maxon, RN,BSN 10/15/2020 8:28 AM

## 2020-10-30 ENCOUNTER — Ambulatory Visit (HOSPITAL_COMMUNITY)
Admission: RE | Admit: 2020-10-30 | Discharge: 2020-10-30 | Disposition: A | Discharge status: Home / Self Care | Payer: BC Managed Care – PPO | Source: Ambulatory Visit | Attending: Internal Medicine | Admitting: Internal Medicine

## 2020-10-30 ENCOUNTER — Other Ambulatory Visit: Payer: Self-pay

## 2020-10-30 VITALS — BP 150/104 | HR 63 | Ht 73.0 in | Wt 303.0 lb

## 2020-10-30 DIAGNOSIS — E669 Obesity, unspecified: Secondary | ICD-10-CM | POA: Insufficient documentation

## 2020-10-30 DIAGNOSIS — I871 Compression of vein: Secondary | ICD-10-CM | POA: Diagnosis not present

## 2020-10-30 DIAGNOSIS — I2510 Atherosclerotic heart disease of native coronary artery without angina pectoris: Secondary | ICD-10-CM | POA: Insufficient documentation

## 2020-10-30 DIAGNOSIS — E785 Hyperlipidemia, unspecified: Secondary | ICD-10-CM | POA: Insufficient documentation

## 2020-10-30 DIAGNOSIS — M109 Gout, unspecified: Secondary | ICD-10-CM | POA: Insufficient documentation

## 2020-10-30 DIAGNOSIS — I5022 Chronic systolic (congestive) heart failure: Secondary | ICD-10-CM | POA: Insufficient documentation

## 2020-10-30 DIAGNOSIS — G4733 Obstructive sleep apnea (adult) (pediatric): Secondary | ICD-10-CM | POA: Diagnosis not present

## 2020-10-30 DIAGNOSIS — Z7901 Long term (current) use of anticoagulants: Secondary | ICD-10-CM | POA: Insufficient documentation

## 2020-10-30 DIAGNOSIS — Z7902 Long term (current) use of antithrombotics/antiplatelets: Secondary | ICD-10-CM | POA: Diagnosis not present

## 2020-10-30 DIAGNOSIS — I480 Paroxysmal atrial fibrillation: Secondary | ICD-10-CM | POA: Diagnosis not present

## 2020-10-30 DIAGNOSIS — I110 Hypertensive heart disease with heart failure: Secondary | ICD-10-CM | POA: Diagnosis not present

## 2020-10-30 DIAGNOSIS — Z79899 Other long term (current) drug therapy: Secondary | ICD-10-CM | POA: Insufficient documentation

## 2020-10-30 DIAGNOSIS — Z7984 Long term (current) use of oral hypoglycemic drugs: Secondary | ICD-10-CM | POA: Insufficient documentation

## 2020-10-30 DIAGNOSIS — Z6839 Body mass index (BMI) 39.0-39.9, adult: Secondary | ICD-10-CM | POA: Diagnosis not present

## 2020-10-30 DIAGNOSIS — Z955 Presence of coronary angioplasty implant and graft: Secondary | ICD-10-CM | POA: Diagnosis not present

## 2020-10-30 NOTE — Progress Notes (Signed)
Advanced Heart Failure Clinic Note   PCP: Molli Barrows, MD [facility]: Dr. Haroldine Laws  EP: Dr. Quentin Ore   Reason for Visit: F/u for chronic systolic heart failure, PAF and CAD s/p PCI   HPI:  Alexander [name] is a 45 y.o. year old with systolic heart failure (presumed tachy-mediated), obesity, HTN, hyperlipidemia, adjustment disorder, anxiety, and A fib.  Presented to [facility] on 04/24/20 with palpitations and SOB in setting of heavy ETOH use EKG showed A Fib RVR. Started on diltiazem with plans for admit but he left AMA. Discharged on diltiazem + Xarelto.    Presented to [facility] ED on 04/25/20 with HF symptoms. EKG showed A Fib RVR. Admitted and started on diltiazem. Echo showed EF 35-40%. EP consulted. Started on metoprolol and losartan with plans to reassess in 3 weeks for possible cardioversion.    Sent back to [facility] ED from EP clinic on 05/13/20 with N/V and A fib RVR. On arrival he was cool and volume overloaed.  Started on amio drip + milrinone due to cardiogenic shock. Echo EF 10-15%. Diuresed with IV lasix and later stopped with addition of GDMT. After amio load had successful cardioversion. Discharged home on po amio with taper. All HF meds provided through TOC.    Had f/u 08/03/20 and was doing well. Echo was repeated, EF 45-50% RV ok. He was referred for [facility] on 08/17/20. The patient underwent cardiac cath with Dr. Haroldine Laws as and found to have  pLAD lesion of 70% and 80% ostial Lcx. Dr. Angelena Form preformed PCI/DESx1 to pLcx. Pressure wire analysis of the mLAD noting RFR is 0.93 suggesting mid LAD stenosis is not flow limiting. Plan for DAPT with ASA/plavix for at least one month as patient is also on Xarelto. Will plan to stop ASA after one month and continue plavix and Xarelto. 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 LVEDP only 4.   Since last visit, he also had sleep study c/w OSA. Dr. Radford Pax following. Awaiting CPAP.   He presents to clinic today for f/u. Doing  well. More active. Denies CP, SOB, orthopnea or PND. No palpitations. No bleeding on Plavix/Xarelto.  SBP at home 120-130s. Still struggling with gout.   Echo 12/21: LVEF 35-40%, RV ok  Echo 3/22: LVEF 45-50%, RV ok   LHC 08/17/20  Conclusion    Prox Cx lesion is 80% stenosed. Mid LAD lesion is 70% stenosed. A drug-eluting stent was successfully placed using a STENT RESOLUTE ONYX 4.0X26. Post intervention, there is a 0% residual stenosis.   1. Severe proximal Circumflex stenosis 2. Successful PTCA/DES x 1 proximal Circumflex 3. Pressure wire analysis of the mid LAD. RFR is 0.93 suggesting the mid stenosis is not flow limiting.    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 LVEDP only 4.    ROS: All systems reviewed and negative except as per HPI.   Past Medical History:  Diagnosis Date   Arrhythmia    Atrial fibrillation ([place])    CAD (coronary artery disease) 08/17/2020   PCI + DES to pLCx, 70% mLAD negative FFR testing (medical therapy)    CHF (congestive heart failure) (HCC)    Chronic systolic heart failure (HCC)    Gout    Hyperlipidemia    Hypertension    OSA (obstructive sleep apnea) 09/2020    Current Outpatient Medications  Medication Sig Dispense Refill   acetaminophen (TYLENOL) 500 MG tablet Take 500 mg by mouth every 6 (  six) hours as needed for moderate pain.     allopurinol (ZYLOPRIM) 100 MG tablet Take 100 mg by mouth daily.     amiodarone (PACERONE) 200 MG tablet Take 200 mg by mouth daily.     buPROPion (WELLBUTRIN XL) 300 MG 24 hr tablet Take 300 mg by mouth daily.     carvedilol (COREG) 6.25 MG tablet Take 1 tablet (6.25 mg total) by mouth 2 (two) times daily with a meal. 60 tablet 3   clonazePAM (KLONOPIN) 0.5 MG tablet Take 0.5 mg by mouth 2 (two) times daily as needed for anxiety.     clopidogrel (PLAVIX) 75 MG tablet Take 1 tablet (75 mg total) by mouth daily. 90 tablet 3   colchicine 0.6 MG tablet Take 0.6 mg by mouth 2 (two) times daily as  needed (gout).     dapagliflozin propanediol (FARXIGA) 10 MG TABS tablet Take 1 tablet (10 mg total) by mouth daily. 30 tablet 6   fenofibrate (TRICOR) 145 MG tablet Take 145 mg by mouth daily.     furosemide (LASIX) 20 MG tablet Take 1 tablet (20 mg total) by mouth as needed for fluid or edema. 30 tablet 11   Multiple Vitamin (MULTIVITAMIN WITH MINERALS) TABS tablet Take 1 tablet by mouth daily.     nitroGLYCERIN (NITROSTAT) 0.4 MG SL tablet Place 1 tablet (0.4 mg total) under the tongue every 5 (five) minutes as needed for chest pain. 25 tablet 0   potassium chloride SA (KLOR-CON) 20 MEQ tablet Take 1 tablet (20 mEq total) by mouth as needed (with Lasix). 90 tablet 3   rosuvastatin (CRESTOR) 40 MG tablet Take 1 tablet (40 mg total) by mouth daily. 30 tablet 11   sacubitril-valsartan (ENTRESTO) 97-103 MG Take 1 tablet by mouth 2 (two) times daily. 60 tablet 6   spironolactone (ALDACTONE) 25 MG tablet Take 1 tablet (25 mg total) by mouth daily. 30 tablet 6   traMADol (ULTRAM) 50 MG tablet Take 50 mg by mouth 3 (three) times daily as needed for moderate pain.     XARELTO 20 MG TABS tablet Take 1 tablet (20 mg total) by mouth daily. 30 tablet 3   No current facility-administered medications for this encounter.    Allergies  Allergen Reactions   Albuterol Anaphylaxis   Bee Venom Anaphylaxis   Metaproterenol Anaphylaxis   Other Anaphylaxis    DEET   Amoxicillin-Pot Clavulanate Itching    OK with Amoxicillin   Cephalexin Other (See Comments)    Chest tightness   Amoxicillin Itching and Rash    Itching all over       Social History   Socioeconomic History   Marital status: Divorced    Spouse name: Not on file   Number of children: 2   Years of education: 16   Highest education level: Bachelor's degree (e.g., BA, AB, BS)  Occupational History   Not on file  Tobacco Use   Smoking status: Never   Smokeless tobacco: Never  Substance and Sexual Activity   Alcohol use: Not Currently     Comment: stopped drinking in December 2021   Drug use: Not Currently   Sexual activity: Not on file  Other Topics Concern   Not on file  Social History Narrative   Not on file   Social Determinants of Health   Financial Resource Strain: Not on file  Food Insecurity: Not on file  Transportation Needs: Not on file  Physical Activity: Not on file  Stress: Not on  file  Social Connections: Not on file  Intimate Partner Violence: Not on file   Family History  Problem Relation Age of Onset   COPD Mother    Vitals:   10/30/20 1156  BP: (!) 150/104  Pulse: 63  SpO2: 95%  Weight: (!) 137.4 kg (303 lb)  Height: '6\' 1"'  (1.854 m)    Wt Readings from Last 3 Encounters:  10/30/20 (!) 137.4 kg (303 lb)  09/14/20 131.8 kg (290 lb 9.6 oz)  09/08/20 131.5 kg (290 lb)   PHYSICAL EXAM: General:  Well appearing. No resp difficulty HEENT: normal Neck: supple. no JVD. Carotids 2+ bilat; no bruits. No lymphadenopathy or thryomegaly appreciated. Cor: PMI nondisplaced. Regular rate & rhythm. No rubs, gallops or murmurs. Lungs: clear Abdomen: obese soft, nontender, nondistended. No hepatosplenomegaly. No bruits or masses. Good bowel sounds. Extremities: no cyanosis, clubbing, rash, edema Neuro: alert & orientedx3, cranial nerves grossly intact. moves all 4 extremities w/o difficulty. Affect pleasant    ASSESSMENT & PLAN:  1. Chronic Systolic Heart Failure - Echo 12/21 EF 35-40%, RV mildly reduced  - Admit (1/22) for a/c CHF>>cardiogenic shock in the setting of rapid afib  - Echo 1/22 EF <20%, RV mildly reduced - Echo 08/03/20 EF 45-50% RV ok.  - Suspect primarily tachy mediated given improving EF w/ restoration to NSR. Also possible component of ICM. Recent cath w/ 80% pLCx treated w/ PCI. He has 70% mLAD but not flow limiting by FFR testing.  - Doing well NYHA I. Volume status looks good.  - Continue lasix 20 mg prn.  - Continue Entresto 97-103 mg bid. - Continue Farxiga 10 mg  daily.  - Continue spiro 25 mg daily.  - Continue Carvedilol 6.25 mg bid.  - Lab today  2. CAD: LHC 08/17/20 70% mid LAD, non flow limiting by FFR anyalsis, 80% pLCx treated w/ PCI + DES. Medical management for LAD stenosis.  - No s/s angina - Continue Plavix + Xarelto. Can drop Plavix in 5/23 - C/w ? blocker + statin  - Has completed CR  3. PAF - Maintaining NSR post DCCV. - Continue amiodarone 200 mg daily. May be able to stop soon if no recurrence  - Continue Xarelto. - Stressed need to avoid ETOH - Sleep study + for OSA. Awaiting CPAP  - Labs today   5. Obesity Body mass index is 39.98 kg/m. - He has met with a nutritionist and is working on weight loss  6. OSA - + sleep study - planning CPAP, Dr. Radford Pax following   7. Gout - refer to Dr. Amil Amen ([facility])  Glori Bickers, MD 10/30/20

## 2020-10-30 NOTE — Patient Instructions (Signed)
You have been referred to Dr Dierdre Forth at [facility] for your gout, his office will call you for an appointment  Your physician recommends that you schedule a follow-up appointment in: 4 months with an echocardiogram  If you have any questions or concerns before your next appointment please send [place] a message through [place] or call our office at [phone].    TO LEAVE A MESSAGE FOR THE NURSE SELECT OPTION 2, PLEASE LEAVE A MESSAGE INCLUDING: YOUR NAME DATE OF BIRTH CALL BACK NUMBER REASON FOR CALL**this is important as we prioritize the call backs  YOU WILL RECEIVE A CALL BACK THE SAME DAY AS LONG AS YOU CALL BEFORE 4:00 PM  At the Advanced Heart Failure Clinic, you and your health needs are our priority. As part of our continuing mission to provide you with exceptional heart care, we have created designated Provider Care Teams. These Care Teams include your primary Cardiologist (physician) and Advanced Practice Providers (APPs- Physician Assistants and Nurse Practitioners) who all work together to provide you with the care you need, when you need it.   You may see any of the following providers on your designated Care Team at your next follow up: Dr Arvilla Meres Dr Marca Ancona Dr Brandon Melnick, NP Robbie Lis, [place] Mikki Santee Karle Plumber, PharmD   Please be sure to bring in all your medications bottles to every appointment.

## 2020-11-02 NOTE — Addendum Note (Signed)
Encounter addended by: Noralee Space, RN on: 11/02/2020 11:01 AM  Actions taken: Clinical Note Signed

## 2020-11-02 NOTE — Progress Notes (Signed)
Referral form, OV note, demographics, and insurance info all faxed to [facility]

## 2020-11-18 ENCOUNTER — Ambulatory Visit (INDEPENDENT_AMBULATORY_CARE_PROVIDER_SITE_OTHER): Payer: BC Managed Care – PPO | Admitting: Orthopaedic Surgery

## 2020-11-18 ENCOUNTER — Encounter: Payer: Self-pay | Admitting: Orthopaedic Surgery

## 2020-11-18 DIAGNOSIS — M67441 Ganglion, right hand: Secondary | ICD-10-CM | POA: Diagnosis not present

## 2020-11-18 NOTE — Progress Notes (Signed)
Office Visit Note   Patient: Alexander [name]           Date of Birth: 12/30/1975           MRN: [id number] Visit Date: 11/18/2020              Requested by: Halford Chessman, MD No address on file PCP: Halford Chessman, MD   Assessment & Plan: Visit Diagnoses:  1. Digital mucous cyst of finger of right hand     Plan: Impression is recurrent right ring finger cyst DIP joint.  We again discussed aspiration versus surgical excision.  The patient would like to proceed with aspiration for now and will likely follow-up over the next few months to further discuss incision of this cyst.  He will call with concerns or questions in the meantime.  Follow-Up Instructions: Return if symptoms worsen or fail to improve.   Orders:  Orders Placed This Encounter  Procedures   Hand/UE Inj    No orders of the defined types were placed in this encounter.     Procedures: Hand/UE Inj on 11/18/2020 9:00 AM Details: dorsal approach Medications: 1 mL lidocaine 1 %; 1 mL bupivacaine 0.25 %     Clinical Data: No additional findings.   Subjective: No chief complaint on file.   HPI patient is a pleasant 45 year old gentleman who comes in today with recurrent cyst formation to the dorsal aspect of the DIP joint right long finger.  We saw him for this a few months ago where it had previously ruptured.  We aspirated the remaining fluid and injected this with cortisone.  He notes that the cyst recently reformed.  Cyst is painful when bumping his hand on a hard object.  He is scheduled to go on a golf trip in 3 weeks and would like this reaspirated.  Of note, he has a history of CHF and CAD and was hospitalized with stent placement a few months back.  He is currently on Xarelto and Eliquis.  He is thinking about having this excised within the next few months.  Review of Systems as detailed in HPI.  All others reviewed and are negative.   Objective: Vital Signs: There were no vitals taken for this  visit.  Physical Exam well-developed and well-nourished gentleman in no acute distress.  Alert and oriented x3.  Ortho Exam examination of the right long finger reveals a pea-sized cyst to the dorsal aspect of the DIP joint.  This is minimally tender.  No erythema, no drainage or warmth.  Specialty Comments:  No specialty comments available.  Imaging: No new imaging   PMFS History: Patient Active Problem List   Diagnosis Date Noted   Chronic systolic heart failure (HCC)    Coronary artery disease involving native coronary artery of native heart without angina pectoris    Acute systolic heart failure (HCC) 05/13/2020   Acute on chronic systolic (congestive) heart failure (HCC) 05/13/2020   Atrial fibrillation with RVR (HCC) 04/26/2020   HLD (hyperlipidemia) 04/26/2020   HTN (hypertension) 04/26/2020   Depression 04/26/2020   Gout 04/26/2020   Panic attacks 04/26/2020   Low back pain 04/26/2020   Past Medical History:  Diagnosis Date   Arrhythmia    Atrial fibrillation (HCC)    CAD (coronary artery disease) 08/17/2020   PCI + DES to pLCx, 70% mLAD negative FFR testing (medical therapy)    CHF (congestive heart failure) (HCC)    Chronic systolic heart failure (HCC)    Gout  Hyperlipidemia    Hypertension    OSA (obstructive sleep apnea) 09/2020    Family History  Problem Relation Age of Onset   COPD Mother     Past Surgical History:  Procedure Laterality Date   CARDIOVERSION N/A 05/15/2020   Procedure: CARDIOVERSION;  Surgeon: Dolores Patty, MD;  Location: [facility] ENDOSCOPY;  Service: Cardiovascular;  Laterality: N/A;   CORONARY STENT INTERVENTION N/A 08/17/2020   Procedure: CORONARY STENT INTERVENTION;  Surgeon: Kathleene Hazel, MD;  Location: [facility] INVASIVE CV LAB;  Service: Cardiovascular;  Laterality: N/A;   RIGHT/LEFT HEART CATH AND CORONARY ANGIOGRAPHY N/A 08/17/2020   Procedure: RIGHT/LEFT HEART CATH AND CORONARY ANGIOGRAPHY;  Surgeon: Dolores Patty,  MD;  Location: [facility] INVASIVE CV LAB;  Service: Cardiovascular;  Laterality: N/A;   SEPTOPLASTY     Social History   Occupational History   Not on file  Tobacco Use   Smoking status: Never   Smokeless tobacco: Never  Substance and Sexual Activity   Alcohol use: Not Currently    Comment: stopped drinking in December 2021   Drug use: Not Currently   Sexual activity: Not on file

## 2020-11-19 MED ORDER — BUPIVACAINE HCL 0.25 % IJ SOLN
1.0000 mL | INTRAMUSCULAR | Status: AC | PRN
  Administered 2020-11-18: 1 mL

## 2020-11-19 MED ORDER — LIDOCAINE HCL 1 % IJ SOLN
1.0000 mL | INTRAMUSCULAR | Status: AC | PRN
  Administered 2020-11-18: 1 mL

## 2020-11-20 ENCOUNTER — Other Ambulatory Visit (HOSPITAL_COMMUNITY): Payer: Self-pay | Admitting: Internal Medicine

## 2020-12-28 ENCOUNTER — Encounter (HOSPITAL_COMMUNITY): Payer: Self-pay

## 2020-12-29 ENCOUNTER — Other Ambulatory Visit (HOSPITAL_COMMUNITY): Payer: Self-pay | Admitting: Cardiology

## 2020-12-31 DIAGNOSIS — M1A09X0 Idiopathic chronic gout, multiple sites, without tophus (tophi): Secondary | ICD-10-CM | POA: Diagnosis not present

## 2021-01-18 ENCOUNTER — Other Ambulatory Visit (HOSPITAL_COMMUNITY): Payer: Self-pay | Admitting: Internal Medicine

## 2021-01-21 ENCOUNTER — Other Ambulatory Visit (HOSPITAL_COMMUNITY): Payer: Self-pay | Admitting: Cardiology

## 2021-01-21 NOTE — Telephone Encounter (Signed)
Prescription refill request for Xarelto received.  Indication: Afib  Last office visit: 08/11/20 Lalla Brothers)  Weight: 137.4kg Age: 45 Scr: 1.33 (08/03/20) CrCl: 137.74  Appropriate dose and refill sent to requested pharmacy.

## 2021-01-25 DIAGNOSIS — R221 Localized swelling, mass and lump, neck: Secondary | ICD-10-CM | POA: Diagnosis not present

## 2021-01-25 DIAGNOSIS — M67441 Ganglion, right hand: Secondary | ICD-10-CM | POA: Diagnosis not present

## 2021-01-25 DIAGNOSIS — M67449 Ganglion, unspecified hand: Secondary | ICD-10-CM | POA: Diagnosis not present

## 2021-01-28 ENCOUNTER — Other Ambulatory Visit (HOSPITAL_COMMUNITY): Payer: Self-pay | Admitting: Cardiology

## 2021-03-01 ENCOUNTER — Other Ambulatory Visit: Payer: Self-pay

## 2021-03-01 ENCOUNTER — Encounter (HOSPITAL_COMMUNITY): Payer: Self-pay | Admitting: Internal Medicine

## 2021-03-01 ENCOUNTER — Ambulatory Visit (HOSPITAL_COMMUNITY)
Admission: RE | Admit: 2021-03-01 | Discharge: 2021-03-01 | Disposition: A | Discharge status: Home / Self Care | Payer: BC Managed Care – PPO | Source: Ambulatory Visit | Attending: Internal Medicine | Admitting: Internal Medicine

## 2021-03-01 ENCOUNTER — Ambulatory Visit (HOSPITAL_BASED_OUTPATIENT_CLINIC_OR_DEPARTMENT_OTHER)
Admission: RE | Admit: 2021-03-01 | Discharge: 2021-03-01 | Disposition: A | Discharge status: Home / Self Care | Payer: BC Managed Care – PPO | Source: Ambulatory Visit | Attending: Internal Medicine | Admitting: Internal Medicine

## 2021-03-01 VITALS — BP 160/90 | HR 65 | Wt 298.6 lb

## 2021-03-01 DIAGNOSIS — I10 Essential (primary) hypertension: Secondary | ICD-10-CM

## 2021-03-01 DIAGNOSIS — G4733 Obstructive sleep apnea (adult) (pediatric): Secondary | ICD-10-CM | POA: Diagnosis not present

## 2021-03-01 DIAGNOSIS — I5022 Chronic systolic (congestive) heart failure: Secondary | ICD-10-CM

## 2021-03-01 DIAGNOSIS — Z8249 Family history of ischemic heart disease and other diseases of the circulatory system: Secondary | ICD-10-CM | POA: Insufficient documentation

## 2021-03-01 DIAGNOSIS — Z9861 Coronary angioplasty status: Secondary | ICD-10-CM

## 2021-03-01 DIAGNOSIS — I2510 Atherosclerotic heart disease of native coronary artery without angina pectoris: Secondary | ICD-10-CM

## 2021-03-01 DIAGNOSIS — I480 Paroxysmal atrial fibrillation: Secondary | ICD-10-CM | POA: Diagnosis not present

## 2021-03-01 LAB — COMPREHENSIVE METABOLIC PANEL
ALT: 36 U/L (ref 0–44)
AST: 28 U/L (ref 15–41)
Albumin: 4.7 g/dL (ref 3.5–5.0)
Alkaline Phosphatase: 42 U/L (ref 38–126)
Anion gap: 11 (ref 5–15)
BUN: 21 mg/dL — ABNORMAL HIGH (ref 6–20)
CO2: 25 mmol/L (ref 22–32)
Calcium: 9.7 mg/dL (ref 8.9–10.3)
Chloride: 101 mmol/L (ref 98–111)
Creatinine, Ser: 1.33 mg/dL — ABNORMAL HIGH (ref 0.61–1.24)
GFR, Estimated: >60 mL/min (ref >60)
Glucose, Bld: 99 mg/dL (ref 70–99)
Potassium: 3.5 mmol/L (ref 3.5–5.1)
Sodium: 137 mmol/L (ref 135–145)
Total Bilirubin: 1 mg/dL (ref 0.3–1.2)
Total Protein: 7.5 g/dL (ref 6.5–8.1)

## 2021-03-01 LAB — CBC
HCT: 54.1 % — ABNORMAL HIGH (ref 39.0–52.0)
Hemoglobin: 17.8 g/dL — ABNORMAL HIGH (ref 13.0–17.0)
MCH: 29.8 pg (ref 26.0–34.0)
MCHC: 32.9 g/dL (ref 30.0–36.0)
MCV: 90.5 fL (ref 80.0–100.0)
Platelets: 285 10*3/uL (ref 150–400)
RBC: 5.98 MIL/uL — ABNORMAL HIGH (ref 4.22–5.81)
RDW: 12.7 % (ref 11.5–15.5)
WBC: 11.1 10*3/uL — ABNORMAL HIGH (ref 4.0–10.5)
nRBC: 0 % (ref 0.0–0.2)

## 2021-03-01 LAB — ECHOCARDIOGRAM COMPLETE
Area-P 1/2: 2.39 cm2
Calc EF: 47.5 %
S' Lateral: 4 cm
Single Plane A2C EF: 52.6 %
Single Plane A4C EF: 49.3 %

## 2021-03-01 LAB — TSH: TSH: 2.663 u[IU]/mL (ref 0.350–4.500)

## 2021-03-01 LAB — T4, FREE: Free T4: 0.88 ng/dL (ref 0.61–1.12)

## 2021-03-01 MED ORDER — DOXAZOSIN MESYLATE 1 MG PO TABS: 1.0000 mg | ORAL_TABLET | Freq: Every day | ORAL | 11 refills | Status: DC

## 2021-03-01 NOTE — Progress Notes (Signed)
Advanced Heart Failure Clinic Note   PCP: Molli Barrows, MD [facility]: Dr. Haroldine Laws  EP: Dr. Quentin Ore   Reason for Visit: F/u for chronic systolic heart failure, PAF and CAD s/p PCI   HPI:  Mr Alexander [name] is a 45 y.o. year old with systolic heart failure (presumed tachy-mediated), obesity, HTN, hyperlipidemia, adjustment disorder, anxiety, and A fib.  Presented to [facility] on 04/24/20 with palpitations and SOB in setting of heavy ETOH use EKG showed A Fib RVR. Started on diltiazem with plans for admit but he left AMA. Discharged on diltiazem + Xarelto.    Presented to [facility] ED on 04/25/20 with HF symptoms. EKG showed A Fib RVR. Admitted and started on diltiazem. Echo showed EF 35-40%. EP consulted. Started on metoprolol and losartan with plans to reassess in 3 weeks for possible cardioversion.    Sent back to [facility] ED from EP clinic on 05/13/20 with N/V and A fib RVR. On arrival he was cool and volume overloaed.  Started on amio drip + milrinone due to cardiogenic shock. Echo EF 10-15%. Diuresed with IV lasix and later stopped with addition of GDMT. After amio load had successful cardioversion.   Echo 3/22 EF 45-50% RV ok. R/LHC on 08/17/20. Found to have  pLAD lesion of 70% and 80% ostial Lcx. Dr. Angelena Form preformed PCI/DESx1 to pLcx. Pressure wire analysis of the mLAD noting RFR is 0.93 suggesting LAD stenosis is not flow limiting. 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 LVEDP only 4.   Since last visit, he also had sleep study c/w OSA. Dr. Radford Pax following. Started CPAP about 2 weeks ago.   He presents to clinic today for f/u. Doing well. CPAP really helping. Taking BP daily at home usually 135-140/90. Just occasional swelling when not following diet. Denies SOB, orthopnea or PND. Walking a couple miles per day. Went to [place] for 2 weeks and played golf walked 140 miles. No bleeding with Xarelto/Plavix  Echo today 03/01/21: EF 55-60% Personally reviewed   Echo 12/21: LVEF  35-40%, RV ok  Echo 3/22: LVEF 45-50%, RV ok   LHC 08/17/20  Conclusion    Prox Cx lesion is 80% stenosed. Mid LAD lesion is 70% stenosed. A drug-eluting stent was successfully placed using a STENT RESOLUTE ONYX 4.0X26. Post intervention, there is a 0% residual stenosis.   1. Severe proximal Circumflex stenosis 2. Successful PTCA/DES x 1 proximal Circumflex 3. Pressure wire analysis of the mid LAD. RFR is 0.93 suggesting the mid stenosis is not flow limiting.    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 LVEDP only 4.    ROS: All systems reviewed and negative except as per HPI.   Past Medical History:  Diagnosis Date   Arrhythmia    Atrial fibrillation ([place])    CAD (coronary artery disease) 08/17/2020   PCI + DES to pLCx, 70% mLAD negative FFR testing (medical therapy)    CHF (congestive heart failure) (HCC)    Chronic systolic heart failure (HCC)    Gout    Hyperlipidemia    Hypertension    OSA (obstructive sleep apnea) 09/2020    Current Outpatient Medications  Medication Sig Dispense Refill   acetaminophen (TYLENOL) 500 MG tablet Take 500 mg by mouth every 6 (six) hours as needed for moderate pain.     allopurinol (ZYLOPRIM) 100 MG tablet Take 100 mg by mouth daily.     amiodarone (PACERONE) 200 MG tablet Take 200 mg by mouth  daily.     buPROPion (WELLBUTRIN XL) 300 MG 24 hr tablet Take 300 mg by mouth daily.     carvedilol (COREG) 6.25 MG tablet TAKE 1 TABLET BY MOUTH 2 TIMES DAILY WITH A MEAL. 180 tablet 3   clonazePAM (KLONOPIN) 0.5 MG tablet Take 0.5 mg by mouth 2 (two) times daily as needed for anxiety.     clopidogrel (PLAVIX) 75 MG tablet Take 1 tablet (75 mg total) by mouth daily. 90 tablet 3   colchicine 0.6 MG tablet Take 0.6 mg by mouth 2 (two) times daily as needed (gout).     ENTRESTO 97-103 MG TAKE 1 TABLET BY MOUTH TWICE A DAY 60 tablet 6   FARXIGA 10 MG TABS tablet TAKE 1 TABLET BY MOUTH EVERY DAY 30 tablet 6   fenofibrate (TRICOR) 145 MG tablet  Take 145 mg by mouth daily.     furosemide (LASIX) 20 MG tablet Take 1 tablet (20 mg total) by mouth as needed for fluid or edema. 30 tablet 11   Multiple Vitamin (MULTIVITAMIN WITH MINERALS) TABS tablet Take 1 tablet by mouth daily.     nitroGLYCERIN (NITROSTAT) 0.4 MG SL tablet Place 1 tablet (0.4 mg total) under the tongue every 5 (five) minutes as needed for chest pain. 25 tablet 0   potassium chloride SA (KLOR-CON) 20 MEQ tablet Take 1 tablet (20 mEq total) by mouth as needed (with Lasix). 90 tablet 3   rosuvastatin (CRESTOR) 40 MG tablet Take 1 tablet (40 mg total) by mouth daily. 30 tablet 11   spironolactone (ALDACTONE) 25 MG tablet TAKE 1 TABLET (25 MG TOTAL) BY MOUTH DAILY. 30 tablet 6   traMADol (ULTRAM) 50 MG tablet Take 50 mg by mouth 3 (three) times daily as needed for moderate pain.     XARELTO 20 MG TABS tablet TAKE 1 TABLET BY MOUTH EVERY DAY 30 tablet 3   No current facility-administered medications for this encounter.    Allergies  Allergen Reactions   Albuterol Anaphylaxis   Bee Venom Anaphylaxis   Metaproterenol Anaphylaxis   Other Anaphylaxis    DEET   Amoxicillin-Pot Clavulanate Itching    OK with Amoxicillin   Cephalexin Other (See Comments)    Chest tightness   Amoxicillin Itching and Rash    Itching all over       Social History   Socioeconomic History   Marital status: Divorced    Spouse name: Not on file   Number of children: 2   Years of education: 16   Highest education level: Bachelor's degree (e.g., BA, AB, BS)  Occupational History   Not on file  Tobacco Use   Smoking status: Never   Smokeless tobacco: Never  Substance and Sexual Activity   Alcohol use: Not Currently    Comment: stopped drinking in December 2021   Drug use: Not Currently   Sexual activity: Not on file  Other Topics Concern   Not on file  Social History Narrative   Not on file   Social Determinants of Health   Financial Resource Strain: Not on file  Food  Insecurity: Not on file  Transportation Needs: Not on file  Physical Activity: Not on file  Stress: Not on file  Social Connections: Not on file  Intimate Partner Violence: Not on file   Family History  Problem Relation Age of Onset   COPD Mother    Vitals:   03/01/21 1021  BP: (!) 160/90  Pulse: 65  SpO2: 95%  Weight:  135.4 kg (298 lb 9.6 oz)    Wt Readings from Last 3 Encounters:  03/01/21 135.4 kg (298 lb 9.6 oz)  10/30/20 (!) 137.4 kg (303 lb)  09/14/20 131.8 kg (290 lb 9.6 oz)   PHYSICAL EXAM: General:  Well appearing. No resp difficulty HEENT: normal Neck: supple. no JVD. Carotids 2+ bilat; no bruits. No lymphadenopathy or thryomegaly appreciated. Cor: PMI nondisplaced. Regular rate & rhythm. No rubs, gallops or murmurs. Lungs: clear Abdomen: obese soft, nontender, nondistended. No hepatosplenomegaly. No bruits or masses. Good bowel sounds. Extremities: no cyanosis, clubbing, rash, edema Neuro: alert & orientedx3, cranial nerves grossly intact. moves all 4 extremities w/o difficulty. Affect pleasant   ASSESSMENT & PLAN:  1. Chronic Systolic Heart Failure - Echo 12/21 EF 35-40%, RV mildly reduced  - Admit (1/22) for a/c CHF>>cardiogenic shock in the setting of rapid afib  - Echo 1/22 EF <20%, RV mildly reduced - Echo 08/03/20 EF 45-50% RV ok.  - Echo today 03/01/21: EF 55-60% RV normal  - Suspect primarily tachy mediated  - Doing well NYHA I Volume looks good - Continue lasix 20 mg prn.  - Continue Entresto 97-103 mg bid. - Continue Farxiga 10 mg daily.  - Continue spiro 25 mg daily.  - Continue Carvedilol 6.25 mg bid.  - Labs today  2. CAD: LHC 08/17/20 70% mid LAD, non flow limiting by FFR anyalsis, 80% pLCx treated w/ PCI + DES. Medical management for LAD stenosis.  - No s/s angina - Continue Plavix + Xarelto. Can drop Plavix in 5/23 - C/w ? blocker + statin  - Has completed CR  3. PAF - Maintaining NSR post DCCV. - Continue amiodarone 200 mg daily.  May be able to stop soon if no recurrence  - Continue Xarelto. - Stressed need to avoid ETOH and get weight down - Continue CPAP  5. Obesity Body mass index is 39.4 kg/m. - He has met with a nutritionist and is working on weight loss  6. OSA - continue CPAP  7. Gout - Followed by Dr. Amil Amen ([facility])  8. HTN - BP elevated - Add doxazosin 61m qhs - Continue weight loss efforts  DGlori Bickers MD 03/01/21

## 2021-03-01 NOTE — Patient Instructions (Signed)
Start Doxazosin 1 mg Daily at Bedtime  Labs done today, your results will be available in MyChart, we will contact you for abnormal readings.  Your physician recommends that you schedule a follow-up appointment in: 9 months (July 2023), **PLEASE CALL OUR OFFICE IN MAY TO SCHEDULE THIS APPOINTMENT  If you have any questions or concerns before your next appointment please send [place] a message through Anna or call our office at [phone].    TO LEAVE A MESSAGE FOR THE NURSE SELECT OPTION 2, PLEASE LEAVE A MESSAGE INCLUDING: YOUR NAME DATE OF BIRTH CALL BACK NUMBER REASON FOR CALL**this is important as we prioritize the call backs  YOU WILL RECEIVE A CALL BACK THE SAME DAY AS LONG AS YOU CALL BEFORE 4:00 PM  At the [facility], you and your health needs are our priority. As part of our continuing mission to provide you with exceptional heart care, we have created designated Provider Care Teams. These Care Teams include your primary Cardiologist (physician) and Advanced Practice Providers (APPs- Physician Assistants and Nurse Practitioners) who all work together to provide you with the care you need, when you need it.   You may see any of the following providers on your designated Care Team at your next follow up: Dr Arvilla Meres Dr Carron Curie, NP Robbie Lis, [place] [facility] [place], [place] Karle Plumber, PharmD   Please be sure to bring in all your medications bottles to every appointment.

## 2021-03-01 NOTE — Addendum Note (Signed)
Encounter addended by: Noralee Space, RN on: 03/01/2021 11:03 AM  Actions taken: Diagnosis association updated, Pharmacy for encounter modified, Order list changed, Clinical Note Signed, Charge Capture section accepted

## 2021-03-02 LAB — T3, FREE: T3, Free: 2.4 pg/mL (ref 2.0–4.4)

## 2021-03-25 DIAGNOSIS — G4733 Obstructive sleep apnea (adult) (pediatric): Secondary | ICD-10-CM | POA: Diagnosis not present

## 2021-04-24 ENCOUNTER — Other Ambulatory Visit (HOSPITAL_COMMUNITY): Payer: Self-pay | Admitting: Cardiology

## 2021-04-24 DIAGNOSIS — G4733 Obstructive sleep apnea (adult) (pediatric): Secondary | ICD-10-CM | POA: Diagnosis not present

## 2021-05-21 ENCOUNTER — Other Ambulatory Visit (HOSPITAL_COMMUNITY): Payer: Self-pay

## 2021-05-21 ENCOUNTER — Encounter (HOSPITAL_COMMUNITY): Payer: Self-pay | Admitting: Internal Medicine

## 2021-05-21 ENCOUNTER — Other Ambulatory Visit: Payer: Self-pay

## 2021-05-21 MED ORDER — RIVAROXABAN 20 MG PO TABS: 20.0000 mg | ORAL_TABLET | Freq: Every day | ORAL | 1 refills | Status: DC

## 2021-05-21 MED ORDER — ENTRESTO 97-103 MG PO TABS: 1.0000 | ORAL_TABLET | Freq: Two times a day (BID) | ORAL | 11 refills | Status: DC

## 2021-05-21 MED ORDER — ROSUVASTATIN CALCIUM 40 MG PO TABS: 40.0000 mg | ORAL_TABLET | Freq: Every day | ORAL | 11 refills | Status: DC

## 2021-05-21 MED ORDER — DOXAZOSIN MESYLATE 1 MG PO TABS: 1.0000 mg | ORAL_TABLET | Freq: Every day | ORAL | 11 refills | Status: DC

## 2021-05-21 MED ORDER — SPIRONOLACTONE 25 MG PO TABS: 25.0000 mg | ORAL_TABLET | Freq: Every day | ORAL | 11 refills | Status: DC

## 2021-05-21 NOTE — Telephone Encounter (Signed)
Xarelto 20 mg refill request received. Pt is 46 years old, weight-  135.4 kg, Crea- 1.33 on 03/01/21, last seen by Dr. Gala Romney on 03/01/21, Diagnosis- PAF, CrCl- 135.74; Dose is appropriate based on dosing criteria. Will send in refill to requested pharmacy.

## 2021-05-25 ENCOUNTER — Telehealth: Payer: Self-pay | Admitting: Student-PharmD

## 2021-05-25 ENCOUNTER — Other Ambulatory Visit (HOSPITAL_COMMUNITY): Payer: Self-pay

## 2021-05-25 DIAGNOSIS — G4733 Obstructive sleep apnea (adult) (pediatric): Secondary | ICD-10-CM | POA: Diagnosis not present

## 2021-05-25 MED ORDER — SEMAGLUTIDE-WEIGHT MANAGEMENT 0.25 MG/0.5ML ~~LOC~~ SOAJ: 0.2500 mg | SUBCUTANEOUS | 0 refills | Status: AC

## 2021-05-25 MED ORDER — SEMAGLUTIDE-WEIGHT MANAGEMENT 1 MG/0.5ML ~~LOC~~ SOAJ: 1.0000 mg | SUBCUTANEOUS | 0 refills | Status: DC

## 2021-05-25 MED ORDER — SEMAGLUTIDE-WEIGHT MANAGEMENT 1.7 MG/0.75ML ~~LOC~~ SOAJ: 1.7000 mg | SUBCUTANEOUS | 0 refills | Status: DC

## 2021-05-25 MED ORDER — SEMAGLUTIDE-WEIGHT MANAGEMENT 2.4 MG/0.75ML ~~LOC~~ SOAJ: 2.4000 mg | SUBCUTANEOUS | 11 refills | Status: DC

## 2021-05-25 MED ORDER — SEMAGLUTIDE-WEIGHT MANAGEMENT 0.5 MG/0.5ML ~~LOC~~ SOAJ: 0.5000 mg | SUBCUTANEOUS | 0 refills | Status: AC

## 2021-05-25 NOTE — Telephone Encounter (Signed)
Received referral from Dr. Gala Romney to start semaglutide for this patient for weight loss. They meet FDA approved criteria for semaglutide for use in obesity given BMI 39.4. Obesity is complicated by chronic conditions including HTN, HF, CAD, afib, HLD. No contraindications seen in the chart to [facility] use.   PA for [facility] submitted and approved through 11/21/21.   Called patient to discuss starting Wegovy. Unable to reach patient, left voicemail requesting call back. Will need to schedule for initial visit with [facility] pharmacists for initial injection, counseling, and baseline labs.

## 2021-05-25 NOTE — Telephone Encounter (Signed)
Patient returned call. Discussed administration and common adverse effects with [id number]. He confirmed no personal or family history of MTC or MEN2. He would like to move forward with starting [facility] so I have sent it to his pharmacy for the full titration panel. Explained that if his copay is more than $25/month he can sign up for a copay card online to bring the cost down to that.   Scheduled for first appt on 06/09/21. He will need an updated lipid panel and A1c.

## 2021-06-03 ENCOUNTER — Encounter (HOSPITAL_COMMUNITY): Payer: Self-pay

## 2021-06-03 ENCOUNTER — Other Ambulatory Visit: Payer: Self-pay

## 2021-06-03 ENCOUNTER — Emergency Department (HOSPITAL_COMMUNITY): Payer: BC Managed Care – PPO

## 2021-06-03 ENCOUNTER — Emergency Department (HOSPITAL_COMMUNITY)
Admission: EM | Admit: 2021-06-03 | Discharge: 2021-06-03 | Disposition: A | Discharge status: Home / Self Care | Payer: BC Managed Care – PPO | Attending: Emergency Medicine | Admitting: Emergency Medicine

## 2021-06-03 DIAGNOSIS — I509 Heart failure, unspecified: Secondary | ICD-10-CM | POA: Diagnosis not present

## 2021-06-03 DIAGNOSIS — I4891 Unspecified atrial fibrillation: Secondary | ICD-10-CM | POA: Insufficient documentation

## 2021-06-03 DIAGNOSIS — Z79899 Other long term (current) drug therapy: Secondary | ICD-10-CM | POA: Diagnosis not present

## 2021-06-03 DIAGNOSIS — I110 Hypertensive heart disease with heart failure: Secondary | ICD-10-CM | POA: Diagnosis not present

## 2021-06-03 DIAGNOSIS — I10 Essential (primary) hypertension: Secondary | ICD-10-CM | POA: Diagnosis not present

## 2021-06-03 DIAGNOSIS — Z794 Long term (current) use of insulin: Secondary | ICD-10-CM | POA: Insufficient documentation

## 2021-06-03 DIAGNOSIS — R519 Headache, unspecified: Secondary | ICD-10-CM | POA: Diagnosis not present

## 2021-06-03 DIAGNOSIS — Z7901 Long term (current) use of anticoagulants: Secondary | ICD-10-CM | POA: Insufficient documentation

## 2021-06-03 DIAGNOSIS — R42 Dizziness and giddiness: Secondary | ICD-10-CM | POA: Diagnosis not present

## 2021-06-03 LAB — CBC
HCT: 48.3 % (ref 39.0–52.0)
Hemoglobin: 16.3 g/dL (ref 13.0–17.0)
MCH: 29.9 pg (ref 26.0–34.0)
MCHC: 33.7 g/dL (ref 30.0–36.0)
MCV: 88.5 fL (ref 80.0–100.0)
Platelets: 266 10*3/uL (ref 150–400)
RBC: 5.46 MIL/uL (ref 4.22–5.81)
RDW: 12 % (ref 11.5–15.5)
WBC: 9.5 10*3/uL (ref 4.0–10.5)
nRBC: 0 % (ref 0.0–0.2)

## 2021-06-03 LAB — BASIC METABOLIC PANEL
Anion gap: 9 (ref 5–15)
BUN: 14 mg/dL (ref 6–20)
CO2: 26 mmol/L (ref 22–32)
Calcium: 9.7 mg/dL (ref 8.9–10.3)
Chloride: 103 mmol/L (ref 98–111)
Creatinine, Ser: 1.29 mg/dL — ABNORMAL HIGH (ref 0.61–1.24)
GFR, Estimated: >60 mL/min (ref >60)
Glucose, Bld: 104 mg/dL — ABNORMAL HIGH (ref 70–99)
Potassium: 3.9 mmol/L (ref 3.5–5.1)
Sodium: 138 mmol/L (ref 135–145)

## 2021-06-03 LAB — PROTIME-INR
INR: 2 — ABNORMAL HIGH (ref 0.8–1.2)
Prothrombin Time: 23 seconds — ABNORMAL HIGH (ref 11.4–15.2)

## 2021-06-03 LAB — TROPONIN I (HIGH SENSITIVITY)
Troponin I (High Sensitivity): 10 ng/L (ref <18)
Troponin I (High Sensitivity): 10 ng/L (ref <18)

## 2021-06-03 LAB — APTT: aPTT: 39 seconds — ABNORMAL HIGH (ref 24–36)

## 2021-06-03 MED ORDER — HYDRALAZINE HCL 50 MG PO TABS: 50.0000 mg | ORAL_TABLET | Freq: Three times a day (TID) | ORAL | 0 refills | Status: DC

## 2021-06-03 MED ORDER — HYDRALAZINE HCL 25 MG PO TABS
50.0000 mg | ORAL_TABLET | Freq: Once | ORAL | Status: AC
  Administered 2021-06-03: 50 mg via ORAL
  Filled 2021-06-03: qty 2

## 2021-06-03 NOTE — Discharge Instructions (Signed)
Check your blood pressure twice a day, the morning and at night.  If your systolic pressure is above 180 mmhg (top number) or your diastolic pressure is above 100 mmhg (bottom number), you can take hydralazine, 50 mg.  You can use this up to 3 times a day as needed, or once every 8 hours.  Please call and follow-up with your cardiologist for your persistent high blood pressure.

## 2021-06-03 NOTE — ED Provider Triage Note (Signed)
Emergency Medicine Provider Triage Evaluation Note  Alexander [name] , [name] 46 y.o. male  was evaluated in triage.  Pt complains of elevated blood pressure at home onset today prior to arrival.  He notes that his blood pressure was elevated 190/120 at home.  Has associated dizziness with standing and walking. He has tried Coricidin HBP at home for his nasal congestion which helped his symptoms. Denies shortness of breath, chest pain, vision changes, fever, chills, nausea, vomiting, abdominal pain.  Patient has history of [name] stent placed last year, his cardiologist is Dr. Gala [name].  Denies past medical history of MI, CAD, diabetes.  Has [name] history of hypertension and treated with medications.  He is currently anticoagulated due to history of [name]. fib.  Review of Systems  Positive: As per HPI above. Negative: Fever, chills  Physical Exam  BP (!) 196/126 (BP Location: Right Arm)    Pulse 76    Temp 98.2 F (36.8 C)    Resp 18    Ht 6\' 1"  (1.854 m)    Wt 135.4 kg    SpO2 96%    BMI 39.38 kg/m  Gen:   Awake, no distress   Resp:  Normal effort  MSK:   Moves extremities without difficulty  Other:  No chest wall tenderness to palpation.  No focal neurological deficit.  Medical Decision Making  Medically screening exam initiated at 6:22 PM.  Appropriate orders placed.  Alexander [name] was informed that the remainder of the evaluation will be completed by another provider, this initial triage assessment does not replace that evaluation, and the importance of remaining in the ED until their evaluation is complete.    Alexander Marzan A, PA-C 06/03/21 1831

## 2021-06-03 NOTE — ED Provider Notes (Signed)
[facility] [address] EMERGENCY DEPARTMENT Provider Note   CSN: [id number] Arrival date & time: 06/03/21  1719     History  Chief Complaint  Patient presents with   Hypertension    Alexander [name] is a 46 y.o. male w/ hx of parox a fib on xarelto, significant HTN on multiple meds, CHF (improved on recent echo), presenting to ED with complaint of high BP and headache.  Patient reports he was on vacation in [place] last week with his significant other, and they ate "a ton of salty food and alcohol."  He noted his BP has been spiking all week, normally runs 140 mhg systolic, now 180's.  Today he woke up with a frontal headache, which feels like his recurring sinus headaches (he has had sinus surgery in the past); located in his maxilla bilaterally and behind his eyes.  He took OTC sinus medications, which improved the headache, but then began having very high BP, reporting 200/125mmhg at home, and felt lightheaded.  Denies chest pain or SOB.  He's been compliant with all his meds, including his evening meds which he took early at 5 pm before coming to the ED.  He sees Dr Teressa Lower from cardiology.  BP and cardiac meds: Entresto, spironolactone, Cardura, Coreg, amiodarone, xarelto, plavix, lasix   HPI     Home Medications Prior to Admission medications   Medication Sig Start Date End Date Taking? Authorizing Provider  hydrALAZINE (APRESOLINE) 50 MG tablet Take 1 tablet (50 mg total) by mouth 3 (three) times daily for 30 doses. 06/03/21 06/13/21 Yes Journei Thomassen, Kermit Balo, MD  acetaminophen (TYLENOL) 500 MG tablet Take 500 mg by mouth every 6 (six) hours as needed for moderate pain.    [provider]  allopurinol (ZYLOPRIM) 100 MG tablet Take 100 mg by mouth daily. 02/13/20   [provider]  amiodarone (PACERONE) 200 MG tablet Take 200 mg by mouth daily.    [provider]  buPROPion (WELLBUTRIN XL) 300 MG 24 hr tablet Take 300 mg by mouth daily. 02/13/20   [provider]  carvedilol (COREG) 6.25 MG tablet TAKE 1 TABLET BY MOUTH 2 TIMES DAILY WITH A MEAL. 11/20/20   Bensimhon, Bevelyn Buckles, MD  clonazePAM (KLONOPIN) 0.5 MG tablet Take 0.5 mg by mouth 2 (two) times daily as needed for anxiety. 11/27/19   [provider]  clopidogrel (PLAVIX) 75 MG tablet Take 1 tablet (75 mg total) by mouth daily. 09/18/20 09/18/21  Robbie Lis M, PA-C  colchicine 0.6 MG tablet Take 0.6 mg by mouth 2 (two) times daily as needed (gout). 02/28/20   [provider]  doxazosin (CARDURA) 1 MG tablet Take 1 tablet (1 mg total) by mouth at bedtime. 05/21/21   Bensimhon, Bevelyn Buckles, MD  FARXIGA 10 MG TABS tablet TAKE 1 TABLET BY MOUTH EVERY DAY 12/29/20   Robbie Lis M, PA-C  fenofibrate (TRICOR) 145 MG tablet Take 145 mg by mouth daily. 02/13/20   [provider]  furosemide (LASIX) 20 MG tablet TAKE 1 TABLET BY MOUTH AS NEEDED FOR FLUID OR EDEMA 04/26/21   Cannon Kettle, PA-C  Multiple Vitamin (MULTIVITAMIN WITH MINERALS) TABS tablet Take 1 tablet by mouth daily.    [provider]  nitroGLYCERIN (NITROSTAT) 0.4 MG SL tablet Place 1 tablet (0.4 mg total) under the tongue every 5 (five) minutes as needed for chest pain. 08/17/20 08/17/21  Bensimhon, Bevelyn Buckles, MD  potassium chloride SA (KLOR-CON) 20 MEQ tablet Take 1 tablet (20  mEq total) by mouth as needed (with Lasix). 05/26/20   Allayne ButcherSimmons, Brittainy M, PA-C  rivaroxaban (XARELTO) 20 MG TABS tablet Take 1 tablet (20 mg total) by mouth daily. 05/21/21   Lanier PrudeLambert, Cameron T, MD  rosuvastatin (CRESTOR) 40 MG tablet Take 1 tablet (40 mg total) by mouth daily. 05/21/21   Robbie LisSimmons, Brittainy M, PA-C  sacubitril-valsartan (ENTRESTO) 97-103 MG Take 1 tablet by mouth 2 (two) times daily. 05/21/21   Allayne ButcherSimmons, Brittainy M, PA-C  Semaglutide-Weight Management 0.25 MG/0.5ML SOAJ Inject 0.25 mg into the skin once a week for 28 days. 05/25/21 06/22/21  Bensimhon, Bevelyn Bucklesaniel R, MD  Semaglutide-Weight Management 0.5  MG/0.5ML SOAJ Inject 0.5 mg into the skin once a week for 28 days. 06/23/21 07/21/21  Bensimhon, Bevelyn Bucklesaniel R, MD  Semaglutide-Weight Management 1 MG/0.5ML SOAJ Inject 1 mg into the skin once a week for 28 days. 07/22/21 08/19/21  Bensimhon, Bevelyn Bucklesaniel R, MD  Semaglutide-Weight Management 1.7 MG/0.75ML SOAJ Inject 1.7 mg into the skin once a week for 28 days. 08/20/21 09/17/21  Bensimhon, Bevelyn Bucklesaniel R, MD  Semaglutide-Weight Management 2.4 MG/0.75ML SOAJ Inject 2.4 mg into the skin once a week for 28 days. 09/18/21 10/16/21  Bensimhon, Bevelyn Bucklesaniel R, MD  spironolactone (ALDACTONE) 25 MG tablet Take 1 tablet (25 mg total) by mouth daily. 05/21/21   Robbie LisSimmons, Brittainy M, PA-C  topiramate (TOPAMAX) 25 MG tablet Take 25 mg by mouth 2 (two) times daily. 04/12/21   [provider]  traMADol (ULTRAM) 50 MG tablet Take 50 mg by mouth 3 (three) times daily as needed for moderate pain. 02/13/20   [provider]  digoxin (LANOXIN) 0.125 MG tablet Take 1 tablet (0.125 mg total) by mouth daily. 06/15/20 08/03/20  Allayne ButcherSimmons, Brittainy M, PA-C      Allergies    Albuterol, Bee venom, Metaproterenol, Other, Amoxicillin-pot clavulanate, Cephalexin, and Amoxicillin    Review of Systems   Review of Systems  Physical Exam Updated Vital Signs BP (!) 147/98    Pulse 78    Temp 98.4 F (36.9 C) (Oral)    Resp (!) 21    Ht 6\' 1"  (1.854 m)    Wt 135.4 kg    SpO2 95%    BMI 39.38 kg/m  Physical Exam Constitutional:      General: He is not in acute distress.    Appearance: He is obese.  HENT:     Head: Normocephalic and atraumatic.  Eyes:     Conjunctiva/sclera: Conjunctivae normal.     Pupils: Pupils are equal, round, and reactive to light.  Cardiovascular:     Rate and Rhythm: Normal rate and regular rhythm.  Pulmonary:     Effort: Pulmonary effort is normal. No respiratory distress.  Abdominal:     General: There is no distension.     Tenderness: There is no abdominal tenderness.  Skin:    General: Skin is warm and  dry.  Neurological:     General: No focal deficit present.     Mental Status: He is alert and oriented to person, place, and time. Mental status is at baseline.  Psychiatric:        Mood and Affect: Mood normal.        Behavior: Behavior normal.    ED Results / Procedures / Treatments   Labs (all labs ordered are listed, but only abnormal results are displayed) Labs Reviewed  BASIC METABOLIC PANEL - Abnormal; Notable for the following components:      Result Value   Glucose,  Bld 104 (*)    Creatinine, Ser 1.29 (*)    All other components within normal limits  APTT - Abnormal; Notable for the following components:   aPTT 39 (*)    All other components within normal limits  PROTIME-INR - Abnormal; Notable for the following components:   Prothrombin Time 23.0 (*)    INR 2.0 (*)    All other components within normal limits  CBC  TROPONIN I (HIGH SENSITIVITY)  TROPONIN I (HIGH SENSITIVITY)    EKG EKG Interpretation  Date/Time:  Thursday June 03 2021 18:06:11 EST Ventricular Rate:  70 PR Interval:  186 QRS Duration: 90 QT Interval:  416 QTC Calculation: 449 R Axis:   84 Text Interpretation: Normal sinus rhythm Inferior infarct , age undetermined Possible Anterior infarct , age undetermined Abnormal ECG When compared with ECG of 14-Sep-2020 10:56, PREVIOUS ECG IS PRESENT Confirmed by Alvester Chourifan, Jamaal Bernasconi [phone]([id number]) on 06/03/2021 10:05:42 PM  Radiology DG Chest 1 View  Result Date: 06/03/2021 CLINICAL DATA:  Hypertension EXAM: CHEST  1 VIEW COMPARISON:  05/13/2020 FINDINGS: Heart size upper limits of normal. Mediastinal shadows are normal. The lungs are clear. The vascularity is normal. No effusions. No acute bone finding. IMPRESSION: No active disease. Electronically Signed   By: Paulina FusiMark  Shogry M.D.   On: 06/03/2021 19:20   CT Head Wo Contrast  Result Date: 06/03/2021 CLINICAL DATA:  Headache, elevated blood pressure since waking up this morning, generalized dizziness, history  atrial fibrillation EXAM: CT HEAD WITHOUT CONTRAST TECHNIQUE: Contiguous axial images were obtained from the base of the skull through the vertex without intravenous contrast. RADIATION DOSE REDUCTION: This exam was performed according to the departmental dose-optimization program which includes automated exposure control, adjustment of the mA and/or kV according to patient size and/or use of iterative reconstruction technique. COMPARISON:  None FINDINGS: Brain: Normal ventricular morphology. No midline shift or mass effect. Normal appearance of brain parenchyma. No intracranial hemorrhage, mass lesion, evidence of acute infarction, or extra-axial fluid collection. Vascular: No hyperdense vessels Skull: Intact Sinuses/Orbits: Clear Other: N/A IMPRESSION: Normal exam. Electronically Signed   By: Ulyses SouthwardMark  Boles M.D.   On: 06/03/2021 19:14    Procedures Procedures    Medications Ordered in ED Medications  hydrALAZINE (APRESOLINE) tablet 50 mg (50 mg Oral Given 06/03/21 2246)    ED Course/ Medical Decision Making/ A&P                           Medical Decision Making Risk Prescription drug management.   This patient presents to the ED with concern for headache, hypertension. This involves an extensive number of treatment options, and is a complaint that carries with it a high risk of complications and morbidity.  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  The pertinent results include:  Cr at baseline 1.3, CBC unremarkable, Trop 10->10  I ordered imaging studies including dg chest, CTH I  independently visualized and interpreted imaging which showed no focal findings; specifically no evidence of ICH, PNA, PTX I agree with the radiologist interpretation  The patient was maintained on a cardiac monitor.  I personally viewed and interpreted the cardiac monitored which showed an  underlying rhythm of: sinus rhythm  Per my interpretation the patient's ECG shows NSR, no sig changes from prior tracing, no STEMI  I ordered medication including hydralazine for persistent hypertension I have reviewed the patients home medicines and have made adjustments as needed  Test Considered:  - Lower suspicion for [facility] at this time, as his headache clinically fits the pattern of sinusitis (and temporarily went away this afternoon); no thunderclap onset; and he has mild symptoms at this time.  I did not feel emergent LP was necessary at this time  After the interventions noted above, I reevaluated the patient and found that they have: improved - BP improved  At this time lower suspicion for hypertensive emergency - no evidence of confusion/encephalopathy or visual deficits; no evidence of ACS/dissection, or acute kidney injury.  I do not feel the patient requires hospitalization for BP control, and feel this could be managed appropriately as an outpatient.   Dispostion:  After consideration of the diagnostic results and the patients response to treatment, I feel that the patent would benefit from close outpatient f/u. I suspect his BP spiked in the setting of his large salt intake (dietary) and his sinusitis headache pain today.  I advised we provide hydralazine as a PRN medication for HTN, and he keeps a daily journal at home to bring to the cardiologist.  I emphasized outpatient f/u.  He verbalized understanding and was in agreement with plan for discharge home.        Final Clinical Impression(s) / ED Diagnoses Final diagnoses:  Hypertension, unspecified type    Rx / DC Orders ED  Discharge Orders          Ordered    hydrALAZINE (APRESOLINE) 50 MG tablet  3 times daily        06/03/21 2302              Terald Sleeper, MD 06/04/21 [phone]

## 2021-06-03 NOTE — ED Triage Notes (Signed)
Pt arrived via POV for HTN 190/120 at home. Pt c/o dizziness upon standing and walking. Pt denies chest pain and SOB.Pt states he woke up with a HA and stuffy nose and took coricidin HBP this morning for the stuffy nose and it helped.

## 2021-06-07 ENCOUNTER — Telehealth: Payer: Self-pay

## 2021-06-07 NOTE — Telephone Encounter (Signed)
Called and lmom to r/s as one pharmd will not be in

## 2021-06-09 ENCOUNTER — Ambulatory Visit: Payer: BC Managed Care – PPO

## 2021-06-17 ENCOUNTER — Ambulatory Visit: Payer: BC Managed Care – PPO | Admitting: Pharmacist

## 2021-06-17 ENCOUNTER — Other Ambulatory Visit: Payer: Self-pay

## 2021-06-17 NOTE — Progress Notes (Signed)
Patient ID: Alexander [name]                 DOB: Apr 27, 1976                    MRN: [id number]     HPI: Alexander [name] is a 46 y.o. male patient referred to pharmacy clinic by Dr. Haroldine Laws to initiate weight loss therapy with GLP1-RA. PMH is significant for obesity complicated by chronic medical conditions including  systolic heart failure (presumed tachy-mediated), obesity, HTN, hyperlipidemia, adjustment disorder, anxiety, CAD, OSA, gout and A fib. Most recent BMI 41.9.  Patient had a friend who lost some weight on ozmepic and reached out to CHF clinic. Was referred to [place]. Patient is motivated to loose some weight. Feels like he would be able to exercise more and lift weights if he was down some pounds. Knows that his diet choices have not been great. Has issues with portion control.   Current weight management medications: none  Previously tried meds: none  Current meds that may affect weight: none  Baseline weight/BMI: 318lb/41.96  Insurance payor: [employer]  Diet:  -Breakfast: eggs or cereal -Lunch: left overs, healthy choice meals -Dinner: meat and starch , broccoli, asparagus, peas, kale, sweet potatoes, green beans, brown rice or pasta -Snacks: chips, pretzels, granola -Drinks: water, black coffee, diet soda  Exercise: walk 1-2 miles per day  Family History:  Family History  Problem Relation Age of Onset   COPD Mother     Social History: drinks one drink a week at most, no tobacco, no illicit drugs  Labs: Lab Results  Component Value Date   HGBA1C 6.0 (H) 05/17/2020  A1C 09/07/20 5.6  Wt Readings from Last 1 Encounters:  06/03/21 298 lb 8.1 oz (135.4 kg)    BP Readings from Last 1 Encounters:  06/03/21 (!) 147/98   Pulse Readings from Last 1 Encounters:  06/03/21 78    No results found for: CHOL, TRIG, HDL, CHOLHDL, VLDL, LDLCALC, LDLDIRECT  Past Medical History:  Diagnosis Date   Arrhythmia    Atrial fibrillation ([place])    CAD (coronary artery disease)  08/17/2020   PCI + DES to pLCx, 70% mLAD negative FFR testing (medical therapy)    CHF (congestive heart failure) ([place])    Chronic systolic heart failure ([facility])    Gout    Hyperlipidemia    Hypertension    OSA (obstructive sleep apnea) 09/2020    Current Outpatient Medications on File Prior to Visit  Medication Sig Dispense Refill   acetaminophen (TYLENOL) 500 MG tablet Take 500 mg by mouth every 6 (six) hours as needed for moderate pain.     allopurinol (ZYLOPRIM) 100 MG tablet Take 100 mg by mouth daily.     amiodarone (PACERONE) 200 MG tablet Take 200 mg by mouth daily.     buPROPion (WELLBUTRIN XL) 300 MG 24 hr tablet Take 300 mg by mouth daily.     carvedilol (COREG) 6.25 MG tablet TAKE 1 TABLET BY MOUTH 2 TIMES DAILY WITH A MEAL. 180 tablet 3   clonazePAM (KLONOPIN) 0.5 MG tablet Take 0.5 mg by mouth 2 (two) times daily as needed for anxiety.     clopidogrel (PLAVIX) 75 MG tablet Take 1 tablet (75 mg total) by mouth daily. 90 tablet 3   colchicine 0.6 MG tablet Take 0.6 mg by mouth 2 (two) times daily as needed (gout).     doxazosin (CARDURA) 1 MG tablet Take 1 tablet (1 mg total)  by mouth at bedtime. 30 tablet 11   FARXIGA 10 MG TABS tablet TAKE 1 TABLET BY MOUTH EVERY DAY 30 tablet 6   fenofibrate (TRICOR) 145 MG tablet Take 145 mg by mouth daily.     furosemide (LASIX) 20 MG tablet TAKE 1 TABLET BY MOUTH AS NEEDED FOR FLUID OR EDEMA 30 tablet 5   hydrALAZINE (APRESOLINE) 50 MG tablet Take 1 tablet (50 mg total) by mouth 3 (three) times daily for 30 doses. 30 tablet 0   Multiple Vitamin (MULTIVITAMIN WITH MINERALS) TABS tablet Take 1 tablet by mouth daily.     nitroGLYCERIN (NITROSTAT) 0.4 MG SL tablet Place 1 tablet (0.4 mg total) under the tongue every 5 (five) minutes as needed for chest pain. 25 tablet 0   potassium chloride SA (KLOR-CON) 20 MEQ tablet Take 1 tablet (20 mEq total) by mouth as needed (with Lasix). 90 tablet 3   rivaroxaban (XARELTO) 20 MG TABS tablet Take 1  tablet (20 mg total) by mouth daily. 90 tablet 1   rosuvastatin (CRESTOR) 40 MG tablet Take 1 tablet (40 mg total) by mouth daily. 30 tablet 11   sacubitril-valsartan (ENTRESTO) 97-103 MG Take 1 tablet by mouth 2 (two) times daily. 60 tablet 11   Semaglutide-Weight Management 0.25 MG/0.5ML SOAJ Inject 0.25 mg into the skin once a week for 28 days. 2 mL 0   [START ON 06/23/2021] Semaglutide-Weight Management 0.5 MG/0.5ML SOAJ Inject 0.5 mg into the skin once a week for 28 days. 2 mL 0   [START ON 07/22/2021] Semaglutide-Weight Management 1 MG/0.5ML SOAJ Inject 1 mg into the skin once a week for 28 days. 2 mL 0   [START ON 08/20/2021] Semaglutide-Weight Management 1.7 MG/0.75ML SOAJ Inject 1.7 mg into the skin once a week for 28 days. 3 mL 0   [START ON 09/18/2021] Semaglutide-Weight Management 2.4 MG/0.75ML SOAJ Inject 2.4 mg into the skin once a week for 28 days. 3 mL 11   spironolactone (ALDACTONE) 25 MG tablet Take 1 tablet (25 mg total) by mouth daily. 30 tablet 11   topiramate (TOPAMAX) 25 MG tablet Take 25 mg by mouth 2 (two) times daily.     traMADol (ULTRAM) 50 MG tablet Take 50 mg by mouth 3 (three) times daily as needed for moderate pain.     [DISCONTINUED] digoxin (LANOXIN) 0.125 MG tablet Take 1 tablet (0.125 mg total) by mouth daily. 30 tablet 6   No current facility-administered medications on file prior to visit.    Allergies  Allergen Reactions   Albuterol Anaphylaxis   Bee Venom Anaphylaxis   Metaproterenol Anaphylaxis   Other Anaphylaxis    DEET   Amoxicillin-Pot Clavulanate Itching    OK with Amoxicillin   Cephalexin Other (See Comments)    Chest tightness   Amoxicillin Itching and Rash    Itching all over       Assessment/Plan:  1. Weight loss - Patient has not met goal of at least 5% of body weight loss with comprehensive lifestyle modifications alone in the past 3-6 months. Pharmacotherapy is appropriate to pursue as augmentation. Will start Wegovy. Confirmed  patient not pregnant and no personal or family history of medullary thyroid carcinoma (MTC) or Multiple Endocrine Neoplasia syndrome type 2 (MEN 2). No hx of pancreatis or gallstones.  Advised patient on common side effects including nausea, diarrhea, dyspepsia, decreased appetite, and fatigue. Counseled patient on reducing meal size and how to titrate medication to minimize side effects. Counseled patient to call if intolerable  side effects or if experiencing dehydration, abdominal pain, or dizziness. Patient will adhere to dietary modifications and will target at least 150 minutes of moderate intensity exercise weekly.   Injection technique reviewed at today's visit and patient successfully self-administered first dose of Wegovy 0.59m into the fatty tissue of the abdomen.  He will get baseline lipids and A1C next Thursday.  Titration Plan:  Will plan to follow the titration plan as below, pending patient is tolerating each dose before increasing to the next. Can slow titration if needed for tolerability.    -Month 1: Inject 0.25 mg SQ once weekly x 4 weeks -Month 2: Inject 0.523mSQ once weekly x 4 weeks -Month 3: Inject 110m68mQ  once weekly x 4 weeks -Month 4+: Inject 1.7mg6110m once weekly   Follow up in 3 months in person, 1 month via telephone.

## 2021-06-17 NOTE — Patient Instructions (Signed)
GLP-1 Receptor Agonist Counseling Points This medication reduces your appetite and may make you feel fuller longer.  Stop eating when your body tells you that you are full. This will likely happen sooner than you are used to. Store your medication in the fridge until you are ready to use it. Inject your medication in the fatty tissue of your lower abdominal area (2 inches away from belly button) or upper outer thigh. Rotate injection sites. Each pen will last you about 1 month (the first month it will last a few weeks longer). Use a different needle with each weekly injection. Common side effects include: nausea, diarrhea/constipation, and heartburn, and are more likely to occur if you overeat.  Dosing schedule: - Month 1: Inject 0.25mg  subcutaneously once weekly for 4 weeks - Month 2: Inject 0.5mg   subcutaneously once weekly for 4 weeks - Month 3: Inject 1mg  subcutaneously once weekly for 4 weeks - Month 4: Inject 1.7mg  subcutaneously once weekly  Tips for living a healthier life     Building a Healthy and Balanced Diet Make most of your meal vegetables and fruits -  of your plate. Aim for color and variety, and remember that potatoes dont count as vegetables on the Healthy Eating Plate because of their negative impact on blood sugar.  Go for whole grains -  of your plate. Whole and intact grains--whole wheat, barley, wheat berries, quinoa, oats, brown rice, and foods made with them, such as whole wheat pasta--have a milder effect on blood sugar and insulin than white bread, white rice, and other refined grains.  Protein power -  of your plate. Fish, poultry, beans, and nuts are all healthy, versatile protein sources--they can be mixed into salads, and pair well with vegetables on a plate. Limit red meat, and avoid processed meats such as bacon and sausage.  Healthy plant oils - in moderation. Choose healthy vegetable oils like olive, canola, soy, corn, sunflower, peanut, and  others, and avoid partially hydrogenated oils, which contain unhealthy trans fats. Remember that low-fat does not mean healthy.  Drink water, coffee, or tea. Skip sugary drinks, limit milk and dairy products to one to two servings per day, and limit juice to a small glass per day.  Stay active. The red figure running across the Healthy Eating Plates placemat is a reminder that staying active is also important in weight control.  The main message of the Healthy Eating Plate is to focus on diet quality:  The type of carbohydrate in the diet is more important than the amount of carbohydrate in the diet, because some sources of carbohydrate--like vegetables (other than potatoes), fruits, whole grains, and beans--are healthier than others. The Healthy Eating Plate also advises consumers to avoid sugary beverages, a major source of calories--usually with little nutritional value--in the American diet. The Healthy Eating Plate encourages consumers to use healthy oils, and it does not set a maximum on the percentage of calories people should get each day from healthy sources of fat. In this way, the Healthy Eating Plate recommends the opposite of the low-fat message promoted for decades by the USDA.  [employer]  SUGAR  Sugar is a huge problem in the modern day diet. Sugar is a big contributor to heart disease, diabetes, high triglyceride levels, fatty liver disease and obesity. Sugar is hidden in almost all packaged foods/beverages. Added sugar is extra sugar that is added beyond what is naturally found and has no nutritional benefit for your body. The American Heart Association recommends limiting  added sugars to no more than 25g for women and 36 grams for men per day. There are many names for sugar including maltose, sucrose (names ending in "ose"), high fructose corn syrup, molasses, cane sugar, corn sweetener, raw sugar, syrup, honey or fruit  juice concentrate.   One of the best ways to limit your added sugars is to stop drinking sweetened beverages such as soda, sweet tea, and fruit juice.  There is 65g of added sugars in one 20oz bottle of Coke! That is equal to 7.5 donuts.   Pay attention and read all nutrition facts labels. Below is an examples of a nutrition facts label. The #1 is showing you the total sugars where the # 2 is showing you the added sugars. This one serving has almost the max amount of added sugars per day!     20 oz Soda 65g Sugar = 7.5 Glazed Donuts  16oz Energy  Drink 54g Sugar = 6.5 Glazed Donuts  Large Sweet  Tea 38g Sugar = 4 Glazed Donuts  20oz Sports  Drink 34g Sugar = 3.5 Glazed Donuts  8oz Chocolate Milk 24g Sugar =2.5 Glazed Donuts  8oz Orange  Juice 21g Sugar = 2 Glazed Donuts  1 Juice Box 14g Sugar = 1.5 Glazed Donuts  16oz Water= NO SUGAR!!  EXERCISE  Exercise is good. Weve all heard that. In an ideal world, we would all have time and resources to get plenty of it. When you are active, your heart pumps more efficiently and you will feel better.  Multiple studies show that even walking regularly has benefits that include living a longer life. The American Heart Association recommends 150 minutes per week of exercise (30 minutes per day most days of the week). You can do this in any increment you wish. Nine or more 10-minute walks count. So does an hour-long exercise class. Break the time apart into what will work in your life. Some of the best things you can do include walking briskly, jogging, cycling or swimming laps. Not everyone is ready to exercise. Sometimes we need to start with just getting active. Here are some easy ways to be more active throughout the day:  Take the stairs instead of the elevator  Go for a 10-15 minute walk during your lunch break (find a friend to make it more enjoyable)  When shopping, park at the back of the parking lot  If you take public  transportation, get off one stop early and walk the extra distance  Pace around while making phone calls  Check with your doctor if you arent sure what your limitations may be. Always remember to drink plenty of water when doing any type of exercise. Dont feel like a failure if youre not getting the 90-150 minutes per week. If you started by being a couch potato, then just a 10-minute walk each day is a huge improvement. Start with little victories and work your way up.   HEALTHY EATING TIPS  When looking to improve your eating habits, whether to lose weight, lower blood pressure or just be healthier, it helps to know what a serving size is.   Grains 1 slice of bread,  bagel,  cup pasta or rice  Vegetables 1 cup fresh or raw vegetables,  cup cooked or canned Fruits 1 piece of medium sized fruit,  cup canned,   Meats/Proteins  cup dried       1 oz meat, 1 egg,  cup cooked beans, nuts or seeds  Dairy        Fats Individual yogurt container, 1 cup (8oz)    1 teaspoon margarine/butter or vegetable  milk or milk alternative, 1 slice of cheese          oil; 1 tablespoon mayonnaise or salad dressing                  Plan ahead: make a menu of the meals for a week then create a grocery list to go with that menu. Consider meals that easily stretch into a night of leftovers, such as stews or casseroles. Or consider making two of your favorite meal and put one in the freezer for another night. Try a night or two each week that is meatless or no cook such as salads. When you get home from the grocery store wash and prepare your vegetables and fruits. Then when you need them they are ready to go.   Tips for going to the grocery store:  Buy store or generic brands  Check the weekly ad from your store on-line or in their in-store flyer  Look at the unit price on the shelf tag to compare/contrast the costs of different items  Buy fruits/vegetables in season  Carrots, bananas and apples are  low-cost, naturally healthy items  If meats or frozen vegetables are on sale, buy some extras and put in your freezer  Limit buying prepared or ready to eat items, even if they are pre-made salads or fruit snacks  Do not shop when youre hungry  Foods at eye level tend to be more expensive. Look on the high and low shelves for deals.  Consider shopping at the farmers market for fresh foods in season.  Avoid the cookie and chip aisles (these are expensive, high in calories and low in nutritional value). Shop on the outside of the grocery store.  Healthy food preparations:  If you cant get lean hamburger, be sure to drain the fat when cooking  Steam, saut (in olive oil), grill or bake foods  Experiment with different seasonings to avoid adding salt to your foods. Kosher salt, sea salt and Himalayan salt are all still salt and should be avoided. Try seasoning food with onion, garlic, thyme, rosemary, basil ect. Onion powder or garlic powder is ok. Avoid if it says salt (ie garlic salt).

## 2021-06-20 NOTE — Progress Notes (Signed)
Virtual Visit via Video Note   This visit type was conducted due to national recommendations for restrictions regarding the COVID-19 Pandemic (e.g. social distancing) in an effort to limit this patient's exposure and mitigate transmission in our community.  Due to his co-morbid illnesses, this patient is at least at moderate risk for complications without adequate follow up.  This format is felt to be most appropriate for this patient at this time.  All issues noted in this document were discussed and addressed.  A limited physical exam was performed with this format.  Please refer to the patient's chart for his consent to telehealth for [facility].       Date:  06/21/2021   ID:  Alexander [name], DOB 11-08-1975, MRN [id number] The patient was identified using 2 identifiers.  Patient Location: Home Provider Location: Home Office   PCP:  Alexander Chessman, MD   [facility] Providers Cardiologist:  Arvilla Meres, MD Electrophysiologist:  Lanier Prude, MD  Sleep Medicine:  Armanda Magic, MD     Evaluation Performed:  Follow-Up Visit  Chief Complaint:  OSA  History of Present Illness:    Alexander [name] is a 46 y.o. male with a history of PAF, AF CAD, chronic systolic CHF, hypertension and hyperlipidemia who was referred for split-night sleep study by Boyce Medici, PA.  He tells me that he was having issues with excessive daytime sleepiness.  When he was hospitalized he was noted but multiple nurses that he had O2 desats while sleeping and felt he had OSA.  He was found to have moderate obstructive sleep apnea with an AHI of 20.6/h and mild oxygen desaturations as low as 86%.  He underwent CPAP titration to 16 cm H2O.  He is now here for follow-up.  He is is doing well with he CPAP device and thinks that he has gotten used to it.  He tolerates the mask and feels the pressure is adequate.  Since going on CPAP he he feels rested in the am and has no significant daytime  sleepiness.  He denies any significant mouth or nasal dryness.  He has issues with nasal congestion at times and then finds that he gets claustraphobic when he puts the [facility] in . He uses flonase but no nasal saline spray. He does not think that he snores.    The patient does not have symptoms concerning for COVID-19 infection (fever, chills, cough, or new shortness of breath).    Past Medical History:  Diagnosis Date   Arrhythmia    Atrial fibrillation (HCC)    CAD (coronary artery disease) 08/17/2020   PCI + DES to pLCx, 70% mLAD negative FFR testing (medical therapy)    CHF (congestive heart failure) (HCC)    Chronic systolic heart failure (HCC)    Gout    Hyperlipidemia    Hypertension    OSA (obstructive sleep apnea) 09/2020   Past Surgical History:  Procedure Laterality Date   CARDIOVERSION N/A 05/15/2020   Procedure: CARDIOVERSION;  Surgeon: Dolores Patty, MD;  Location: [facility] ENDOSCOPY;  Service: Cardiovascular;  Laterality: N/A;   CORONARY STENT INTERVENTION N/A 08/17/2020   Procedure: CORONARY STENT INTERVENTION;  Surgeon: Kathleene Hazel, MD;  Location: [facility] INVASIVE CV LAB;  Service: Cardiovascular;  Laterality: N/A;   RIGHT/LEFT HEART CATH AND CORONARY ANGIOGRAPHY N/A 08/17/2020   Procedure: RIGHT/LEFT HEART CATH AND CORONARY ANGIOGRAPHY;  Surgeon: Dolores Patty, MD;  Location: [facility] INVASIVE CV LAB;  Service: Cardiovascular;  Laterality: N/A;  SEPTOPLASTY       Current Meds  Medication Sig   acetaminophen (TYLENOL) 500 MG tablet Take 500 mg by mouth every 6 (six) hours as needed for moderate pain.   allopurinol (ZYLOPRIM) 100 MG tablet Take 100 mg by mouth daily.   amiodarone (PACERONE) 200 MG tablet Take 200 mg by mouth daily.   buPROPion (WELLBUTRIN XL) 300 MG 24 hr tablet Take 300 mg by mouth daily.   carvedilol (COREG) 6.25 MG tablet TAKE 1 TABLET BY MOUTH 2 TIMES DAILY WITH A MEAL.   clonazePAM (KLONOPIN) 0.5 MG tablet Take 0.5 mg by mouth 2 (two) times  daily as needed for anxiety.   clopidogrel (PLAVIX) 75 MG tablet Take 1 tablet (75 mg total) by mouth daily.   colchicine 0.6 MG tablet Take 0.6 mg by mouth 2 (two) times daily as needed (gout).   doxazosin (CARDURA) 1 MG tablet Take 1 tablet (1 mg total) by mouth at bedtime.   FARXIGA 10 MG TABS tablet TAKE 1 TABLET BY MOUTH EVERY DAY   fenofibrate (TRICOR) 145 MG tablet Take 145 mg by mouth daily.   furosemide (LASIX) 20 MG tablet TAKE 1 TABLET BY MOUTH AS NEEDED FOR FLUID OR EDEMA   hydrALAZINE (APRESOLINE) 50 MG tablet Take 1 tablet (50 mg total) by mouth 3 (three) times daily for 30 doses.   Multiple Vitamin (MULTIVITAMIN WITH MINERALS) TABS tablet Take 1 tablet by mouth daily.   nitroGLYCERIN (NITROSTAT) 0.4 MG SL tablet Place 1 tablet (0.4 mg total) under the tongue every 5 (five) minutes as needed for chest pain.   potassium chloride SA (KLOR-CON) 20 MEQ tablet Take 1 tablet (20 mEq total) by mouth as needed (with Lasix).   rivaroxaban (XARELTO) 20 MG TABS tablet Take 1 tablet (20 mg total) by mouth daily.   rosuvastatin (CRESTOR) 40 MG tablet Take 1 tablet (40 mg total) by mouth daily.   sacubitril-valsartan (ENTRESTO) 97-103 MG Take 1 tablet by mouth 2 (two) times daily.   Semaglutide-Weight Management 0.25 MG/0.5ML SOAJ Inject 0.25 mg into the skin once a week for 28 days.   [START ON 06/23/2021] Semaglutide-Weight Management 0.5 MG/0.5ML SOAJ Inject 0.5 mg into the skin once a week for 28 days.   [START ON 07/22/2021] Semaglutide-Weight Management 1 MG/0.5ML SOAJ Inject 1 mg into the skin once a week for 28 days.   [START ON 08/20/2021] Semaglutide-Weight Management 1.7 MG/0.75ML SOAJ Inject 1.7 mg into the skin once a week for 28 days.   [START ON 09/18/2021] Semaglutide-Weight Management 2.4 MG/0.75ML SOAJ Inject 2.4 mg into the skin once a week for 28 days.   spironolactone (ALDACTONE) 25 MG tablet Take 1 tablet (25 mg total) by mouth daily.   topiramate (TOPAMAX) 25 MG tablet Take 25  mg by mouth 2 (two) times daily.   traMADol (ULTRAM) 50 MG tablet Take 50 mg by mouth 3 (three) times daily as needed for moderate pain.     Allergies:   Albuterol, Bee venom, Metaproterenol, Other, Amoxicillin-pot clavulanate, Cephalexin, and Amoxicillin   Social History   Tobacco Use   Smoking status: Never   Smokeless tobacco: Never  Substance Use Topics   Alcohol use: Yes    Comment: occ   Drug use: Not Currently     Family Hx: The patient's family history includes COPD in his mother.  ROS:   Please see the history of present illness.     All other systems reviewed and are negative.   Prior CV studies:  The following studies were reviewed today:  Split-night CPAP titration and Pap compliance download  Labs/Other Tests and Data Reviewed:    EKG:  No ECG reviewed.  Recent Labs: 08/03/2020: B Natriuretic Peptide 10.6 03/01/2021: ALT 36; TSH 2.663 06/03/2021: BUN 14; Creatinine, Ser 1.29; Hemoglobin 16.3; Platelets 266; Potassium 3.9; Sodium 138   Recent Lipid Panel No results found for: CHOL, TRIG, HDL, CHOLHDL, LDLCALC, LDLDIRECT  Wt Readings from Last 3 Encounters:  06/21/21 (!) 315 lb (142.9 kg)  06/17/21 (!) 318 lb (144.2 kg)  06/03/21 298 lb 8.1 oz (135.4 kg)     Risk Assessment/Calculations:    Objective:    Vital Signs:  BP 130/85    Pulse 70    Ht 6\' 1"  (1.854 m)    Wt (!) 315 lb (142.9 kg)    BMI 41.56 kg/m    VITAL SIGNS:  reviewed GEN:  no acute distress EYES:  sclerae anicteric, EOMI - Extraocular Movements Intact RESPIRATORY:  normal respiratory effort, symmetric expansion CARDIOVASCULAR:  no peripheral edema SKIN:  no rash, lesions or ulcers. MUSCULOSKELETAL:  no obvious deformities. NEURO:  alert and oriented x 3, no obvious focal deficit PSYCH:  normal affect  ASSESSMENT & PLAN:    OSA - The patient is tolerating PAP therapy well without any problems. The PAP download performed by his DME was personally reviewed and interpreted by me  today and showed an AHI of 0.3/hr on 16 cm H2O with 70% compliance in using more than 4 hours nightly.  The patient has been using and benefiting from PAP use and will continue to benefit from therapy. -encouraged him to get a humidifier in his bedroom to prevent the water resivior from running out of water -encouraged him to use nasal saline spray 2 sprays each nostril twice daily  Hypertension -BP is controlled on exam today -Continue prescription drug management with hydralazine 50 mg 3 times daily, doxazosin 1 mg nightly, Entresto 97-103 mg twice daily, carvedilol 6.25 mg twice daily with as needed refills  COVID-19 Education: The signs and symptoms of COVID-19 were discussed with the patient and how to seek care for testing (follow up with PCP or arrange E-visit).  The importance of social distancing was discussed today.  Time:   Today, I have spent 15 minutes with the patient with telehealth technology discussing the above problems.     Medication Adjustments/Labs and Tests Ordered: Current medicines are reviewed at length with the patient today.  Concerns regarding medicines are outlined above.   Tests Ordered: No orders of the defined types were placed in this encounter.   Medication Changes: No orders of the defined types were placed in this encounter.   Follow Up:  In Person in 1 year(s)  Signed, , MD  06/21/2021 9:23 AM    [facility]

## 2021-06-21 ENCOUNTER — Other Ambulatory Visit: Payer: Self-pay

## 2021-06-21 ENCOUNTER — Telehealth (INDEPENDENT_AMBULATORY_CARE_PROVIDER_SITE_OTHER): Payer: BC Managed Care – PPO | Admitting: Cardiology

## 2021-06-21 ENCOUNTER — Encounter: Payer: Self-pay | Admitting: Cardiology

## 2021-06-21 VITALS — BP 130/85 | HR 70 | Ht 73.0 in | Wt 315.0 lb

## 2021-06-21 DIAGNOSIS — G4733 Obstructive sleep apnea (adult) (pediatric): Secondary | ICD-10-CM | POA: Diagnosis not present

## 2021-06-21 DIAGNOSIS — I10 Essential (primary) hypertension: Secondary | ICD-10-CM

## 2021-06-23 ENCOUNTER — Telehealth: Payer: Self-pay | Admitting: *Deleted

## 2021-06-23 DIAGNOSIS — G4733 Obstructive sleep apnea (adult) (pediatric): Secondary | ICD-10-CM

## 2021-06-23 NOTE — Telephone Encounter (Signed)
Order placed to [facility] via fax. 

## 2021-06-23 NOTE — Telephone Encounter (Signed)
-----   Message from Alexander Majors, RN sent at 06/21/2021  9:36 AM EST ----- Per Dr. Mayford Knife,  Order CPAP supplies -he has not gotten any cushions or filters. Thanks!

## 2021-06-24 ENCOUNTER — Other Ambulatory Visit: Payer: Self-pay

## 2021-06-24 ENCOUNTER — Other Ambulatory Visit: Payer: BC Managed Care – PPO

## 2021-06-24 LAB — LIPID PANEL
Chol/HDL Ratio: 3.8 ratio (ref 0.0–5.0)
Cholesterol, Total: 130 mg/dL (ref 100–199)
HDL: 34 mg/dL — ABNORMAL LOW (ref >39)
LDL Chol Calc (NIH): 60 mg/dL (ref 0–99)
Triglycerides: 223 mg/dL — ABNORMAL HIGH (ref 0–149)
VLDL Cholesterol Cal: 36 mg/dL (ref 5–40)

## 2021-06-24 LAB — HEMOGLOBIN A1C
Est. average glucose Bld gHb Est-mCnc: 126 mg/dL
Hgb A1c MFr Bld: 6 % — ABNORMAL HIGH (ref 4.8–5.6)

## 2021-06-25 DIAGNOSIS — G4733 Obstructive sleep apnea (adult) (pediatric): Secondary | ICD-10-CM | POA: Diagnosis not present

## 2021-07-12 ENCOUNTER — Telehealth: Payer: Self-pay | Admitting: Pharmacist

## 2021-07-12 NOTE — Telephone Encounter (Signed)
Called pt to see how he was going on [facility] 0.25mg . State he is doing well. Had an upset stomach for a few hours for the first 2 shots but nothing with the past 2. ?Lost about 9lb. Is still walking daily. Not hungry. Is eating smaller portions.  ?He will get 0.5mg  filled as he is due for it on wed. ?I will call patient in 1 month for follow up. Encouraged him to add resistance training into his exercise routine a few days a week. ?  ?

## 2021-07-23 DIAGNOSIS — G4733 Obstructive sleep apnea (adult) (pediatric): Secondary | ICD-10-CM | POA: Diagnosis not present

## 2021-08-04 ENCOUNTER — Other Ambulatory Visit (HOSPITAL_COMMUNITY): Payer: Self-pay | Admitting: Cardiology

## 2021-08-09 ENCOUNTER — Encounter: Payer: Self-pay | Admitting: Cardiology

## 2021-08-10 MED ORDER — WEGOVY 1 MG/0.5ML ~~LOC~~ SOAJ
1.0000 mg | SUBCUTANEOUS | 0 refills | Status: DC
Start: 2021-08-10 — End: 2021-09-16

## 2021-08-10 NOTE — Addendum Note (Signed)
Addended by: Malena Peer D on: 08/10/2021 08:47 AM ? ? Modules accepted: Orders ? ?

## 2021-08-10 NOTE — Telephone Encounter (Signed)
Called patient to see how he was doing on Wegovy 0.5mg  weekly.  ?States he is doing well. Nausea only for a few hours after the first injection. ? ?Doing HIT training 3-4 days a week ?Will increase to [facility] 1mg  weekly. I will f/u with patient in 4 weeks. ? ?

## 2021-08-11 DIAGNOSIS — G4733 Obstructive sleep apnea (adult) (pediatric): Secondary | ICD-10-CM | POA: Diagnosis not present

## 2021-08-23 DIAGNOSIS — G4733 Obstructive sleep apnea (adult) (pediatric): Secondary | ICD-10-CM | POA: Diagnosis not present

## 2021-09-07 NOTE — Telephone Encounter (Signed)
Called pt to see how he was doing on Wegovy 1mg  weekly. States he is doing well. Down to 300lb. Does notice he has more side effects. Gets nausea about 12h later after taking shot. Lasts a few hours. He did miss 1 dose. Has one more 1mg  pen to give this week. ?He will give that does- recommended giving earlier in the day so that he is sleeping at the 12hr mark. He will let me know if he wants to stay at the 1mg  longer or increase to 1.7mg .  ? ?Significant other tested positive for COVID so will move out his apt with PharmD a few weeks. ? ?

## 2021-09-09 ENCOUNTER — Ambulatory Visit: Payer: BC Managed Care – PPO

## 2021-09-15 ENCOUNTER — Other Ambulatory Visit: Payer: Self-pay | Admitting: Internal Medicine

## 2021-09-15 ENCOUNTER — Encounter: Payer: Self-pay | Admitting: Pharmacist

## 2021-09-16 MED ORDER — WEGOVY 1.7 MG/0.75ML ~~LOC~~ SOAJ
1.7000 mg | SUBCUTANEOUS | 0 refills | Status: DC
Start: 2021-09-16 — End: 2021-10-19

## 2021-09-22 ENCOUNTER — Other Ambulatory Visit (HOSPITAL_COMMUNITY): Payer: Self-pay | Admitting: Cardiology

## 2021-09-22 DIAGNOSIS — G4733 Obstructive sleep apnea (adult) (pediatric): Secondary | ICD-10-CM | POA: Diagnosis not present

## 2021-09-24 DIAGNOSIS — R310 Gross hematuria: Secondary | ICD-10-CM | POA: Diagnosis not present

## 2021-09-24 DIAGNOSIS — M545 Low back pain, unspecified: Secondary | ICD-10-CM | POA: Diagnosis not present

## 2021-09-24 DIAGNOSIS — G8929 Other chronic pain: Secondary | ICD-10-CM | POA: Diagnosis not present

## 2021-09-24 DIAGNOSIS — F419 Anxiety disorder, unspecified: Secondary | ICD-10-CM | POA: Diagnosis not present

## 2021-09-28 NOTE — Progress Notes (Unsigned)
Patient ID: Alexander [name]                 DOB: Jun 13, 1975                    MRN: [id number]     HPI: Alexander [name] is a 46 y.o. male patient referred to pharmacy clinic by Dr. Haroldine Laws to initiate weight loss therapy with GLP1-RA. PMH is significant for obesity complicated by chronic medical conditions including  systolic heart failure (presumed tachy-mediated), obesity, HTN, hyperlipidemia, adjustment disorder, anxiety, CAD, OSA, gout and A fib. Seen by PharmD on 06/17/21 where pt injected first dose of Wegovy 0.25 mg. At telephone f/u on 07/12/21, pt endorsed upset stomach during first two weeks, but no AE with last two 0.25 mg shots. Lost ~9lbs during first month. Transitioned to [facility] 0.5 mg SUBQ weekly and started doing HIIT training 3-4 days/week. After 4x injections of Wegovy 0.5 mg, transitioned to [facility] 1 mg weekly. On 5/2 reported that he was down to 300 lbs, but was experiencing more nausea with 1 mg dose. Pt did request Rx for Wegovy 1.7 mg on 09/15/21. Most recent BMI .   Notes - update diet/exercise - new weight - AE - will repeat A1C and lipids at 6 mo  Current weight management medications: none  Previously tried meds: none  Current meds that may affect weight: none  Baseline weight/BMI: 318lb/41.96  Insurance payor: [employer]  Diet:  -Breakfast: eggs or cereal -Lunch: left overs, healthy choice meals -Dinner: meat and starch , broccoli, asparagus, peas, kale, sweet potatoes, green beans, brown rice or pasta -Snacks: chips, pretzels, granola -Drinks: water, black coffee, diet soda  Exercise: walk 1-2 miles per day  Family History:  Family History  Problem Relation Age of Onset   COPD Mother     Social History: drinks one drink a week at most, no tobacco, no illicit drugs  Labs: Lab Results  Component Value Date   HGBA1C 6.0 (H) 06/24/2021  A1C 09/07/20 5.6  Wt Readings from Last 1 Encounters:  06/21/21 (!) 315 lb (142.9 kg)    BP Readings from Last 1  Encounters:  06/21/21 130/85   Pulse Readings from Last 1 Encounters:  06/21/21 70       Component Value Date/Time   CHOL 130 06/24/2021 0827   TRIG 223 (H) 06/24/2021 0827   HDL 34 (L) 06/24/2021 0827   CHOLHDL 3.8 06/24/2021 0827   LDLCALC 60 06/24/2021 0827    Past Medical History:  Diagnosis Date   Arrhythmia    Atrial fibrillation (HCC)    CAD (coronary artery disease) 08/17/2020   PCI + DES to pLCx, 70% mLAD negative FFR testing (medical therapy)    CHF (congestive heart failure) ([place])    Chronic systolic heart failure (HCC)    Gout    Hyperlipidemia    Hypertension    OSA (obstructive sleep apnea) 09/2020    Current Outpatient Medications on File Prior to Visit  Medication Sig Dispense Refill   acetaminophen (TYLENOL) 500 MG tablet Take 500 mg by mouth every 6 (six) hours as needed for moderate pain.     allopurinol (ZYLOPRIM) 100 MG tablet Take 100 mg by mouth daily.     amiodarone (PACERONE) 200 MG tablet Take 1 tablet (200 mg total) by mouth daily. 90 tablet 3   buPROPion (WELLBUTRIN XL) 300 MG 24 hr tablet Take 300 mg by mouth daily.     carvedilol (COREG) 6.25 MG tablet TAKE  1 TABLET BY MOUTH 2 TIMES DAILY WITH A MEAL. 180 tablet 3   clonazePAM (KLONOPIN) 0.5 MG tablet Take 0.5 mg by mouth 2 (two) times daily as needed for anxiety.     colchicine 0.6 MG tablet Take 0.6 mg by mouth 2 (two) times daily as needed (gout).     dapagliflozin propanediol (FARXIGA) 10 MG TABS tablet Take 1 tablet (10 mg total) by mouth daily. 90 tablet 3   doxazosin (CARDURA) 1 MG tablet Take 1 tablet (1 mg total) by mouth at bedtime. 30 tablet 11   fenofibrate (TRICOR) 145 MG tablet Take 145 mg by mouth daily.     furosemide (LASIX) 20 MG tablet TAKE 1 TABLET BY MOUTH AS NEEDED FOR FLUID OR EDEMA 30 tablet 5   hydrALAZINE (APRESOLINE) 50 MG tablet Take 1 tablet (50 mg total) by mouth 3 (three) times daily for 30 doses. 30 tablet 0   Multiple Vitamin (MULTIVITAMIN WITH MINERALS) TABS  tablet Take 1 tablet by mouth daily.     nitroGLYCERIN (NITROSTAT) 0.4 MG SL tablet Place 1 tablet (0.4 mg total) under the tongue every 5 (five) minutes as needed for chest pain. 25 tablet 0   potassium chloride SA (KLOR-CON) 20 MEQ tablet Take 1 tablet (20 mEq total) by mouth as needed (with Lasix). 90 tablet 3   rivaroxaban (XARELTO) 20 MG TABS tablet Take 1 tablet (20 mg total) by mouth daily. 90 tablet 1   rosuvastatin (CRESTOR) 40 MG tablet Take 1 tablet (40 mg total) by mouth daily. 30 tablet 11   sacubitril-valsartan (ENTRESTO) 97-103 MG Take 1 tablet by mouth 2 (two) times daily. 60 tablet 11   Semaglutide-Weight Management (WEGOVY) 1.7 MG/0.75ML SOAJ Inject 1.7 mg into the skin once a week. 3 mL 0   spironolactone (ALDACTONE) 25 MG tablet Take 1 tablet (25 mg total) by mouth daily. 30 tablet 11   topiramate (TOPAMAX) 25 MG tablet Take 25 mg by mouth 2 (two) times daily.     traMADol (ULTRAM) 50 MG tablet Take 50 mg by mouth 3 (three) times daily as needed for moderate pain.     [DISCONTINUED] digoxin (LANOXIN) 0.125 MG tablet Take 1 tablet (0.125 mg total) by mouth daily. 30 tablet 6   No current facility-administered medications on file prior to visit.    Allergies  Allergen Reactions   Albuterol Anaphylaxis   Alexander Venom Anaphylaxis   Metaproterenol Anaphylaxis   Other Anaphylaxis    DEET   Amoxicillin-Pot Clavulanate Itching    OK with Amoxicillin   Cephalexin Other (See Comments)    Chest tightness   Amoxicillin Itching and Rash    Itching all over       Assessment/Plan:  1. Weight loss - Patient has not met goal of at least 5% of body weight loss with comprehensive lifestyle modifications alone in the past 3-6 months. Pharmacotherapy is appropriate to pursue as augmentation. Will start Wegovy. Confirmed patient not pregnant and no personal or family history of medullary thyroid carcinoma (MTC) or Multiple Endocrine Neoplasia syndrome type 2 (MEN 2). No hx of pancreatis  or gallstones.  Advised patient on common side effects including nausea, diarrhea, dyspepsia, decreased appetite, and fatigue. Counseled patient on reducing meal size and how to titrate medication to minimize side effects. Counseled patient to call if intolerable side effects or if experiencing dehydration, abdominal pain, or dizziness. Patient will adhere to dietary modifications and will target at least 150 minutes of moderate intensity exercise weekly.  Injection technique reviewed at today's visit and patient successfully self-administered first dose of Wegovy 0.36m into the fatty tissue of the abdomen.  He will get baseline lipids and A1C next Thursday.  Titration Plan:  Will plan to follow the titration plan as below, pending patient is tolerating each dose before increasing to the next. Can slow titration if needed for tolerability.    -Month 1: Inject 0.25 mg SQ once weekly x 4 weeks -Month 2: Inject 0.550mSQ once weekly x 4 weeks -Month 3: Inject 19m63mQ  once weekly x 4 weeks -Month 4+: Inject 1.7mg5m once weekly   Follow up in 3 months in person, 1 month via telephone.  Patient seen with [place] [employer] PharmD Candidate

## 2021-09-29 ENCOUNTER — Ambulatory Visit: Payer: BC Managed Care – PPO | Admitting: Pharmacist

## 2021-09-29 DIAGNOSIS — I5023 Acute on chronic systolic (congestive) heart failure: Secondary | ICD-10-CM | POA: Diagnosis not present

## 2021-09-29 DIAGNOSIS — I10 Essential (primary) hypertension: Secondary | ICD-10-CM | POA: Diagnosis not present

## 2021-09-29 NOTE — Patient Instructions (Addendum)
It was great to see you today!  Continue taking Wegovy 1.7 mg injection once weekly. We will call you near your 4th dose of Wegovy 1.7 mg to decide whether you would like to increase to Wegovy 2.4 mg.  Ask Dr. Gala Romney about your amiodarone and clopidogrel  Keep up the great work increasing physical activity and adding more vegetables into your diet.

## 2021-10-12 ENCOUNTER — Other Ambulatory Visit (HOSPITAL_COMMUNITY): Payer: Self-pay | Admitting: Cardiology

## 2021-10-19 ENCOUNTER — Encounter: Payer: Self-pay | Admitting: Pharmacist

## 2021-10-19 MED ORDER — WEGOVY 1.7 MG/0.75ML ~~LOC~~ SOAJ: 1.7000 mg | SUBCUTANEOUS | 11 refills | Status: DC

## 2021-10-23 DIAGNOSIS — G4733 Obstructive sleep apnea (adult) (pediatric): Secondary | ICD-10-CM | POA: Diagnosis not present

## 2021-11-03 ENCOUNTER — Other Ambulatory Visit (HOSPITAL_COMMUNITY): Payer: Self-pay | Admitting: Cardiology

## 2021-11-05 ENCOUNTER — Other Ambulatory Visit (HOSPITAL_COMMUNITY): Payer: Self-pay | Admitting: Internal Medicine

## 2021-11-22 DIAGNOSIS — G4733 Obstructive sleep apnea (adult) (pediatric): Secondary | ICD-10-CM | POA: Diagnosis not present

## 2021-11-23 DIAGNOSIS — G4733 Obstructive sleep apnea (adult) (pediatric): Secondary | ICD-10-CM | POA: Diagnosis not present

## 2021-12-23 DIAGNOSIS — G4733 Obstructive sleep apnea (adult) (pediatric): Secondary | ICD-10-CM | POA: Diagnosis not present

## 2021-12-24 ENCOUNTER — Encounter: Payer: Self-pay | Admitting: Pharmacist

## 2021-12-24 MED ORDER — WEGOVY 1 MG/0.5ML ~~LOC~~ SOAJ: 1.0000 mg | SUBCUTANEOUS | 11 refills | Status: DC

## 2021-12-29 ENCOUNTER — Telehealth: Payer: Self-pay | Admitting: Pharmacist

## 2021-12-29 NOTE — Telephone Encounter (Signed)
Insurance denied Alexander [name] bc he is not on the maintenance dose of 2.4mg . Pt has side effects on the 1.7mg , therefore he cannot use 2.4mg . 1.7mg  has recently been approved for a maintenance dose. I called insurance and started a peer to peer.

## 2021-12-30 ENCOUNTER — Encounter: Payer: Self-pay | Admitting: Pharmacist

## 2021-12-30 NOTE — Telephone Encounter (Signed)
Peer to peer complete. PA approved through 06/28/22.

## 2021-12-31 DIAGNOSIS — M1A09X0 Idiopathic chronic gout, multiple sites, without tophus (tophi): Secondary | ICD-10-CM | POA: Diagnosis not present

## 2022-01-06 ENCOUNTER — Other Ambulatory Visit (HOSPITAL_COMMUNITY): Payer: Self-pay | Admitting: Cardiology

## 2022-01-06 NOTE — Telephone Encounter (Signed)
Prescription refill request for Xarelto received.  Indication: PAF Last office visit: 03/01/21  D Bensimhon MD Weight: 135.4kg Age: 46 Scr: 1.29 on 06/03/21 CrCl: 138.49  Based on above findings Xarelto 20mg  daily is the appropriate dose.  Refill approved.

## 2022-01-11 ENCOUNTER — Ambulatory Visit: Payer: BC Managed Care – PPO

## 2022-02-14 ENCOUNTER — Ambulatory Visit: Payer: BC Managed Care – PPO

## 2022-03-22 ENCOUNTER — Ambulatory Visit: Payer: BC Managed Care – PPO | Attending: Cardiology | Admitting: Pharmacist

## 2022-03-22 DIAGNOSIS — E785 Hyperlipidemia, unspecified: Secondary | ICD-10-CM | POA: Diagnosis not present

## 2022-03-22 NOTE — Progress Notes (Signed)
Patient ID: Alexander [name]                 DOB: [birth year]                    MRN: [id number]     HPI: Sher Hellinger is a 46 y.o. male patient referred to pharmacy clinic by Dr. Gala Romney to follow up weight loss therapy with GLP1-RA. PMH is significant for obesity, complicated by chronic medical conditions including  systolic heart failure (presumed tachy-mediated), HTN, hyperlipidemia, adjustment disorder, anxiety, CAD (s/p DES), OSA, gout and A fib. Patient previously on the 1.7 mg of Wegovy with difficulty. Stated the side effects were lasting longer and wanted to move down to the 1 mg. Due to back orders of the 1 mg, patient stayed on the 1.7 mg. Patient admits to missing doses due to side effects during the summer time. He would miss multiple doses due to increased stress in life, travelling for work, and side effects. He was able to start tolerating the medication and has been on the his 1.7 mg dose for last 4 weeks. During the summer time patient reported gaining a significant amount of weight (~20 lbs) and up to 320 lbs while not taking the medication, but once restarting it he has lost weight again and back down to 296 lbs. His 1.7 mg dose was approved as the maintenance dose by the insurance after peer to peer review. Most recent BMI from 09/2021 was 39.37 and 298 lbs in 09/2021.  Today, patient states he is tolerating the 1.7 mg wegovy weekly. He reports no side effects besides a minor headache at times. Patient would like to hold off on titrating the medication up to 2.4 mg until after the holiday season. He continues to incorporate physical activity into his daily lifestyle and reaches the goal of 150 minutes/week and is being mindful with diet limiting his sodium, meal prepping, limiting alcohol consumption, and eating more vegetables.  Current weight management medications: Wegovy 1.7 mg weekly  Previously tried meds: none  Current meds that may affect weight: none  Baseline weight/BMI:  318 lbs/ 41.96  Insurance payor: [employer]  Diet: Since [place], added in more vegetables, trying to eat less carbs, feel fulls after 1/3 of meal he use to eat. Is trying to eat 3 small meals/day to avoid binging at night. Moderate alcohol intake-> feels effects of alcohol much longer, has 1-2 drinks per week -Has two boys in sports and does a lot of eating on the run, but trying to meal prep chicken at the beginning of the week. Has been watching sodium intake and seeing it benefit his blood pressure.  Exercise: Added HIIT training 2x/week and walks 30 minutes daily (reaches goal of 150 minutes/week)  Family History:  Family History  Problem Relation Age of Onset   COPD Mother      Social History:  reports that he has never smoked. He has never used smokeless tobacco. He reports current alcohol use. He reports that he does not currently use drugs.   Labs: Lab Results  Component Value Date   HGBA1C 6.0 (H) 06/24/2021    Wt Readings from Last 1 Encounters:  09/29/21 298 lb 6.4 oz (135.4 kg)    BP Readings from Last 1 Encounters:  09/29/21 128/82   Pulse Readings from Last 1 Encounters:  06/21/21 70       Component Value Date/Time   CHOL 130 06/24/2021 0827   TRIG 223 (H) 06/24/2021  0827   HDL 34 (L) 06/24/2021 0827   CHOLHDL 3.8 06/24/2021 0827   LDLCALC 60 06/24/2021 0827    Past Medical History:  Diagnosis Date   Arrhythmia    Atrial fibrillation (HCC)    CAD (coronary artery disease) 08/17/2020   PCI + DES to pLCx, 70% mLAD negative FFR testing (medical therapy)    CHF (congestive heart failure) (HCC)    Chronic systolic heart failure (HCC)    Gout    Hyperlipidemia    Hypertension    OSA (obstructive sleep apnea) 09/2020    Current Outpatient Medications on File Prior to Visit  Medication Sig Dispense Refill   acetaminophen (TYLENOL) 500 MG tablet Take 500 mg by mouth every 6 (six) hours as needed for moderate pain.     allopurinol (ZYLOPRIM) 100 MG tablet  Take 100 mg by mouth daily.     amiodarone (PACERONE) 200 MG tablet Take 1 tablet (200 mg total) by mouth daily. 90 tablet 3   buPROPion (WELLBUTRIN XL) 300 MG 24 hr tablet Take 300 mg by mouth daily.     carvedilol (COREG) 6.25 MG tablet TAKE 1 TABLET BY MOUTH 2 TIMES DAILY WITH A MEAL. 180 tablet 3   clonazePAM (KLONOPIN) 0.5 MG tablet Take 0.5 mg by mouth 2 (two) times daily as needed for anxiety.     clopidogrel (PLAVIX) 75 MG tablet TAKE 1 TABLET BY MOUTH EVERY DAY 90 tablet 3   colchicine 0.6 MG tablet Take 0.6 mg by mouth 2 (two) times daily as needed (gout).     dapagliflozin propanediol (FARXIGA) 10 MG TABS tablet Take 1 tablet (10 mg total) by mouth daily. 90 tablet 3   doxazosin (CARDURA) 1 MG tablet Take 1 tablet (1 mg total) by mouth at bedtime. 30 tablet 11   ENTRESTO 97-103 MG TAKE 1 TABLET BY MOUTH TWICE A DAY 60 tablet 6   fenofibrate (TRICOR) 145 MG tablet Take 145 mg by mouth daily.     furosemide (LASIX) 20 MG tablet TAKE 1 TABLET BY MOUTH AS NEEDED FOR FLUID OR EDEMA 30 tablet 5   hydrALAZINE (APRESOLINE) 50 MG tablet Take 1 tablet (50 mg total) by mouth 3 (three) times daily for 30 doses. 30 tablet 0   Multiple Vitamin (MULTIVITAMIN WITH MINERALS) TABS tablet Take 1 tablet by mouth daily.     nitroGLYCERIN (NITROSTAT) 0.4 MG SL tablet Place 1 tablet (0.4 mg total) under the tongue every 5 (five) minutes as needed for chest pain. 25 tablet 0   potassium chloride SA (KLOR-CON) 20 MEQ tablet Take 1 tablet (20 mEq total) by mouth as needed (with Lasix). 90 tablet 3   rivaroxaban (XARELTO) 20 MG TABS tablet TAKE 1 TABLET BY MOUTH EVERY DAY 30 tablet 5   rosuvastatin (CRESTOR) 40 MG tablet Take 1 tablet (40 mg total) by mouth daily. 30 tablet 11   Semaglutide-Weight Management (WEGOVY) 1 MG/0.5ML SOAJ Inject 1 mg into the skin once a week. 2 mL 11   spironolactone (ALDACTONE) 25 MG tablet TAKE 1 TABLET (25 MG TOTAL) BY MOUTH DAILY. 30 tablet 6   topiramate (TOPAMAX) 25 MG tablet  Take 25 mg by mouth 2 (two) times daily.     traMADol (ULTRAM) 50 MG tablet Take 50 mg by mouth 3 (three) times daily as needed for moderate pain.     [DISCONTINUED] digoxin (LANOXIN) 0.125 MG tablet Take 1 tablet (0.125 mg total) by mouth daily. 30 tablet 6   No current facility-administered medications  on file prior to visit.    Allergies  Allergen Reactions   Albuterol Anaphylaxis   Bee Venom Anaphylaxis   Metaproterenol Anaphylaxis   Other Anaphylaxis    DEET   Amoxicillin-Pot Clavulanate Itching    OK with Amoxicillin   Cephalexin Other (See Comments)    Chest tightness   Amoxicillin Itching and Rash    Itching all over     Assessment/Plan: Weight Loss  Assessment: Has been tolerating Wegovy 1.7 mg once weekly for past 4 weeks Stopped taking the medication due to increase stress, travelling for work, and not tolerating the dose, but back on schedule Has seen weight loss since he started taking the medication scheduled again (down to 296 lbs) Has decreased total body wight by ~7% since starting the medication and current BMI is 39.05 Continues to eat healthy: eats smaller meals with medication, adding more vegetables to meals, watching sodium intake, meal prepping chicken, and limiting alcohol consumption to 1-2 drinks per week Continue physical activity of HIIT workouts 2x/week and walking 30 minutes daily Plan: Continue Wegovy 1.7 mg subq once weekly Monitor for tolerance and side effects of medications Follow up titrating dose to 2.4 mg subq once weekly after holiday season Continue non-drug measures such as working on diet and continuing physical activity Follow up with patient about fasting A1c and lipid panel this week Follow up if any issues getting medication or intolerances.   Arabella Merles, PharmD. [facility] Acute Care PGY-1 03/22/2022 9:18 AM   Olene Floss, Pharm.D, BCPS, CPP [facility] [address] [place], [place] [address]  Phone: [phone]; Fax: [phone]

## 2022-03-22 NOTE — Patient Instructions (Addendum)
-  Continue Wegovy 1.7 mg weekly - Continue to add vegetables and watch meal sizes especially during holiday season -Continue to add exercise into daily routine -F/u after the hoiday season to inc wegovy dose

## 2022-03-23 LAB — LIPID PANEL
Chol/HDL Ratio: 3 ratio (ref 0.0–5.0)
Cholesterol, Total: 96 mg/dL — ABNORMAL LOW (ref 100–199)
HDL: 32 mg/dL — ABNORMAL LOW (ref >39)
LDL Chol Calc (NIH): 37 mg/dL (ref 0–99)
Triglycerides: 160 mg/dL — ABNORMAL HIGH (ref 0–149)
VLDL Cholesterol Cal: 27 mg/dL (ref 5–40)

## 2022-03-23 LAB — HEMOGLOBIN A1C
Est. average glucose Bld gHb Est-mCnc: 111 mg/dL
Hgb A1c MFr Bld: 5.5 % (ref 4.8–5.6)

## 2022-05-05 DIAGNOSIS — G4733 Obstructive sleep apnea (adult) (pediatric): Secondary | ICD-10-CM | POA: Diagnosis not present

## 2022-05-14 ENCOUNTER — Other Ambulatory Visit (HOSPITAL_COMMUNITY): Payer: Self-pay | Admitting: Internal Medicine

## 2022-05-15 ENCOUNTER — Other Ambulatory Visit (HOSPITAL_COMMUNITY): Payer: Self-pay | Admitting: Cardiology

## 2022-06-03 ENCOUNTER — Other Ambulatory Visit (HOSPITAL_COMMUNITY): Payer: Self-pay | Admitting: Physician Assistant

## 2022-07-05 ENCOUNTER — Other Ambulatory Visit (HOSPITAL_COMMUNITY): Payer: Self-pay | Admitting: Cardiology

## 2022-07-12 ENCOUNTER — Inpatient Hospital Stay (HOSPITAL_BASED_OUTPATIENT_CLINIC_OR_DEPARTMENT_OTHER)
Admission: EM | Admit: 2022-07-12 | Discharge: 2022-07-14 | DRG: 321 | Disposition: A | Discharge status: Home / Self Care | Payer: BC Managed Care – PPO | Attending: Internal Medicine | Admitting: Internal Medicine

## 2022-07-12 ENCOUNTER — Other Ambulatory Visit (HOSPITAL_BASED_OUTPATIENT_CLINIC_OR_DEPARTMENT_OTHER): Payer: Self-pay

## 2022-07-12 ENCOUNTER — Emergency Department (HOSPITAL_BASED_OUTPATIENT_CLINIC_OR_DEPARTMENT_OTHER): Payer: BC Managed Care – PPO | Admitting: Radiology

## 2022-07-12 ENCOUNTER — Other Ambulatory Visit: Payer: Self-pay

## 2022-07-12 ENCOUNTER — Emergency Department (HOSPITAL_BASED_OUTPATIENT_CLINIC_OR_DEPARTMENT_OTHER): Payer: BC Managed Care – PPO

## 2022-07-12 ENCOUNTER — Encounter (HOSPITAL_BASED_OUTPATIENT_CLINIC_OR_DEPARTMENT_OTHER): Payer: Self-pay | Admitting: Emergency Medicine

## 2022-07-12 DIAGNOSIS — M109 Gout, unspecified: Secondary | ICD-10-CM | POA: Diagnosis present

## 2022-07-12 DIAGNOSIS — Z825 Family history of asthma and other chronic lower respiratory diseases: Secondary | ICD-10-CM | POA: Diagnosis not present

## 2022-07-12 DIAGNOSIS — Z888 Allergy status to other drugs, medicaments and biological substances status: Secondary | ICD-10-CM

## 2022-07-12 DIAGNOSIS — Z7985 Long-term (current) use of injectable non-insulin antidiabetic drugs: Secondary | ICD-10-CM

## 2022-07-12 DIAGNOSIS — Z7901 Long term (current) use of anticoagulants: Secondary | ICD-10-CM

## 2022-07-12 DIAGNOSIS — Z79899 Other long term (current) drug therapy: Secondary | ICD-10-CM

## 2022-07-12 DIAGNOSIS — Z7984 Long term (current) use of oral hypoglycemic drugs: Secondary | ICD-10-CM | POA: Diagnosis not present

## 2022-07-12 DIAGNOSIS — Z87892 Personal history of anaphylaxis: Secondary | ICD-10-CM

## 2022-07-12 DIAGNOSIS — I200 Unstable angina: Secondary | ICD-10-CM | POA: Diagnosis not present

## 2022-07-12 DIAGNOSIS — Z881 Allergy status to other antibiotic agents status: Secondary | ICD-10-CM

## 2022-07-12 DIAGNOSIS — Z91030 Bee allergy status: Secondary | ICD-10-CM

## 2022-07-12 DIAGNOSIS — E785 Hyperlipidemia, unspecified: Secondary | ICD-10-CM | POA: Diagnosis not present

## 2022-07-12 DIAGNOSIS — Z955 Presence of coronary angioplasty implant and graft: Secondary | ICD-10-CM

## 2022-07-12 DIAGNOSIS — F419 Anxiety disorder, unspecified: Secondary | ICD-10-CM | POA: Diagnosis not present

## 2022-07-12 DIAGNOSIS — I209 Angina pectoris, unspecified: Secondary | ICD-10-CM | POA: Diagnosis not present

## 2022-07-12 DIAGNOSIS — I110 Hypertensive heart disease with heart failure: Secondary | ICD-10-CM | POA: Diagnosis present

## 2022-07-12 DIAGNOSIS — I25110 Atherosclerotic heart disease of native coronary artery with unstable angina pectoris: Secondary | ICD-10-CM | POA: Diagnosis present

## 2022-07-12 DIAGNOSIS — Z9989 Dependence on other enabling machines and devices: Secondary | ICD-10-CM | POA: Diagnosis not present

## 2022-07-12 DIAGNOSIS — I5023 Acute on chronic systolic (congestive) heart failure: Secondary | ICD-10-CM | POA: Diagnosis present

## 2022-07-12 DIAGNOSIS — I2089 Other forms of angina pectoris: Principal | ICD-10-CM

## 2022-07-12 DIAGNOSIS — I480 Paroxysmal atrial fibrillation: Secondary | ICD-10-CM | POA: Diagnosis present

## 2022-07-12 DIAGNOSIS — I4891 Unspecified atrial fibrillation: Secondary | ICD-10-CM | POA: Diagnosis present

## 2022-07-12 DIAGNOSIS — I10 Essential (primary) hypertension: Secondary | ICD-10-CM | POA: Diagnosis present

## 2022-07-12 DIAGNOSIS — R079 Chest pain, unspecified: Secondary | ICD-10-CM | POA: Diagnosis not present

## 2022-07-12 DIAGNOSIS — G4733 Obstructive sleep apnea (adult) (pediatric): Secondary | ICD-10-CM | POA: Diagnosis not present

## 2022-07-12 LAB — HEPARIN LEVEL (UNFRACTIONATED): Heparin Unfractionated: >1.1 IU/mL — ABNORMAL HIGH (ref 0.30–0.70)

## 2022-07-12 LAB — CBC
HCT: 52 % (ref 39.0–52.0)
Hemoglobin: 17.5 g/dL — ABNORMAL HIGH (ref 13.0–17.0)
MCH: 30.4 pg (ref 26.0–34.0)
MCHC: 33.7 g/dL (ref 30.0–36.0)
MCV: 90.3 fL (ref 80.0–100.0)
Platelets: 205 10*3/uL (ref 150–400)
RBC: 5.76 MIL/uL (ref 4.22–5.81)
RDW: 13 % (ref 11.5–15.5)
WBC: 8.1 10*3/uL (ref 4.0–10.5)
nRBC: 0 % (ref 0.0–0.2)

## 2022-07-12 LAB — BASIC METABOLIC PANEL
Anion gap: 9 (ref 5–15)
BUN: 20 mg/dL (ref 6–20)
CO2: 23 mmol/L (ref 22–32)
Calcium: 10 mg/dL (ref 8.9–10.3)
Chloride: 106 mmol/L (ref 98–111)
Creatinine, Ser: 1.11 mg/dL (ref 0.61–1.24)
GFR, Estimated: >60 mL/min (ref >60)
Glucose, Bld: 109 mg/dL — ABNORMAL HIGH (ref 70–99)
Potassium: 4.8 mmol/L (ref 3.5–5.1)
Sodium: 138 mmol/L (ref 135–145)

## 2022-07-12 LAB — HIV ANTIBODY (ROUTINE TESTING W REFLEX): HIV Screen 4th Generation wRfx: NONREACTIVE

## 2022-07-12 LAB — APTT: aPTT: 45 seconds — ABNORMAL HIGH (ref 24–36)

## 2022-07-12 LAB — TROPONIN I (HIGH SENSITIVITY)
Troponin I (High Sensitivity): 10 ng/L (ref <18)
Troponin I (High Sensitivity): 11 ng/L (ref <18)

## 2022-07-12 MED ORDER — FENOFIBRATE 160 MG PO TABS
160.0000 mg | ORAL_TABLET | Freq: Every day | ORAL | Status: DC
  Administered 2022-07-13 – 2022-07-14 (×2): 160 mg via ORAL
  Filled 2022-07-12 (×2): qty 1

## 2022-07-12 MED ORDER — SPIRONOLACTONE 25 MG PO TABS
25.0000 mg | ORAL_TABLET | Freq: Every day | ORAL | Status: DC
  Administered 2022-07-13 – 2022-07-14 (×2): 25 mg via ORAL
  Filled 2022-07-12 (×2): qty 1

## 2022-07-12 MED ORDER — SODIUM CHLORIDE 0.9% FLUSH
3.0000 mL | Freq: Two times a day (BID) | INTRAVENOUS | Status: DC
  Administered 2022-07-12 – 2022-07-14 (×4): 3 mL via INTRAVENOUS

## 2022-07-12 MED ORDER — ONDANSETRON HCL 4 MG/2ML IJ SOLN: 4.0000 mg | Freq: Four times a day (QID) | INTRAMUSCULAR | Status: DC | PRN

## 2022-07-12 MED ORDER — ISOSORBIDE MONONITRATE ER 30 MG PO TB24
30.0000 mg | ORAL_TABLET | Freq: Every day | ORAL | Status: DC
  Administered 2022-07-12 – 2022-07-14 (×3): 30 mg via ORAL
  Filled 2022-07-12 (×3): qty 1

## 2022-07-12 MED ORDER — ACETAMINOPHEN 325 MG PO TABS
650.0000 mg | ORAL_TABLET | ORAL | Status: DC | PRN
  Administered 2022-07-13 (×2): 650 mg via ORAL
  Filled 2022-07-12 (×2): qty 2

## 2022-07-12 MED ORDER — SODIUM CHLORIDE 0.9% FLUSH: 3.0000 mL | INTRAVENOUS | Status: DC | PRN

## 2022-07-12 MED ORDER — HEPARIN BOLUS VIA INFUSION
2000.0000 [IU] | Freq: Once | INTRAVENOUS | Status: AC
  Administered 2022-07-12: 2000 [IU] via INTRAVENOUS
  Filled 2022-07-12: qty 2000

## 2022-07-12 MED ORDER — CARVEDILOL 6.25 MG PO TABS
6.2500 mg | ORAL_TABLET | Freq: Two times a day (BID) | ORAL | Status: DC
  Administered 2022-07-12 – 2022-07-14 (×4): 6.25 mg via ORAL
  Filled 2022-07-12 (×4): qty 1

## 2022-07-12 MED ORDER — NITROGLYCERIN 0.4 MG SL SUBL
0.4000 mg | SUBLINGUAL_TABLET | SUBLINGUAL | Status: DC | PRN
  Administered 2022-07-12: 0.4 mg via SUBLINGUAL
  Filled 2022-07-12: qty 1

## 2022-07-12 MED ORDER — HEPARIN BOLUS VIA INFUSION
4000.0000 [IU] | Freq: Once | INTRAVENOUS | Status: AC
  Administered 2022-07-12: 4000 [IU] via INTRAVENOUS

## 2022-07-12 MED ORDER — SODIUM CHLORIDE 0.9 % WEIGHT BASED INFUSION
1.0000 mL/kg/h | INTRAVENOUS | Status: DC
  Administered 2022-07-13: 1 mL/kg/h via INTRAVENOUS

## 2022-07-12 MED ORDER — BUPROPION HCL ER (XL) 150 MG PO TB24
300.0000 mg | ORAL_TABLET | Freq: Every day | ORAL | Status: DC
  Administered 2022-07-13 – 2022-07-14 (×2): 300 mg via ORAL
  Filled 2022-07-12 (×2): qty 2

## 2022-07-12 MED ORDER — AMIODARONE HCL 200 MG PO TABS
200.0000 mg | ORAL_TABLET | Freq: Every day | ORAL | Status: DC
  Administered 2022-07-13 – 2022-07-14 (×2): 200 mg via ORAL
  Filled 2022-07-12 (×2): qty 1

## 2022-07-12 MED ORDER — DAPAGLIFLOZIN PROPANEDIOL 10 MG PO TABS
10.0000 mg | ORAL_TABLET | Freq: Every day | ORAL | Status: DC
  Administered 2022-07-13 – 2022-07-14 (×2): 10 mg via ORAL
  Filled 2022-07-12 (×2): qty 1

## 2022-07-12 MED ORDER — SODIUM CHLORIDE 0.9 % WEIGHT BASED INFUSION
3.0000 mL/kg/h | INTRAVENOUS | Status: DC
  Administered 2022-07-13: 3 mL/kg/h via INTRAVENOUS

## 2022-07-12 MED ORDER — ROSUVASTATIN CALCIUM 20 MG PO TABS
40.0000 mg | ORAL_TABLET | Freq: Every day | ORAL | Status: DC
  Administered 2022-07-13 – 2022-07-14 (×2): 40 mg via ORAL
  Filled 2022-07-12 (×2): qty 2

## 2022-07-12 MED ORDER — ASPIRIN 81 MG PO TBEC
81.0000 mg | DELAYED_RELEASE_TABLET | Freq: Every day | ORAL | Status: DC
  Administered 2022-07-13 – 2022-07-14 (×2): 81 mg via ORAL
  Filled 2022-07-12 (×3): qty 1

## 2022-07-12 MED ORDER — HEPARIN (PORCINE) 25000 UT/250ML-% IV SOLN
1800.0000 [IU]/h | INTRAVENOUS | Status: DC
  Administered 2022-07-12: 1700 [IU]/h via INTRAVENOUS
  Administered 2022-07-12: 1500 [IU]/h via INTRAVENOUS
  Filled 2022-07-12 (×2): qty 250

## 2022-07-12 MED ORDER — NITROGLYCERIN 0.4 MG SL SUBL: 0.4000 mg | SUBLINGUAL_TABLET | SUBLINGUAL | Status: DC | PRN

## 2022-07-12 MED ORDER — SODIUM CHLORIDE 0.9 % IV SOLN: 250.0000 mL | INTRAVENOUS | Status: DC | PRN

## 2022-07-12 MED ORDER — DOXAZOSIN MESYLATE 1 MG PO TABS
1.0000 mg | ORAL_TABLET | Freq: Every day | ORAL | Status: DC
  Administered 2022-07-12 – 2022-07-13 (×2): 1 mg via ORAL
  Filled 2022-07-12 (×3): qty 1

## 2022-07-12 NOTE — ED Notes (Signed)
Alexander [name] with [facility] called for transport

## 2022-07-12 NOTE — Progress Notes (Signed)
ANTICOAGULATION CONSULT NOTE - Initial Consult  Pharmacy Consult for heparin Indication: chest pain/ACS  Allergies  Allergen Reactions   Albuterol Anaphylaxis   Bee Venom Anaphylaxis   Metaproterenol Anaphylaxis   Other Anaphylaxis    DEET   Amoxicillin-Pot Clavulanate Itching    OK with Amoxicillin   Cephalexin Other (See Comments)    Chest tightness   Amoxicillin Itching and Rash    Itching all over      Patient Measurements: Height: '6\' 1"'$  (185.4 cm) Weight: 136.1 kg (300 lb) IBW/kg (Calculated) : 79.9 Heparin Dosing Weight: 110kg  Vital Signs: Temp: 97.8 F (36.6 C) (03/05 0859) Temp Source: Oral (03/05 0859) BP: 135/99 (03/05 1200) Pulse Rate: 69 (03/05 1200)  Labs: Recent Labs    07/12/22 0854 07/12/22 1100  HGB 17.5*  --   HCT 52.0  --   PLT 205  --   CREATININE 1.11  --   TROPONINIHS 10 11    Estimated Creatinine Clearance: 121.7 mL/min (by C-G formula based on SCr of 1.11 mg/dL).   Medical History: Past Medical History:  Diagnosis Date   Arrhythmia    Atrial fibrillation ([place])    CAD (coronary artery disease) 08/17/2020   PCI + DES to pLCx, 70% mLAD negative FFR testing (medical therapy)    CHF (congestive heart failure) (HCC)    Chronic systolic heart failure (HCC)    Gout    Hyperlipidemia    Hypertension    OSA (obstructive sleep apnea) 09/2020    Medications:  Infusions:   heparin      Assessment: 24 yom presented to the ED with CP. To start IV heparin. He is on chronic xarelto for history of afib. His last dose was last night. Verified with ED provider to go ahead and start heparin gtt with a bolus now. Baseline CBC is ok and no bleeding noted.   Goal of Therapy:  Heparin level 0.3-0.7 units/ml aPTT 66-103 seconds Monitor platelets by anticoagulation protocol: Yes   Plan:  Heparin bolus 4000 units IV x 1 Heparin gtt 1500 units/hr Check a 6 hr heparin level/aPTT Daily heparin level, aPTT and CBC  Alexander [name], Rande Lawman 07/12/2022,12:30 PM

## 2022-07-12 NOTE — ED Notes (Signed)
RN attempted report x2 

## 2022-07-12 NOTE — ED Provider Notes (Signed)
[facility] Provider Note   CSN: [id number] Arrival date & time: 07/12/22  0847     History  Chief Complaint  Patient presents with   Chest Pain    Keli Loy is a 47 y.o. male.  Pt is a 47 y/o male with hx of CHF (22' EF 55-60%), HTN, hyperlipidemia, anxiety, CAD (s/p DES), OSA, gout and PAF on xarelto who is presenting today with complaint of chest pain.  Patient reports that the last few days he has been working in the yard and other than feeling tired has been feeling fine.  He woke up this morning and was okay but when he started his morning routine and when he was driving to the grocery store he started feeling a tight discomfort in the left side of his chest.  He reports that he went back home took his morning medications but the pain did not go away.  He has never had a pain like this before and reports it feels like somebody is just pushing and squeezing in the left side of his chest.  He does not have any shortness of breath, nausea, diaphoresis but does report he is kind of lost his appetite.  The pain does not radiate into his arm back jaw or abdomen.  He has been compliant with his medications but reports he has not been terribly compliant with low-salt diet recently.  He does check his weight regularly and his weight has not changed more than 1 pound up or down.  He has not had any recent medication changes.  He denies cough, congestion, recent URI symptoms.  He denies ever having a pain like this in the past.  He did have nitroglycerin at home but reports he had never used them before and the bottles that expired so he did not use any prior to coming here.  He reports at its worst the pain was a 5 out of 10 and currently is a 4 out of 10.  The history is provided by the patient.  Chest Pain      Home Medications Prior to Admission medications   Medication Sig Start Date End Date Taking? Authorizing Provider  acetaminophen  (TYLENOL) 500 MG tablet Take 500 mg by mouth every 6 (six) hours as needed for moderate pain.    [provider]  allopurinol (ZYLOPRIM) 100 MG tablet Take 100 mg by mouth daily. 02/13/20   [provider]  amiodarone (PACERONE) 200 MG tablet Take 1 tablet (200 mg total) by mouth daily. 09/23/21   Bensimhon, Shaune Pascal, MD  buPROPion (WELLBUTRIN XL) 300 MG 24 hr tablet Take 300 mg by mouth daily. 02/13/20   [provider]  carvedilol (COREG) 6.25 MG tablet TAKE 1 TABLET BY MOUTH 2 TIMES DAILY WITH A MEAL. 11/20/20   Bensimhon, Shaune Pascal, MD  clonazePAM (KLONOPIN) 0.5 MG tablet Take 0.5 mg by mouth 2 (two) times daily as needed for anxiety. 11/27/19   [provider]  clopidogrel (PLAVIX) 75 MG tablet TAKE 1 TABLET BY MOUTH EVERY DAY 11/03/21   Lyda Jester M, PA-C  colchicine 0.6 MG tablet Take 0.6 mg by mouth 2 (two) times daily as needed (gout). 02/28/20   [provider]  dapagliflozin propanediol (FARXIGA) 10 MG TABS tablet Take 1 tablet (10 mg total) by mouth daily. 08/04/21   Sueanne Margarita, MD  doxazosin (CARDURA) 1 MG tablet TAKE 1 TABLET BY MOUTH AT BEDTIME. 05/16/22   Bensimhon, Quillian Quince  R, MD  fenofibrate (TRICOR) 145 MG tablet Take 145 mg by mouth daily. 02/13/20   [provider]  furosemide (LASIX) 20 MG tablet TAKE 1 TABLET BY MOUTH AS NEEDED FOR FLUID OR EDEMA 06/06/22   Sueanne Margarita, MD  hydrALAZINE (APRESOLINE) 50 MG tablet Take 1 tablet (50 mg total) by mouth 3 (three) times daily for 30 doses. 06/03/21 06/21/21  Wyvonnia Dusky, MD  Multiple Vitamin (MULTIVITAMIN WITH MINERALS) TABS tablet Take 1 tablet by mouth daily.    [provider]  nitroGLYCERIN (NITROSTAT) 0.4 MG SL tablet Place 1 tablet (0.4 mg total) under the tongue every 5 (five) minutes as needed for chest pain. 08/17/20 08/17/21  Bensimhon, Shaune Pascal, MD  potassium chloride SA (KLOR-CON) 20 MEQ tablet Take 1 tablet (20 mEq total) by mouth as needed (with Lasix).  05/26/20   Lyda Jester M, PA-C  rivaroxaban (XARELTO) 20 MG TABS tablet TAKE 1 TABLET BY MOUTH EVERY DAY 01/06/22   Bensimhon, Shaune Pascal, MD  rosuvastatin (CRESTOR) 40 MG tablet Take 1 tablet (40 mg total) by mouth daily. 05/21/21   Simmons, Brittainy M, PA-C  sacubitril-valsartan (ENTRESTO) 97-103 MG TAKE 1 TABLET BY MOUTH TWICE A DAY 07/05/22   Sueanne Margarita, MD  Semaglutide-Weight Management (WEGOVY) 1 MG/0.5ML SOAJ Inject 1 mg into the skin once a week. 12/24/21   Bensimhon, Shaune Pascal, MD  spironolactone (ALDACTONE) 25 MG tablet TAKE 1 TABLET (25 MG TOTAL) BY MOUTH DAILY. 05/16/22   Lyda Jester M, PA-C  topiramate (TOPAMAX) 25 MG tablet Take 25 mg by mouth 2 (two) times daily. 04/12/21   [provider]  traMADol (ULTRAM) 50 MG tablet Take 50 mg by mouth 3 (three) times daily as needed for moderate pain. 02/13/20   [provider]  digoxin (LANOXIN) 0.125 MG tablet Take 1 tablet (0.125 mg total) by mouth daily. 06/15/20 08/03/20  Consuelo Pandy, PA-C      Allergies    Albuterol, Bee venom, Metaproterenol, Other, Amoxicillin-pot clavulanate, Cephalexin, and Amoxicillin    Review of Systems   Review of Systems  Cardiovascular:  Positive for chest pain.    Physical Exam Updated Vital Signs BP (!) 135/99   Pulse 69   Temp 97.8 F (36.6 C) (Oral)   Resp 14   SpO2 97%  Physical Exam Vitals and nursing note reviewed.  Constitutional:      General: He is not in acute distress.    Appearance: He is well-developed.  HENT:     Head: Normocephalic and atraumatic.  Eyes:     Conjunctiva/sclera: Conjunctivae normal.     Pupils: Pupils are equal, round, and reactive to light.  Cardiovascular:     Rate and Rhythm: Normal rate and regular rhythm.     Heart sounds: No murmur heard. Pulmonary:     Effort: Pulmonary effort is normal. No respiratory distress.     Breath sounds: Normal breath sounds. No wheezing or rales.  Abdominal:     General: There is no  distension.     Palpations: Abdomen is soft.     Tenderness: There is no abdominal tenderness. There is no guarding or rebound.  Musculoskeletal:        General: No tenderness. Normal range of motion.     Cervical back: Normal range of motion and neck supple.     Right lower leg: No edema.     Left lower leg: No edema.  Skin:    General: Skin is warm and  dry.     Findings: No erythema or rash.  Neurological:     Mental Status: He is alert and oriented to person, place, and time.  Psychiatric:        Behavior: Behavior normal.     Comments: Slightly anxious     ED Results / Procedures / Treatments   Labs (all labs ordered are listed, but only abnormal results are displayed) Labs Reviewed  BASIC METABOLIC PANEL - Abnormal; Notable for the following components:      Result Value   Glucose, Bld 109 (*)    All other components within normal limits  CBC - Abnormal; Notable for the following components:   Hemoglobin 17.5 (*)    All other components within normal limits  TROPONIN I (HIGH SENSITIVITY)  TROPONIN I (HIGH SENSITIVITY)    EKG EKG Interpretation  Date/Time:  Tuesday July 12 2022 08:55:02 EST Ventricular Rate:  79 PR Interval:  184 QRS Duration: 100 QT Interval:  401 QTC Calculation: 460 R Axis:   -7 Text Interpretation: Sinus rhythm Inferior infarct, old Anterior infarct, old No significant change since last tracing Confirmed by Blanchie Dessert [phone]) on 07/12/2022 9:05:49 AM  Radiology DG Chest Port 1 View  Result Date: 07/12/2022 CLINICAL DATA:  Chest pain EXAM: PORTABLE CHEST 1 VIEW COMPARISON:  CXR 06/03/21 FINDINGS: No pleural effusion. No pneumothorax. No focal airspace opacity. No radiographically apparent displaced rib fractures. Visualized upper abdomen is unremarkable. IMPRESSION: No focal airspace opacity. Electronically Signed   By: Marin Roberts M.D.   On: 07/12/2022 09:22    Procedures Procedures    Medications Ordered in ED Medications   nitroGLYCERIN (NITROSTAT) SL tablet 0.4 mg (0.4 mg Sublingual Given 07/12/22 [id number])    ED Course/ Medical Decision Making/ A&P                             Medical Decision Making Amount and/or Complexity of Data Reviewed External Data Reviewed: notes.    Details: cardiology Labs: ordered. Decision-making details documented in ED Course. Radiology: ordered and independent interpretation performed. Decision-making details documented in ED Course. ECG/medicine tests: ordered and independent interpretation performed. Decision-making details documented in ED Course.  Risk Prescription drug management. Decision regarding hospitalization.   Pt with multiple medical problems and comorbidities and presenting today with a complaint that caries a high risk for morbidity and mortality.  Here today with complaint of chest pain with high risk features for a coronary cause.  Patient so have a dysrhythmia causing his symptoms versus anemia.  He is not having any symptoms classic for infectious etiology today such as pneumonia, low suspicion for pneumothorax and patient is anticoagulated with a low suspicion for PE.  I independently interpreted patient's EKG and he does not have evidence of A-fib today.  He is in a sinus rhythm and there are no significant ST changes consistent with STEMI.  Patient had a catheterization on 08/26/2020 and at that time was found to have three-vessel disease with an LAD of 70% in the mid, left circumflex 80 to 90% proximal, RCA with 40% mid patient had PCI of the left circumflex and FloWire interrogation of the LAD.  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  Heart score of 4-5 and concerning story.  Will give NTG for pain.  ASA held as pt is on xarelto. I independently interpreted patient's labs.  CBC within normal limits, BMP without  acute findings, initial troponin 2 hours after onset of symptoms is 10.  After 1 nitroglycerin patient's pain is  significantly improved.  I have independently visualized and interpreted pt's images today. CXR wnl.  Will repeat troponin and discuss with cardiology.  12:19 PM Patient's discomfort is well at around 1.  Delta troponin is 11.  Spoke with cardiology who will admit the patient for further testing.  Patient was placed on heparin per pharmacy.  Findings discussed with the patient and his fiance.  They are comfortable with this plan. CRITICAL CARE Performed by: Johnryan Sao Total critical care time: 30 minutes Critical care time was exclusive of separately billable procedures and treating other patients. Critical care was necessary to treat or prevent imminent or life-threatening deterioration. Critical care was time spent personally by me on the following activities: development of treatment plan with patient and/or surrogate as well as nursing, discussions with consultants, evaluation of patient's response to treatment, examination of patient, obtaining history from patient or surrogate, ordering and performing treatments and interventions, ordering and review of laboratory studies, ordering and review of radiographic studies, pulse oximetry and re-evaluation of patient's condition.          Final Clinical Impression(s) / ED Diagnoses Final diagnoses:  Angina at rest    Rx / DC Orders ED Discharge Orders     None         Blanchie Dessert, MD 07/12/22 1219

## 2022-07-12 NOTE — ED Notes (Signed)
RN called 6E to give report, told the nurse receiving the pt would call back.

## 2022-07-12 NOTE — ED Notes (Signed)
[facility] here to transport pt to [facility].

## 2022-07-12 NOTE — Progress Notes (Signed)
ANTICOAGULATION CONSULT NOTE - Initial Consult  Pharmacy Consult for heparin Indication: chest pain/ACS  Allergies  Allergen Reactions   Albuterol Anaphylaxis   Bee Venom Anaphylaxis   Metaproterenol Anaphylaxis   Other Anaphylaxis    DEET   Amoxicillin-Pot Clavulanate Itching    OK with Amoxicillin   Cephalexin Other (See Comments)    Chest tightness   Amoxicillin Itching and Rash    Itching all over      Patient Measurements: Height: '6\' 1"'$  (185.4 cm) Weight: 136.1 kg (300 lb) IBW/kg (Calculated) : 79.9 Heparin Dosing Weight: 110kg  Vital Signs: Temp: 98 F (36.7 C) (03/05 1517) Temp Source: Oral (03/05 1517) BP: 142/97 (03/05 1525) Pulse Rate: 68 (03/05 1525)  Labs: Recent Labs    07/12/22 0854 07/12/22 1100 07/12/22 1831  HGB 17.5*  --   --   HCT 52.0  --   --   PLT 205  --   --   APTT  --   --  45*  HEPARINUNFRC  --   --  >1.10*  CREATININE 1.11  --   --   TROPONINIHS 10 11  --      Estimated Creatinine Clearance: 121.7 mL/min (by C-G formula based on SCr of 1.11 mg/dL).   Medical History: Past Medical History:  Diagnosis Date   Arrhythmia    Atrial fibrillation ([place])    CAD (coronary artery disease) 08/17/2020   PCI + DES to pLCx, 70% mLAD negative FFR testing (medical therapy)    CHF (congestive heart failure) (HCC)    Chronic systolic heart failure (HCC)    Gout    Hyperlipidemia    Hypertension    OSA (obstructive sleep apnea) 09/2020    Medications:  Infusions:   sodium chloride     [START ON 07/13/2022] sodium chloride     Followed by   Derrill Memo ON 07/13/2022] sodium chloride     heparin 1,500 Units/hr (07/12/22 1700)    Assessment: 53 yom presented to the ED with CP. To start IV heparin. He is on chronic xarelto for history of afib. His last dose was last night. Verified with ED provider to go ahead and start heparin gtt with a bolus now. Baseline CBC is ok and no bleeding noted.   PTT came back subtherapeutic tonight. It has not  correlated with heparin level yet. We will give a small bolus and increase rate.   Goal of Therapy:  Heparin level 0.3-0.7 units/ml aPTT 66-103 seconds Monitor platelets by anticoagulation protocol: Yes   Plan:  Heparin bolus 2000 units IV x 1 Increase heparin gtt 1700 units/hr Check heparin level/aPTT in AM Daily heparin level, aPTT and CBC  Onnie Boer, PharmD, BCIDP, AAHIVP, CPP Infectious Disease Pharmacist 07/12/2022 7:46 PM

## 2022-07-12 NOTE — H&P (Addendum)
Cardiology Admission History and Physical   Patient ID: Atwell Russello MRN: [id number]; DOB: Feb 07, 1976   Admission date: 07/12/2022  PCP:  Molli Barrows, [facility] Cardiologist:  Glori Bickers, MD  Electrophysiologist:  Vickie Epley, MD  Sleep Medicine:  Fransico Him, MD    Chief Complaint:  Chest pain  Patient Profile:   Keyshon Fonger is a 47 y.o. male with hypertension, hyperlipidemia, anxiety, paroxysmal atrial fibrillation, HFrEF, CAD status post PCI/DES x 1 to circumflex, residual disease of 70% of the LAD treated medically who is being seen 07/12/2022 for the evaluation of chest pain.  History of Present Illness:   Mr. Feinstein is a 47 year old male with past medical history noted above.  He has been followed in the advanced heart failure clinic by Dr. Haroldine Laws as well as in the EP clinic with Dr. Quentin Ore.  He presented to [facility] medical 04/2020 with palpitations shortness of breath in the setting of heavy EtOH use and found to be in atrial fibrillation with RVR.  He was placed on diltiazem as well as Xarelto.  He actually left AMA from that ED visit.  Shortly afterwards presented to [facility] ED with heart failure symptoms.  EKG again showed atrial fibrillation with RVR.  He was placed on metoprolol as well as losartan with plans to reassess in 3 weeks for possible cardioversion.  He was seen in the EP clinic on 1/22 and sent back to the ED given A-fib RVR with volume overload.  He was started on amiodarone drip and milrinone due to low output cardiogenic shock.  Echocardiogram showed LVEF of 10 to 15%.  Diuresed with IV Lasix.  After amiodarone load he was successfully cardioverted.  Echo 07/2020 with LVEF of 45 to 50%.  Underwent left and right heart cath 08/2020 where he was found to have a proximal LAD lesion of 70% and ostial circumflex lesion of 80%.  Underwent PCI/DES times one of the circumflex with RFR completed on the LAD  of 0.93% which was not flow-limiting.  LVEDP reported at 4.   He was last seen in the advanced heart failure clinic 02/2021 and reported being in his usual state of health.  He did have occasional swelling but was not following his diet well.  He was continued on carvedilol 6.25 mg twice daily, spironolactone 25 mg daily, Lisabeth Register 97-103 twice daily, Lasix 20 mg as needed Plavix, Xarelto.  Blood pressures were noted to be elevated and doxazosin 1 mg at bedtime was added to his regimen.  He presented to the [facility] on 3/5 with complaints of chest pain.  Reports he woke up this morning and shortly thereafter developed left-sided chest discomfort, heavy sensation.  Does have sublingual nitroglycerin but unfortunately is expired.  He has never had anginal symptoms in the past and was quite concerned therefore presented to the ED for further evaluation.  Labs showed sodium 138, potassium 4.8, creatinine 1.1, high-sensitivity troponin 10>>11, WBC 8.1, hemoglobin 17.5.  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  Chest x-ray negative.  Given concerning features of angina he was started on IV heparin and transferred to [facility] for further evaluation.  Past Medical History:  Diagnosis Date   Arrhythmia    Atrial fibrillation ([place])    CAD (coronary artery disease) 08/17/2020   PCI + DES to pLCx, 70% mLAD negative FFR testing (medical therapy)    CHF (congestive heart failure) ([place])  Chronic systolic heart failure (HCC)    Gout    Hyperlipidemia    Hypertension    OSA (obstructive sleep apnea) 09/2020    Past Surgical History:  Procedure Laterality Date   CARDIOVERSION N/A 05/15/2020   Procedure: CARDIOVERSION;  Surgeon: Jolaine Artist, MD;  Location: [facility] ENDOSCOPY;  Service: Cardiovascular;  Laterality: N/A;   CORONARY STENT INTERVENTION N/A 08/17/2020   Procedure: CORONARY STENT INTERVENTION;  Surgeon: Burnell Blanks, MD;  Location: [facility] CV LAB;  Service: Cardiovascular;  Laterality: N/A;   RIGHT/LEFT HEART CATH AND CORONARY ANGIOGRAPHY N/A 08/17/2020   Procedure: RIGHT/LEFT HEART CATH AND CORONARY ANGIOGRAPHY;  Surgeon: Jolaine Artist, MD;  Location: [facility] CV LAB;  Service: Cardiovascular;  Laterality: N/A;   SEPTOPLASTY       Medications Prior to Admission: Prior to Admission medications   Medication Sig Start Date End Date Taking? Authorizing Provider  acetaminophen (TYLENOL) 500 MG tablet Take 500 mg by mouth every 6 (six) hours as needed for moderate pain.   Yes [provider]  allopurinol (ZYLOPRIM) 100 MG tablet Take 100 mg by mouth daily. 02/13/20  Yes [provider]  amiodarone (PACERONE) 200 MG tablet Take 1 tablet (200 mg total) by mouth daily. 09/23/21  Yes Bensimhon, Shaune Pascal, MD  buPROPion (WELLBUTRIN XL) 300 MG 24 hr tablet Take 300 mg by mouth daily. 02/13/20  Yes [provider]  carvedilol (COREG) 6.25 MG tablet TAKE 1 TABLET BY MOUTH 2 TIMES DAILY WITH A MEAL. 11/20/20  Yes Bensimhon, Shaune Pascal, MD  clonazePAM (KLONOPIN) 0.5 MG tablet Take 0.5 mg by mouth 2 (two) times daily as needed for anxiety. 11/27/19  Yes [provider]  colchicine 0.6 MG tablet Take 0.6 mg by mouth 2 (two) times daily as needed (gout). 02/28/20  Yes [provider]  dapagliflozin propanediol (FARXIGA) 10 MG TABS tablet Take 1 tablet (10 mg total) by mouth daily. 08/04/21  Yes Turner, Eber Hong, MD  doxazosin (CARDURA) 1 MG tablet TAKE 1 TABLET BY MOUTH AT BEDTIME. 05/16/22  Yes Bensimhon, Shaune Pascal, MD  fenofibrate (TRICOR) 145 MG tablet Take 145 mg by mouth daily. 02/13/20  Yes [provider]  furosemide (LASIX) 20 MG tablet TAKE 1 TABLET BY MOUTH AS NEEDED FOR FLUID OR EDEMA 06/06/22  Yes Turner, Eber Hong, MD  Multiple Vitamin (MULTIVITAMIN WITH MINERALS) TABS tablet Take 1 tablet by mouth daily.   Yes [provider]  potassium chloride SA (KLOR-CON) 20 MEQ tablet  Take 1 tablet (20 mEq total) by mouth as needed (with Lasix). 05/26/20  Yes Simmons, Brittainy M, PA-C  rivaroxaban (XARELTO) 20 MG TABS tablet TAKE 1 TABLET BY MOUTH EVERY DAY 01/06/22  Yes Bensimhon, Shaune Pascal, MD  rosuvastatin (CRESTOR) 40 MG tablet Take 1 tablet (40 mg total) by mouth daily. 05/21/21  Yes Simmons, Brittainy M, PA-C  sacubitril-valsartan (ENTRESTO) 97-103 MG TAKE 1 TABLET BY MOUTH TWICE A DAY 07/05/22  Yes Turner, Eber Hong, MD  Semaglutide-Weight Management (WEGOVY) 1 MG/0.5ML SOAJ Inject 1 mg into the skin once a week. 12/24/21  Yes Bensimhon, Shaune Pascal, MD  spironolactone (ALDACTONE) 25 MG tablet TAKE 1 TABLET (25 MG TOTAL) BY MOUTH DAILY. 05/16/22  Yes Rosita Fire, Brittainy M, PA-C  traMADol (ULTRAM) 50 MG tablet Take 50 mg by mouth 3 (three) times daily as needed for moderate pain. 02/13/20  Yes [provider]  clopidogrel (PLAVIX) 75 MG tablet TAKE 1 TABLET BY MOUTH EVERY DAY Patient not taking:  Reported on 07/12/2022 11/03/21   Lyda Jester M, PA-C  hydrALAZINE (APRESOLINE) 50 MG tablet Take 1 tablet (50 mg total) by mouth 3 (three) times daily for 30 doses. 06/03/21 06/21/21  Wyvonnia Dusky, MD  nitroGLYCERIN (NITROSTAT) 0.4 MG SL tablet Place 1 tablet (0.4 mg total) under the tongue every 5 (five) minutes as needed for chest pain. 08/17/20 08/17/21  Bensimhon, Shaune Pascal, MD  digoxin (LANOXIN) 0.125 MG tablet Take 1 tablet (0.125 mg total) by mouth daily. 06/15/20 08/03/20  Consuelo Pandy, PA-C     Allergies:    Allergies  Allergen Reactions   Albuterol Anaphylaxis   Bee Venom Anaphylaxis   Metaproterenol Anaphylaxis   Other Anaphylaxis    DEET   Amoxicillin-Pot Clavulanate Itching    OK with Amoxicillin   Cephalexin Other (See Comments)    Chest tightness   Amoxicillin Itching and Rash    Itching all over      Social History:   Social History   Socioeconomic History   Marital status: Divorced    Spouse name: Not on file   Number of children: 2   Years  of education: 16   Highest education level: Bachelor's degree (e.g., BA, AB, BS)  Occupational History   Not on file  Tobacco Use   Smoking status: Never   Smokeless tobacco: Never  Substance and Sexual Activity   Alcohol use: Yes    Comment: occ   Drug use: Not Currently   Sexual activity: Not on file  Other Topics Concern   Not on file  Social History Narrative   Not on file   Social Determinants of Health   Financial Resource Strain: Not on file  Food Insecurity: No Food Insecurity (07/12/2022)   Hunger Vital Sign    Worried About Running Out of Food in the Last Year: Never true    Ran Out of Food in the Last Year: Never true  Transportation Needs: No Transportation Needs (07/12/2022)   PRAPARE - Hydrologist (Medical): No    Lack of Transportation (Non-Medical): No  Physical Activity: Not on file  Stress: Not on file  Social Connections: Not on file  Intimate Partner Violence: Not At Risk (07/12/2022)   Humiliation, Afraid, Rape, and Kick questionnaire    Fear of Current or Ex-Partner: No    Emotionally Abused: No    Physically Abused: No    Sexually Abused: No    Family History:   The patient's family history includes COPD in his mother.    ROS:  Please see the history of present illness.  All other ROS reviewed and negative.     Physical Exam/Data:   Vitals:   07/12/22 1300 07/12/22 1315 07/12/22 1422 07/12/22 1517  BP: 120/86 (!) 131/101 116/82 (!) 149/100  Pulse: 75 68 70   Resp: '15 16 20 18  '$ Temp:    98 F (36.7 C)  TempSrc:    Oral  SpO2: 96% 98% 95% 99%  Weight:    136.1 kg  Height:    '6\' 1"'$  (1.854 m)   No intake or output data in the 24 hours ending 07/12/22 1653    07/12/2022    3:17 PM 07/12/2022   12:23 PM 03/22/2022    9:12 AM  Last 3 Weights  Weight (lbs) 300 lb 300 lb 296 lb  Weight (kg) 136.079 kg 136.079 kg 134.265 kg     Body mass index is 39.58 kg/m.  General:  Obese male, sitting up in bed. 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 Mid LAD lesion is 70% stenosed. A drug-eluting stent was successfully placed using a STENT RESOLUTE ONYX 4.0X26. Post intervention, there is a 0% residual stenosis.   1. Severe proximal Circumflex stenosis 2. Successful PTCA/DES x 1 proximal Circumflex 3. Pressure wire analysis of the mid LAD. RFR is 0.93 suggesting the mid stenosis is not flow limiting.    Recommendations: Will continue DAPT with ASA and Plavix for at least one month. His ASA could be stopped in one month since he will also be on Xarelto. I would continue the Plavix for at least six months.   Diagnostic Dominance: Right  Intervention   Echo: 02/2021  IMPRESSIONS     1. Left ventricular ejection fraction, by estimation, is 55 to 60%. The  left ventricle has normal function. The left ventricle has no regional  wall motion abnormalities. The left ventricular internal cavity size was  mildly dilated. Left ventricular  diastolic parameters were normal.   2. Right ventricular systolic function is normal. The right ventricular  size is normal.   3. The mitral valve is normal in structure. Trivial mitral valve  regurgitation.   4. The aortic valve is normal in structure. Aortic valve regurgitation is  not visualized. No aortic stenosis is present.   5. Aortic dilatation noted. There  is mild dilatation of the ascending  aorta, measuring 42 mm.   FINDINGS   Left Ventricle: Left ventricular ejection fraction, by estimation, is 55  to 60%. The left ventricle has normal function. The left ventricle has no  regional wall motion abnormalities. The left ventricular internal cavity  size was mildly dilated. There is   no left ventricular hypertrophy. Left ventricular diastolic parameters  were normal.   Right Ventricle: The right ventricular size is normal. Right vetricular  wall thickness was not well visualized. Right ventricular systolic  function is normal.   Left Atrium: Left atrial size was normal in size.   Right Atrium: Right atrial size was normal in size.   Pericardium: There is no evidence of pericardial effusion.   Mitral Valve: The mitral valve is normal in structure. Trivial mitral  valve regurgitation.   Tricuspid Valve: The tricuspid valve is normal in structure. Tricuspid  valve regurgitation is trivial.   Aortic Valve: The aortic valve is normal in structure. Aortic valve  regurgitation is not visualized. No aortic stenosis is present.   Pulmonic Valve: The pulmonic valve was grossly normal. Pulmonic valve  regurgitation is mild.   Aorta: Aortic dilatation noted. There is mild dilatation of the ascending  aorta, measuring 42 mm.   IAS/Shunts: The interatrial septum was not well visualized.    Laboratory Data:  High Sensitivity Troponin:   Recent Labs  Lab 07/12/22 0854 07/12/22 1100  TROPONINIHS 10 11      Chemistry  Recent Labs  Lab 07/12/22 0854  NA 138  K 4.8  CL 106  CO2 23  GLUCOSE 109*  BUN 20  CREATININE 1.11  CALCIUM 10.0  GFRNONAA >60  ANIONGAP 9    No results for input(s): "PROT", "ALBUMIN", "AST", "ALT", "ALKPHOS", "BILITOT" in the last 168 hours. Lipids No results for input(s): "CHOL", "TRIG", "HDL", "LABVLDL", "LDLCALC", "CHOLHDL" in the last 168 hours. Hematology Recent Labs  Lab 07/12/22 0854  WBC 8.1   RBC 5.76  HGB 17.5*  HCT 52.0  MCV 90.3  MCH 30.4  MCHC 33.7  RDW 13.0  PLT 205   Thyroid No results for input(s): "TSH", "FREET4" in the last 168 hours. BNPNo results for input(s): "BNP", "PROBNP" in the last 168 hours.  DDimer No results for input(s): "DDIMER" in the last 168 hours.   Radiology/Studies:  DG Chest Port 1 View  Result Date: 07/12/2022 CLINICAL DATA:  Chest pain EXAM: PORTABLE CHEST 1 VIEW COMPARISON:  CXR 06/03/21 FINDINGS: No pleural effusion. No pneumothorax. No focal airspace opacity. No radiographically apparent displaced rib fractures. Visualized upper abdomen is unremarkable. IMPRESSION: No focal airspace opacity. Electronically Signed   By: Marin Roberts M.D.   On: 07/12/2022 09:22     Assessment and Plan:   Mickael Napoles is a 47 y.o. male with hypertension, hyperlipidemia, anxiety, paroxysmal atrial fibrillation, HFrEF, CAD status post PCI/DES x 1 to circumflex, residual disease of 70% of the LAD treated medically who is being seen 07/12/2022 for the evaluation of chest pain.  Unstable angina CAD s/p of circumflex '22, LAD disease treated medically -- Developed left-sided comfort this morning which lingered until arriving at the ED.  High-sensitivity troponin 10>> 11.  EKG without ischemic changes.  He does have known residual LAD disease of 70% which was negative via RFR cath 08/2020.  Given his symptoms and concern for unstable angina we will plan for cardiac catheterization tomorrow. NPO at midnight. -- Continue IV heparin, aspirin, statin, beta-blocker -- Check echocardiogram  Shared Decision Making/Informed Consent The risks [stroke (1 in 1000), death (1 in 1000), kidney failure [usually temporary] (1 in 500), bleeding (1 in 200), allergic reaction [possibly serious] (1 in 200)], benefits (diagnostic support and management of coronary artery disease) and alternatives of a cardiac catheterization were discussed in detail with Mr. Maille and he is willing to  proceed.  HFrEF -- EF as low as 10 to 15% 05/2020, repeat echo 02/2021 with LVEF of 55 to 60% with normal RV.  Signs of volume overload on exam. -- GDMT: Continue Coreg 6.25 mg twice daily, Farxiga, spironolactone.  Will hold Entresto with plans for cardiac catheterization tomorrow, resume post cath  Paroxysmal atrial fibrillation -- Currently in sinus rhythm -- Hold Xarelto with plans for cardiac catheterization, continue IV heparin.  Last dose of Xarelto was 9 PM 3/4 -- Continue amiodarone 200 mg daily  Hypertension -- Blood pressures somewhat elevated while in the ED.  Not had his daily medication. -- Continue Coreg 6.25 mg twice daily, spironolactone  Hyperlipidemia -- Continue Crestor 40 mg daily, fenofibrate  OSA -- Continue CPAP  Risk Assessment/Risk Scores:   TIMI Risk Score for Unstable Angina or Non-ST Elevation MI:   The patient's TIMI risk score is 2, which indicates a 8% risk of all cause mortality, new or recurrent myocardial infarction or need for urgent revascularization in the next 14 days.  CHA2DS2-VASc Score = 3  This indicates a 3.2% annual risk of stroke. The patient's score is based upon:  CHF History: 1 HTN History: 1 Diabetes History: 0 Stroke History: 0 Vascular Disease History: 1 Age Score: 0 Gender Score: 0  Severity of Illness: The appropriate patient status for this patient is INPATIENT. Inpatient status is judged to be reasonable and necessary in order to provide the required intensity of service to ensure the patient's safety. The patient's presenting symptoms, physical exam findings, and initial radiographic and laboratory data in the context of their chronic comorbidities is felt to place them at high risk for further clinical deterioration. Furthermore, it is not anticipated that the patient will be medically stable for discharge from the hospital within 2 midnights of admission.   * I certify that at the point of admission it is my clinical  judgment that the patient will require inpatient hospital care spanning beyond 2 midnights from the point of admission due to high intensity of service, high risk for further deterioration and high frequency of surveillance required.*   For questions or updates, please contact [facility] Please consult [email or web address] for contact info under     Signed, Reino Bellis, NP  07/12/2022 4:53 PM   Personally seen and examined. Agree with APP above with the following comments:  Briefly 47 yo M with CAD s/p PCI DES, residual 70% LAD disease medical managed, HTN, HFrEF, PAF with sudden onset chest pain with a history of distant heavy alcohol use.  2021 was in tachy-mediated cardiogenic shock.  LV recover in 2022 to mild LV function.    As of last visit (Pharm D 03/2022.  Patient was able to engage in exercise again. LDL 37. Presented with sudden onset CP and chest pressure.  Improved with nitroglycerin but did not resolve.  Given his known mid LAD disease and his chest pain that had not resolved, transferred to [facility]  Patient notes his pain is a 1/10 currently.  He is eager to get LHC done to rule out worsening disease.  No SOB, no palpitations.  He had has no bleeding issues and had none when he had LCX PCI. Exam is as above. Thick wrists but Dopplerable pulses.   Labs notable for troponin < 18 X2 EKG SR with 1st HB and inferior and anterior Q wavs  Would recommend  - Prox mid LAD disease with sudden onset CT; given risks LHC is reasonable - creatinine has been improving Entresto restart likely post cath - continue other medication; adding Imdur 30 mg PO daily - Last Louanna Raw 07/11/22, on heparin currently - Post cath if LVEDP elevated, will get small dose IV lasix - Full CODE - Cardiac diet until midnight then NPO - heparin for DVT PPX  Rudean Haskell, MD [place]  [address] [place], [place] [address] [phone]  5:48  PM

## 2022-07-12 NOTE — ED Notes (Signed)
ED Provider at bedside. 

## 2022-07-12 NOTE — ED Triage Notes (Signed)
Pt reports chest pressure/squeezing that started this morning. Pt denies SHOB.

## 2022-07-12 NOTE — ED Notes (Signed)
ED TO INPATIENT HANDOFF REPORT  ED Nurse Name and Phone #: Isaak Delmundo RN  S Name/Age/Gender Alexander [name] 47 y.o. male Room/Bed: DB014/DB014  Code Status   Code Status: Prior  Home/SNF/Other Home Patient oriented to: self, place, time, and situation Is this baseline? Yes   Triage Complete: Triage complete  Chief Complaint Unstable angina ([place]) [I20.0]  Triage Note Pt reports chest pressure/squeezing that started this morning. Pt denies SHOB.    Allergies Allergies  Allergen Reactions   Albuterol Anaphylaxis   Bee Venom Anaphylaxis   Metaproterenol Anaphylaxis   Other Anaphylaxis    DEET   Amoxicillin-Pot Clavulanate Itching    OK with Amoxicillin   Cephalexin Other (See Comments)    Chest tightness   Amoxicillin Itching and Rash    Itching all over      Level of Care/Admitting Diagnosis ED Disposition     ED Disposition  Admit   Condition  --   Comment  Hospital Area: [place] [[id number]]  Level of Care: Telemetry Cardiac [103]  Interfacility transfer: Yes  May place patient in observation at [facility] or [place] if equivalent level of care is available:: No  Covid Evaluation: Asymptomatic - no recent exposure (last 10 days) testing not required  Diagnosis: Unstable angina [facility]) [id number]  Admitting Physician: Werner Lean [id number]  Attending Physician: Werner Lean [id number]          B Medical/Surgery History Past Medical History:  Diagnosis Date   Arrhythmia    Atrial fibrillation ([place])    CAD (coronary artery disease) 08/17/2020   PCI + DES to pLCx, 70% mLAD negative FFR testing (medical therapy)    CHF (congestive heart failure) ([place])    Chronic systolic heart failure ([facility])    Gout    Hyperlipidemia    Hypertension    OSA (obstructive sleep apnea) 09/2020   Past Surgical History:  Procedure Laterality Date   CARDIOVERSION N/A 05/15/2020   Procedure: CARDIOVERSION;  Surgeon: Jolaine Artist, MD;  Location: [place];  Service: Cardiovascular;  Laterality: N/A;   CORONARY STENT INTERVENTION N/A 08/17/2020   Procedure: CORONARY STENT INTERVENTION;  Surgeon: Burnell Blanks, MD;  Location: [place] [facility];  Service: Cardiovascular;  Laterality: N/A;   RIGHT/LEFT HEART CATH AND CORONARY ANGIOGRAPHY N/A 08/17/2020   Procedure: RIGHT/LEFT HEART CATH AND CORONARY ANGIOGRAPHY;  Surgeon: Jolaine Artist, MD;  Location: [place] [facility];  Service: Cardiovascular;  Laterality: N/A;   SEPTOPLASTY       A IV Location/Drains/Wounds Patient Lines/Drains/Airways Status     Active Line/Drains/Airways     Name Placement date Placement time Site Days   Peripheral IV 07/12/22 20 G Left Antecubital 07/12/22  0915  Antecubital  less than 1   Peripheral IV 07/12/22 20 G Left;Posterior Forearm 07/12/22  1250  Forearm  less than 1            Intake/Output Last 24 hours No intake or output data in the 24 hours ending 07/12/22 1343  Labs/Imaging Results for orders placed or performed during the hospital encounter of 07/12/22 (from the past 48 hour(s))  Basic metabolic panel     Status: Abnormal   Collection Time: 07/12/22  8:54 AM  Result Value Ref Range   Sodium 138 135 - 145 mmol/L   Potassium 4.8 3.5 - 5.1 mmol/L   Chloride 106 98 - 111 mmol/L   CO2 23 22 - 32 mmol/L   Glucose, Bld 109 (H)  70 - 99 mg/dL    Comment: Glucose reference range applies only to samples taken after fasting for at least 8 hours.   BUN 20 6 - 20 mg/dL   Creatinine, Ser 1.11 0.61 - 1.24 mg/dL   Calcium 10.0 8.9 - 10.3 mg/dL   GFR, Estimated >60 >60 mL/min    Comment: (NOTE) Calculated using the CKD-EPI Creatinine Equation (2021)    Anion gap 9 5 - 15    Comment: Performed at [facility], [address], [place], [place] [address]  CBC     Status: Abnormal   Collection Time: 07/12/22  8:54 AM  Result Value Ref Range   WBC 8.1 4.0 - 10.5 K/uL   RBC 5.76 4.22 - 5.81  MIL/uL   Hemoglobin 17.5 (H) 13.0 - 17.0 g/dL   HCT 52.0 39.0 - 52.0 %   MCV 90.3 80.0 - 100.0 fL   MCH 30.4 26.0 - 34.0 pg   MCHC 33.7 30.0 - 36.0 g/dL   RDW 13.0 11.5 - 15.5 %   Platelets 205 150 - 400 K/uL   nRBC 0.0 0.0 - 0.2 %    Comment: Performed at [facility], [address] [place], [place] [address]  Troponin I (High Sensitivity)     Status: None   Collection Time: 07/12/22  8:54 AM  Result Value Ref Range   Troponin I (High Sensitivity) 10 <18 ng/L    Comment: (NOTE) Elevated high sensitivity troponin I (hsTnI) values and significant  changes across serial measurements may suggest ACS but many other  chronic and acute conditions are known to elevate hsTnI results.  Refer to the "Links" section for chest pain algorithms and additional  guidance. Performed at [facility], [address] [place], [place] [address]   Troponin I (High Sensitivity)     Status: None   Collection Time: 07/12/22 11:00 AM  Result Value Ref Range   Troponin I (High Sensitivity) 11 <18 ng/L    Comment: (NOTE) Elevated high sensitivity troponin I (hsTnI) values and significant  changes across serial measurements may suggest ACS but many other  chronic and acute conditions are known to elevate hsTnI results.  Refer to the "Links" section for chest pain algorithms and additional  guidance. Performed at [facility], [address], [place],  [address]    DG Chest Port 1 View  Result Date: 07/12/2022 CLINICAL DATA:  Chest pain EXAM: PORTABLE CHEST 1 VIEW COMPARISON:  CXR 06/03/21 FINDINGS: No pleural effusion. No pneumothorax. No focal airspace opacity. No radiographically apparent displaced rib fractures. Visualized upper abdomen is unremarkable. IMPRESSION: No focal airspace opacity. Electronically Signed   By: Marin Roberts M.D.   On: 07/12/2022 09:22    Pending Labs Unresulted Labs (From admission, onward)     Start      Ordered   07/12/22 1900  Heparin level (unfractionated)  Once-Timed,   URGENT        07/12/22 1228   07/12/22 1900  APTT  Once-Timed,   STAT        07/12/22 1228            Vitals/Pain Today's Vitals   07/12/22 1245 07/12/22 1258 07/12/22 1300 07/12/22 1315  BP: (!) 143/94  120/86 (!) 131/101  Pulse: 68  75 68  Resp: '14  15 16  '$ Temp:  98.4 F (36.9 C)    TempSrc:  Oral    SpO2: 98%  96% 98%  Weight:  Height:      PainSc:        Isolation Precautions No active isolations  Medications Medications  nitroGLYCERIN (NITROSTAT) SL tablet 0.4 mg (0.4 mg Sublingual Given 07/12/22 0917)  heparin ADULT infusion 100 units/mL (25000 units/221m) (1,500 Units/hr Intravenous New Bag/Given 07/12/22 1254)  heparin bolus via infusion 4,000 Units (4,000 Units Intravenous Bolus from Bag 07/12/22 1255)    Mobility walks     Focused Assessments Cardiac Assessment Handoff:  Cardiac Rhythm: Normal sinus rhythm No results found for: "CKTOTAL", "CKMB", "CKMBINDEX", "TROPONINI" No results found for: "DDIMER" Does the Patient currently have chest pain? No    R Recommendations: See Admitting Provider Note  Report given to:   Additional Notes:

## 2022-07-12 NOTE — ED Notes (Signed)
RN gave report to [employer]

## 2022-07-13 ENCOUNTER — Other Ambulatory Visit (HOSPITAL_COMMUNITY): Payer: BC Managed Care – PPO

## 2022-07-13 ENCOUNTER — Encounter (HOSPITAL_COMMUNITY): Payer: Self-pay | Admitting: Cardiology

## 2022-07-13 ENCOUNTER — Inpatient Hospital Stay (HOSPITAL_COMMUNITY)
Admission: EM | Disposition: A | Discharge status: Home / Self Care | Payer: Self-pay | Source: Home / Self Care | Attending: Internal Medicine

## 2022-07-13 DIAGNOSIS — I480 Paroxysmal atrial fibrillation: Secondary | ICD-10-CM

## 2022-07-13 DIAGNOSIS — I25110 Atherosclerotic heart disease of native coronary artery with unstable angina pectoris: Principal | ICD-10-CM

## 2022-07-13 HISTORY — PX: CORONARY IMAGING/OCT: CATH118326

## 2022-07-13 HISTORY — PX: LEFT HEART CATH AND CORONARY ANGIOGRAPHY: CATH118249

## 2022-07-13 HISTORY — PX: CORONARY STENT INTERVENTION: CATH118234

## 2022-07-13 LAB — LIPID PANEL
Cholesterol: 87 mg/dL (ref 0–200)
HDL: 28 mg/dL — ABNORMAL LOW (ref >40)
LDL Cholesterol: 12 mg/dL (ref 0–99)
Total CHOL/HDL Ratio: 3.1 RATIO
Triglycerides: 237 mg/dL — ABNORMAL HIGH (ref <150)
VLDL: 47 mg/dL — ABNORMAL HIGH (ref 0–40)

## 2022-07-13 LAB — CBC
HCT: 45.5 % (ref 39.0–52.0)
Hemoglobin: 15.3 g/dL (ref 13.0–17.0)
MCH: 30.4 pg (ref 26.0–34.0)
MCHC: 33.6 g/dL (ref 30.0–36.0)
MCV: 90.5 fL (ref 80.0–100.0)
Platelets: 218 10*3/uL (ref 150–400)
RBC: 5.03 MIL/uL (ref 4.22–5.81)
RDW: 13 % (ref 11.5–15.5)
WBC: 8.8 10*3/uL (ref 4.0–10.5)
nRBC: 0 % (ref 0.0–0.2)

## 2022-07-13 LAB — BASIC METABOLIC PANEL
Anion gap: 9 (ref 5–15)
BUN: 18 mg/dL (ref 6–20)
CO2: 27 mmol/L (ref 22–32)
Calcium: 9.1 mg/dL (ref 8.9–10.3)
Chloride: 104 mmol/L (ref 98–111)
Creatinine, Ser: 1.35 mg/dL — ABNORMAL HIGH (ref 0.61–1.24)
GFR, Estimated: >60 mL/min (ref >60)
Glucose, Bld: 114 mg/dL — ABNORMAL HIGH (ref 70–99)
Potassium: 3.6 mmol/L (ref 3.5–5.1)
Sodium: 140 mmol/L (ref 135–145)

## 2022-07-13 LAB — POCT ACTIVATED CLOTTING TIME
Activated Clotting Time: 298 seconds
Activated Clotting Time: 309 seconds

## 2022-07-13 LAB — APTT: aPTT: 63 seconds — ABNORMAL HIGH (ref 24–36)

## 2022-07-13 LAB — HEPARIN LEVEL (UNFRACTIONATED): Heparin Unfractionated: >1.1 IU/mL — ABNORMAL HIGH (ref 0.30–0.70)

## 2022-07-13 SURGERY — LEFT HEART CATH AND CORONARY ANGIOGRAPHY
Anesthesia: LOCAL

## 2022-07-13 MED ORDER — HYDRALAZINE HCL 20 MG/ML IJ SOLN: 10.0000 mg | INTRAMUSCULAR | Status: AC | PRN

## 2022-07-13 MED ORDER — HEPARIN SODIUM (PORCINE) 1000 UNIT/ML IJ SOLN
INTRAMUSCULAR | Status: AC
  Filled 2022-07-13: qty 10

## 2022-07-13 MED ORDER — LIDOCAINE HCL (PF) 1 % IJ SOLN
INTRAMUSCULAR | Status: DC | PRN
  Administered 2022-07-13: 2 mL

## 2022-07-13 MED ORDER — HEPARIN (PORCINE) IN NACL 1000-0.9 UT/500ML-% IV SOLN
INTRAVENOUS | Status: DC | PRN
  Administered 2022-07-13 (×2): 500 mL

## 2022-07-13 MED ORDER — NITROGLYCERIN 1 MG/10 ML FOR IR/CATH LAB
INTRA_ARTERIAL | Status: AC
  Filled 2022-07-13: qty 10

## 2022-07-13 MED ORDER — FENTANYL CITRATE (PF) 100 MCG/2ML IJ SOLN
INTRAMUSCULAR | Status: AC
  Filled 2022-07-13: qty 2

## 2022-07-13 MED ORDER — IOHEXOL 350 MG/ML SOLN
INTRAVENOUS | Status: DC | PRN
  Administered 2022-07-13: 180 mL

## 2022-07-13 MED ORDER — RIVAROXABAN 20 MG PO TABS
20.0000 mg | ORAL_TABLET | Freq: Every day | ORAL | Status: DC
  Administered 2022-07-13 – 2022-07-14 (×2): 20 mg via ORAL
  Filled 2022-07-13 (×2): qty 1

## 2022-07-13 MED ORDER — MIDAZOLAM HCL 2 MG/2ML IJ SOLN
INTRAMUSCULAR | Status: DC | PRN
  Administered 2022-07-13 (×2): 1 mg via INTRAVENOUS

## 2022-07-13 MED ORDER — VERAPAMIL HCL 2.5 MG/ML IV SOLN
INTRAVENOUS | Status: AC
  Filled 2022-07-13: qty 2

## 2022-07-13 MED ORDER — CLOPIDOGREL BISULFATE 75 MG PO TABS
75.0000 mg | ORAL_TABLET | Freq: Every day | ORAL | Status: DC
  Administered 2022-07-14: 75 mg via ORAL
  Filled 2022-07-13: qty 1

## 2022-07-13 MED ORDER — CLOPIDOGREL BISULFATE 300 MG PO TABS
ORAL_TABLET | ORAL | Status: AC
  Filled 2022-07-13: qty 2

## 2022-07-13 MED ORDER — FAMOTIDINE IN NACL 20-0.9 MG/50ML-% IV SOLN
INTRAVENOUS | Status: AC
  Filled 2022-07-13: qty 50

## 2022-07-13 MED ORDER — LIDOCAINE HCL (PF) 1 % IJ SOLN
INTRAMUSCULAR | Status: AC
  Filled 2022-07-13: qty 30

## 2022-07-13 MED ORDER — SACUBITRIL-VALSARTAN 97-103 MG PO TABS
1.0000 | ORAL_TABLET | Freq: Two times a day (BID) | ORAL | Status: DC
  Administered 2022-07-13 – 2022-07-14 (×2): 1 via ORAL
  Filled 2022-07-13 (×3): qty 1

## 2022-07-13 MED ORDER — MIDAZOLAM HCL 2 MG/2ML IJ SOLN
INTRAMUSCULAR | Status: AC
  Filled 2022-07-13: qty 2

## 2022-07-13 MED ORDER — FENTANYL CITRATE (PF) 100 MCG/2ML IJ SOLN
INTRAMUSCULAR | Status: DC | PRN
  Administered 2022-07-13 (×3): 25 ug via INTRAVENOUS

## 2022-07-13 MED ORDER — VERAPAMIL HCL 2.5 MG/ML IV SOLN
INTRAVENOUS | Status: DC | PRN
  Administered 2022-07-13: 10 mL via INTRA_ARTERIAL

## 2022-07-13 MED ORDER — SODIUM CHLORIDE 0.9% FLUSH: 3.0000 mL | INTRAVENOUS | Status: DC | PRN

## 2022-07-13 MED ORDER — FAMOTIDINE IN NACL 20-0.9 MG/50ML-% IV SOLN
INTRAVENOUS | Status: DC | PRN
  Administered 2022-07-13: 20 mg via INTRAVENOUS

## 2022-07-13 MED ORDER — HEPARIN SODIUM (PORCINE) 1000 UNIT/ML IJ SOLN
INTRAMUSCULAR | Status: DC | PRN
  Administered 2022-07-13 (×2): 7000 [IU] via INTRAVENOUS

## 2022-07-13 MED ORDER — SODIUM CHLORIDE 0.9% FLUSH
3.0000 mL | Freq: Two times a day (BID) | INTRAVENOUS | Status: DC
  Administered 2022-07-14: 3 mL via INTRAVENOUS

## 2022-07-13 MED ORDER — SODIUM CHLORIDE 0.9 % IV SOLN: INTRAVENOUS | Status: AC

## 2022-07-13 MED ORDER — CLOPIDOGREL BISULFATE 300 MG PO TABS
ORAL_TABLET | ORAL | Status: DC | PRN
  Administered 2022-07-13: 600 mg via ORAL

## 2022-07-13 MED ORDER — SODIUM CHLORIDE 0.9 % IV SOLN: 250.0000 mL | INTRAVENOUS | Status: DC | PRN

## 2022-07-13 MED ORDER — LABETALOL HCL 5 MG/ML IV SOLN: 10.0000 mg | INTRAVENOUS | Status: AC | PRN

## 2022-07-13 SURGICAL SUPPLY — 21 items
BALLN EMERGE MR 3.0X20 (BALLOONS) ×1
BALLN ~~LOC~~ EMERGE MR 4.0X12 (BALLOONS) ×1
BALLN ~~LOC~~ EUPHORA RX 4.5X8 (BALLOONS) ×1
BALLOON EMERGE MR 3.0X20 (BALLOONS) IMPLANT
BALLOON ~~LOC~~ EMERGE MR 4.0X12 (BALLOONS) IMPLANT
BALLOON ~~LOC~~ EUPHORA RX 4.5X8 (BALLOONS) IMPLANT
CATH 5FR JL3.5 JR4 ANG PIG MP (CATHETERS) IMPLANT
CATH DRAGONFLY OPSTAR (CATHETERS) IMPLANT
CATH VISTA GUIDE 6FR XBLAD3.5 (CATHETERS) IMPLANT
DEVICE RAD COMP TR BAND LRG (VASCULAR PRODUCTS) IMPLANT
GLIDESHEATH SLEND SS 6F .021 (SHEATH) IMPLANT
GUIDEWIRE INQWIRE 1.5J.035X260 (WIRE) IMPLANT
INQWIRE 1.5J .035X260CM (WIRE) ×1
KIT ENCORE 26 ADVANTAGE (KITS) IMPLANT
KIT HEART LEFT (KITS) ×1 IMPLANT
PACK CARDIAC CATHETERIZATION (CUSTOM PROCEDURE TRAY) ×1 IMPLANT
STENT SYNERGY XD 3.50X28 (Permanent Stent) IMPLANT
SYNERGY XD 3.50X28 (Permanent Stent) ×1 IMPLANT
TRANSDUCER W/STOPCOCK (MISCELLANEOUS) ×1 IMPLANT
TUBING CIL FLEX 10 FLL-RA (TUBING) ×1 IMPLANT
WIRE ASAHI PROWATER 180CM (WIRE) IMPLANT

## 2022-07-13 NOTE — Brief Op Note (Addendum)
BRIEF CARDIAC CATHETERIZATION - PERCUTANEOUS CORONARY INTERVENTION REPORT  07/13/2022 11:26 AM  PCP:  Molli Barrows, MD   [place] Cardiologist:  Glori Bickers, MD  Electrophysiologist:  Vickie Epley, MD  Sleep Medicine:  Fransico Him, MD  Referring Cardiologist: Dr. Gasper Sells  PROCEDURE:  Procedure(s): LEFT HEART CATH AND CORONARY ANGIOGRAPHY (N/A) CORONARY STENT INTERVENTION (N/A) INTRAVASCULAR IMAGING/OCT (N/A)  SURGEON:  Surgeon(s) and Role:    * Leonie Man, MD - Primary   PATIENT:  Alexander [name] s as 47 y.o. male with hypertension, hyperlipidemia, anxiety, paroxysmal atrial fibrillation, HFrEF, CAD status post PCI/DES x 1 to LCx (with FFR negative residual LAD ~70%-medical management) who was seen 07/12/2022 by Dr. Gasper Sells in the ER for symptoms concerning for possible unstable angina.  He is referred for invasive evaluation with cardiac catheterization and possible PCI.  PRE-OPERATIVE DIAGNOSIS:  chest pain/unstable angina  POST-OPERATIVE DIAGNOSIS:   Culprit Lesion: Progression of mid LAD lesion just after diagonal branch at SP1 in progress from 70% to roughly 80% OCT guided PCI from major diagonal branch just beyond the next branch: Synergy XD 3.5 mm x 28 mm deployed to 3.7 mm and postdilated in tapered fashion from 4.6-4.2-3.7 mm Widely patent LCx stent Distal RCA 45% and ostial PDA 55 % stenosis. LV gram relatively poor filling but EF does appear to be low normal 50 to 55%.  (Deferred echo)  PROCEDURE PERFORMED Time Out: Verified patient identification, verified procedure, site/side was marked, verified correct patient position, special equipment/implants available, medications/allergies/relevent history reviewed, required imaging and test results available. Performed.  Access:  RIGHT Radial Artery: 6 Fr sheath -- Seldinger technique using Micropuncture Kit -- 10 mL radial cocktail IA; 7000 Units IV Heparin  Left Heart  Catheterization: 5Fr Catheters advanced or exchanged over a J-wire under direct fluoroscopic guidance into the ascending aorta; JR4 catheter advanced first.  * LV Hemodynamics (LV Gram): JR4 catheter * Left Coronary Artery Cineangiography: JL 3.5 catheter  * Right Coronary Artery Cineangiography: JR4 catheter   Review of initial angiography revealed: Patent stent in the LCx, progression of mid LAD disease to roughly 80%, distal RCA 40% with ostial PDA 50 to 60%.  Preparations are made for OCT guided PCI of the LAD -> Additional 7000's IV heparin administered IV, 600 mg p.o. Plavix administered  OCT guided PCI performed:  -> 6 [place] XB LAD 3.9 guide catheter, Prowater wire; Dragonfly OCT catheter for preliminary imaging showing MLA of the most significant lesion in the LAD being between 1.3 to 1.7 mm.  -> PTCA 3.0 mm x 20 mm-12 ATM X 20 SEC, 3 inflations --> Synergy XD DES 3.5 mm x 20 mm deployed to 3.7 mm; post dilation of the middle two thirds the vessel with 4.0 mm 12 mm [place] balloon-18 ATM x 20 sec, proximal stent optimization (POT) with 4.5 mm x 8 mm [place] balloon-18 ATM => taper postdilation from 4.6 mm down to 4.2 mm in the mid stent and 3.7 mm distally.  Full-full approximated and present expanded stent.  Upon completion of Angiogaphy, the catheter was removed completely out of the body over a wire, without complication.  Radial sheath removed in the Cardiac Catheterization lab with TR Band placed for hemostasis.  TR Band: 1120 Hours; 12 mL air; reverse Barbeau A  MEDICATIONS  Radial Cocktail: 3 mg Verapmil in 10 mL NS Heparin: Total of 14,000 units  ANESTHESIA:   local and IV sedation:  SQ Lidocaine 3 mL: IV: 2 mg Versed, 75  mg fentanyl  EBL:  <50   COUNTS:  YES  DICTATION: .Note written in EPIC  PATIENT DISPOSITION:  PACU - hemodynamically stable.  PLAN OF CARE:  Overnight monitoring, restart Xarelto at p.m. dosing tonight; would do 1 month of aspirin, Plavix and Xarelto and  then discontinue aspirin.  Continue Plavix for 6 months total,then discontinue     Glenetta Hew, MD

## 2022-07-13 NOTE — Interval H&P Note (Signed)
History and Physical Interval Note:  07/13/2022 9:46 AM  Alexander [name]  has presented today for surgery, with the diagnosis of chest pain/ Unstable angina  The various methods of treatment have been discussed with the patient and family. After consideration of risks, benefits and other options for treatment, the patient has consented to  Procedure(s): LEFT HEART CATH AND CORONARY ANGIOGRAPHY (N/A) PERCUTANEOUS CORONARY INTERVENTION   as a surgical intervention.  The patient's history has been reviewed, patient examined, no change in status, stable for surgery.  I have reviewed the patient's chart and labs.  Questions were answered to the patient's satisfaction.    Cath Lab Visit (complete for each Cath Lab visit)  Clinical Evaluation Leading to the Procedure:   ACS: Yes.    Non-ACS:    Anginal Classification: CCS III  Anti-ischemic medical therapy: Minimal Therapy (1 class of medications)  Non-Invasive Test Results: No non-invasive testing performed  Prior CABG: No previous CABG     Glenetta Hew

## 2022-07-13 NOTE — Discharge Summary (Incomplete)
Discharge Summary    Patient ID: Alexander [name] MRN: [id number]; DOB: October 24, 1975  Admit date: 07/12/2022 Discharge date: 07/14/2022  PCP:  Molli Barrows, MD   [place] [facility] Cardiologist:  Glori Bickers, MD  Electrophysiologist:  Vickie Epley, MD  Sleep Medicine:  Fransico Him, MD       Discharge Diagnoses    Principal Problem:   Unstable angina [facility]) Active Problems:   Atrial fibrillation with RVR ([place])   HLD (hyperlipidemia)   HTN (hypertension)   Coronary artery disease involving native coronary artery of native heart with unstable angina pectoris [facility])   Morbid obesity [facility])    Diagnostic Studies/Procedures    07/13/22 LHC    Prox LAD to Mid LAD lesion is 80% stenosed.-Progression of disease (stenosis confirmed via OCT)   A drug-eluting stent was successfully placed using a SYNERGY XD 3.50X28. ->  Deployed to 3.7 mm.  Taper postdilation from 4.6-4.22-3.7 mm = full expansion. Post intervention, there is a 0% residual stenosis.   Mid RCA lesion is 45% stenosed.  RPDA lesion is 55% stenosed.   Previously placed Ost Cx to Prox Cx stent of unknown type is  widely patent.   The left ventricular systolic function is normal.   The left ventricular ejection fraction is 50-55% by visual estimate.   There is no aortic valve stenosis.   POST-OPERATIVE DIAGNOSIS:   Culprit Lesion: Progression of mid LAD lesion just after diagonal branch at SP1 in progress from 70% to roughly 80% OCT guided PCI from major diagonal branch just beyond the next branch: Synergy XD 3.5 mm x 28 mm deployed to 3.7 mm and postdilated in tapered fashion from 4.6-4.2-3.7 mm Widely patent LCx stent Distal RCA 45% and ostial PDA 55 % stenosis. LV gram relatively poor filling but EF does appear to be low normal 50 to 55%.  (Defer to echo)     PLAN OF CARE:  Overnight monitoring, restart Xarelto at p.m. dosing tonight; would do 1 month of aspirin, Plavix and Xarelto and then  discontinue aspirin.  Continue Plavix for 6 months total,then discontinue.  Diagnostic Dominance: Right  Intervention   _____________   History of Present Illness     Alexander [name] is a 47 y.o. male with hypertension, hyperlipidemia, anxiety, paroxysmal atrial fibrillation, HFrEF, CAD status post PCI/DES x 1 to circumflex, residual disease of 70% of the LAD treated medically who was seen 07/12/2022 for the evaluation of chest pain. Patient reported onset of left sided chest pressure/heaviness. Given known CAD hx, patient became concerned and presented to [facility].  Hospital Course     Consultants: n/a   Unstable angina CAD s/p of circumflex '22, LAD disease treated medically   Patient with onset of left sided chest discomfort on 3/5 prompting ED evaluation. He was transferred to [facility] given history of known residual LAD disease of 70% (negative via RFR cath April 2022). Troponin 10->11 and EKG without acute ischemic abnormalities.    LHC on 3/6 with 80% stenosis of LAD, prox to mid. Successful PCI with DES.  At discharge, continue Coreg 6.'25mg'$  BID, Imdur '30mg'$  Triple therapy with ASA/Plavix/Xarelto x1 month, then Plavix and Xarelto for 6 months, then Xarelto monotherapy. Per Dr. Ellyn Hack, after 6 months could drop Plavix.    Acute on chronic HFrEF   Patient with LVEF reported as low as 10-15% as of January 2022. However, repeat echo in October 2022 with recovered LVEF 55-60%. Repeat echocardiogram obtained on 07/14/2022 demonstrated EF 60 to 65%,  moderate LAE, no regional wall motion abnormality, no significant valve issue   Defer diuresis until LVEDP assessed in cath today GDMT: Coreg 6.'25mg'$  BID Farxiga Spironolactone Entresto   Paroxysmal atrial fibrillation   Patient in sinus rhythm this admission. Continue Amiodarone '200mg'$  daily with Xarelto.   Hypertension   Patient initially hypertensive on presentation, now improved after having received home HF/hypertension  meds.    Hyperlipidemia   LDL at goal. Continue statin.      Did the patient have an acute coronary syndrome (MI, NSTEMI, STEMI, etc) this admission?:  No                               Did the patient have a percutaneous coronary intervention (stent / angioplasty)?:  Yes.     Cath/PCI Registry Performance & Quality Measures: Aspirin prescribed? - Yes ADP Receptor Inhibitor (Plavix/Clopidogrel, Brilinta/Ticagrelor or Effient/Prasugrel) prescribed (includes medically managed patients)? - Yes High Intensity Statin (Lipitor 40-'80mg'$  or Crestor 20-'40mg'$ ) prescribed? - Yes For EF <40%, was ACEI/ARB prescribed? - Not Applicable (EF >/= [id number]) For EF <40%, Aldosterone Antagonist (Spironolactone or Eplerenone) prescribed? - Not Applicable (EF >/= [id number]) Cardiac Rehab Phase II ordered? - Yes         _____________  Discharge Vitals Blood pressure (!) 132/93, pulse 70, temperature 97.9 F (36.6 C), temperature source Oral, resp. rate 16, height '6\' 1"'$  (1.854 m), weight 136.1 kg, SpO2 93 %.  Filed Weights   07/12/22 1223 07/12/22 1517  Weight: 136.1 kg 136.1 kg    Labs & Radiologic Studies    CBC Recent Labs    07/13/22 0238 07/14/22 0704  WBC 8.8 6.6  HGB 15.3 15.3  HCT 45.5 46.4  MCV 90.5 90.3  PLT 218 [id number]   Basic Metabolic Panel Recent Labs    07/13/22 0238 07/14/22 0704  NA 140 137  K 3.6 4.0  CL 104 106  CO2 27 25  GLUCOSE 114* 110*  BUN 18 13  CREATININE 1.35* 1.18  CALCIUM 9.1 9.0   Liver Function Tests No results for input(s): "AST", "ALT", "ALKPHOS", "BILITOT", "PROT", "ALBUMIN" in the last 72 hours. No results for input(s): "LIPASE", "AMYLASE" in the last 72 hours. High Sensitivity Troponin:   Recent Labs  Lab 07/12/22 0854 07/12/22 1100  TROPONINIHS 10 11    BNP Invalid input(s): "POCBNP" D-Dimer No results for input(s): "DDIMER" in the last 72 hours. Hemoglobin A1C Recent Labs    07/13/22 0238  HGBA1C 5.6   Fasting Lipid Panel Recent Labs     07/13/22 0238  CHOL 87  HDL 28*  LDLCALC 12  TRIG 237*  CHOLHDL 3.1   Thyroid Function Tests No results for input(s): "TSH", "T4TOTAL", "T3FREE", "THYROIDAB" in the last 72 hours.  Invalid input(s): "FREET3" _____________  ECHOCARDIOGRAM COMPLETE  Result Date: 07/14/2022    ECHOCARDIOGRAM REPORT   Patient Name:   RAYNALDO WO Date of Exam: 07/14/2022 Medical Rec #:  [id number]     Height:       73.0 in Accession #:    [id number]    Weight:       300.0 lb Date of Birth:  [birth year]     BSA:          2.556 m Patient Age:    77 years      BP:           109/75 mmHg Patient Gender: M  HR:           69 bpm. Exam Location:  Inpatient Procedure: 2D Echo, Cardiac Doppler and Color Doppler Indications:    Chest Pain R07.9  History:        Patient has prior history of Echocardiogram examinations, most                 recent 03/01/2021. CHF, Angina, Arrythmias:Atrial Fibrillation;                 Risk Factors:Hypertension and Dyslipidemia.  Sonographer:    Ronny Flurry Referring Phys: [place]  1. Left ventricular ejection fraction, by estimation, is 60 to 65%. The left ventricle has normal function. The left ventricle has no regional wall motion abnormalities. The left ventricular internal cavity size was mildly dilated. There is mild concentric left ventricular hypertrophy. Left ventricular diastolic parameters were normal.  2. Right ventricular systolic function is normal. The right ventricular size is normal.  3. Left atrial size was mildly dilated.  4. The mitral valve is normal in structure. No evidence of mitral valve regurgitation. No evidence of mitral stenosis.  5. The aortic valve is tricuspid. Aortic valve regurgitation is not visualized. No aortic stenosis is present.  6. The inferior vena cava is normal in size with greater than 50% respiratory variability, suggesting right atrial pressure of 3 mmHg. FINDINGS  Left Ventricle: Left ventricular ejection fraction,  by estimation, is 60 to 65%. The left ventricle has normal function. The left ventricle has no regional wall motion abnormalities. The left ventricular internal cavity size was mildly dilated. There is  mild concentric left ventricular hypertrophy. Left ventricular diastolic parameters were normal. Normal left ventricular filling pressure. Right Ventricle: The right ventricular size is normal. No increase in right ventricular wall thickness. Right ventricular systolic function is normal. Left Atrium: Left atrial size was mildly dilated. Right Atrium: Right atrial size was normal in size. Pericardium: There is no evidence of pericardial effusion. Mitral Valve: The mitral valve is normal in structure. No evidence of mitral valve regurgitation. No evidence of mitral valve stenosis. Tricuspid Valve: The tricuspid valve is normal in structure. Tricuspid valve regurgitation is not demonstrated. No evidence of tricuspid stenosis. Aortic Valve: The aortic valve is tricuspid. Aortic valve regurgitation is not visualized. No aortic stenosis is present. Aortic valve mean gradient measures 6.0 mmHg. Aortic valve peak gradient measures 10.6 mmHg. Aortic valve area, by VTI measures 3.39  cm. Pulmonic Valve: The pulmonic valve was normal in structure. Pulmonic valve regurgitation is not visualized. No evidence of pulmonic stenosis. Aorta: The aortic root is normal in size and structure. Venous: The inferior vena cava is normal in size with greater than 50% respiratory variability, suggesting right atrial pressure of 3 mmHg. IAS/Shunts: No atrial level shunt detected by color flow Doppler.  LEFT VENTRICLE PLAX 2D LVIDd:         5.80 cm   Diastology LVIDs:         3.60 cm   LV e' medial:    7.83 cm/s LV PW:         1.10 cm   LV E/e' medial:  10.6 LV IVS:        1.30 cm   LV e' lateral:   11.50 cm/s LVOT diam:     2.30 cm   LV E/e' lateral: 7.2 LV SV:         111 LV SV Index:   43 LVOT Area:  4.15 cm  RIGHT VENTRICLE              IVC RV S prime:     19.40 cm/s  IVC diam: 1.30 cm TAPSE (M-mode): 2.8 cm LEFT ATRIUM           Index        RIGHT ATRIUM           Index LA diam:      4.40 cm 1.72 cm/m   RA Area:     17.00 cm LA Vol (A2C): 67.0 ml 26.19 ml/m  RA Volume:   46.35 ml  18.13 ml/m LA Vol (A4C): 59.5 ml 23.28 ml/m  AORTIC VALVE AV Area (Vmax):    3.27 cm AV Area (Vmean):   3.22 cm AV Area (VTI):     3.39 cm AV Vmax:           163.00 cm/s AV Vmean:          109.000 cm/s AV VTI:            0.327 m AV Peak Grad:      10.6 mmHg AV Mean Grad:      6.0 mmHg LVOT Vmax:         128.33 cm/s LVOT Vmean:        84.600 cm/s LVOT VTI:          0.267 m LVOT/AV VTI ratio: 0.82  AORTA Ao Asc diam: 3.80 cm MITRAL VALVE MV Area (PHT): 3.85 cm    SHUNTS MV Decel Time: 197 msec    Systemic VTI:  0.27 m MV E velocity: 82.90 cm/s  Systemic Diam: 2.30 cm MV A velocity: 62.60 cm/s MV E/A ratio:  1.32 Mihai Croitoru MD Electronically signed by Sanda Klein MD Signature Date/Time: 07/14/2022/12:14:47 PM    Final    CARDIAC CATHETERIZATION  Result Date: 07/13/2022   Prox LAD to Mid LAD lesion is 80% stenosed.-Progression of disease (stenosis confirmed via OCT)   A drug-eluting stent was successfully placed using a SYNERGY XD 3.50X28. ->  Deployed to 3.7 mm.  Taper postdilation from 4.6-4.22-3.7 mm = full expansion. Post intervention, there is a 0% residual stenosis.   Mid RCA lesion is 45% stenosed.  RPDA lesion is 55% stenosed.   Previously placed Ost Cx to Prox Cx stent of unknown type is  widely patent.   The left ventricular systolic function is normal.   The left ventricular ejection fraction is 50-55% by visual estimate.   There is no aortic valve stenosis. POST-OPERATIVE DIAGNOSIS:  Culprit Lesion: Progression of mid LAD lesion just after diagonal branch at SP1 in progress from 70% to roughly 80% OCT guided PCI from major diagonal branch just beyond the next branch: Synergy XD 3.5 mm x 28 mm deployed to 3.7 mm and postdilated in tapered fashion  from 4.6-4.2-3.7 mm Widely patent LCx stent Distal RCA 45% and ostial PDA 55 % stenosis. LV gram relatively poor filling but EF does appear to be low normal 50 to 55%.  (Defer to echo) PLAN OF CARE:  Overnight monitoring, restart Xarelto at p.m. dosing tonight; would do 1 month of aspirin, Plavix and Xarelto and then discontinue aspirin.  Continue Plavix for 6 months total,then discontinue   DG Chest Port 1 View  Result Date: 07/12/2022 CLINICAL DATA:  Chest pain EXAM: PORTABLE CHEST 1 VIEW COMPARISON:  CXR 06/03/21 FINDINGS: No pleural effusion. No pneumothorax. No focal airspace opacity. No radiographically apparent displaced rib fractures. Visualized upper abdomen is  unremarkable. IMPRESSION: No focal airspace opacity. Electronically Signed   By: Marin Roberts M.D.   On: 07/12/2022 09:22   Disposition   Pt is being discharged home today in good condition.  Follow-up Plans & Appointments     Follow-up Information     [facility] Follow [facility].   Specialty: Cardiology Why: heart failure clinic scheduler will contact you to arrange a post hospital follow up, please give [place] a call if you do not hear from our scheduler in 3 bussiness days. Contact information: [id number] [place] [phone]               Discharge Instructions     Amb Referral to Cardiac Rehabilitation   Complete by: As directed    Diagnosis: Coronary Stents   After initial evaluation and assessments completed: Virtual Based Care may be provided alone or in conjunction with Phase 2 Cardiac Rehab based on patient barriers.: Yes   Intensive Cardiac Rehabilitation (ICR) [place] location only OR Traditional Cardiac Rehabilitation (TCR) *If criteria for ICR are not met will enroll in TCR [facility] only): Yes   Diet - low sodium heart healthy   Complete by: As directed    Discharge instructions   Complete by: As directed    No driving for  48 hours. No lifting over 5 lbs for 1 week. No sexual activity for 1 week.  Keep procedure site clean & dry. If you notice increased pain, swelling, bleeding or pus, call/return!  You may shower, but no soaking baths/hot tubs/pools for 1 week.   Increase activity slowly   Complete by: As directed         Discharge Medications   Allergies as of 07/14/2022       Reactions   Albuterol Anaphylaxis   Bee Venom Anaphylaxis   Metaproterenol Anaphylaxis   Other Anaphylaxis   DEET   Amoxicillin-pot Clavulanate Itching   OK with Amoxicillin   Cephalexin Other (See Comments)   Chest tightness   Amoxicillin Itching, Rash   Itching all over         Medication List     STOP taking these medications    hydrALAZINE 50 MG tablet Commonly known as: APRESOLINE       TAKE these medications    acetaminophen 500 MG tablet Commonly known as: TYLENOL Take 500 mg by mouth every 6 (six) hours as needed for moderate pain.   allopurinol 100 MG tablet Commonly known as: ZYLOPRIM Take 100 mg by mouth daily.   amiodarone 200 MG tablet Commonly known as: PACERONE Take 1 tablet (200 mg total) by mouth daily.   aspirin EC 81 MG tablet Take 1 tablet (81 mg total) by mouth daily. Swallow whole. Start taking on: July 15, 2022   buPROPion 300 MG 24 hr tablet Commonly known as: WELLBUTRIN XL Take 300 mg by mouth daily.   carvedilol 6.25 MG tablet Commonly known as: COREG TAKE 1 TABLET BY MOUTH 2 TIMES DAILY WITH A MEAL.   clonazePAM 0.5 MG tablet Commonly known as: KLONOPIN Take 0.5 mg by mouth 2 (two) times daily as needed for anxiety.   clopidogrel 75 MG tablet Commonly known as: PLAVIX Take 1 tablet (75 mg total) by mouth daily.   colchicine 0.6 MG tablet Take 0.6 mg by mouth 2 (two) times daily as needed (gout).   dapagliflozin propanediol 10 MG Tabs tablet Commonly known as: Farxiga Take 1 tablet (10 mg  total) by mouth daily.   doxazosin 1 MG tablet Commonly known as:  CARDURA TAKE 1 TABLET BY MOUTH AT BEDTIME.   Entresto 97-103 MG Generic drug: sacubitril-valsartan TAKE 1 TABLET BY MOUTH TWICE A DAY   fenofibrate 145 MG tablet Commonly known as: TRICOR Take 145 mg by mouth daily.   furosemide 20 MG tablet Commonly known as: LASIX TAKE 1 TABLET BY MOUTH AS NEEDED FOR FLUID OR EDEMA   isosorbide mononitrate 30 MG 24 hr tablet Commonly known as: IMDUR Take 1 tablet (30 mg total) by mouth daily. Start taking on: July 15, 2022   multivitamin with minerals Tabs tablet Take 1 tablet by mouth daily.   nitroGLYCERIN 0.4 MG SL tablet Commonly known as: Nitrostat Place 1 tablet (0.4 mg total) under the tongue every 5 (five) minutes as needed for chest pain.   potassium chloride SA 20 MEQ tablet Commonly known as: KLOR-CON M Take 1 tablet (20 mEq total) by mouth as needed (with Lasix).   rosuvastatin 40 MG tablet Commonly known as: Crestor Take 1 tablet (40 mg total) by mouth daily.   spironolactone 25 MG tablet Commonly known as: ALDACTONE TAKE 1 TABLET (25 MG TOTAL) BY MOUTH DAILY.   traMADol 50 MG tablet Commonly known as: ULTRAM Take 50 mg by mouth 3 (three) times daily as needed for moderate pain.   Wegovy 1 MG/0.5ML Soaj Generic drug: Semaglutide-Weight Management Inject 1 mg into the skin once a week.   Xarelto 20 MG Tabs tablet Generic drug: rivaroxaban TAKE 1 TABLET BY MOUTH EVERY DAY           Outstanding Labs/Studies   N/A  Duration of Discharge Encounter   Greater than 30 minutes including physician time.   Personally seen and examined. Agree with APP above with the following comments: S/p LAD PCI symptoms have resolved. Adding Imdur for additional afterload reduction. Adding PPI given triple therapy. Planned for triple therapy one months, Dual therapy six months then DOAC.  Rudean Haskell, MD [address] [place], [place] [address] [phone]  1:49 PM

## 2022-07-13 NOTE — Progress Notes (Addendum)
Rounding Note    Patient Name: Alexander [name] Date of Encounter: 07/13/2022  [place] Cardiologist: Glori Bickers, MD   Subjective   Patient reports that he is feeling much better today. Denies chest pain, palpitations, shortness of breath.   Inpatient Medications    Scheduled Meds:  amiodarone  200 mg Oral Daily   aspirin EC  81 mg Oral Daily   buPROPion  300 mg Oral Daily   carvedilol  6.25 mg Oral BID WC   dapagliflozin propanediol  10 mg Oral Daily   doxazosin  1 mg Oral QHS   fenofibrate  160 mg Oral Daily   isosorbide mononitrate  30 mg Oral Daily   rosuvastatin  40 mg Oral Daily   sodium chloride flush  3 mL Intravenous Q12H   spironolactone  25 mg Oral Daily   Continuous Infusions:  sodium chloride     sodium chloride 1 mL/kg/hr (07/13/22 [id number])   heparin 1,800 Units/hr (07/13/22 0614)   PRN Meds: sodium chloride, acetaminophen, nitroGLYCERIN, nitroGLYCERIN, ondansetron (ZOFRAN) IV, sodium chloride flush   Vital Signs    Vitals:   07/12/22 1525 07/12/22 2025 07/12/22 2328 07/13/22 0359  BP: (!) 142/97 (!) 101/56 119/76 113/79  Pulse: 68 72 63 67  Resp:  '18 18 19  '$ Temp:  98.7 F (37.1 C) 98.3 F (36.8 C) 98.2 F (36.8 C)  TempSrc:  Oral Oral Oral  SpO2: 94% 95% 93% 96%  Weight:      Height:        Intake/Output Summary (Last 24 hours) at 07/13/2022 0807 Last data filed at 07/13/2022 [id number] Gross per 24 hour  Intake 898.28 ml  Output --  Net 898.28 ml      07/12/2022    3:17 PM 07/12/2022   12:23 PM 03/22/2022    9:12 AM  Last 3 Weights  Weight (lbs) 300 lb 300 lb 296 lb  Weight (kg) 136.079 kg 136.079 kg 134.265 kg      Telemetry    Sinus rhythm - Personally Reviewed  ECG    Sinus rhythm, no acute ischemic changes - Personally Reviewed  Physical Exam   GEN: No acute distress.   Neck: No JVD Cardiac: RRR, no murmurs, rubs, or gallops.  Respiratory: Clear to auscultation bilaterally. GI: Soft, nontender, non-distended   MS: No edema; No deformity. Neuro:  Nonfocal  Psych: Normal affect   Labs    High Sensitivity Troponin:   Recent Labs  Lab 07/12/22 0854 07/12/22 1100  TROPONINIHS 10 11     Chemistry Recent Labs  Lab 07/12/22 0854 07/13/22 0238  NA 138 140  K 4.8 3.6  CL 106 104  CO2 23 27  GLUCOSE 109* 114*  BUN 20 18  CREATININE 1.11 1.35*  CALCIUM 10.0 9.1  GFRNONAA >60 >60  ANIONGAP 9 9    Lipids  Recent Labs  Lab 07/13/22 0238  CHOL 87  TRIG 237*  HDL 28*  LDLCALC 12  CHOLHDL 3.1    Hematology Recent Labs  Lab 07/12/22 0854 07/13/22 0238  WBC 8.1 8.8  RBC 5.76 5.03  HGB 17.5* 15.3  HCT 52.0 45.5  MCV 90.3 90.5  MCH 30.4 30.4  MCHC 33.7 33.6  RDW 13.0 13.0  PLT 205 218   Thyroid No results for input(s): "TSH", "FREET4" in the last 168 hours.  BNPNo results for input(s): "BNP", "PROBNP" in the last 168 hours.  DDimer No results for input(s): "DDIMER" in the last 168 hours.  Radiology    DG Chest Port 1 View  Result Date: 07/12/2022 CLINICAL DATA:  Chest pain EXAM: PORTABLE CHEST 1 VIEW COMPARISON:  CXR 06/03/21 FINDINGS: No pleural effusion. No pneumothorax. No focal airspace opacity. No radiographically apparent displaced rib fractures. Visualized upper abdomen is unremarkable. IMPRESSION: No focal airspace opacity. Electronically Signed   By: Marin Roberts M.D.   On: 07/12/2022 09:22    Cardiac Studies   Cath: 08/2020   Prox Cx lesion is 80% stenosed. Mid LAD lesion is 70% stenosed. A drug-eluting stent was successfully placed using a STENT RESOLUTE ONYX 4.0X26. Post intervention, there is a 0% residual stenosis.   1. Severe proximal Circumflex stenosis 2. Successful PTCA/DES x 1 proximal Circumflex 3. Pressure wire analysis of the mid LAD. RFR is 0.93 suggesting the mid stenosis is not flow limiting.    Recommendations: Will continue DAPT with ASA and Plavix for at least one month. His ASA could be stopped in one month since he will also be on  Xarelto. I would continue the Plavix for at least six months.    Diagnostic Dominance: Right  Intervention    Echo: 02/2021   IMPRESSIONS     1. Left ventricular ejection fraction, by estimation, is 55 to 60%. The  left ventricle has normal function. The left ventricle has no regional  wall motion abnormalities. The left ventricular internal cavity size was  mildly dilated. Left ventricular  diastolic parameters were normal.   2. Right ventricular systolic function is normal. The right ventricular  size is normal.   3. The mitral valve is normal in structure. Trivial mitral valve  regurgitation.   4. The aortic valve is normal in structure. Aortic valve regurgitation is  not visualized. No aortic stenosis is present.   5. Aortic dilatation noted. There is mild dilatation of the ascending  aorta, measuring 42 mm.   FINDINGS   Left Ventricle: Left ventricular ejection fraction, by estimation, is 55  to 60%. The left ventricle has normal function. The left ventricle has no  regional wall motion abnormalities. The left ventricular internal cavity  size was mildly dilated. There is   no left ventricular hypertrophy. Left ventricular diastolic parameters  were normal.   Right Ventricle: The right ventricular size is normal. Right vetricular  wall thickness was not well visualized. Right ventricular systolic  function is normal.   Left Atrium: Left atrial size was normal in size.   Right Atrium: Right atrial size was normal in size.   Pericardium: There is no evidence of pericardial effusion.   Mitral Valve: The mitral valve is normal in structure. Trivial mitral  valve regurgitation.   Tricuspid Valve: The tricuspid valve is normal in structure. Tricuspid  valve regurgitation is trivial.   Aortic Valve: The aortic valve is normal in structure. Aortic valve  regurgitation is not visualized. No aortic stenosis is present.   Pulmonic Valve: The pulmonic valve was grossly  normal. Pulmonic valve  regurgitation is mild.   Aorta: Aortic dilatation noted. There is mild dilatation of the ascending  aorta, measuring 42 mm.   IAS/Shunts: The interatrial septum was not well visualized.   Patient Profile     Alexander [name] is a 47 y.o. male with hypertension, hyperlipidemia, anxiety, paroxysmal atrial fibrillation, HFrEF, CAD status post PCI/DES x 1 to circumflex, residual disease of 70% of the LAD treated medically who is being seen 07/12/2022 for the evaluation of chest pain.   Assessment & Plan    Unstable angina  CAD s/p of circumflex '22, LAD disease treated medically  Patient with onset of left sided chest discomfort yesterday prompting ED evaluation. He was transferred to [facility] given history of known residual LAD disease of 70% (negative via RFR cath April 2022). Troponin 10->11 and EKG without acute ischemic abnormalities.   LHC today to clarify status of coronary anatomy Continue heparin, ASA, Coreg 6.'25mg'$  BID, Imdur '30mg'$   Acute on chronic HFrEF  Patient with LVEF reported as low as 10-15% as of January 2022. However, repeat echo in October 2022 with recovered LVEF 55-60%. Repeat echocardiogram is pending.  Defer diuresis until LVEDP assessed in cath today GDMT: Coreg 6.'25mg'$  BID Farxiga Spironolactone Entresto held with pending LHC  Paroxysmal atrial fibrillation  Patient in sinus rhythm this admission. Continue Amiodarone '200mg'$  daily. Xarelto held with pending LHC, on heparin.  Hypertension  Patient initially hypertensive on presentation, now improved after having received home HF/hypertension meds. Continue to monitor while Delene Loll is held.   Hyperlipidemia  LDL at goal. Continue statin.  Lab Results  Component Value Date   LDLCALC 12 07/13/2022   OSA  Continue nightly CPAP use.   For questions or updates, please contact [place] Please consult [email or web address] for contact info under        Signed, Lily Kocher, PA-C   07/13/2022, 8:07 AM     Personally seen and examined. Agree with APP above with the following comments: Obstructive LAD disease with CP that has now resolves with intervention. Planned for tripe therapy. Resuming home Entresto tonight Can restart Xarelto tonight Echo Pending today. Like DC tomorrow.  Rudean Haskell, MD [address], [address] [place], [place] [address] [phone]  12:30 PM

## 2022-07-13 NOTE — Progress Notes (Signed)
ANTICOAGULATION CONSULT NOTE - Follow Up Consult  Pharmacy Consult for heparin Indication:  USAP and PAF  Labs: Recent Labs    07/12/22 0854 07/12/22 1100 07/12/22 1831 07/13/22 0238  HGB 17.5*  --   --  15.3  HCT 52.0  --   --  45.5  PLT 205  --   --  218  APTT  --   --  45* 63*  HEPARINUNFRC  --   --  >1.10* >1.10*  CREATININE 1.11  --   --  1.35*  TROPONINIHS 10 11  --   --     Assessment: 47yo male remains slightly subtherapeutic on heparin after rate change; no infusion issues or signs of bleeding per RN.  Goal of Therapy:  aPTT 66-102 seconds   Plan:  Will increase heparin infusion slightly to 1800 units/hr and check PTT in 6 hours.    Wynona Neat, PharmD, BCPS  07/13/2022,4:06 AM

## 2022-07-13 NOTE — Care Management (Signed)
  Transition of Care [facility]) Screening Note   Patient Details  Name: Alexander [name] Date of Birth: 1976-04-03   Transition of Care [facility]) CM/SW Contact:    Bethena Roys, RN Phone Number: 07/13/2022, 3:05 PM    Transition of Care Department [facility]) has reviewed the patient and no TOC needs have been identified at this time. We will continue to monitor patient advancement through interdisciplinary progression rounds. If new patient transition needs arise, please place a TOC consult.

## 2022-07-14 ENCOUNTER — Inpatient Hospital Stay (HOSPITAL_COMMUNITY): Payer: BC Managed Care – PPO

## 2022-07-14 DIAGNOSIS — R079 Chest pain, unspecified: Secondary | ICD-10-CM

## 2022-07-14 LAB — ECHOCARDIOGRAM COMPLETE
AR max vel: 3.27 cm2
AV Area VTI: 3.39 cm2
AV Area mean vel: 3.22 cm2
AV Mean grad: 6 mmHg
AV Peak grad: 10.6 mmHg
Ao pk vel: 1.63 m/s
Area-P 1/2: 3.85 cm2
Height: 73 in
S' Lateral: 3.6 cm
Weight: 4800 oz

## 2022-07-14 LAB — BASIC METABOLIC PANEL
Anion gap: 6 (ref 5–15)
BUN: 13 mg/dL (ref 6–20)
CO2: 25 mmol/L (ref 22–32)
Calcium: 9 mg/dL (ref 8.9–10.3)
Chloride: 106 mmol/L (ref 98–111)
Creatinine, Ser: 1.18 mg/dL (ref 0.61–1.24)
GFR, Estimated: >60 mL/min (ref >60)
Glucose, Bld: 110 mg/dL — ABNORMAL HIGH (ref 70–99)
Potassium: 4 mmol/L (ref 3.5–5.1)
Sodium: 137 mmol/L (ref 135–145)

## 2022-07-14 LAB — CBC
HCT: 46.4 % (ref 39.0–52.0)
Hemoglobin: 15.3 g/dL (ref 13.0–17.0)
MCH: 29.8 pg (ref 26.0–34.0)
MCHC: 33 g/dL (ref 30.0–36.0)
MCV: 90.3 fL (ref 80.0–100.0)
Platelets: 222 10*3/uL (ref 150–400)
RBC: 5.14 MIL/uL (ref 4.22–5.81)
RDW: 12.8 % (ref 11.5–15.5)
WBC: 6.6 10*3/uL (ref 4.0–10.5)
nRBC: 0 % (ref 0.0–0.2)

## 2022-07-14 LAB — HEMOGLOBIN A1C
Hgb A1c MFr Bld: 5.6 % (ref 4.8–5.6)
Mean Plasma Glucose: 114 mg/dL

## 2022-07-14 MED ORDER — NITROGLYCERIN 0.4 MG SL SUBL: 0.4000 mg | SUBLINGUAL_TABLET | SUBLINGUAL | 3 refills | Status: AC | PRN

## 2022-07-14 MED ORDER — CLOPIDOGREL BISULFATE 75 MG PO TABS: 75.0000 mg | ORAL_TABLET | Freq: Every day | ORAL | 3 refills | Status: DC

## 2022-07-14 MED ORDER — ASPIRIN 81 MG PO TBEC: 81.0000 mg | DELAYED_RELEASE_TABLET | Freq: Every day | ORAL | Status: DC

## 2022-07-14 MED ORDER — ISOSORBIDE MONONITRATE ER 30 MG PO TB24: 30.0000 mg | ORAL_TABLET | Freq: Every day | ORAL | 1 refills | Status: DC

## 2022-07-14 NOTE — Progress Notes (Signed)
Rounding Note    Patient Name: Alexander [name] Date of Encounter: 07/14/2022  [place] Cardiologist: Glori Bickers, MD   Subjective   Denies any CP or SOB. Getting echocardiogram during interview.   Inpatient Medications    Scheduled Meds:  amiodarone  200 mg Oral Daily   aspirin EC  81 mg Oral Daily   buPROPion  300 mg Oral Daily   carvedilol  6.25 mg Oral BID WC   clopidogrel  75 mg Oral Q breakfast   dapagliflozin propanediol  10 mg Oral Daily   doxazosin  1 mg Oral QHS   fenofibrate  160 mg Oral Daily   isosorbide mononitrate  30 mg Oral Daily   rivaroxaban  20 mg Oral Daily   rosuvastatin  40 mg Oral Daily   sacubitril-valsartan  1 tablet Oral BID   sodium chloride flush  3 mL Intravenous Q12H   sodium chloride flush  3 mL Intravenous Q12H   spironolactone  25 mg Oral Daily   Continuous Infusions:  sodium chloride     PRN Meds: sodium chloride, acetaminophen, nitroGLYCERIN, nitroGLYCERIN, ondansetron (ZOFRAN) IV, sodium chloride flush   Vital Signs    Vitals:   07/13/22 1720 07/13/22 2053 07/13/22 2300 07/14/22 0434  BP: 116/73 130/87 123/81 109/75  Pulse: 67 (!) 59 60 70  Resp:  '18 16 18  '$ Temp:  97.7 F (36.5 C) 97.9 F (36.6 C) 97.8 F (36.6 C)  TempSrc:  Oral Axillary Axillary  SpO2:  95% 97% 97%  Weight:      Height:        Intake/Output Summary (Last 24 hours) at 07/14/2022 0821 Last data filed at 07/13/2022 1553 Gross per 24 hour  Intake 0 ml  Output --  Net 0 ml      07/12/2022    3:17 PM 07/12/2022   12:23 PM 03/22/2022    9:12 AM  Last 3 Weights  Weight (lbs) 300 lb 300 lb 296 lb  Weight (kg) 136.079 kg 136.079 kg 134.265 kg      Telemetry    NSR without significant ventricular ectopy - Personally Reviewed  ECG    NSR with q wave in the inferior leads - Personally Reviewed  Physical Exam   GEN: No acute distress.   Neck: No JVD Cardiac: RRR, no murmurs, rubs, or gallops.  Respiratory: Clear to auscultation  bilaterally. GI: Soft, nontender, non-distended  MS: No edema; No deformity. Neuro:  Nonfocal  Psych: Normal affect   Labs    High Sensitivity Troponin:   Recent Labs  Lab 07/12/22 0854 07/12/22 1100  TROPONINIHS 10 11     Chemistry Recent Labs  Lab 07/12/22 0854 07/13/22 0238 07/14/22 0704  NA 138 140 137  K 4.8 3.6 4.0  CL 106 104 106  CO2 '23 27 25  '$ GLUCOSE 109* 114* 110*  BUN '20 18 13  '$ CREATININE 1.11 1.35* 1.18  CALCIUM 10.0 9.1 9.0  GFRNONAA >60 >60 >60  ANIONGAP '9 9 6    '$ Lipids  Recent Labs  Lab 07/13/22 0238  CHOL 87  TRIG 237*  HDL 28*  LDLCALC 12  CHOLHDL 3.1    Hematology Recent Labs  Lab 07/12/22 0854 07/13/22 0238 07/14/22 0704  WBC 8.1 8.8 6.6  RBC 5.76 5.03 5.14  HGB 17.5* 15.3 15.3  HCT 52.0 45.5 46.4  MCV 90.3 90.5 90.3  MCH 30.4 30.4 29.8  MCHC 33.7 33.6 33.0  RDW 13.0 13.0 12.8  PLT 205 218 222  Thyroid No results for input(s): "TSH", "FREET4" in the last 168 hours.  BNPNo results for input(s): "BNP", "PROBNP" in the last 168 hours.  DDimer No results for input(s): "DDIMER" in the last 168 hours.   Radiology    CARDIAC CATHETERIZATION  Result Date: 07/13/2022   Prox LAD to Mid LAD lesion is 80% stenosed.-Progression of disease (stenosis confirmed via OCT)   A drug-eluting stent was successfully placed using a SYNERGY XD 3.50X28. ->  Deployed to 3.7 mm.  Taper postdilation from 4.6-4.22-3.7 mm = full expansion. Post intervention, there is a 0% residual stenosis.   Mid RCA lesion is 45% stenosed.  RPDA lesion is 55% stenosed.   Previously placed Ost Cx to Prox Cx stent of unknown type is  widely patent.   The left ventricular systolic function is normal.   The left ventricular ejection fraction is 50-55% by visual estimate.   There is no aortic valve stenosis. POST-OPERATIVE DIAGNOSIS:  Culprit Lesion: Progression of mid LAD lesion just after diagonal branch at SP1 in progress from 70% to roughly 80% OCT guided PCI from major diagonal  branch just beyond the next branch: Synergy XD 3.5 mm x 28 mm deployed to 3.7 mm and postdilated in tapered fashion from 4.6-4.2-3.7 mm Widely patent LCx stent Distal RCA 45% and ostial PDA 55 % stenosis. LV gram relatively poor filling but EF does appear to be low normal 50 to 55%.  (Defer to echo) PLAN OF CARE:  Overnight monitoring, restart Xarelto at p.m. dosing tonight; would do 1 month of aspirin, Plavix and Xarelto and then discontinue aspirin.  Continue Plavix for 6 months total,then discontinue   DG Chest Port 1 View  Result Date: 07/12/2022 CLINICAL DATA:  Chest pain EXAM: PORTABLE CHEST 1 VIEW COMPARISON:  CXR 06/03/21 FINDINGS: No pleural effusion. No pneumothorax. No focal airspace opacity. No radiographically apparent displaced rib fractures. Visualized upper abdomen is unremarkable. IMPRESSION: No focal airspace opacity. Electronically Signed   By: Marin Roberts M.D.   On: 07/12/2022 09:22    Cardiac Studies   Cath 07/13/2022   Prox LAD to Mid LAD lesion is 80% stenosed.-Progression of disease (stenosis confirmed via OCT)   A drug-eluting stent was successfully placed using a SYNERGY XD 3.50X28. ->  Deployed to 3.7 mm.  Taper postdilation from 4.6-4.22-3.7 mm = full expansion. Post intervention, there is a 0% residual stenosis.   Mid RCA lesion is 45% stenosed.  RPDA lesion is 55% stenosed.   Previously placed Ost Cx to Prox Cx stent of unknown type is  widely patent.   The left ventricular systolic function is normal.   The left ventricular ejection fraction is 50-55% by visual estimate.   There is no aortic valve stenosis.   POST-OPERATIVE DIAGNOSIS:   Culprit Lesion: Progression of mid LAD lesion just after diagonal branch at SP1 in progress from 70% to roughly 80% OCT guided PCI from major diagonal branch just beyond the next branch: Synergy XD 3.5 mm x 28 mm deployed to 3.7 mm and postdilated in tapered fashion from 4.6-4.2-3.7 mm Widely patent LCx stent Distal RCA 45% and ostial  PDA 55 % stenosis. LV gram relatively poor filling but EF does appear to be low normal 50 to 55%.  (Defer to echo)     PLAN OF CARE:  Overnight monitoring, restart Xarelto at p.m. dosing tonight; would do 1 month of aspirin, Plavix and Xarelto and then discontinue aspirin.  Continue Plavix for 6 months total,then discontinue   Patient  Profile     47 y.o. male with PMH of HTN, HLD, PAF, HFrEF and CAD who presented with chest pain  Assessment & Plan    Unstable angina - cath 07/13/2022 showed 80% prox to mid LAD treated with Synergy XD 3.5x28 mm DES, 45% mid RCA, 55% PRDA, EF 50-55%, patent LCx stent. Plan to do triple therapy with ASA, Plavix and Xarelto for 1 month, then D/C ASA at 1 month, and D/C plavix at 6 month.  - stable for D/C today if echo come back ok, will defer to MD to see if Imdur can be discharged.   CAD: ASA, plavix and Xarelto for 1 month, then D/C ASA, plan to D/C plavix at 6 month  HTN: BP stable after reinitiate entresto. Also on farxiga, coreg and spironolactone.   HLD: on crestor  PAF: on Xarelto and amiodarone  HFrEF: EF 10-15% in 05/2020, improved to 55-60% by 02/2021. On Coreg, farxiga and spironolactone. Entresto held, will resume  For questions or updates, please contact [place] Please consult [email or web address] for contact info under        Signed, Almyra Deforest, [place]  07/14/2022, 8:21 AM

## 2022-07-14 NOTE — Progress Notes (Signed)
Echocardiogram 2D Echocardiogram has been performed.  Alexander [name] 07/14/2022, 9:24 AM

## 2022-07-14 NOTE — Progress Notes (Signed)
CARDIAC REHAB PHASE I   PRE:  Rate/Rhythm: 65 1st AVB   BP:  Sitting: 132/78      SaO2: 98 RA  MODE:  Ambulation: 470 ft   POST:  Rate/Rhythm: 67 1st AVB  BP:  Sitting: 152/99      SaO2: 97 RA   Pt ambulated in hall independently, tolerating well with no CP or SOB. Returned to chair with call bell and bedside table in reach. Post stent education including site care, restrictions, risk factors, exercise guidelines, antiplatelet therapy importance, heart healthy diet and CRP2. All questions and concerns addressed. Will refer to [facility] for CRP2. Plan for home later today.   [phone]  Vanessa Barbara, RN BSN 07/14/2022 10:30 AM

## 2022-07-15 LAB — LIPOPROTEIN A (LPA): Lipoprotein (a): 9.5 nmol/L (ref <75.0)

## 2022-07-18 MED FILL — Nitroglycerin IV Soln 100 MCG/ML in D5W: INTRA_ARTERIAL | Qty: 10 | Status: AC

## 2022-07-20 ENCOUNTER — Other Ambulatory Visit (HOSPITAL_COMMUNITY): Payer: Self-pay | Admitting: Internal Medicine

## 2022-07-28 ENCOUNTER — Other Ambulatory Visit (HOSPITAL_COMMUNITY): Payer: Self-pay | Admitting: Internal Medicine

## 2022-07-29 NOTE — Progress Notes (Signed)
Advanced Heart Failure Clinic Note   PCP: Molli Barrows, MD [facility]: Dr. Haroldine Laws  EP: Dr. Quentin Ore   Reason for Visit: F/u for chronic systolic heart failure, PAF and CAD s/p PCI   HPI: Alexander [name] is a 47 y.o. year old with systolic heart failure (presumed tachy-mediated), obesity, HTN, hyperlipidemia, adjustment disorder, anxiety, and A fib.  Presented to [facility] on 04/24/20 with palpitations and SOB in setting of heavy ETOH use EKG showed A Fib RVR. Started on diltiazem with plans for admit but he left AMA. Discharged on diltiazem + Xarelto.    Presented to [facility] ED on 04/25/20 with HF symptoms. EKG showed A Fib RVR. Admitted and started on diltiazem. Echo showed EF 35-40%. EP consulted. Started on metoprolol and losartan with plans to reassess in 3 weeks for possible cardioversion.    Sent back to [facility] ED from EP clinic on 05/13/20 with N/V and A fib RVR. On arrival he was cool and volume overloaed.  Started on amio drip + milrinone due to cardiogenic shock. Echo EF 10-15%. Diuresed with IV lasix and later stopped with addition of GDMT. After amio load had successful cardioversion.   Echo 3/22 EF 45-50% RV ok. R/LHC on 08/17/20. Found to have  pLAD lesion of 70% and 80% ostial Lcx. Dr. Angelena Form preformed PCI/DESx1 to pLcx. Pressure wire analysis of the mLAD noting RFR is 0.93 suggesting LAD stenosis is not flow limiting. 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 LVEDP only 4.   Echo 10/22: EF 55-60%. Lost to follow up.   Admitted 3/24 with CP. Cath showed 80% prox to mid LAD treated with Synergy XD 3.5x28 mm DES, 45% mid RCA, 55% PRDA, patent LCx stent. Plan Xarelto, Plavix and ASA x 1 month, then drop ASA; continue Plavix x 6 months then stop. Echo showed EF 60-65%, RV ok. GDMT titrated and discharged home, weight 300 lbs.  Today he returns for post hospital HF follow up. Last seen in AHF clinic 02/2021. Overall feeling fine. He is not SOB with activity, recently went on  vacation and had no issues, able to snorkle and sight-see with no dyspnea. Denies palpitations, CP, dizziness, edema, or PND/Orthopnea. Appetite ok. No fever or chills. Weight at home 303 pounds. Taking all medications. Wears CPAP. Planning on doing cardiac rehab when insurance approves. Took lasix 40 mg last week and felt poorly afterwards. Has 2 teenaged boys, recently engaged and will have 2 more teenage boys in the home.   Cardiac Studies - Echo (3/24): EF 60-65%, RV ok  - LHC (3/24): 80% mLAD, s/p DES, RCA 45%, RPDA lesion 55%  - Echo (10/22): EF 55-60%  - LHC (4/22): Prox Cx lesion is 80% stenosed. Mid LAD lesion is 70% stenosed. A drug-eluting stent was successfully placed using a STENT RESOLUTE ONYX 4.0X26. Post intervention, there is a 0% residual stenosis.   1. Severe proximal Circumflex stenosis 2. Successful PTCA/DES x 1 proximal Circumflex 3. Pressure wire analysis of the mid LAD. RFR is 0.93 suggesting the mid stenosis is not flow limiting.  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 LVEDP only 4.   - Echo (3/22): LVEF 45-50%, RV ok   - Echo (12/21): LVEF 35-40%, RV ok   ROS: All systems reviewed and negative except as per HPI.   Past Medical History:  Diagnosis Date   Arrhythmia    Atrial fibrillation ([place])    CAD (coronary artery disease) 08/17/2020   PCI +  DES to pLCx, 70% mLAD negative FFR testing (medical therapy)    CHF (congestive heart failure) (HCC)    Chronic systolic heart failure (HCC)    Gout    Hyperlipidemia    Hypertension    OSA (obstructive sleep apnea) 09/2020    Current Outpatient Medications  Medication Sig Dispense Refill   acetaminophen (TYLENOL) 500 MG tablet Take 500 mg by mouth every 6 (six) hours as needed for moderate pain.     allopurinol (ZYLOPRIM) 100 MG tablet Take 100 mg by mouth daily.     amiodarone (PACERONE) 200 MG tablet Take 1 tablet (200 mg total) by mouth daily. 90 tablet 3   aspirin EC 81 MG tablet Take 1 tablet  (81 mg total) by mouth daily. Swallow whole.     buPROPion (WELLBUTRIN XL) 300 MG 24 hr tablet Take 300 mg by mouth daily.     carvedilol (COREG) 6.25 MG tablet TAKE 1 TABLET BY MOUTH 2 TIMES DAILY WITH A MEAL. 180 tablet 3   clonazePAM (KLONOPIN) 0.5 MG tablet Take 0.5 mg by mouth 2 (two) times daily as needed for anxiety.     clopidogrel (PLAVIX) 75 MG tablet Take 1 tablet (75 mg total) by mouth daily. 90 tablet 3   colchicine 0.6 MG tablet Take 0.6 mg by mouth 2 (two) times daily as needed (gout).     dapagliflozin propanediol (FARXIGA) 10 MG TABS tablet Take 1 tablet (10 mg total) by mouth daily. 90 tablet 3   doxazosin (CARDURA) 1 MG tablet TAKE 1 TABLET BY MOUTH EVERYDAY AT BEDTIME 71 tablet 0   fenofibrate (TRICOR) 145 MG tablet Take 145 mg by mouth daily.     furosemide (LASIX) 20 MG tablet TAKE 1 TABLET BY MOUTH AS NEEDED FOR FLUID OR EDEMA 30 tablet 5   isosorbide mononitrate (IMDUR) 30 MG 24 hr tablet Take 1 tablet (30 mg total) by mouth daily. 90 tablet 1   Multiple Vitamin (MULTIVITAMIN WITH MINERALS) TABS tablet Take 1 tablet by mouth daily.     nitroGLYCERIN (NITROSTAT) 0.4 MG SL tablet Place 1 tablet (0.4 mg total) under the tongue every 5 (five) minutes as needed for chest pain. 25 tablet 3   potassium chloride SA (KLOR-CON) 20 MEQ tablet Take 1 tablet (20 mEq total) by mouth as needed (with Lasix). 90 tablet 3   rosuvastatin (CRESTOR) 40 MG tablet TAKE 1 TABLET BY MOUTH EVERY DAY 30 tablet 0   sacubitril-valsartan (ENTRESTO) 97-103 MG TAKE 1 TABLET BY MOUTH TWICE A DAY 60 tablet 1   Semaglutide-Weight Management (WEGOVY) 1 MG/0.5ML SOAJ Inject 1 mg into the skin once a week. (Patient taking differently: Inject 1.7 mg into the skin once a week.) 2 mL 11   spironolactone (ALDACTONE) 25 MG tablet TAKE 1 TABLET (25 MG TOTAL) BY MOUTH DAILY. 30 tablet 6   traMADol (ULTRAM) 50 MG tablet Take 50 mg by mouth 3 (three) times daily as needed for moderate pain.     XARELTO 20 MG TABS tablet  TAKE 1 TABLET BY MOUTH EVERY DAY 30 tablet 5   No current facility-administered medications for this encounter.    Allergies  Allergen Reactions   Albuterol Anaphylaxis   Bee Venom Anaphylaxis   Metaproterenol Anaphylaxis   Other Anaphylaxis    DEET   Amoxicillin-Pot Clavulanate Itching    OK with Amoxicillin   Cephalexin Other (See Comments)    Chest tightness   Amoxicillin Itching and Rash    Itching all over  Social History   Socioeconomic History   Marital status: Divorced    Spouse name: Not on file   Number of children: 2   Years of education: 16   Highest education level: Bachelor's degree (e.g., BA, AB, BS)  Occupational History   Not on file  Tobacco Use   Smoking status: Never   Smokeless tobacco: Never  Substance and Sexual Activity   Alcohol use: Yes    Comment: occ   Drug use: Not Currently   Sexual activity: Not on file  Other Topics Concern   Not on file  Social History Narrative   Not on file   Social Determinants of Health   Financial Resource Strain: Not on file  Food Insecurity: No Food Insecurity (07/12/2022)   Hunger Vital Sign    Worried About Running Out of Food in the Last Year: Never true    Ran Out of Food in the Last Year: Never true  Transportation Needs: No Transportation Needs (07/12/2022)   PRAPARE - Hydrologist (Medical): No    Lack of Transportation (Non-Medical): No  Physical Activity: Not on file  Stress: Not on file  Social Connections: Not on file  Intimate Partner Violence: Not At Risk (07/12/2022)   Humiliation, Afraid, Rape, and Kick questionnaire    Fear of Current or Ex-Partner: No    Emotionally Abused: No    Physically Abused: No    Sexually Abused: No   Family History  Problem Relation Age of Onset   COPD Mother    BP 120/84   Pulse 66   Wt (!) 138.3 kg (305 lb)   SpO2 99%   BMI 40.24 kg/m   Wt Readings from Last 3 Encounters:  08/05/22 (!) 138.3 kg (305 lb)  07/12/22  136.1 kg (300 lb)  03/22/22 134.3 kg (296 lb)   PHYSICAL EXAM: General:  NAD. No resp difficulty, walked into clinic HEENT: Normal Neck: Supple. No JVD. Carotids 2+ bilat; no bruits. No lymphadenopathy or thryomegaly appreciated. Cor: PMI nondisplaced. Regular rate & rhythm. No rubs, gallops or murmurs. Lungs: Clear Abdomen: Soft, obese, nontender, nondistended. No hepatosplenomegaly. No bruits or masses. Good bowel sounds. Extremities: No cyanosis, clubbing, rash, edema Neuro: Alert & oriented x 3, cranial nerves grossly intact. Moves all 4 extremities w/o difficulty. Affect pleasant.  ECG (personally reviewed): SB 58 bpm  ASSESSMENT & PLAN: 1. Chronic Systolic Heart Failure - Echo (12/21): EF 35-40%, RV mildly reduced  - Admit (1/22) for a/c CHF>>cardiogenic shock in the setting of rapid afib  - Echo (1/22): EF <20%, RV mildly reduced - Echo (3/22): EF 45-50% RV ok.  - Echo (10/22): EF 55-60% RV normal  - Suspect primarily tachy mediated  - Echo (3/24): EF 60-65%, RV ok - NYHA I, volume OK - Continue Lasix 20 mg PRN.  - Continue Entresto 97-103 mg bid. - Continue Farxiga 10 mg daily.  - Continue spiro 25 mg daily.  - Continue carvedilol 6.25 mg bid. Will not increase with HR 58 today. - Labs today.  2. CAD:  - LHC 08/17/20 70% mid LAD, non flow limiting by FFR anyalsis, 80% pLCx treated w/ PCI + DES. Medical management for LAD stenosis.  - LHC (3/24) s/p DES mLAD. - No CP. - Continue Imdur 30 mg daily. - Continue Plavix + Xarelto + ASA, can drop ASA x 1 month; then drop Plavix after 6 months. - Continue ? blocker + statin. - CR  3. PAF - Maintaining  NSR post DCCV. - Decrease amiodarone to 100 mg daily. Plan stopping soon if no recurrence  - Continue Xarelto. - Avoid ETOH and get weight down - Continue CPAP - Check amio labs and CBC.  4. Obesity Body mass index is 40.24 kg/m. - He has met with a nutritionist and is working on weight loss - On GLP1RA  5. OSA -  Continue CPAP  6. HTN - BP controlled. - Continue doxazosin 1 mg qhs - Continue weight loss efforts  7. Gout - Followed by Dr. Amil Amen ([facility]).  Follow up in 6 months with Dr. Haroldine Laws. Needs referral to [facility], post PCI (Dr. Ellyn Hack).  [place], FNP 08/05/22

## 2022-08-02 ENCOUNTER — Other Ambulatory Visit (HOSPITAL_COMMUNITY): Payer: Self-pay | Admitting: Cardiology

## 2022-08-04 DIAGNOSIS — G4733 Obstructive sleep apnea (adult) (pediatric): Secondary | ICD-10-CM | POA: Diagnosis not present

## 2022-08-05 ENCOUNTER — Ambulatory Visit (HOSPITAL_COMMUNITY)
Admission: RE | Admit: 2022-08-05 | Discharge: 2022-08-05 | Disposition: A | Discharge status: Home / Self Care | Payer: BC Managed Care – PPO | Source: Ambulatory Visit | Attending: Family Medicine | Admitting: Family Medicine

## 2022-08-05 ENCOUNTER — Encounter (HOSPITAL_COMMUNITY): Payer: Self-pay

## 2022-08-05 VITALS — BP 120/84 | HR 66 | Wt 305.0 lb

## 2022-08-05 DIAGNOSIS — I110 Hypertensive heart disease with heart failure: Secondary | ICD-10-CM | POA: Insufficient documentation

## 2022-08-05 DIAGNOSIS — Z79899 Other long term (current) drug therapy: Secondary | ICD-10-CM | POA: Insufficient documentation

## 2022-08-05 DIAGNOSIS — I5022 Chronic systolic (congestive) heart failure: Secondary | ICD-10-CM

## 2022-08-05 DIAGNOSIS — Z955 Presence of coronary angioplasty implant and graft: Secondary | ICD-10-CM | POA: Diagnosis not present

## 2022-08-05 DIAGNOSIS — E785 Hyperlipidemia, unspecified: Secondary | ICD-10-CM | POA: Insufficient documentation

## 2022-08-05 DIAGNOSIS — Z7902 Long term (current) use of antithrombotics/antiplatelets: Secondary | ICD-10-CM | POA: Diagnosis not present

## 2022-08-05 DIAGNOSIS — Z6841 Body Mass Index (BMI) 40.0 and over, adult: Secondary | ICD-10-CM | POA: Insufficient documentation

## 2022-08-05 DIAGNOSIS — I25110 Atherosclerotic heart disease of native coronary artery with unstable angina pectoris: Secondary | ICD-10-CM | POA: Diagnosis not present

## 2022-08-05 DIAGNOSIS — G4733 Obstructive sleep apnea (adult) (pediatric): Secondary | ICD-10-CM

## 2022-08-05 DIAGNOSIS — M109 Gout, unspecified: Secondary | ICD-10-CM | POA: Diagnosis not present

## 2022-08-05 DIAGNOSIS — I480 Paroxysmal atrial fibrillation: Secondary | ICD-10-CM

## 2022-08-05 DIAGNOSIS — I10 Essential (primary) hypertension: Secondary | ICD-10-CM

## 2022-08-05 DIAGNOSIS — I2510 Atherosclerotic heart disease of native coronary artery without angina pectoris: Secondary | ICD-10-CM | POA: Insufficient documentation

## 2022-08-05 DIAGNOSIS — E669 Obesity, unspecified: Secondary | ICD-10-CM | POA: Insufficient documentation

## 2022-08-05 DIAGNOSIS — Z7901 Long term (current) use of anticoagulants: Secondary | ICD-10-CM | POA: Insufficient documentation

## 2022-08-05 DIAGNOSIS — Z7982 Long term (current) use of aspirin: Secondary | ICD-10-CM | POA: Diagnosis not present

## 2022-08-05 LAB — CBC
HCT: 49.8 % (ref 39.0–52.0)
Hemoglobin: 17 g/dL (ref 13.0–17.0)
MCH: 30.3 pg (ref 26.0–34.0)
MCHC: 34.1 g/dL (ref 30.0–36.0)
MCV: 88.8 fL (ref 80.0–100.0)
Platelets: 243 10*3/uL (ref 150–400)
RBC: 5.61 MIL/uL (ref 4.22–5.81)
RDW: 12.8 % (ref 11.5–15.5)
WBC: 9.8 10*3/uL (ref 4.0–10.5)
nRBC: 0 % (ref 0.0–0.2)

## 2022-08-05 LAB — BRAIN NATRIURETIC PEPTIDE: B Natriuretic Peptide: 34 pg/mL (ref 0.0–100.0)

## 2022-08-05 LAB — COMPREHENSIVE METABOLIC PANEL
ALT: 41 U/L (ref 0–44)
AST: 36 U/L (ref 15–41)
Albumin: 4.6 g/dL (ref 3.5–5.0)
Alkaline Phosphatase: 42 U/L (ref 38–126)
Anion gap: 13 (ref 5–15)
BUN: 13 mg/dL (ref 6–20)
CO2: 27 mmol/L (ref 22–32)
Calcium: 9.5 mg/dL (ref 8.9–10.3)
Chloride: 99 mmol/L (ref 98–111)
Creatinine, Ser: 1.17 mg/dL (ref 0.61–1.24)
GFR, Estimated: >60 mL/min (ref >60)
Glucose, Bld: 103 mg/dL — ABNORMAL HIGH (ref 70–99)
Potassium: 4.3 mmol/L (ref 3.5–5.1)
Sodium: 139 mmol/L (ref 135–145)
Total Bilirubin: 0.7 mg/dL (ref 0.3–1.2)
Total Protein: 7.1 g/dL (ref 6.5–8.1)

## 2022-08-05 LAB — TSH: TSH: 2.322 u[IU]/mL (ref 0.350–4.500)

## 2022-08-05 MED ORDER — AMIODARONE HCL 200 MG PO TABS: 100.0000 mg | ORAL_TABLET | Freq: Every day | ORAL | 3 refills | Status: DC

## 2022-08-05 NOTE — Patient Instructions (Addendum)
Thank you for coming in today  EKG was done today  Labs were done today, if any labs are abnormal the clinic will call you No news is good news  You have been referred to general cardiology, their office will call you for further appointment details    Medications: Decrease Amiodarone to 100 mg 1/2 tablet daily   Follow up appointments:  Your physician recommends that you schedule a follow-up appointment in:  6 months with Dr. Haroldine Laws  You will receive a reminder letter in the mail a few months in advance. If you don't receive a letter, please call our office to schedule the follow-up appointment.    Do the following things EVERYDAY: Weigh yourself in the morning before breakfast. Write it down and keep it in a log. Take your medicines as prescribed Eat low salt foods--Limit salt (sodium) to 2000 mg per day.  Stay as active as you can everyday Limit all fluids for the day to less than 2 liters   At the [facility], you and your health needs are our priority. As part of our continuing mission to provide you with exceptional heart care, we have created designated Provider Care Teams. These Care Teams include your primary Cardiologist (physician) and Advanced Practice Providers (APPs- Physician Assistants and Nurse Practitioners) who all work together to provide you with the care you need, when you need it.   You may see any of the following providers on your designated Care Team at your next follow up: Dr Glori Bickers Dr Loralie Champagne Dr. Roxana Hires, NP Lyda Jester, [place] [facility] [place], [place] Forestine Na, NP Audry Riles, PharmD   Please be sure to bring in all your medications bottles to every appointment.    Thank you for choosing [facility]  If you have any questions or concerns before your next appointment please send [place] a message through [place] or call our office at  [phone].    TO LEAVE A MESSAGE FOR THE NURSE SELECT OPTION 2, PLEASE LEAVE A MESSAGE INCLUDING: YOUR NAME DATE OF BIRTH CALL BACK NUMBER REASON FOR CALL**this is important as we prioritize the call backs  YOU WILL RECEIVE A CALL BACK THE SAME DAY AS LONG AS YOU CALL BEFORE 4:00 PM

## 2022-08-08 ENCOUNTER — Other Ambulatory Visit (HOSPITAL_COMMUNITY): Payer: Self-pay | Admitting: Internal Medicine

## 2022-08-08 ENCOUNTER — Other Ambulatory Visit (HOSPITAL_COMMUNITY): Payer: Self-pay | Admitting: Cardiology

## 2022-08-09 ENCOUNTER — Encounter: Payer: Self-pay | Admitting: Internal Medicine

## 2022-08-12 ENCOUNTER — Encounter (HOSPITAL_COMMUNITY): Payer: Self-pay

## 2022-08-16 ENCOUNTER — Encounter (HOSPITAL_COMMUNITY)
Admission: RE | Admit: 2022-08-16 | Discharge: 2022-08-16 | Disposition: A | Discharge status: Home / Self Care | Payer: BC Managed Care – PPO | Source: Ambulatory Visit | Attending: Internal Medicine | Admitting: Internal Medicine

## 2022-08-16 ENCOUNTER — Encounter (HOSPITAL_COMMUNITY): Payer: Self-pay

## 2022-08-16 VITALS — BP 124/86 | HR 62 | Ht 73.5 in | Wt 312.2 lb

## 2022-08-16 DIAGNOSIS — I5042 Chronic combined systolic (congestive) and diastolic (congestive) heart failure: Secondary | ICD-10-CM | POA: Insufficient documentation

## 2022-08-16 DIAGNOSIS — I5041 Acute combined systolic (congestive) and diastolic (congestive) heart failure: Secondary | ICD-10-CM | POA: Insufficient documentation

## 2022-08-16 DIAGNOSIS — Z955 Presence of coronary angioplasty implant and graft: Secondary | ICD-10-CM | POA: Diagnosis not present

## 2022-08-16 NOTE — Progress Notes (Addendum)
Cardiac Rehab Medication Review by a Nurse   Does the patient  feel that his/her medications are working for him/her?  Yes.   Has the patient been experiencing any side effects to the medications prescribed?  Only to [facility]. The first day with nausea.   Does the patient measure his/her own blood pressure at home? Yes. Measures BP daily.   Does the patient have any problems obtaining medications due to transportation or finances?   No.   Understanding of regimen: excellent Understanding of indications: excellent Potential of compliance: excellent    Nurse comments: This is the second time at rehab for [place]. He verbalized understanding of the program. He stated he needs to work better on decreasing sodium intake.Velora Mediate, RN

## 2022-08-16 NOTE — Progress Notes (Signed)
Cardiac Individual Treatment Plan  Patient Details  Name: Alexander [name] MRN: [id number] Date of Birth: 28-Apr-1976 Referring Provider:   Flowsheet Row INTENSIVE CARDIAC REHAB ORIENT from 08/16/2022 in [facility]  Referring Provider Arvilla Meres, MD       Initial Encounter Date:  Flowsheet Row INTENSIVE CARDIAC REHAB ORIENT from 08/16/2022 in [facility]  Date 08/16/22       Visit Diagnosis: 07/13/22 S/P DES Prox/Mid LAD  Patient's Home Medications on Admission:  Current Outpatient Medications:    acetaminophen (TYLENOL) 500 MG tablet, Take 500 mg by mouth every 6 (six) hours as needed for moderate pain., Disp: , Rfl:    allopurinol (ZYLOPRIM) 100 MG tablet, Take 100 mg by mouth daily., Disp: , Rfl:    amiodarone (PACERONE) 200 MG tablet, Take 0.5 tablets (100 mg total) by mouth daily., Disp: 45 tablet, Rfl: 3   buPROPion (WELLBUTRIN XL) 300 MG 24 hr tablet, Take 300 mg by mouth daily., Disp: , Rfl:    carvedilol (COREG) 6.25 MG tablet, TAKE 1 TABLET BY MOUTH 2 TIMES DAILY WITH A MEAL., Disp: 180 tablet, Rfl: 3   clonazePAM (KLONOPIN) 0.5 MG tablet, Take 0.5 mg by mouth 2 (two) times daily as needed for anxiety., Disp: , Rfl:    clopidogrel (PLAVIX) 75 MG tablet, Take 1 tablet (75 mg total) by mouth daily., Disp: 90 tablet, Rfl: 3   colchicine 0.6 MG tablet, Take 0.6 mg by mouth 2 (two) times daily as needed (gout)., Disp: , Rfl:    dapagliflozin propanediol (FARXIGA) 10 MG TABS tablet, TAKE 1 TABLET BY MOUTH EVERY DAY, Disp: 30 tablet, Rfl: 0   doxazosin (CARDURA) 1 MG tablet, TAKE 1 TABLET BY MOUTH EVERYDAY AT BEDTIME, Disp: 71 tablet, Rfl: 0   fenofibrate (TRICOR) 145 MG tablet, Take 145 mg by mouth daily., Disp: , Rfl:    furosemide (LASIX) 20 MG tablet, TAKE 1 TABLET BY MOUTH AS NEEDED FOR FLUID OR EDEMA, Disp: 30 tablet, Rfl: 5   isosorbide mononitrate (IMDUR) 30  MG 24 hr tablet, Take 1 tablet (30 mg total) by mouth daily., Disp: 90 tablet, Rfl: 1   Multiple Vitamin (MULTIVITAMIN WITH MINERALS) TABS tablet, Take 1 tablet by mouth daily., Disp: , Rfl:    nitroGLYCERIN (NITROSTAT) 0.4 MG SL tablet, Place 1 tablet (0.4 mg total) under the tongue every 5 (five) minutes as needed for chest pain., Disp: 25 tablet, Rfl: 3   potassium chloride SA (KLOR-CON) 20 MEQ tablet, Take 1 tablet (20 mEq total) by mouth as needed (with Lasix)., Disp: 90 tablet, Rfl: 3   rosuvastatin (CRESTOR) 40 MG tablet, TAKE 1 TABLET BY MOUTH EVERY DAY, Disp: 30 tablet, Rfl: 0   sacubitril-valsartan (ENTRESTO) 97-103 MG, TAKE 1 TABLET BY MOUTH TWICE A DAY, Disp: 60 tablet, Rfl: 1   spironolactone (ALDACTONE) 25 MG tablet, TAKE 1 TABLET (25 MG TOTAL) BY MOUTH DAILY., Disp: 30 tablet, Rfl: 6   traMADol (ULTRAM) 50 MG tablet, Take 50 mg by mouth 3 (three) times daily as needed for moderate pain., Disp: , Rfl:    XARELTO 20 MG TABS tablet, TAKE 1 TABLET BY MOUTH EVERY DAY, Disp: 30 tablet, Rfl: 5   Semaglutide-Weight Management (WEGOVY) 1 MG/0.5ML SOAJ, Inject 1 mg into the skin once a week. (Patient not taking: Reported on 08/16/2022), Disp: 2 mL, Rfl: 11  Past Medical History: Past Medical History:  Diagnosis Date  Arrhythmia    Atrial fibrillation    CAD (coronary artery disease) 08/17/2020   PCI + DES to pLCx, 70% mLAD negative FFR testing (medical therapy)    CHF (congestive heart failure)    Chronic systolic heart failure    Gout    Hyperlipidemia    Hypertension    OSA (obstructive sleep apnea) 09/2020    Tobacco Use: Social History   Tobacco Use  Smoking Status Former   Types: Cigars   Quit date: 08/14/2020   Years since quitting: 2.0  Smokeless Tobacco Never    Labs: Review Flowsheet  More data exists      Latest Ref Rng & Units 05/18/2020 08/17/2020 06/24/2021 03/22/2022 07/13/2022  Labs for ITP Cardiac and Pulmonary Rehab  Cholestrol 0 - 200 mg/dL - - 161  96  87    LDL (calc) 0 - 99 mg/dL - - 60  37  12   HDL-C >40 mg/dL - - 34  32  28   Trlycerides <150 mg/dL - - 096  045  409   Hemoglobin A1c 4.8 - 5.6 % - - 6.0  5.5  5.6   PH, Arterial 7.350 - 7.450 - 7.400  - - -  PCO2 arterial 32.0 - 48.0 mmHg - 38.4  - - -  Bicarbonate 20.0 - 28.0 mmol/L - 23.8  - - -  TCO2 22 - 32 mmol/L - 25  - - -  Acid-base deficit 0.0 - 2.0 mmol/L - 1.0  - - -  O2 Saturation % 59.6  99.0  - - -    Capillary Blood Glucose: No results found for: "GLUCAP"   Exercise Target Goals: Exercise Program Goal: Individual exercise prescription set using results from initial 6 min walk test and THRR while considering  patient's activity barriers and safety.   Exercise Prescription Goal: Initial exercise prescription builds to 30-45 minutes a day of aerobic activity, 2-3 days per week.  Home exercise guidelines will be given to patient during program as part of exercise prescription that the participant will acknowledge.  Activity Barriers & Risk Stratification:  Activity Barriers & Cardiac Risk Stratification - 08/16/22 0953       Activity Barriers & Cardiac Risk Stratification   Activity Barriers Arthritis;Joint Problems    Cardiac Risk Stratification High             6 Minute Walk:  6 Minute Walk     Row Name 08/16/22 0908         6 Minute Walk   Phase Initial     Distance 1726 feet     Walk Time 6 minutes     # of Rest Breaks 0     MPH 3.3     METS 4     RPE 9     Perceived Dyspnea  0     VO2 Peak 14     Symptoms Yes (comment)     Comments Low back tightness, no pain     Resting HR 64 bpm     Resting BP 124/86     Resting Oxygen Saturation  95 %     Exercise Oxygen Saturation  during 6 min walk 95 %     Max Ex. HR 87 bpm     Max Ex. BP 128/84     2 Minute Post BP 110/80              Oxygen Initial Assessment:   Oxygen Re-Evaluation:   Oxygen Discharge (  Final Oxygen Re-Evaluation):   Initial Exercise Prescription:  Initial  Exercise Prescription - 08/16/22 0900       Date of Initial Exercise RX and Referring Provider   Date 08/16/22    Referring Provider Arvilla Meres, MD    Expected Discharge Date 10/28/22      Recumbant Bike   Level 2.5    RPM 60    Watts 50    Minutes 15    METs 4      Recumbant Elliptical   Level 3    RPM 60    Watts 25    Minutes 15    METs 4      Prescription Details   Frequency (times per week) 3    Duration Progress to 30 minutes of continuous aerobic without signs/symptoms of physical distress      Intensity   THRR 40-80% of Max Heartrate 70 - 139    Ratings of Perceived Exertion 11-13    Perceived Dyspnea 0-4      Progression   Progression Continue progressive overload as per policy without signs/symptoms or physical distress.      Resistance Training   Training Prescription Yes    Weight 5 lbs    Reps 10-15             Perform Capillary Blood Glucose checks as needed.  Exercise Prescription Changes:   Exercise Comments:   Exercise Goals and Review:   Exercise Goals     Row Name 08/16/22 0810             Exercise Goals   Increase Physical Activity Yes       Intervention Provide advice, education, support and counseling about physical activity/exercise needs.;Develop an individualized exercise prescription for aerobic and resistive training based on initial evaluation findings, risk stratification, comorbidities and participant's personal goals.       Expected Outcomes Short Term: Attend rehab on a regular basis to increase amount of physical activity.;Long Term: Add in home exercise to make exercise part of routine and to increase amount of physical activity.;Long Term: Exercising regularly at least 3-5 days a week.       Increase Strength and Stamina Yes       Intervention Provide advice, education, support and counseling about physical activity/exercise needs.;Develop an individualized exercise prescription for aerobic and resistive  training based on initial evaluation findings, risk stratification, comorbidities and participant's personal goals.       Expected Outcomes Short Term: Increase workloads from initial exercise prescription for resistance, speed, and METs.;Short Term: Perform resistance training exercises routinely during rehab and add in resistance training at home;Long Term: Improve cardiorespiratory fitness, muscular endurance and strength as measured by increased METs and functional capacity ( )       Able to understand and use rate of perceived exertion (RPE) scale Yes       Intervention Provide education and explanation on how to use RPE scale       Expected Outcomes Short Term: Able to use RPE daily in rehab to express subjective intensity level;Long Term:  Able to use RPE to guide intensity level when exercising independently       Knowledge and understanding of Target Heart Rate Range (THRR) Yes       Intervention Provide education and explanation of THRR including how the numbers were predicted and where they are located for reference       Expected Outcomes Short Term: Able to state/look up THRR;Long Term: Able to use  THRR to govern intensity when exercising independently;Short Term: Able to use daily as guideline for intensity in rehab       Understanding of Exercise Prescription Yes       Intervention Provide education, explanation, and written materials on patient's individual exercise prescription       Expected Outcomes Short Term: Able to explain program exercise prescription;Long Term: Able to explain home exercise prescription to exercise independently                Exercise Goals Re-Evaluation :   Discharge Exercise Prescription (Final Exercise Prescription Changes):   Nutrition:  Target Goals: Understanding of nutrition guidelines, daily intake of sodium 1500mg , cholesterol 200mg , calories 30% from fat and 7% or less from saturated fats, daily to have 5 or more servings of fruits  and vegetables.  Biometrics:  Pre Biometrics - 08/16/22 0815       Pre Biometrics   Waist Circumference 54 inches    Hip Circumference 49.5 inches    Waist to Hip Ratio 1.09 %    Triceps Skinfold 15 mm    % Body Fat 37.5 %    Grip Strength 54 kg    Flexibility 0 in   Cannot reach   Single Leg Stand 22.68 seconds              Nutrition Therapy Plan and Nutrition Goals:   Nutrition Assessments:  MEDIFICTS Score Key: ?70 Need to make dietary changes  40-70 Heart Healthy Diet ? 40 Therapeutic Level Cholesterol Diet   Flowsheet Row CARDIAC REHAB PHASE II EXERCISE from 07/31/2020 in [facility]  Picture Your Plate Total Score on Admission 66      Picture Your Plate Scores: <16 Unhealthy dietary pattern with much room for improvement. 41-50 Dietary pattern unlikely to meet recommendations for good health and room for improvement. 51-60 More healthful dietary pattern, with some room for improvement.  >60 Healthy dietary pattern, although there may be some specific behaviors that could be improved.    Nutrition Goals Re-Evaluation:   Nutrition Goals Re-Evaluation:   Nutrition Goals Discharge (Final Nutrition Goals Re-Evaluation):   Psychosocial: Target Goals: Acknowledge presence or absence of significant depression and/or stress, maximize coping skills, provide positive support system. Participant is able to verbalize types and ability to use techniques and skills needed for reducing stress and depression.  Initial Review & Psychosocial Screening:  Initial Psych Review & Screening - 08/16/22 0904       Initial Review   Current issues with History of Depression      Family Dynamics   Good Support System? Yes   Has fiance and 2 teenage children for support.     Barriers   Psychosocial barriers to participate in program There are no identifiable barriers or psychosocial needs.      Screening  Interventions   Interventions Encouraged to exercise    Expected Outcomes Long Term Goal: Stressors or current issues are controlled or eliminated.             Quality of Life Scores:  Quality of Life - 08/16/22 1001       Quality of Life   Select Quality of Life      Quality of Life Scores   Health/Function Pre 23.47 %    Socioeconomic Pre 23.86 %    Psych/Spiritual Pre 18.86 %    Family Pre 28.3 %    GLOBAL Pre 23.31 %  Scores of 19 and below usually indicate a poorer quality of life in these areas.  A difference of  2-3 points is a clinically meaningful difference.  A difference of 2-3 points in the total score of the Quality of Life Index has been associated with significant improvement in overall quality of life, self-image, physical symptoms, and general health in studies assessing change in quality of life.  PHQ-9: Review Flowsheet       08/16/2022 07/17/2020 02/24/2020  Depression screen PHQ 2/9  Decreased Interest 0 0 0  Down, Depressed, Hopeless 0 0 0  PHQ - 2 Score 0 0 0  Altered sleeping 0 - -  Tired, decreased energy 0 - -  Change in appetite 2 - -  Feeling bad or failure about yourself  0 - -  Trouble concentrating 1 - -  Moving slowly or fidgety/restless 0 - -  Suicidal thoughts 0 - -  PHQ-9 Score 3 - -  Difficult doing work/chores Not difficult at all - -   Interpretation of Total Score  Total Score Depression Severity:  1-4 = Minimal depression, 5-9 = Mild depression, 10-14 = Moderate depression, 15-19 = Moderately severe depression, 20-27 = Severe depression   Psychosocial Evaluation and Intervention:   Psychosocial Re-Evaluation:   Psychosocial Discharge (Final Psychosocial Re-Evaluation):   Vocational Rehabilitation: Provide vocational rehab assistance to qualifying candidates.   Vocational Rehab Evaluation & Intervention:  Vocational Rehab - 08/16/22 0906       Initial Vocational Rehab Evaluation & Intervention    Assessment shows need for Vocational Rehabilitation No   Macio works full time and does not need vocational rehab at this time.            Education: Education Goals: Education classes will be provided on a weekly basis, covering required topics. Participant will state understanding/return demonstration of topics presented.     Core Videos: Exercise    Move It!  Clinical staff conducted group or individual video education with verbal and written material and guidebook.  Patient learns the recommended Pritikin exercise program. Exercise with the goal of living a long, healthy life. Some of the health benefits of exercise include controlled diabetes, healthier blood pressure levels, improved cholesterol levels, improved heart and lung capacity, improved sleep, and better body composition. Everyone should speak with their doctor before starting or changing an exercise routine.  Biomechanical Limitations Clinical staff conducted group or individual video education with verbal and written material and guidebook.  Patient learns how biomechanical limitations can impact exercise and how we can mitigate and possibly overcome limitations to have an impactful and balanced exercise routine.  Body Composition Clinical staff conducted group or individual video education with verbal and written material and guidebook.  Patient learns that body composition (ratio of muscle mass to fat mass) is a key component to assessing overall fitness, rather than body weight alone. Increased fat mass, especially visceral belly fat, can put [place] at increased risk for metabolic syndrome, type 2 diabetes, heart disease, and even death. It is recommended to combine diet and exercise (cardiovascular and resistance training) to improve your body composition. Seek guidance from your physician and exercise physiologist before implementing an exercise routine.  Exercise Action Plan Clinical staff conducted group or individual  video education with verbal and written material and guidebook.  Patient learns the recommended strategies to achieve and enjoy long-term exercise adherence, including variety, self-motivation, self-efficacy, and positive decision making. Benefits of exercise include fitness, good health, weight management, more energy,  better sleep, less stress, and overall well-being.  Medical   Heart Disease Risk Reduction Clinical staff conducted group or individual video education with verbal and written material and guidebook.  Patient learns our heart is our most vital organ as it circulates oxygen, nutrients, white blood cells, and hormones throughout the entire body, and carries waste away. Data supports a plant-based eating plan like the Pritikin Program for its effectiveness in slowing progression of and reversing heart disease. The video provides a number of recommendations to address heart disease.   Metabolic Syndrome and Belly Fat  Clinical staff conducted group or individual video education with verbal and written material and guidebook.  Patient learns what metabolic syndrome is, how it leads to heart disease, and how one can reverse it and keep it from coming back. You have metabolic syndrome if you have 3 of the following 5 criteria: abdominal obesity, high blood pressure, high triglycerides, low HDL cholesterol, and high blood sugar.  Hypertension and Heart Disease Clinical staff conducted group or individual video education with verbal and written material and guidebook.  Patient learns that high blood pressure, or hypertension, is very common in the [place]. Hypertension is largely due to excessive salt intake, but other important risk factors include being overweight, physical inactivity, drinking too much alcohol, smoking, and not eating enough potassium from fruits and vegetables. High blood pressure is a leading risk factor for heart attack, stroke, congestive heart failure, dementia,  kidney failure, and premature death. Long-term effects of excessive salt intake include stiffening of the arteries and thickening of heart muscle and organ damage. Recommendations include ways to reduce hypertension and the risk of heart disease.  Diseases of Our Time - Focusing on Diabetes Clinical staff conducted group or individual video education with verbal and written material and guidebook.  Patient learns why the best way to stop diseases of our time is prevention, through food and other lifestyle changes. Medicine (such as prescription pills and surgeries) is often only a Band-Aid on the problem, not a long-term solution. Most common diseases of our time include obesity, type 2 diabetes, hypertension, heart disease, and cancer. The Pritikin Program is recommended and has been proven to help reduce, reverse, and/or prevent the damaging effects of metabolic syndrome.  Nutrition   Overview of the Pritikin Eating Plan  Clinical staff conducted group or individual video education with verbal and written material and guidebook.  Patient learns about the Pritikin Eating Plan for disease risk reduction. The Pritikin Eating Plan emphasizes a wide variety of unrefined, minimally-processed carbohydrates, like fruits, vegetables, whole grains, and legumes. Go, Caution, and Stop food choices are explained. Plant-based and lean animal proteins are emphasized. Rationale provided for low sodium intake for blood pressure control, low added sugars for blood sugar stabilization, and low added fats and oils for coronary artery disease risk reduction and weight management.  Calorie Density  Clinical staff conducted group or individual video education with verbal and written material and guidebook.  Patient learns about calorie density and how it impacts the Pritikin Eating Plan. Knowing the characteristics of the food you choose will help you decide whether those foods will lead to weight gain or weight loss, and  whether you want to consume more or less of them. Weight loss is usually a side effect of the Pritikin Eating Plan because of its focus on low calorie-dense foods.  Label Reading  Clinical staff conducted group or individual video education with verbal and written material and guidebook.  Patient  learns about the Pritikin recommended label reading guidelines and corresponding recommendations regarding calorie density, added sugars, sodium content, and whole grains.  Dining Out - Part 1  Clinical staff conducted group or individual video education with verbal and written material and guidebook.  Patient learns that restaurant meals can be sabotaging because they can be so high in calories, fat, sodium, and/or sugar. Patient learns recommended strategies on how to positively address this and avoid unhealthy pitfalls.  Facts on Fats  Clinical staff conducted group or individual video education with verbal and written material and guidebook.  Patient learns that lifestyle modifications can be just as effective, if not more so, as many medications for lowering your risk of heart disease. A Pritikin lifestyle can help to reduce your risk of inflammation and atherosclerosis (cholesterol build-up, or plaque, in the artery walls). Lifestyle interventions such as dietary choices and physical activity address the cause of atherosclerosis. A review of the types of fats and their impact on blood cholesterol levels, along with dietary recommendations to reduce fat intake is also included.  Nutrition Action Plan  Clinical staff conducted group or individual video education with verbal and written material and guidebook.  Patient learns how to incorporate Pritikin recommendations into their lifestyle. Recommendations include planning and keeping personal health goals in mind as an important part of their success.  Healthy Mind-Set    Healthy Minds, Bodies, Hearts  Clinical staff conducted group or individual  video education with verbal and written material and guidebook.  Patient learns how to identify when they are stressed. Video will discuss the impact of that stress, as well as the many benefits of stress management. Patient will also be introduced to stress management techniques. The way we think, act, and feel has an impact on our hearts.  How Our Thoughts Can Heal Our Hearts  Clinical staff conducted group or individual video education with verbal and written material and guidebook.  Patient learns that negative thoughts can cause depression and anxiety. This can result in negative lifestyle behavior and serious health problems. Cognitive behavioral therapy is an effective method to help control our thoughts in order to change and improve our emotional outlook.  Additional Videos:  Exercise    Improving Performance  Clinical staff conducted group or individual video education with verbal and written material and guidebook.  Patient learns to use a non-linear approach by alternating intensity levels and lengths of time spent exercising to help burn more calories and lose more body fat. Cardiovascular exercise helps improve heart health, metabolism, hormonal balance, blood sugar control, and recovery from fatigue. Resistance training improves strength, endurance, balance, coordination, reaction time, metabolism, and muscle mass. Flexibility exercise improves circulation, posture, and balance. Seek guidance from your physician and exercise physiologist before implementing an exercise routine and learn your capabilities and proper form for all exercise.  Introduction to Yoga  Clinical staff conducted group or individual video education with verbal and written material and guidebook.  Patient learns about yoga, a discipline of the coming together of mind, breath, and body. The benefits of yoga include improved flexibility, improved range of motion, better posture and core strength, increased lung  function, weight loss, and positive self-image. Yoga's heart health benefits include lowered blood pressure, healthier heart rate, decreased cholesterol and triglyceride levels, improved immune function, and reduced stress. Seek guidance from your physician and exercise physiologist before implementing an exercise routine and learn your capabilities and proper form for all exercise.  Medical   Aging: Enhancing Your  Quality of Life  Clinical staff conducted group or individual video education with verbal and written material and guidebook.  Patient learns key strategies and recommendations to stay in good physical health and enhance quality of life, such as prevention strategies, having an advocate, securing a Health Care Proxy and Power of Attorney, and keeping a list of medications and system for tracking them. It also discusses how to avoid risk for bone loss.  Biology of Weight Control  Clinical staff conducted group or individual video education with verbal and written material and guidebook.  Patient learns that weight gain occurs because we consume more calories than we burn (eating more, moving less). Even if your body weight is normal, you may have higher ratios of fat compared to muscle mass. Too much body fat puts you at increased risk for cardiovascular disease, heart attack, stroke, type 2 diabetes, and obesity-related cancers. In addition to exercise, following the Pritikin Eating Plan can help reduce your risk.  Decoding Lab Results  Clinical staff conducted group or individual video education with verbal and written material and guidebook.  Patient learns that lab test reflects one measurement whose values change over time and are influenced by many factors, including medication, stress, sleep, exercise, food, hydration, pre-existing medical conditions, and more. It is recommended to use the knowledge from this video to become more involved with your lab results and evaluate your numbers  to speak with your doctor.   Diseases of Our Time - Overview  Clinical staff conducted group or individual video education with verbal and written material and guidebook.  Patient learns that according to the CDC, 50% to 70% of chronic diseases (such as obesity, type 2 diabetes, elevated lipids, hypertension, and heart disease) are avoidable through lifestyle improvements including healthier food choices, listening to satiety cues, and increased physical activity.  Sleep Disorders Clinical staff conducted group or individual video education with verbal and written material and guidebook.  Patient learns how good quality and duration of sleep are important to overall health and well-being. Patient also learns about sleep disorders and how they impact health along with recommendations to address them, including discussing with a physician.  Nutrition  Dining Out - Part 2 Clinical staff conducted group or individual video education with verbal and written material and guidebook.  Patient learns how to plan ahead and communicate in order to maximize their dining experience in a healthy and nutritious manner. Included are recommended food choices based on the type of restaurant the patient is visiting.   Fueling a Banker conducted group or individual video education with verbal and written material and guidebook.  There is a strong connection between our food choices and our health. Diseases like obesity and type 2 diabetes are very prevalent and are in large-part due to lifestyle choices. The Pritikin Eating Plan provides plenty of food and hunger-curbing satisfaction. It is easy to follow, affordable, and helps reduce health risks.  Menu Workshop  Clinical staff conducted group or individual video education with verbal and written material and guidebook.  Patient learns that restaurant meals can sabotage health goals because they are often packed with calories, fat, sodium,  and sugar. Recommendations include strategies to plan ahead and to communicate with the manager, chef, or server to help order a healthier meal.  Planning Your Eating Strategy  Clinical staff conducted group or individual video education with verbal and written material and guidebook.  Patient learns about the Pritikin Eating Plan and its benefit  of reducing the risk of disease. The Pritikin Eating Plan does not focus on calories. Instead, it emphasizes high-quality, nutrient-rich foods. By knowing the characteristics of the foods, we choose, we can determine their calorie density and make informed decisions.  Targeting Your Nutrition Priorities  Clinical staff conducted group or individual video education with verbal and written material and guidebook.  Patient learns that lifestyle habits have a tremendous impact on disease risk and progression. This video provides eating and physical activity recommendations based on your personal health goals, such as reducing LDL cholesterol, losing weight, preventing or controlling type 2 diabetes, and reducing high blood pressure.  Vitamins and Minerals  Clinical staff conducted group or individual video education with verbal and written material and guidebook.  Patient learns different ways to obtain key vitamins and minerals, including through a recommended healthy diet. It is important to discuss all supplements you take with your doctor.   Healthy Mind-Set    Smoking Cessation  Clinical staff conducted group or individual video education with verbal and written material and guidebook.  Patient learns that cigarette smoking and tobacco addiction pose a serious health risk which affects millions of people. Stopping smoking will significantly reduce the risk of heart disease, lung disease, and many forms of cancer. Recommended strategies for quitting are covered, including working with your doctor to develop a successful plan.  Culinary   Becoming a  Set designer conducted group or individual video education with verbal and written material and guidebook.  Patient learns that cooking at home can be healthy, cost-effective, quick, and puts them in control. Keys to cooking healthy recipes will include looking at your recipe, assessing your equipment needs, planning ahead, making it simple, choosing cost-effective seasonal ingredients, and limiting the use of added fats, salts, and sugars.  Cooking - Breakfast and Snacks  Clinical staff conducted group or individual video education with verbal and written material and guidebook.  Patient learns how important breakfast is to satiety and nutrition through the entire day. Recommendations include key foods to eat during breakfast to help stabilize blood sugar levels and to prevent overeating at meals later in the day. Planning ahead is also a key component.  Cooking - Educational psychologist conducted group or individual video education with verbal and written material and guidebook.  Patient learns eating strategies to improve overall health, including an approach to cook more at home. Recommendations include thinking of animal protein as a side on your plate rather than center stage and focusing instead on lower calorie dense options like vegetables, fruits, whole grains, and plant-based proteins, such as beans. Making sauces in large quantities to freeze for later and leaving the skin on your vegetables are also recommended to maximize your experience.  Cooking - Healthy Salads and Dressing Clinical staff conducted group or individual video education with verbal and written material and guidebook.  Patient learns that vegetables, fruits, whole grains, and legumes are the foundations of the Pritikin Eating Plan. Recommendations include how to incorporate each of these in flavorful and healthy salads, and how to create homemade salad dressings. Proper handling of ingredients is  also covered. Cooking - Soups and [employer] - Soups and Desserts Clinical staff conducted group or individual video education with verbal and written material and guidebook.  Patient learns that Pritikin soups and desserts make for easy, nutritious, and delicious snacks and meal components that are low in sodium, fat, sugar, and calorie density, while high in  vitamins, minerals, and filling fiber. Recommendations include simple and healthy ideas for soups and desserts.   Overview     The Pritikin Solution Program Overview Clinical staff conducted group or individual video education with verbal and written material and guidebook.  Patient learns that the results of the Pritikin Program have been documented in more than 100 articles published in peer-reviewed journals, and the benefits include reducing risk factors for (and, in some cases, even reversing) high cholesterol, high blood pressure, type 2 diabetes, obesity, and more! An overview of the three key pillars of the Pritikin Program will be covered: eating well, doing regular exercise, and having a healthy mind-set.  WORKSHOPS  Exercise: Exercise Basics: Building Your Action Plan Clinical staff led group instruction and group discussion with PowerPoint presentation and patient guidebook. To enhance the learning environment the use of posters, models and videos may be added. At the conclusion of this workshop, patients will comprehend the difference between physical activity and exercise, as well as the benefits of incorporating both, into their routine. Patients will understand the FITT (Frequency, Intensity, Time, and Type) principle and how to use it to build an exercise action plan. In addition, safety concerns and other considerations for exercise and cardiac rehab will be addressed by the presenter. The purpose of this lesson is to promote a comprehensive and effective weekly exercise routine in order to improve patients' overall  level of fitness.   Managing Heart Disease: Your Path to a Healthier Heart Clinical staff led group instruction and group discussion with PowerPoint presentation and patient guidebook. To enhance the learning environment the use of posters, models and videos may be added.At the conclusion of this workshop, patients will understand the anatomy and physiology of the heart. Additionally, they will understand how Pritikin's three pillars impact the risk factors, the progression, and the management of heart disease.  The purpose of this lesson is to provide a high-level overview of the heart, heart disease, and how the Pritikin lifestyle positively impacts risk factors.  Exercise Biomechanics Clinical staff led group instruction and group discussion with PowerPoint presentation and patient guidebook. To enhance the learning environment the use of posters, models and videos may be added. Patients will learn how the structural parts of their bodies function and how these functions impact their daily activities, movement, and exercise. Patients will learn how to promote a neutral spine, learn how to manage pain, and identify ways to improve their physical movement in order to promote healthy living. The purpose of this lesson is to expose patients to common physical limitations that impact physical activity. Participants will learn practical ways to adapt and manage aches and pains, and to minimize their effect on regular exercise. Patients will learn how to maintain good posture while sitting, walking, and lifting.  Balance Training and Fall Prevention  Clinical staff led group instruction and group discussion with PowerPoint presentation and patient guidebook. To enhance the learning environment the use of posters, models and videos may be added. At the conclusion of this workshop, patients will understand the importance of their sensorimotor skills (vision, proprioception, and the vestibular  system) in maintaining their ability to balance as they age. Patients will apply a variety of balancing exercises that are appropriate for their current level of function. Patients will understand the common causes for poor balance, possible solutions to these problems, and ways to modify their physical environment in order to minimize their fall risk. The purpose of this lesson is to teach patients about the  importance of maintaining balance as they age and ways to minimize their risk of falling.  WORKSHOPS   Nutrition:  Fueling a Ship broker led group instruction and group discussion with PowerPoint presentation and patient guidebook. To enhance the learning environment the use of posters, models and videos may be added. Patients will review the foundational principles of the Pritikin Eating Plan and understand what constitutes a serving size in each of the food groups. Patients will also learn Pritikin-friendly foods that are better choices when away from home and review make-ahead meal and snack options. Calorie density will be reviewed and applied to three nutrition priorities: weight maintenance, weight loss, and weight gain. The purpose of this lesson is to reinforce (in a group setting) the key concepts around what patients are recommended to eat and how to apply these guidelines when away from home by planning and selecting Pritikin-friendly options. Patients will understand how calorie density may be adjusted for different weight management goals.  Mindful Eating  Clinical staff led group instruction and group discussion with PowerPoint presentation and patient guidebook. To enhance the learning environment the use of posters, models and videos may be added. Patients will briefly review the concepts of the Pritikin Eating Plan and the importance of low-calorie dense foods. The concept of mindful eating will be introduced as well as the importance of paying attention to internal  hunger signals. Triggers for non-hunger eating and techniques for dealing with triggers will be explored. The purpose of this lesson is to provide patients with the opportunity to review the basic principles of the Pritikin Eating Plan, discuss the value of eating mindfully and how to measure internal cues of hunger and fullness using the Hunger Scale. Patients will also discuss reasons for non-hunger eating and learn strategies to use for controlling emotional eating.  Targeting Your Nutrition Priorities Clinical staff led group instruction and group discussion with PowerPoint presentation and patient guidebook. To enhance the learning environment the use of posters, models and videos may be added. Patients will learn how to determine their genetic susceptibility to disease by reviewing their family history. Patients will gain insight into the importance of diet as part of an overall healthy lifestyle in mitigating the impact of genetics and other environmental insults. The purpose of this lesson is to provide patients with the opportunity to assess their personal nutrition priorities by looking at their family history, their own health history and current risk factors. Patients will also be able to discuss ways of prioritizing and modifying the Pritikin Eating Plan for their highest risk areas  Menu  Clinical staff led group instruction and group discussion with PowerPoint presentation and patient guidebook. To enhance the learning environment the use of posters, models and videos may be added. Using menus brought in from [employer], or printed from [employer], patients will apply the Pritikin dining out guidelines that were presented in the [employer] video. Patients will also be able to practice these guidelines in a variety of provided scenarios. The purpose of this lesson is to provide patients with the opportunity to practice hands-on learning of the Pritikin Dining Out  guidelines with actual menus and practice scenarios.  Label Reading Clinical staff led group instruction and group discussion with PowerPoint presentation and patient guidebook. To enhance the learning environment the use of posters, models and videos may be added. Patients will review and discuss the Pritikin label reading guidelines presented in Pritikin's Label Reading Educational series video. Using fool  labels brought in from local grocery stores and markets, patients will apply the label reading guidelines and determine if the packaged food meet the Pritikin guidelines. The purpose of this lesson is to provide patients with the opportunity to review, discuss, and practice hands-on learning of the Pritikin Label Reading guidelines with actual packaged food labels. Cooking School  Pritikin's [employer] are designed to teach patients ways to prepare quick, simple, and affordable recipes at home. The importance of nutrition's role in chronic disease risk reduction is reflected in its emphasis in the overall Pritikin program. By learning how to prepare essential core Pritikin Eating Plan recipes, patients will increase control over what they eat; be able to customize the flavor of foods without the use of added salt, sugar, or fat; and improve the quality of the food they consume. By learning a set of core recipes which are easily assembled, quickly prepared, and affordable, patients are more likely to prepare more healthy foods at home. These workshops focus on convenient breakfasts, simple entres, side dishes, and desserts which can be prepared with minimal effort and are consistent with nutrition recommendations for cardiovascular risk reduction. Cooking [employer] are taught by a Armed forces logistics/support/administrative officer (RD) who has been trained by the [employer]. The chef or RD has a clear understanding of the importance of minimizing - if not completely eliminating - added fat,  sugar, and sodium in recipes. Throughout the series of Cooking School Workshop sessions, patients will learn about healthy ingredients and efficient methods of cooking to build confidence in their capability to prepare    Cooking School weekly topics:  Adding Flavor- Sodium-Free  Fast and Healthy Breakfasts  Powerhouse Plant-Based Proteins  Satisfying Salads and Dressings  Simple Sides and Sauces  International Cuisine-Spotlight on the [employer] Zones  Delicious Desserts  Savory Soups  [employer] - Meals in a Astronomer Appetizers and Snacks  Comforting Weekend Breakfasts  One-Pot Wonders   Fast Evening Meals  Landscape architect Your Pritikin Plate  WORKSHOPS   Healthy Mindset (Psychosocial):  Focused Goals, Sustainable Changes Clinical staff led group instruction and group discussion with PowerPoint presentation and patient guidebook. To enhance the learning environment the use of posters, models and videos may be added. Patients will be able to apply effective goal setting strategies to establish at least one personal goal, and then take consistent, meaningful action toward that goal. They will learn to identify common barriers to achieving personal goals and develop strategies to overcome them. Patients will also gain an understanding of how our mind-set can impact our ability to achieve goals and the importance of cultivating a positive and growth-oriented mind-set. The purpose of this lesson is to provide patients with a deeper understanding of how to set and achieve personal goals, as well as the tools and strategies needed to overcome common obstacles which may arise along the way.  From Head to Heart: The Power of a Healthy Outlook  Clinical staff led group instruction and group discussion with PowerPoint presentation and patient guidebook. To enhance the learning environment the use of posters, models and videos may be added. Patients will be able to recognize  and describe the impact of emotions and mood on physical health. They will discover the importance of self-care and explore self-care practices which may work for them. Patients will also learn how to utilize the 4 C's to cultivate a healthier outlook and better manage stress and challenges. The purpose of this  lesson is to demonstrate to patients how a healthy outlook is an essential part of maintaining good health, especially as they continue their cardiac rehab journey.  Healthy Sleep for a Healthy Heart Clinical staff led group instruction and group discussion with PowerPoint presentation and patient guidebook. To enhance the learning environment the use of posters, models and videos may be added. At the conclusion of this workshop, patients will be able to demonstrate knowledge of the importance of sleep to overall health, well-being, and quality of life. They will understand the symptoms of, and treatments for, common sleep disorders. Patients will also be able to identify daytime and nighttime behaviors which impact sleep, and they will be able to apply these tools to help manage sleep-related challenges. The purpose of this lesson is to provide patients with a general overview of sleep and outline the importance of quality sleep. Patients will learn about a few of the most common sleep disorders. Patients will also be introduced to the concept of "sleep hygiene," and discover ways to self-manage certain sleeping problems through simple daily behavior changes. Finally, the workshop will motivate patients by clarifying the links between quality sleep and their goals of heart-healthy living.   Recognizing and Reducing Stress Clinical staff led group instruction and group discussion with PowerPoint presentation and patient guidebook. To enhance the learning environment the use of posters, models and videos may be added. At the conclusion of this workshop, patients will be able to understand the types of  stress reactions, differentiate between acute and chronic stress, and recognize the impact that chronic stress has on their health. They will also be able to apply different coping mechanisms, such as reframing negative self-talk. Patients will have the opportunity to practice a variety of stress management techniques, such as deep abdominal breathing, progressive muscle relaxation, and/or guided imagery.  The purpose of this lesson is to educate patients on the role of stress in their lives and to provide healthy techniques for coping with it.  Learning Barriers/Preferences:  Learning Barriers/Preferences - 08/16/22 1001       Learning Barriers/Preferences   Learning Barriers None    Learning Preferences Audio;Computer/Internet;Group Instruction;Individual Instruction;Pictoral;Skilled Demonstration;Verbal Instruction;Video;Written Material             Education Topics:  Knowledge Questionnaire Score:  Knowledge Questionnaire Score - 08/16/22 0919       Knowledge Questionnaire Score   Pre Score 20/24             Core Components/Risk Factors/Patient Goals at Admission:  Personal Goals and Risk Factors at Admission - 08/16/22 1003       Core Components/Risk Factors/Patient Goals on Admission    Weight Management Yes;Obesity;Weight Loss    Intervention Weight Management: Develop a combined nutrition and exercise program designed to reach desired caloric intake, while maintaining appropriate intake of nutrient and fiber, sodium and fats, and appropriate energy expenditure required for the weight goal.;Weight Management: Provide education and appropriate resources to help participant work on and attain dietary goals.;Weight Management/Obesity: Establish reasonable short term and long term weight goals.;Obesity: Provide education and appropriate resources to help participant work on and attain dietary goals.    Admit Weight 312 lb 2.7 oz (141.6 kg)    Expected Outcomes Long Term:  Adherence to nutrition and physical activity/exercise program aimed toward attainment of established weight goal;Short Term: Continue to assess and modify interventions until short term weight is achieved;Weight Loss: Understanding of general recommendations for a balanced deficit meal plan, which promotes 1-2 lb  weight loss per week and includes a negative energy balance of [phone] kcal/d;Understanding of distribution of calorie intake throughout the day with the consumption of 4-5 meals/snacks;Understanding recommendations for meals to include 15-35% energy as protein, 25-35% energy from fat, 35-60% energy from carbohydrates, less than 200mg  of dietary cholesterol, 20-35 gm of total fiber daily    Heart Failure Yes    Intervention Provide a combined exercise and nutrition program that is supplemented with education, support and counseling about heart failure. Directed toward relieving symptoms such as shortness of breath, decreased exercise tolerance, and extremity edema.    Expected Outcomes Improve functional capacity of life;Short term: Attendance in program 2-3 days a week with increased exercise capacity. Reported lower sodium intake. Reported increased fruit and vegetable intake. Reports medication compliance.;Short term: Daily weights obtained and reported for increase. Utilizing diuretic protocols set by physician.;Long term: Adoption of self-care skills and reduction of barriers for early signs and symptoms recognition and intervention leading to self-care maintenance.    Hypertension Yes    Intervention Provide education on lifestyle modifcations including regular physical activity/exercise, weight management, moderate sodium restriction and increased consumption of fresh fruit, vegetables, and low fat dairy, alcohol moderation, and smoking cessation.;Monitor prescription use compliance.    Expected Outcomes Short Term: Continued assessment and intervention until BP is < 140/36mm HG in  hypertensive participants. < 130/34mm HG in hypertensive participants with diabetes, heart failure or chronic kidney disease.;Long Term: Maintenance of blood pressure at goal levels.    Lipids Yes    Intervention Provide education and support for participant on nutrition & aerobic/resistive exercise along with prescribed medications to achieve LDL 70mg , HDL >40mg .    Expected Outcomes Short Term: Participant states understanding of desired cholesterol values and is compliant with medications prescribed. Participant is following exercise prescription and nutrition guidelines.;Long Term: Cholesterol controlled with medications as prescribed, with individualized exercise RX and with personalized nutrition plan. Value goals: LDL < 70mg , HDL > 40 mg.    Stress Yes    Intervention Offer individual and/or small group education and counseling on adjustment to heart disease, stress management and health-related lifestyle change. Teach and support self-help strategies.;Refer participants experiencing significant psychosocial distress to appropriate mental health specialists for further evaluation and treatment. When possible, include family members and significant others in education/counseling sessions.    Expected Outcomes Short Term: Participant demonstrates changes in health-related behavior, relaxation and other stress management skills, ability to obtain effective social support, and compliance with psychotropic medications if prescribed.;Long Term: Emotional wellbeing is indicated by absence of clinically significant psychosocial distress or social isolation.             Core Components/Risk Factors/Patient Goals Review:    Core Components/Risk Factors/Patient Goals at Discharge (Final Review):    ITP Comments:  ITP Comments     Row Name 08/16/22 0806           ITP Comments Armanda Magic, MD: Medical Director.  Introduction to the [facility] / Intensive Cardiac Rehab. Initial  orientation packet reviewed with the patient.                Comments: Participant attended orientation for the cardiac rehabilitation program on  08/16/2022  to perform initial intake and exercise walk test. Patient introduced to the [facility] education and orientation packet was reviewed. Completed 6-minute walk test, measurements, initial ITP, and exercise prescription. Vital signs stable. Telemetry-normal sinus rhythm,with a rare PVC. Camari did report experiencing some low back tightness, denied having  any pain.Thayer HeadingsMaria Walden Maycie Luera RN BSN   Service time was from [phone] to [phone].

## 2022-08-18 ENCOUNTER — Ambulatory Visit: Payer: BC Managed Care – PPO | Admitting: Orthopedic Surgery

## 2022-08-22 ENCOUNTER — Encounter (HOSPITAL_COMMUNITY)
Admission: RE | Admit: 2022-08-22 | Discharge: 2022-08-22 | Disposition: A | Discharge status: Home / Self Care | Payer: BC Managed Care – PPO | Source: Ambulatory Visit | Attending: Internal Medicine | Admitting: Internal Medicine

## 2022-08-22 DIAGNOSIS — I5042 Chronic combined systolic (congestive) and diastolic (congestive) heart failure: Secondary | ICD-10-CM | POA: Diagnosis not present

## 2022-08-22 DIAGNOSIS — Z955 Presence of coronary angioplasty implant and graft: Secondary | ICD-10-CM | POA: Diagnosis not present

## 2022-08-22 DIAGNOSIS — I5041 Acute combined systolic (congestive) and diastolic (congestive) heart failure: Secondary | ICD-10-CM | POA: Diagnosis not present

## 2022-08-22 NOTE — Progress Notes (Signed)
Daily Session Note  Patient Details  Name: Alexander [name] MRN: [id number] Date of Birth: 05/17/1975 Referring Provider:   Flowsheet Row INTENSIVE CARDIAC REHAB ORIENT from 08/16/2022 in [facility]  Referring Provider Arvilla Meres, MD       Encounter Date: 08/22/2022  Check In:  Session Check In - 08/22/22 1317       Check-In   Supervising physician immediately available to respond to emergencies [facility] MD immediately available    Physician(s) Jari Favre, PA    Location [facility]    Staff Present Velora Mediate, RN, MSN;Gale Klar, RN, Marton Redwood, MS, ACSM-CEP, CCRP, Exercise Physiologist;Bailey Wallace Cullens, MS, Exercise Physiologist;Olinty Peggye Pitt, MS, ACSM-CEP, Exercise Physiologist;Johnny Hale Bogus, MS, Exercise Physiologist    Virtual Visit No    Medication changes reported     No    Fall or balance concerns reported    No    Tobacco Cessation No Change    Warm-up and Cool-down Performed as group-led instruction    Resistance Training Performed Yes    VAD Patient? No    PAD/SET Patient? No      Pain Assessment   Currently in Pain? No/denies    Pain Score 0-No pain    Multiple Pain Sites No             Capillary Blood Glucose: No results found for this or any previous visit (from the past 24 hour(s)).   Exercise Prescription Changes - 08/22/22 1700       Response to Exercise   Blood Pressure (Admit) 124/70    Blood Pressure (Exercise) 140/70    Blood Pressure (Exit) 120/80    Heart Rate (Admit) 73 bpm    Heart Rate (Exercise) 93 bpm    Heart Rate (Exit) 81 bpm    Rating of Perceived Exertion (Exercise) 11    Symptoms None    Comments Pt's first day in the [facility] program    Duration Progress to 30 minutes of  aerobic without signs/symptoms of physical distress    Intensity THRR unchanged      Progression   Progression Continue to progress workloads to maintain intensity without  signs/symptoms of physical distress.    Average METs 2.6      Resistance Training   Training Prescription Yes    Weight 5 lbs    Reps 10-15    Time 10 Minutes      Interval Training   Interval Training No      Recumbant Bike   Level 2.5    RPM 77    Watts 47    Minutes 15    METs 3.2      Recumbant Elliptical   Level 3    RPM 70    Watts 90    Minutes 15    METs 2             Social History   Tobacco Use  Smoking Status Former   Types: Cigars   Quit date: 08/14/2020   Years since quitting: 2.0  Smokeless Tobacco Never    Goals Met:  Exercise tolerated well No report of concerns or symptoms today Strength training completed today  Goals Unmet:  Not Applicable  Comments: Pt started cardiac rehab today.  Pt tolerated light exercise without difficulty. VSS, telemetry-Sinus Rhythm, asymptomatic.  Medication list reconciled. Pt denies barriers to medicaiton compliance.  PSYCHOSOCIAL ASSESSMENT:  PHQ-3. Pt exhibits positive coping skills, hopeful outlook with  supportive family. No psychosocial needs identified at this time, no psychosocial interventions necessary.    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   Pt oriented to exercise equipment and routine.    Understanding verbalized.Thayer Headings RN BSN    Dr. Armanda Magic is Medical Director for Cardiac Rehab at [facility].

## 2022-08-24 ENCOUNTER — Encounter (HOSPITAL_COMMUNITY)
Admission: RE | Admit: 2022-08-24 | Discharge: 2022-08-24 | Disposition: A | Discharge status: Home / Self Care | Payer: BC Managed Care – PPO | Source: Ambulatory Visit | Attending: Internal Medicine | Admitting: Internal Medicine

## 2022-08-24 DIAGNOSIS — I5042 Chronic combined systolic (congestive) and diastolic (congestive) heart failure: Secondary | ICD-10-CM

## 2022-08-24 DIAGNOSIS — I5041 Acute combined systolic (congestive) and diastolic (congestive) heart failure: Secondary | ICD-10-CM | POA: Diagnosis not present

## 2022-08-24 DIAGNOSIS — Z955 Presence of coronary angioplasty implant and graft: Secondary | ICD-10-CM

## 2022-08-26 ENCOUNTER — Encounter (HOSPITAL_COMMUNITY): Payer: BC Managed Care – PPO

## 2022-08-29 ENCOUNTER — Ambulatory Visit (INDEPENDENT_AMBULATORY_CARE_PROVIDER_SITE_OTHER): Payer: BC Managed Care – PPO | Admitting: Orthopedic Surgery

## 2022-08-29 ENCOUNTER — Encounter (HOSPITAL_COMMUNITY)
Admission: RE | Admit: 2022-08-29 | Discharge: 2022-08-29 | Disposition: A | Discharge status: Home / Self Care | Payer: BC Managed Care – PPO | Source: Ambulatory Visit | Attending: Internal Medicine | Admitting: Internal Medicine

## 2022-08-29 ENCOUNTER — Other Ambulatory Visit (HOSPITAL_COMMUNITY): Payer: Self-pay | Admitting: Cardiology

## 2022-08-29 ENCOUNTER — Ambulatory Visit (INDEPENDENT_AMBULATORY_CARE_PROVIDER_SITE_OTHER): Payer: BC Managed Care – PPO

## 2022-08-29 DIAGNOSIS — M25511 Pain in right shoulder: Secondary | ICD-10-CM

## 2022-08-29 DIAGNOSIS — Z955 Presence of coronary angioplasty implant and graft: Secondary | ICD-10-CM | POA: Diagnosis not present

## 2022-08-29 DIAGNOSIS — G8929 Other chronic pain: Secondary | ICD-10-CM

## 2022-08-29 DIAGNOSIS — I5042 Chronic combined systolic (congestive) and diastolic (congestive) heart failure: Secondary | ICD-10-CM | POA: Diagnosis not present

## 2022-08-29 DIAGNOSIS — I5041 Acute combined systolic (congestive) and diastolic (congestive) heart failure: Secondary | ICD-10-CM | POA: Diagnosis not present

## 2022-08-29 NOTE — Telephone Encounter (Signed)
Rx request sent to pharmacy.  

## 2022-08-31 ENCOUNTER — Encounter (HOSPITAL_COMMUNITY)
Admission: RE | Admit: 2022-08-31 | Discharge: 2022-08-31 | Disposition: A | Discharge status: Home / Self Care | Payer: BC Managed Care – PPO | Source: Ambulatory Visit | Attending: Internal Medicine | Admitting: Internal Medicine

## 2022-08-31 DIAGNOSIS — Z955 Presence of coronary angioplasty implant and graft: Secondary | ICD-10-CM | POA: Diagnosis not present

## 2022-08-31 DIAGNOSIS — I5042 Chronic combined systolic (congestive) and diastolic (congestive) heart failure: Secondary | ICD-10-CM

## 2022-08-31 DIAGNOSIS — I5041 Acute combined systolic (congestive) and diastolic (congestive) heart failure: Secondary | ICD-10-CM | POA: Diagnosis not present

## 2022-09-02 ENCOUNTER — Encounter (HOSPITAL_COMMUNITY): Payer: BC Managed Care – PPO

## 2022-09-04 ENCOUNTER — Encounter: Payer: Self-pay | Admitting: Orthopedic Surgery

## 2022-09-04 DIAGNOSIS — G8929 Other chronic pain: Secondary | ICD-10-CM | POA: Diagnosis not present

## 2022-09-04 DIAGNOSIS — M25511 Pain in right shoulder: Secondary | ICD-10-CM | POA: Diagnosis not present

## 2022-09-04 MED ORDER — METHYLPREDNISOLONE ACETATE 40 MG/ML IJ SUSP
40.0000 mg | INTRAMUSCULAR | Status: AC | PRN
  Administered 2022-09-04: 40 mg via INTRA_ARTICULAR

## 2022-09-04 MED ORDER — LIDOCAINE HCL 1 % IJ SOLN
5.0000 mL | INTRAMUSCULAR | Status: AC | PRN
  Administered 2022-09-04: 5 mL

## 2022-09-04 NOTE — Progress Notes (Signed)
Office Visit Note   Patient: Alexander [name]           Date of Birth: 02/26/1976           MRN: [id number] Visit Date: 08/29/2022              Requested by: Halford Chessman, MD No address on file PCP: Halford Chessman, MD  Chief Complaint  Patient presents with   Right Shoulder - Pain      HPI: Patient presents with chronic right shoulder pain localized over the deltoid.  Patient states has had pain off and on for several months.  Patient states his shoulder feels weak when he picks something up.  He denies any injuries.  Patient states he has tried Tylenol but cannot take nonsteroidals due to a heart condition.  Assessment & Plan: Visit Diagnoses:  1. Chronic right shoulder pain     Plan: Patient underwent a subacromial injection and he tolerated this well.  Follow-up as needed.  Follow-Up Instructions: Return if symptoms worsen or fail to improve.   Ortho Exam  Patient is alert, oriented, no adenopathy, well-dressed, normal affect, normal respiratory effort. Examination patient has full abduction and flexion no adhesive capsulitis.  He has pain with Neer and Hawkins impingement test he has pain to palpation of the biceps tendon.  Imaging: No results found. No images are attached to the encounter.  Labs: Lab Results  Component Value Date   HGBA1C 5.6 07/13/2022   HGBA1C 5.5 03/22/2022   HGBA1C 6.0 (H) 06/24/2021     Lab Results  Component Value Date   ALBUMIN 4.6 08/05/2022   ALBUMIN 4.7 03/01/2021   ALBUMIN 4.9 06/08/2020    Lab Results  Component Value Date   MG 2.1 05/15/2020   MG 1.9 05/14/2020   MG 1.9 04/26/2020   No results found for: "VD25OH"  No results found for: "PREALBUMIN"    Latest Ref Rng & Units 08/05/2022   12:26 PM 07/14/2022    7:04 AM 07/13/2022    2:38 AM  CBC EXTENDED  WBC 4.0 - 10.5 K/uL 9.8  6.6  8.8   RBC 4.22 - 5.81 MIL/uL 5.61  5.14  5.03   Hemoglobin 13.0 - 17.0 g/dL 40.9  81.1  91.4   HCT 39.0 - 52.0 % 49.8  46.4   45.5   Platelets 150 - 400 K/uL 243  222  218      There is no height or weight on file to calculate BMI.  Orders:  Orders Placed This Encounter  Procedures   XR Shoulder Right   No orders of the defined types were placed in this encounter.    Procedures: Large Joint Inj: R subacromial bursa on 09/04/2022 10:24 AM Indications: diagnostic evaluation and pain Details: 22 G 1.5 in needle, posterior approach  Arthrogram: No  Medications: 5 mL lidocaine 1 %; 40 mg methylPREDNISolone acetate 40 MG/ML Outcome: tolerated well, no immediate complications Procedure, treatment alternatives, risks and benefits explained, specific risks discussed. Consent was given by the patient. Immediately prior to procedure a time out was called to verify the correct patient, procedure, equipment, support staff and site/side marked as required. Patient was prepped and draped in the usual sterile fashion.      Clinical Data: No additional findings.  ROS:  All other systems negative, except as noted in the HPI. Review of Systems  Objective: Vital Signs: There were no vitals taken for this visit.  Specialty Comments:  No specialty comments available.  PMFS History: Patient Active Problem List   Diagnosis Date Noted   Unstable angina (HCC) 07/12/2022   Morbid obesity (HCC) 06/17/2021   Chronic systolic heart failure (HCC)    Coronary artery disease involving native coronary artery of native heart with unstable angina pectoris (HCC)    Acute systolic heart failure (HCC) 05/13/2020   Acute on chronic systolic (congestive) heart failure (HCC) 05/13/2020   Atrial fibrillation with RVR (HCC) 04/26/2020   HLD (hyperlipidemia) 04/26/2020   HTN (hypertension) 04/26/2020   Depression 04/26/2020   Gout 04/26/2020   Panic attacks 04/26/2020   Low back pain 04/26/2020   Past Medical History:  Diagnosis Date   Arrhythmia    Atrial fibrillation (HCC)    CAD (coronary artery disease) 08/17/2020    PCI + DES to pLCx, 70% mLAD negative FFR testing (medical therapy)    CHF (congestive heart failure) (HCC)    Chronic systolic heart failure (HCC)    Gout    Hyperlipidemia    Hypertension    OSA (obstructive sleep apnea) 09/2020    Family History  Problem Relation Age of Onset   COPD Mother     Past Surgical History:  Procedure Laterality Date   CARDIAC CATHETERIZATION     CARDIOVERSION N/A 05/15/2020   Procedure: CARDIOVERSION;  Surgeon: Dolores Patty, MD;  Location: [facility] ENDOSCOPY;  Service: Cardiovascular;  Laterality: N/A;   CORONARY IMAGING/OCT N/A 07/13/2022   Procedure: INTRAVASCULAR IMAGING/OCT;  Surgeon: Marykay Lex, MD;  Location: [facility] INVASIVE CV LAB;  Service: Cardiovascular;  Laterality: N/A;   CORONARY STENT INTERVENTION N/A 08/17/2020   Procedure: CORONARY STENT INTERVENTION;  Surgeon: Kathleene Hazel, MD;  Location: [facility] INVASIVE CV LAB;  Service: Cardiovascular;  Laterality: N/A;   CORONARY STENT INTERVENTION N/A 07/13/2022   Procedure: CORONARY STENT INTERVENTION;  Surgeon: Marykay Lex, MD;  Location: [facility] INVASIVE CV LAB;  Service: Cardiovascular;  Laterality: N/A;   LEFT HEART CATH AND CORONARY ANGIOGRAPHY N/A 07/13/2022   Procedure: LEFT HEART CATH AND CORONARY ANGIOGRAPHY;  Surgeon: Marykay Lex, MD;  Location: [facility] INVASIVE CV LAB;  Service: Cardiovascular;  Laterality: N/A;   RIGHT/LEFT HEART CATH AND CORONARY ANGIOGRAPHY N/A 08/17/2020   Procedure: RIGHT/LEFT HEART CATH AND CORONARY ANGIOGRAPHY;  Surgeon: Dolores Patty, MD;  Location: [facility] INVASIVE CV LAB;  Service: Cardiovascular;  Laterality: N/A;   SEPTOPLASTY     Social History   Occupational History   Not on file  Tobacco Use   Smoking status: Former    Types: Cigars    Quit date: 08/14/2020    Years since quitting: 2.0   Smokeless tobacco: Never  Substance and Sexual Activity   Alcohol use: Yes    Alcohol/week: 1.0 standard drink of alcohol    Types: 1 Cans of beer per week     Comment: drinks beer socially 1 per week   Drug use: Not Currently   Sexual activity: Not on file

## 2022-09-05 ENCOUNTER — Encounter (HOSPITAL_COMMUNITY): Payer: BC Managed Care – PPO

## 2022-09-06 ENCOUNTER — Emergency Department (HOSPITAL_BASED_OUTPATIENT_CLINIC_OR_DEPARTMENT_OTHER): Payer: BC Managed Care – PPO | Admitting: Radiology

## 2022-09-06 ENCOUNTER — Encounter (HOSPITAL_BASED_OUTPATIENT_CLINIC_OR_DEPARTMENT_OTHER): Payer: Self-pay

## 2022-09-06 ENCOUNTER — Other Ambulatory Visit: Payer: Self-pay

## 2022-09-06 ENCOUNTER — Emergency Department (HOSPITAL_BASED_OUTPATIENT_CLINIC_OR_DEPARTMENT_OTHER)
Admission: EM | Admit: 2022-09-06 | Discharge: 2022-09-06 | Disposition: A | Discharge status: Home / Self Care | Payer: BC Managed Care – PPO | Attending: Emergency Medicine | Admitting: Emergency Medicine

## 2022-09-06 DIAGNOSIS — Z79899 Other long term (current) drug therapy: Secondary | ICD-10-CM | POA: Diagnosis not present

## 2022-09-06 DIAGNOSIS — I10 Essential (primary) hypertension: Secondary | ICD-10-CM

## 2022-09-06 DIAGNOSIS — I2510 Atherosclerotic heart disease of native coronary artery without angina pectoris: Secondary | ICD-10-CM | POA: Insufficient documentation

## 2022-09-06 DIAGNOSIS — Z7901 Long term (current) use of anticoagulants: Secondary | ICD-10-CM | POA: Insufficient documentation

## 2022-09-06 DIAGNOSIS — I110 Hypertensive heart disease with heart failure: Secondary | ICD-10-CM | POA: Diagnosis not present

## 2022-09-06 DIAGNOSIS — R079 Chest pain, unspecified: Secondary | ICD-10-CM | POA: Diagnosis not present

## 2022-09-06 DIAGNOSIS — I509 Heart failure, unspecified: Secondary | ICD-10-CM | POA: Insufficient documentation

## 2022-09-06 DIAGNOSIS — Z7902 Long term (current) use of antithrombotics/antiplatelets: Secondary | ICD-10-CM | POA: Diagnosis not present

## 2022-09-06 LAB — CBC WITH DIFFERENTIAL/PLATELET
Abs Immature Granulocytes: 0.07 10*3/uL (ref 0.00–0.07)
Basophils Absolute: 0.1 10*3/uL (ref 0.0–0.1)
Basophils Relative: 1 %
Eosinophils Absolute: 0.2 10*3/uL (ref 0.0–0.5)
Eosinophils Relative: 2 %
HCT: 47.3 % (ref 39.0–52.0)
Hemoglobin: 15.8 g/dL (ref 13.0–17.0)
Immature Granulocytes: 1 %
Lymphocytes Relative: 27 %
Lymphs Abs: 2.8 10*3/uL (ref 0.7–4.0)
MCH: 30 pg (ref 26.0–34.0)
MCHC: 33.4 g/dL (ref 30.0–36.0)
MCV: 89.9 fL (ref 80.0–100.0)
Monocytes Absolute: 0.7 10*3/uL (ref 0.1–1.0)
Monocytes Relative: 7 %
Neutro Abs: 6.5 10*3/uL (ref 1.7–7.7)
Neutrophils Relative %: 62 %
Platelets: 206 10*3/uL (ref 150–400)
RBC: 5.26 MIL/uL (ref 4.22–5.81)
RDW: 12.9 % (ref 11.5–15.5)
WBC: 10.2 10*3/uL (ref 4.0–10.5)
nRBC: 0 % (ref 0.0–0.2)

## 2022-09-06 LAB — COMPREHENSIVE METABOLIC PANEL
ALT: 20 U/L (ref 0–44)
AST: 16 U/L (ref 15–41)
Albumin: 4.5 g/dL (ref 3.5–5.0)
Alkaline Phosphatase: 42 U/L (ref 38–126)
Anion gap: 9 (ref 5–15)
BUN: 17 mg/dL (ref 6–20)
CO2: 23 mmol/L (ref 22–32)
Calcium: 9 mg/dL (ref 8.9–10.3)
Chloride: 106 mmol/L (ref 98–111)
Creatinine, Ser: 1.01 mg/dL (ref 0.61–1.24)
GFR, Estimated: >60 mL/min (ref >60)
Glucose, Bld: 98 mg/dL (ref 70–99)
Potassium: 3.8 mmol/L (ref 3.5–5.1)
Sodium: 138 mmol/L (ref 135–145)
Total Bilirubin: 0.5 mg/dL (ref 0.3–1.2)
Total Protein: 6.3 g/dL — ABNORMAL LOW (ref 6.5–8.1)

## 2022-09-06 LAB — TROPONIN I (HIGH SENSITIVITY): Troponin I (High Sensitivity): 12 ng/L (ref <18)

## 2022-09-06 NOTE — ED Provider Notes (Signed)
[facility]ENC[facility] DEPARTMENT AT [facility] Provider Note   CSN: [id number] Arrival date & time: 09/06/22  2026     History  Chief Complaint  Patient presents with   Hypertension    Alexander [name] is a 47 y.o. male.  Patient is a 47 year old male with a past medical history of CAD, CHF, A-fib on Xarelto presenting to the emergency department with high blood pressure.  The patient states that over the weekend he went to [facility] and was walking much more than usual.  He states that then over the last few days he started to feel off and a little bit lightheaded.  He states he has had some mild intermittent chest pressures.  He states that he has been measuring his blood pressure at home and has been running between the 150s to 180s systolic.  He states he was previously in the 120s.  He denies any numbness or weakness.  He states he has been taking his medications as prescribed.  The history is provided by the patient and the spouse.  Hypertension       Home Medications Prior to Admission medications   Medication Sig Start Date End Date Taking? Authorizing Provider  acetaminophen (TYLENOL) 500 MG tablet Take 500 mg by mouth every 6 (six) hours as needed for moderate pain.    [provider]  allopurinol (ZYLOPRIM) 100 MG tablet Take 100 mg by mouth daily. 02/13/20   [provider]  amiodarone (PACERONE) 200 MG tablet Take 0.5 tablets (100 mg total) by mouth daily. 08/05/22   Jacklynn Ganong, FNP  buPROPion (WELLBUTRIN XL) 300 MG 24 hr tablet Take 300 mg by mouth daily. 02/13/20   [provider]  carvedilol (COREG) 6.25 MG tablet TAKE 1 TABLET BY MOUTH 2 TIMES DAILY WITH A MEAL. 11/20/20   Bensimhon, Bevelyn Buckles, MD  clonazePAM (KLONOPIN) 0.5 MG tablet Take 0.5 mg by mouth 2 (two) times daily as needed for anxiety. 11/27/19   [provider]  clopidogrel (PLAVIX) 75 MG tablet Take 1 tablet (75 mg total) by mouth daily. 07/14/22   Azalee Course, PA   colchicine 0.6 MG tablet Take 0.6 mg by mouth 2 (two) times daily as needed (gout). 02/28/20   [provider]  dapagliflozin propanediol (FARXIGA) 10 MG TABS tablet TAKE 1 TABLET BY MOUTH EVERY DAY 08/08/22   Quintella Reichert, MD  doxazosin (CARDURA) 1 MG tablet TAKE 1 TABLET BY MOUTH EVERYDAY AT BEDTIME 07/21/22   Bensimhon, Bevelyn Buckles, MD  ENTRESTO 97-103 MG TAKE 1 TABLET BY MOUTH TWICE A DAY 08/30/22   Quintella Reichert, MD  fenofibrate (TRICOR) 145 MG tablet Take 145 mg by mouth daily. 02/13/20   [provider]  furosemide (LASIX) 20 MG tablet TAKE 1 TABLET BY MOUTH AS NEEDED FOR FLUID OR EDEMA 06/06/22   Quintella Reichert, MD  isosorbide mononitrate (IMDUR) 30 MG 24 hr tablet Take 1 tablet (30 mg total) by mouth daily. 07/15/22   Azalee Course, PA  Multiple Vitamin (MULTIVITAMIN WITH MINERALS) TABS tablet Take 1 tablet by mouth daily.    [provider]  nitroGLYCERIN (NITROSTAT) 0.4 MG SL tablet Place 1 tablet (0.4 mg total) under the tongue every 5 (five) minutes as needed for chest pain. 07/14/22 07/14/23  Azalee Course, PA  potassium chloride SA (KLOR-CON) 20 MEQ tablet Take 1 tablet (20 mEq total) by mouth as needed (with Lasix). 05/26/20   Robbie Lis M, PA-C  rosuvastatin (CRESTOR) 40 MG tablet  TAKE 1 TABLET BY MOUTH EVERY DAY 08/29/22   Chandrasekhar, Rondel Jumbo, MD  Semaglutide-Weight Management (WEGOVY) 1 MG/0.5ML SOAJ Inject 1 mg into the skin once a week. Patient not taking: Reported on 08/16/2022 12/24/21   Bensimhon, Bevelyn Buckles, MD  spironolactone (ALDACTONE) 25 MG tablet TAKE 1 TABLET (25 MG TOTAL) BY MOUTH DAILY. 05/16/22   Allayne Butcher, PA-C  traMADol (ULTRAM) 50 MG tablet Take 50 mg by mouth 3 (three) times daily as needed for moderate pain. 02/13/20   [provider]  XARELTO 20 MG TABS tablet TAKE 1 TABLET BY MOUTH EVERY DAY 07/28/22   Bensimhon, Bevelyn Buckles, MD  digoxin (LANOXIN) 0.125 MG tablet Take 1 tablet (0.125 mg total) by mouth daily. 06/15/20 08/03/20   Allayne Butcher, PA-C      Allergies    Albuterol, Bee venom, Metaproterenol, Other, Amoxicillin-pot clavulanate, Cephalexin, and Amoxicillin    Review of Systems   Review of Systems  Physical Exam Updated Vital Signs BP 124/77   Pulse 64   Temp 97.6 F (36.4 C)   Resp 18   SpO2 98%  Physical Exam Vitals and nursing note reviewed.  Constitutional:      General: He is not in acute distress.    Appearance: Normal appearance. He is obese.  HENT:     Head: Normocephalic and atraumatic.     Nose: Nose normal.     Mouth/Throat:     Mouth: Mucous membranes are moist.     Pharynx: Oropharynx is clear.  Eyes:     Extraocular Movements: Extraocular movements intact.     Conjunctiva/sclera: Conjunctivae normal.     Pupils: Pupils are equal, round, and reactive to light.  Cardiovascular:     Rate and Rhythm: Normal rate and regular rhythm.     Pulses: Normal pulses.     Heart sounds: Normal heart sounds.  Pulmonary:     Effort: Pulmonary effort is normal.     Breath sounds: Normal breath sounds.  Abdominal:     General: Abdomen is flat.     Palpations: Abdomen is soft.     Tenderness: There is no abdominal tenderness.  Musculoskeletal:        General: Normal range of motion.     Cervical back: Normal range of motion and neck supple.  Skin:    General: Skin is warm and dry.  Neurological:     General: No focal deficit present.     Mental Status: He is alert and oriented to person, place, and time.     Cranial Nerves: No cranial nerve deficit.     Sensory: No sensory deficit.     Motor: No weakness.     Coordination: Coordination normal.  Psychiatric:        Mood and Affect: Mood normal.        Behavior: Behavior normal.     ED Results / Procedures / Treatments   Labs (all labs ordered are listed, but only abnormal results are displayed) Labs Reviewed  COMPREHENSIVE METABOLIC PANEL - Abnormal; Notable for the following components:      Result Value   Total  Protein 6.3 (*)    All other components within normal limits  CBC WITH DIFFERENTIAL/PLATELET  TROPONIN I (HIGH SENSITIVITY)    EKG EKG Interpretation  Date/Time:  Tuesday September 06 2022 20:37:25 EDT Ventricular Rate:  66 PR Interval:  180 QRS Duration: 90 QT Interval:  416 QTC Calculation: 436 R Axis:   86 Text Interpretation:  Normal sinus rhythm Possible Inferior infarct (cited on or before 13-Jul-2022) Possible Anterior infarct (cited on or before 13-Jul-2022) Abnormal ECG When compared with ECG of 05-Aug-2022 12:29, Questionable change in QRS axis Confirmed by Elayne Snare ([id number]) on 09/06/2022 9:25:41 PM  Radiology DG Chest 2 View  Result Date: 09/06/2022 CLINICAL DATA:  Chest pain pain. EXAM: CHEST - 2 VIEW COMPARISON:  AP chest 07/12/2022 06/03/2021 FINDINGS: Cardiac silhouette is again at the upper limits of normal size. Mediastinal contours are within normal limits. The lungs are clear. No pleural effusion or pneumothorax. IMPRESSION: No active cardiopulmonary disease. Electronically Signed   By: Neita Garnet M.D.   On: 09/06/2022 21:12    Procedures Procedures    Medications Ordered in ED Medications - No data to display  ED Course/ Medical Decision Making/ A&P Clinical Course as of 09/06/22 2335  Tue Sep 06, 2022  2324 Upon reassessment blood pressure is improved to the 120 systolic.  Labs are within normal range.  He is stable for discharge home with primary care follow-up and was given strict return precautions. [VK]    Clinical Course User Index [VK] Rexford Maus, DO                             Medical Decision Making This patient presents to the ED with chief complaint(s) of hypertension, dizziness with pertinent past medical history of CAD, CHF, A-fib which further complicates the presenting complaint. The complaint involves an extensive differential diagnosis and also carries with it a high risk of complications and morbidity.    The differential  diagnosis includes ACS, arrhythmia, anemia, electrolyte abnormality, hypertensive urgency, hypertensive emergency, no focal neurologic deficits making CVA unlikely  Additional history obtained: Additional history obtained from spouse Records reviewed previous admission documents  ED Course and Reassessment: On patient's arrival to the emergency department he was initially mildly hypertensive to the 150s systolic otherwise well-appearing in no acute distress.  EKG on arrival showed normal sinus rhythm without acute ischemic changes.  Due to patient's associated dizziness and intermittent chest pressures he will have labs including troponin and chest x-ray performed and will be closely reassessed.  Independent labs interpretation:  The following labs were independently interpreted: Within normal range  Independent visualization of imaging: - I independently visualized the following imaging with scope of interpretation limited to determining acute life threatening conditions related to emergency care: Chest x-ray, which revealed no acute disease  Consultation: - Consulted or discussed management/test interpretation w/ external professional: N/A  Consideration for admission or further workup: Patient has no emergent conditions requiring admission or further work-up at this time and is stable for discharge home with primary care follow-up  Social Determinants of health: N/A    Amount and/or Complexity of Data Reviewed Labs: ordered. Radiology: ordered.          Final Clinical Impression(s) / ED Diagnoses Final diagnoses:  Hypertension, unspecified type    Rx / DC Orders ED Discharge Orders     None         Rexford Maus, DO 09/06/22 2335

## 2022-09-06 NOTE — ED Triage Notes (Signed)
Pt c/o "a few days- maybe 4-" of perceived HTN w associated "off-feeling." States that he was recently at [employer], "doing a lot of walking." Reports compliance w home medications- in cardiac rehab for recent stent, "no BP issues at all." Reports recent cortisone shot last Monday, "not sure if that's related."  Hx afib, CHF

## 2022-09-06 NOTE — Discharge Instructions (Signed)
You were seen in the emergency department for your high blood pressure.  Your workup showed no signs of stress on your heart, kidney dysfunction or signs of stroke from your blood pressure being high.  Your blood pressure improved back to normal on its own while you are in the emergency department.  Your high blood pressure could be related to your recent diet change on your vacation and you should continue to monitor it at home and keep a log to follow-up with your primary doctor.  You should check your blood pressure once daily at the same time every day after resting for at least 15 minutes.  Patient return to the emergency department if you are having severe chest pain, numbness or weakness on one side of the body compared to the other, you pass out or if you have any other new or concerning symptoms.

## 2022-09-07 ENCOUNTER — Encounter (HOSPITAL_COMMUNITY)
Admission: RE | Admit: 2022-09-07 | Discharge: 2022-09-07 | Disposition: A | Discharge status: Home / Self Care | Payer: BC Managed Care – PPO | Source: Ambulatory Visit | Attending: Internal Medicine | Admitting: Internal Medicine

## 2022-09-07 DIAGNOSIS — I5041 Acute combined systolic (congestive) and diastolic (congestive) heart failure: Secondary | ICD-10-CM | POA: Insufficient documentation

## 2022-09-07 DIAGNOSIS — I5042 Chronic combined systolic (congestive) and diastolic (congestive) heart failure: Secondary | ICD-10-CM | POA: Insufficient documentation

## 2022-09-07 DIAGNOSIS — Z955 Presence of coronary angioplasty implant and graft: Secondary | ICD-10-CM | POA: Insufficient documentation

## 2022-09-07 NOTE — Progress Notes (Addendum)
Patient reported that he went to the ED yesterday after returning from a trip to [facility] yesterday. Reported feeling a little lightheaded getting out of the car. Denies symptoms presently. Sitting BP 128/80. Standing blood pressure 122/92 heart rate 63. Oxygen saturation 95% on RA. I asked the patient to bring his BP cuff for comparison on his next scheduled visit. I advised Corrie not to exercise today as he says he has a blister on the back of his foot. I did consult with onsite provider Robin Searing NP via instant message. Sounds like it may have been a isolated episode caused by prolonged sitting and his beta-blocker. I think if he is not experiencing any additional episodes or symptoms he should be fine to proceed.  Patient notified and aware he plans to return to exercise on Friday.  Thayer Headings RN BSN

## 2022-09-09 ENCOUNTER — Encounter (HOSPITAL_COMMUNITY)
Admission: RE | Admit: 2022-09-09 | Discharge: 2022-09-09 | Disposition: A | Discharge status: Home / Self Care | Payer: BC Managed Care – PPO | Source: Ambulatory Visit | Attending: Internal Medicine | Admitting: Internal Medicine

## 2022-09-09 DIAGNOSIS — Z955 Presence of coronary angioplasty implant and graft: Secondary | ICD-10-CM

## 2022-09-09 DIAGNOSIS — I5041 Acute combined systolic (congestive) and diastolic (congestive) heart failure: Secondary | ICD-10-CM | POA: Diagnosis not present

## 2022-09-09 DIAGNOSIS — I5042 Chronic combined systolic (congestive) and diastolic (congestive) heart failure: Secondary | ICD-10-CM

## 2022-09-12 ENCOUNTER — Encounter (HOSPITAL_COMMUNITY)
Admission: RE | Admit: 2022-09-12 | Discharge: 2022-09-12 | Disposition: A | Discharge status: Home / Self Care | Payer: BC Managed Care – PPO | Source: Ambulatory Visit | Attending: Internal Medicine | Admitting: Internal Medicine

## 2022-09-12 DIAGNOSIS — Z955 Presence of coronary angioplasty implant and graft: Secondary | ICD-10-CM | POA: Diagnosis not present

## 2022-09-12 DIAGNOSIS — I5041 Acute combined systolic (congestive) and diastolic (congestive) heart failure: Secondary | ICD-10-CM | POA: Diagnosis not present

## 2022-09-12 DIAGNOSIS — I5042 Chronic combined systolic (congestive) and diastolic (congestive) heart failure: Secondary | ICD-10-CM | POA: Diagnosis not present

## 2022-09-13 ENCOUNTER — Telehealth (HOSPITAL_COMMUNITY): Payer: Self-pay

## 2022-09-13 NOTE — Telephone Encounter (Signed)
I spoke to patient and updated on medications. We set up nurse visit for the morning to do an EKG and REDS clip. He will go to ER if pain worsens or symptoms worsen.

## 2022-09-13 NOTE — Telephone Encounter (Signed)
Patient called and stated he has some dizziness and BP is elevated at 150/90 and he is having some dull pain on left side of chest. No other symptoms noted. I told him I would check with you, but he may be advised to head to ED.

## 2022-09-13 NOTE — Progress Notes (Signed)
Cardiac Individual Treatment Plan  Patient Details  Name: Alexander [name] MRN: [id number] Date of Birth: 1976/03/04 Referring Provider:   Flowsheet Row INTENSIVE CARDIAC REHAB ORIENT from 08/16/2022 in [facility]  Referring Provider Arvilla Meres, MD       Initial Encounter Date:  Flowsheet Row INTENSIVE CARDIAC REHAB ORIENT from 08/16/2022 in [facility], [facility]  Date 08/16/22       Visit Diagnosis: 07/13/22 S/P DES Prox/Mid LAD  Heart failure, systolic and diastolic, chronic (HCC)  Acute combined systolic and diastolic HF (heart failure) (HCC)  Patient's Home Medications on Admission:  Current Outpatient Medications:    acetaminophen (TYLENOL) 500 MG tablet, Take 500 mg by mouth every 6 (six) hours as needed for moderate pain., Disp: , Rfl:    allopurinol (ZYLOPRIM) 100 MG tablet, Take 100 mg by mouth daily., Disp: , Rfl:    amiodarone (PACERONE) 200 MG tablet, Take 0.5 tablets (100 mg total) by mouth daily., Disp: 45 tablet, Rfl: 3   buPROPion (WELLBUTRIN XL) 300 MG 24 hr tablet, Take 300 mg by mouth daily., Disp: , Rfl:    carvedilol (COREG) 6.25 MG tablet, TAKE 1 TABLET BY MOUTH 2 TIMES DAILY WITH A MEAL., Disp: 180 tablet, Rfl: 3   clonazePAM (KLONOPIN) 0.5 MG tablet, Take 0.5 mg by mouth 2 (two) times daily as needed for anxiety., Disp: , Rfl:    clopidogrel (PLAVIX) 75 MG tablet, Take 1 tablet (75 mg total) by mouth daily., Disp: 90 tablet, Rfl: 3   colchicine 0.6 MG tablet, Take 0.6 mg by mouth 2 (two) times daily as needed (gout)., Disp: , Rfl:    dapagliflozin propanediol (FARXIGA) 10 MG TABS tablet, TAKE 1 TABLET BY MOUTH EVERY DAY, Disp: 30 tablet, Rfl: 0   doxazosin (CARDURA) 2 MG tablet, Take 1 tablet (2 mg total) by mouth at bedtime., Disp: 30 tablet, Rfl: 11   ENTRESTO 97-103 MG, TAKE 1 TABLET BY MOUTH TWICE A DAY, Disp: 60 tablet, Rfl: 1   fenofibrate  (TRICOR) 145 MG tablet, Take 145 mg by mouth daily., Disp: , Rfl:    furosemide (LASIX) 20 MG tablet, TAKE 1 TABLET BY MOUTH AS NEEDED FOR FLUID OR EDEMA, Disp: 30 tablet, Rfl: 5   isosorbide mononitrate (IMDUR) 30 MG 24 hr tablet, Take 1.5 tablets (45 mg total) by mouth daily., Disp: 105 tablet, Rfl: 11   Multiple Vitamin (MULTIVITAMIN WITH MINERALS) TABS tablet, Take 1 tablet by mouth daily., Disp: , Rfl:    nitroGLYCERIN (NITROSTAT) 0.4 MG SL tablet, Place 1 tablet (0.4 mg total) under the tongue every 5 (five) minutes as needed for chest pain., Disp: 25 tablet, Rfl: 3   potassium chloride SA (KLOR-CON) 20 MEQ tablet, Take 1 tablet (20 mEq total) by mouth as needed (with Lasix)., Disp: 90 tablet, Rfl: 3   rosuvastatin (CRESTOR) 40 MG tablet, TAKE 1 TABLET BY MOUTH EVERY DAY, Disp: 30 tablet, Rfl: 0   Semaglutide-Weight Management (WEGOVY) 1 MG/0.5ML SOAJ, Inject 1 mg into the skin once a week. (Patient not taking: Reported on 09/14/2022), Disp: 2 mL, Rfl: 11   spironolactone (ALDACTONE) 25 MG tablet, TAKE 1 TABLET (25 MG TOTAL) BY MOUTH DAILY., Disp: 30 tablet, Rfl: 6   traMADol (ULTRAM) 50 MG tablet, Take 50 mg by mouth 3 (three) times daily as needed for moderate pain., Disp: , Rfl:    XARELTO 20 MG TABS tablet, TAKE 1 TABLET  BY MOUTH EVERY DAY, Disp: 30 tablet, Rfl: 5  Past Medical History: Past Medical History:  Diagnosis Date   Arrhythmia    Atrial fibrillation (HCC)    CAD (coronary artery disease) 08/17/2020   PCI + DES to pLCx, 70% mLAD negative FFR testing (medical therapy)    CHF (congestive heart failure) (HCC)    Chronic systolic heart failure (HCC)    Gout    Hyperlipidemia    Hypertension    OSA (obstructive sleep apnea) 09/2020    Tobacco Use: Social History   Tobacco Use  Smoking Status Former   Types: Cigars   Quit date: 08/14/2020   Years since quitting: 2.0  Smokeless Tobacco Never    Labs: Review Flowsheet  More data exists      Latest Ref Rng & Units  05/18/2020 08/17/2020 06/24/2021 03/22/2022 07/13/2022  Labs for ITP Cardiac and Pulmonary Rehab  Cholestrol 0 - 200 mg/dL - - 454  96  87   LDL (calc) 0 - 99 mg/dL - - 60  37  12   HDL-C >40 mg/dL - - 34  32  28   Trlycerides <150 mg/dL - - 098  119  147   Hemoglobin A1c 4.8 - 5.6 % - - 6.0  5.5  5.6   PH, Arterial 7.350 - 7.450 - 7.400  - - -  PCO2 arterial 32.0 - 48.0 mmHg - 38.4  - - -  Bicarbonate 20.0 - 28.0 mmol/L - 23.8  - - -  TCO2 22 - 32 mmol/L - 25  - - -  Acid-base deficit 0.0 - 2.0 mmol/L - 1.0  - - -  O2 Saturation % 59.6  99.0  - - -    Capillary Blood Glucose: No results found for: "GLUCAP"   Exercise Target Goals: Exercise Program Goal: Individual exercise prescription set using results from initial 6 min walk test and THRR while considering  patient's activity barriers and safety.   Exercise Prescription Goal: Initial exercise prescription builds to 30-45 minutes a day of aerobic activity, 2-3 days per week.  Home exercise guidelines will be given to patient during program as part of exercise prescription that the participant will acknowledge.  Activity Barriers & Risk Stratification:  Activity Barriers & Cardiac Risk Stratification - 08/16/22 0953       Activity Barriers & Cardiac Risk Stratification   Activity Barriers Arthritis;Joint Problems    Cardiac Risk Stratification High             6 Minute Walk:  6 Minute Walk     Row Name 08/16/22 0908         6 Minute Walk   Phase Initial     Distance 1726 feet     Walk Time 6 minutes     # of Rest Breaks 0     MPH 3.3     METS 4     RPE 9     Perceived Dyspnea  0     VO2 Peak 14     Symptoms Yes (comment)     Comments Low back tightness, no pain     Resting HR 64 bpm     Resting BP 124/86     Resting Oxygen Saturation  95 %     Exercise Oxygen Saturation  during 6 min walk 95 %     Max Ex. HR 87 bpm     Max Ex. BP 128/84     2 Minute Post BP 110/80  Oxygen Initial  Assessment:   Oxygen Re-Evaluation:   Oxygen Discharge (Final Oxygen Re-Evaluation):   Initial Exercise Prescription:  Initial Exercise Prescription - 08/16/22 0900       Date of Initial Exercise RX and Referring Provider   Date 08/16/22    Referring Provider Arvilla Meres, MD    Expected Discharge Date 10/28/22      Recumbant Bike   Level 2.5    RPM 60    Watts 50    Minutes 15    METs 4      Recumbant Elliptical   Level 3    RPM 60    Watts 25    Minutes 15    METs 4      Prescription Details   Frequency (times per week) 3    Duration Progress to 30 minutes of continuous aerobic without signs/symptoms of physical distress      Intensity   THRR 40-80% of Max Heartrate 70 - 139    Ratings of Perceived Exertion 11-13    Perceived Dyspnea 0-4      Progression   Progression Continue progressive overload as per policy without signs/symptoms or physical distress.      Resistance Training   Training Prescription Yes    Weight 5 lbs    Reps 10-15             Perform Capillary Blood Glucose checks as needed.  Exercise Prescription Changes:   Exercise Prescription Changes     Row Name 08/22/22 1700 09/09/22 1400           Response to Exercise   Blood Pressure (Admit) 124/70 138/86      Blood Pressure (Exercise) 140/70 138/80      Blood Pressure (Exit) 120/80 122/80      Heart Rate (Admit) 73 bpm 72 bpm      Heart Rate (Exercise) 93 bpm 96 bpm      Heart Rate (Exit) 81 bpm 81 bpm      Rating of Perceived Exertion (Exercise) 11 11      Symptoms None None      Comments Pt's first day in the CRP2 program Reviewed METs      Duration Progress to 30 minutes of  aerobic without signs/symptoms of physical distress Continue with 30 min of aerobic exercise without signs/symptoms of physical distress.      Intensity THRR unchanged THRR unchanged        Progression   Progression Continue to progress workloads to maintain intensity without signs/symptoms of  physical distress. Continue to progress workloads to maintain intensity without signs/symptoms of physical distress.      Average METs 2.6 2.3        Resistance Training   Training Prescription Yes Yes      Weight 5 lbs 5 lbs      Reps 10-15 10-15      Time 10 Minutes 10 Minutes        Interval Training   Interval Training No No        Recumbant Bike   Level 2.5 2.5      RPM 77 --      Watts 47 --      Minutes 15 15      METs 3.2 2.4        Recumbant Elliptical   Level 3 3      RPM 70 60      Watts 90 86      Minutes 15  15      METs 2 2.1               Exercise Comments:   Exercise Comments     Row Name 08/22/22 1716           Exercise Comments Pt's first day in the CRP2 program. Pt had no complaints with todays session and is off to a good start.                Exercise Goals and Review:   Exercise Goals     Row Name 08/16/22 0810             Exercise Goals   Increase Physical Activity Yes       Intervention Provide advice, education, support and counseling about physical activity/exercise needs.;Develop an individualized exercise prescription for aerobic and resistive training based on initial evaluation findings, risk stratification, comorbidities and participant's personal goals.       Expected Outcomes Short Term: Attend rehab on a regular basis to increase amount of physical activity.;Long Term: Add in home exercise to make exercise part of routine and to increase amount of physical activity.;Long Term: Exercising regularly at least 3-5 days a week.       Increase Strength and Stamina Yes       Intervention Provide advice, education, support and counseling about physical activity/exercise needs.;Develop an individualized exercise prescription for aerobic and resistive training based on initial evaluation findings, risk stratification, comorbidities and participant's personal goals.       Expected Outcomes Short Term: Increase workloads from  initial exercise prescription for resistance, speed, and METs.;Short Term: Perform resistance training exercises routinely during rehab and add in resistance training at home;Long Term: Improve cardiorespiratory fitness, muscular endurance and strength as measured by increased METs and functional capacity ( )       Able to understand and use rate of perceived exertion (RPE) scale Yes       Intervention Provide education and explanation on how to use RPE scale       Expected Outcomes Short Term: Able to use RPE daily in rehab to express subjective intensity level;Long Term:  Able to use RPE to guide intensity level when exercising independently       Knowledge and understanding of Target Heart Rate Range (THRR) Yes       Intervention Provide education and explanation of THRR including how the numbers were predicted and where they are located for reference       Expected Outcomes Short Term: Able to state/look up THRR;Long Term: Able to use THRR to govern intensity when exercising independently;Short Term: Able to use daily as guideline for intensity in rehab       Understanding of Exercise Prescription Yes       Intervention Provide education, explanation, and written materials on patient's individual exercise prescription       Expected Outcomes Short Term: Able to explain program exercise prescription;Long Term: Able to explain home exercise prescription to exercise independently                Exercise Goals Re-Evaluation :  Exercise Goals Re-Evaluation     Row Name 08/22/22 1715             Exercise Goal Re-Evaluation   Exercise Goals Review Increase Physical Activity;Increase Strength and Stamina;Able to understand and use rate of perceived exertion (RPE) scale;Knowledge and understanding of Target Heart Rate Range (THRR);Understanding of Exercise Prescription       Comments  Pt's first day in the CRP2 program. Pt understands the exercise Rx, RPE scale and THRR.       Expected  Outcomes Will continue to monitor patient and progress exercise workloads as tolerated.                Discharge Exercise Prescription (Final Exercise Prescription Changes):  Exercise Prescription Changes - 09/09/22 1400       Response to Exercise   Blood Pressure (Admit) 138/86    Blood Pressure (Exercise) 138/80    Blood Pressure (Exit) 122/80    Heart Rate (Admit) 72 bpm    Heart Rate (Exercise) 96 bpm    Heart Rate (Exit) 81 bpm    Rating of Perceived Exertion (Exercise) 11    Symptoms None    Comments Reviewed METs    Duration Continue with 30 min of aerobic exercise without signs/symptoms of physical distress.    Intensity THRR unchanged      Progression   Progression Continue to progress workloads to maintain intensity without signs/symptoms of physical distress.    Average METs 2.3      Resistance Training   Training Prescription Yes    Weight 5 lbs    Reps 10-15    Time 10 Minutes      Interval Training   Interval Training No      Recumbant Bike   Level 2.5    Minutes 15    METs 2.4      Recumbant Elliptical   Level 3    RPM 60    Watts 86    Minutes 15    METs 2.1             Nutrition:  Target Goals: Understanding of nutrition guidelines, daily intake of sodium 1500mg , cholesterol 200mg , calories 30% from fat and 7% or less from saturated fats, daily to have 5 or more servings of fruits and vegetables.  Biometrics:  Pre Biometrics - 08/16/22 0815       Pre Biometrics   Waist Circumference 54 inches    Hip Circumference 49.5 inches    Waist to Hip Ratio 1.09 %    Triceps Skinfold 15 mm    % Body Fat 37.5 %    Grip Strength 54 kg    Flexibility 0 in   Cannot reach   Single Leg Stand 22.68 seconds              Nutrition Therapy Plan and Nutrition Goals:  Nutrition Therapy & Goals - 08/25/22 0911       Nutrition Therapy   Diet Heart healthy diet    Drug/Food Interactions Statins/Certain Fruits      Personal Nutrition  Goals   Nutrition Goal Patient to identify strategies for reducing cardiovascular risk by attending the [facility]ritikin education and nutrition series weekly.    Personal Goal #2 Patient to improve diet quality by using the plate method as a guide for meal planning to include lean protein/plant protein, fruits, vegetables, whole grains, nonfat dairy as part of a well-balanced diet.    Personal Goal #3 Patient to identify strategies for weight loss of 0.5-2.0# per week.    Comments Bretton is taking Wegovy to aid with weight loss. He has previously completed cardiac rehab in the past. He will benefit from participation in intensive cardiac rehab for nutrition, exercise, and lifestyle modification.      Intervention Plan   Intervention Prescribe, educate and counsel regarding individualized specific dietary modifications aiming towards targeted core  components such as weight, hypertension, lipid management, diabetes, heart failure and other comorbidities.;Nutrition handout(s) given to patient.    Expected Outcomes Short Term Goal: Understand basic principles of dietary content, such as calories, fat, sodium, cholesterol and nutrients.;Long Term Goal: Adherence to prescribed nutrition plan.             Nutrition Assessments:  Nutrition Assessments - 08/16/22 1152       Rate Your Plate Scores   Pre Score 55            MEDIFICTS Score Key: ?70 Need to make dietary changes  40-70 Heart Healthy Diet ? 40 Therapeutic Level Cholesterol Diet   Flowsheet Row INTENSIVE CARDIAC REHAB ORIENT from 08/16/2022 in [facility] for Heart, Vascular, & Lung Health  Picture Your Plate Total Score on Admission 55      Picture Your Plate Scores: <65 Unhealthy dietary pattern with much room for improvement. 41-50 Dietary pattern unlikely to meet recommendations for good health and room for improvement. 51-60 More healthful dietary pattern, with some room for improvement.  >60 Healthy  dietary pattern, although there may be some specific behaviors that could be improved.    Nutrition Goals Re-Evaluation:  Nutrition Goals Re-Evaluation     Row Name 08/25/22 0911             Goals   Current Weight 310 lb 13.6 oz (141 kg)       Comment triglycerides 237, HDL 28, VLDL 47, A1c WNL       Expected Outcome Joen is taking Wegovy to aid with weight loss. He has previously completed cardiac rehab in the past. He will benefit from participation in intensive cardiac rehab for nutrition, exercise, and lifestyle modification.                Nutrition Goals Re-Evaluation:  Nutrition Goals Re-Evaluation     Row Name 08/25/22 0911             Goals   Current Weight 310 lb 13.6 oz (141 kg)       Comment triglycerides 237, HDL 28, VLDL 47, A1c WNL       Expected Outcome Sohrab is taking Wegovy to aid with weight loss. He has previously completed cardiac rehab in the past. He will benefit from participation in intensive cardiac rehab for nutrition, exercise, and lifestyle modification.                Nutrition Goals Discharge (Final Nutrition Goals Re-Evaluation):  Nutrition Goals Re-Evaluation - 08/25/22 0911       Goals   Current Weight 310 lb 13.6 oz (141 kg)    Comment triglycerides 237, HDL 28, VLDL 47, A1c WNL    Expected Outcome Davied is taking Wegovy to aid with weight loss. He has previously completed cardiac rehab in the past. He will benefit from participation in intensive cardiac rehab for nutrition, exercise, and lifestyle modification.             Psychosocial: Target Goals: Acknowledge presence or absence of significant depression and/or stress, maximize coping skills, provide positive support system. Participant is able to verbalize types and ability to use techniques and skills needed for reducing stress and depression.  Initial Review & Psychosocial Screening:  Initial Psych Review & Screening - 08/16/22 0904       Initial Review    Current issues with History of Depression      Family Dynamics   Good Support System? Yes  Has fiance and 2 teenage children for support.     Barriers   Psychosocial barriers to participate in program There are no identifiable barriers or psychosocial needs.      Screening Interventions   Interventions Encouraged to exercise    Expected Outcomes Long Term Goal: Stressors or current issues are controlled or eliminated.             Quality of Life Scores:  Quality of Life - 08/16/22 1001       Quality of Life   Select Quality of Life      Quality of Life Scores   Health/Function Pre 23.47 %    Socioeconomic Pre 23.86 %    Psych/Spiritual Pre 18.86 %    Family Pre 28.3 %    GLOBAL Pre 23.31 %            Scores of 19 and below usually indicate a poorer quality of life in these areas.  A difference of  2-3 points is a clinically meaningful difference.  A difference of 2-3 points in the total score of the Quality of Life Index has been associated with significant improvement in overall quality of life, self-image, physical symptoms, and general health in studies assessing change in quality of life.  PHQ-9: Review Flowsheet       08/16/2022 07/17/2020 02/24/2020  Depression screen PHQ 2/9  Decreased Interest 0 0 0  Down, Depressed, Hopeless 0 0 0  PHQ - 2 Score 0 0 0  Altered sleeping 0 - -  Tired, decreased energy 0 - -  Change in appetite 2 - -  Feeling bad or failure about yourself  0 - -  Trouble concentrating 1 - -  Moving slowly or fidgety/restless 0 - -  Suicidal thoughts 0 - -  PHQ-9 Score 3 - -  Difficult doing work/chores Not difficult at all - -   Interpretation of Total Score  Total Score Depression Severity:  1-4 = Minimal depression, 5-9 = Mild depression, 10-14 = Moderate depression, 15-19 = Moderately severe depression, 20-27 = Severe depression   Psychosocial Evaluation and Intervention:   Psychosocial Re-Evaluation:  Psychosocial  Re-Evaluation     Row Name 08/23/22 1543 09/13/22 1758           Psychosocial Re-Evaluation   Current issues with History of Depression History of Depression      Comments Tydrick did not voice any increased concerns or stressors on his first day of exercise at intensive cardiac rehab Geoff has not voiced  increased concerns or stressors during  exercise at intensive cardiac rehab      Interventions Encouraged to attend Cardiac Rehabilitation for the exercise Encouraged to attend Cardiac Rehabilitation for the exercise      Continue Psychosocial Services  No Follow up required No Follow up required               Psychosocial Discharge (Final Psychosocial Re-Evaluation):  Psychosocial Re-Evaluation - 09/13/22 1758       Psychosocial Re-Evaluation   Current issues with History of Depression    Comments Guilherme has not voiced  increased concerns or stressors during  exercise at intensive cardiac rehab    Interventions Encouraged to attend Cardiac Rehabilitation for the exercise    Continue Psychosocial Services  No Follow up required             Vocational Rehabilitation: Provide vocational rehab assistance to qualifying candidates.   Vocational Rehab Evaluation & Intervention:  Vocational Rehab -  08/16/22 0906       Initial Vocational Rehab Evaluation & Intervention   Assessment shows need for Vocational Rehabilitation No   Reeder works full time and does not need vocational rehab at this time.            Education: Education Goals: Education classes will be provided on a weekly basis, covering required topics. Participant will state understanding/return demonstration of topics presented.    Education     Row Name 08/22/22 1600     Education   Cardiac Education Topics Pritikin   Geographical information systems officer Psychosocial   Psychosocial Workshop Focused Goals, Sustainable Changes   Instruction Review Code 1-  Verbalizes Understanding   Class Start Time 1400   Class Stop Time 1441   Class Time Calculation (min) 41 min    Row Name 08/24/22 1500     Education   Cardiac Education Topics Pritikin   Customer service manager   Weekly Topic One-Pot Wonders   Instruction Review Code 1- Verbalizes Understanding   Class Start Time 1400   Class Stop Time 1440   Class Time Calculation (min) 40 min    Row Name 08/31/22 1600     Education   Cardiac Education Topics Pritikin   Customer service manager   Weekly Topic Comforting Weekend Breakfasts   Instruction Review Code 1- Verbalizes Understanding   Class Start Time 1355   Class Stop Time 1442   Class Time Calculation (min) 47 min    Row Name 09/09/22 1300     Education   Cardiac Education Topics Pritikin   Licensed conveyancer Nutrition   Nutrition Vitamins and Minerals   Instruction Review Code 1Trenton Gammon Understanding    Row Name 09/12/22 1700     Education   Cardiac Education Topics Pritikin   Select Workshops     Workshops   Educator Exercise Physiologist   Select Psychosocial   Psychosocial Workshop Healthy Sleep for a Healthy Heart   Instruction Review Code 1- Verbalizes Understanding   Class Start Time 1405   Class Stop Time 1502   Class Time Calculation (min) 57 min            Core Videos: Exercise    Move It!  Clinical staff conducted group or individual video education with verbal and written material and guidebook.  Patient learns the recommended Pritikin exercise program. Exercise with the goal of living a long, healthy life. Some of the health benefits of exercise include controlled diabetes, healthier blood pressure levels, improved cholesterol levels, improved heart and lung capacity, improved sleep, and better body composition. Everyone should speak with their doctor before  starting or changing an exercise routine.  Biomechanical Limitations Clinical staff conducted group or individual video education with verbal and written material and guidebook.  Patient learns how biomechanical limitations can impact exercise and how we can mitigate and possibly overcome limitations to have an impactful and balanced exercise routine.  Body Composition Clinical staff conducted group or individual video education with verbal and written material and guidebook.  Patient learns that body composition (ratio of muscle mass to fat mass) is a key component to assessing overall fitness, rather than body weight alone. Increased fat mass, especially visceral belly fat,  can put [place] at increased risk for metabolic syndrome, type 2 diabetes, heart disease, and even death. It is recommended to combine diet and exercise (cardiovascular and resistance training) to improve your body composition. Seek guidance from your physician and exercise physiologist before implementing an exercise routine.  Exercise Action Plan Clinical staff conducted group or individual video education with verbal and written material and guidebook.  Patient learns the recommended strategies to achieve and enjoy long-term exercise adherence, including variety, self-motivation, self-efficacy, and positive decision making. Benefits of exercise include fitness, good health, weight management, more energy, better sleep, less stress, and overall well-being.  Medical   Heart Disease Risk Reduction Clinical staff conducted group or individual video education with verbal and written material and guidebook.  Patient learns our heart is our most vital organ as it circulates oxygen, nutrients, white blood cells, and hormones throughout the entire body, and carries waste away. Data supports a plant-based eating plan like the Pritikin Program for its effectiveness in slowing progression of and reversing heart disease. The video provides a  number of recommendations to address heart disease.   Metabolic Syndrome and Belly Fat  Clinical staff conducted group or individual video education with verbal and written material and guidebook.  Patient learns what metabolic syndrome is, how it leads to heart disease, and how one can reverse it and keep it from coming back. You have metabolic syndrome if you have 3 of the following 5 criteria: abdominal obesity, high blood pressure, high triglycerides, low HDL cholesterol, and high blood sugar.  Hypertension and Heart Disease Clinical staff conducted group or individual video education with verbal and written material and guidebook.  Patient learns that high blood pressure, or hypertension, is very common in the [place]. Hypertension is largely due to excessive salt intake, but other important risk factors include being overweight, physical inactivity, drinking too much alcohol, smoking, and not eating enough potassium from fruits and vegetables. High blood pressure is a leading risk factor for heart attack, stroke, congestive heart failure, dementia, kidney failure, and premature death. Long-term effects of excessive salt intake include stiffening of the arteries and thickening of heart muscle and organ damage. Recommendations include ways to reduce hypertension and the risk of heart disease.  Diseases of Our Time - Focusing on Diabetes Clinical staff conducted group or individual video education with verbal and written material and guidebook.  Patient learns why the best way to stop diseases of our time is prevention, through food and other lifestyle changes. Medicine (such as prescription pills and surgeries) is often only a Band-Aid on the problem, not a long-term solution. Most common diseases of our time include obesity, type 2 diabetes, hypertension, heart disease, and cancer. The Pritikin Program is recommended and has been proven to help reduce, reverse, and/or prevent the damaging  effects of metabolic syndrome.  Nutrition   Overview of the Pritikin Eating Plan  Clinical staff conducted group or individual video education with verbal and written material and guidebook.  Patient learns about the Pritikin Eating Plan for disease risk reduction. The Pritikin Eating Plan emphasizes a wide variety of unrefined, minimally-processed carbohydrates, like fruits, vegetables, whole grains, and legumes. Go, Caution, and Stop food choices are explained. Plant-based and lean animal proteins are emphasized. Rationale provided for low sodium intake for blood pressure control, low added sugars for blood sugar stabilization, and low added fats and oils for coronary artery disease risk reduction and weight management.  Calorie Density  Clinical staff conducted group or individual  video education with verbal and written material and guidebook.  Patient learns about calorie density and how it impacts the Pritikin Eating Plan. Knowing the characteristics of the food you choose will help you decide whether those foods will lead to weight gain or weight loss, and whether you want to consume more or less of them. Weight loss is usually a side effect of the Pritikin Eating Plan because of its focus on low calorie-dense foods.  Label Reading  Clinical staff conducted group or individual video education with verbal and written material and guidebook.  Patient learns about the Pritikin recommended label reading guidelines and corresponding recommendations regarding calorie density, added sugars, sodium content, and whole grains.  Dining Out - Part 1  Clinical staff conducted group or individual video education with verbal and written material and guidebook.  Patient learns that restaurant meals can be sabotaging because they can be so high in calories, fat, sodium, and/or sugar. Patient learns recommended strategies on how to positively address this and avoid unhealthy pitfalls.  Facts on Fats   Clinical staff conducted group or individual video education with verbal and written material and guidebook.  Patient learns that lifestyle modifications can be just as effective, if not more so, as many medications for lowering your risk of heart disease. A Pritikin lifestyle can help to reduce your risk of inflammation and atherosclerosis (cholesterol build-up, or plaque, in the artery walls). Lifestyle interventions such as dietary choices and physical activity address the cause of atherosclerosis. A review of the types of fats and their impact on blood cholesterol levels, along with dietary recommendations to reduce fat intake is also included.  Nutrition Action Plan  Clinical staff conducted group or individual video education with verbal and written material and guidebook.  Patient learns how to incorporate Pritikin recommendations into their lifestyle. Recommendations include planning and keeping personal health goals in mind as an important part of their success.  Healthy Mind-Set    Healthy Minds, Bodies, Hearts  Clinical staff conducted group or individual video education with verbal and written material and guidebook.  Patient learns how to identify when they are stressed. Video will discuss the impact of that stress, as well as the many benefits of stress management. Patient will also be introduced to stress management techniques. The way we think, act, and feel has an impact on our hearts.  How Our Thoughts Can Heal Our Hearts  Clinical staff conducted group or individual video education with verbal and written material and guidebook.  Patient learns that negative thoughts can cause depression and anxiety. This can result in negative lifestyle behavior and serious health problems. Cognitive behavioral therapy is an effective method to help control our thoughts in order to change and improve our emotional outlook.  Additional Videos:  Exercise    Improving Performance  Clinical  staff conducted group or individual video education with verbal and written material and guidebook.  Patient learns to use a non-linear approach by alternating intensity levels and lengths of time spent exercising to help burn more calories and lose more body fat. Cardiovascular exercise helps improve heart health, metabolism, hormonal balance, blood sugar control, and recovery from fatigue. Resistance training improves strength, endurance, balance, coordination, reaction time, metabolism, and muscle mass. Flexibility exercise improves circulation, posture, and balance. Seek guidance from your physician and exercise physiologist before implementing an exercise routine and learn your capabilities and proper form for all exercise.  Introduction to Yoga  Clinical staff conducted group or individual video education with  verbal and written material and guidebook.  Patient learns about yoga, a discipline of the coming together of mind, breath, and body. The benefits of yoga include improved flexibility, improved range of motion, better posture and core strength, increased lung function, weight loss, and positive self-image. Yoga's heart health benefits include lowered blood pressure, healthier heart rate, decreased cholesterol and triglyceride levels, improved immune function, and reduced stress. Seek guidance from your physician and exercise physiologist before implementing an exercise routine and learn your capabilities and proper form for all exercise.  Medical   Aging: Enhancing Your Quality of Life  Clinical staff conducted group or individual video education with verbal and written material and guidebook.  Patient learns key strategies and recommendations to stay in good physical health and enhance quality of life, such as prevention strategies, having an advocate, securing a Health Care Proxy and Power of Attorney, and keeping a list of medications and system for tracking them. It also discusses how to  avoid risk for bone loss.  Biology of Weight Control  Clinical staff conducted group or individual video education with verbal and written material and guidebook.  Patient learns that weight gain occurs because we consume more calories than we burn (eating more, moving less). Even if your body weight is normal, you may have higher ratios of fat compared to muscle mass. Too much body fat puts you at increased risk for cardiovascular disease, heart attack, stroke, type 2 diabetes, and obesity-related cancers. In addition to exercise, following the Pritikin Eating Plan can help reduce your risk.  Decoding Lab Results  Clinical staff conducted group or individual video education with verbal and written material and guidebook.  Patient learns that lab test reflects one measurement whose values change over time and are influenced by many factors, including medication, stress, sleep, exercise, food, hydration, pre-existing medical conditions, and more. It is recommended to use the knowledge from this video to become more involved with your lab results and evaluate your numbers to speak with your doctor.   Diseases of Our Time - Overview  Clinical staff conducted group or individual video education with verbal and written material and guidebook.  Patient learns that according to the CDC, 50% to 70% of chronic diseases (such as obesity, type 2 diabetes, elevated lipids, hypertension, and heart disease) are avoidable through lifestyle improvements including healthier food choices, listening to satiety cues, and increased physical activity.  Sleep Disorders Clinical staff conducted group or individual video education with verbal and written material and guidebook.  Patient learns how good quality and duration of sleep are important to overall health and well-being. Patient also learns about sleep disorders and how they impact health along with recommendations to address them, including discussing with a  physician.  Nutrition  Dining Out - Part 2 Clinical staff conducted group or individual video education with verbal and written material and guidebook.  Patient learns how to plan ahead and communicate in order to maximize their dining experience in a healthy and nutritious manner. Included are recommended food choices based on the type of restaurant the patient is visiting.   Fueling a Banker conducted group or individual video education with verbal and written material and guidebook.  There is a strong connection between our food choices and our health. Diseases like obesity and type 2 diabetes are very prevalent and are in large-part due to lifestyle choices. The Pritikin Eating Plan provides plenty of food and hunger-curbing satisfaction. It is easy to follow, affordable,  and helps reduce health risks.  Menu Workshop  Clinical staff conducted group or individual video education with verbal and written material and guidebook.  Patient learns that restaurant meals can sabotage health goals because they are often packed with calories, fat, sodium, and sugar. Recommendations include strategies to plan ahead and to communicate with the manager, chef, or server to help order a healthier meal.  Planning Your Eating Strategy  Clinical staff conducted group or individual video education with verbal and written material and guidebook.  Patient learns about the Pritikin Eating Plan and its benefit of reducing the risk of disease. The Pritikin Eating Plan does not focus on calories. Instead, it emphasizes high-quality, nutrient-rich foods. By knowing the characteristics of the foods, we choose, we can determine their calorie density and make informed decisions.  Targeting Your Nutrition Priorities  Clinical staff conducted group or individual video education with verbal and written material and guidebook.  Patient learns that lifestyle habits have a tremendous impact on disease  risk and progression. This video provides eating and physical activity recommendations based on your personal health goals, such as reducing LDL cholesterol, losing weight, preventing or controlling type 2 diabetes, and reducing high blood pressure.  Vitamins and Minerals  Clinical staff conducted group or individual video education with verbal and written material and guidebook.  Patient learns different ways to obtain key vitamins and minerals, including through a recommended healthy diet. It is important to discuss all supplements you take with your doctor.   Healthy Mind-Set    Smoking Cessation  Clinical staff conducted group or individual video education with verbal and written material and guidebook.  Patient learns that cigarette smoking and tobacco addiction pose a serious health risk which affects millions of people. Stopping smoking will significantly reduce the risk of heart disease, lung disease, and many forms of cancer. Recommended strategies for quitting are covered, including working with your doctor to develop a successful plan.  Culinary   Becoming a Set designer conducted group or individual video education with verbal and written material and guidebook.  Patient learns that cooking at home can be healthy, cost-effective, quick, and puts them in control. Keys to cooking healthy recipes will include looking at your recipe, assessing your equipment needs, planning ahead, making it simple, choosing cost-effective seasonal ingredients, and limiting the use of added fats, salts, and sugars.  Cooking - Breakfast and Snacks  Clinical staff conducted group or individual video education with verbal and written material and guidebook.  Patient learns how important breakfast is to satiety and nutrition through the entire day. Recommendations include key foods to eat during breakfast to help stabilize blood sugar levels and to prevent overeating at meals later in the day.  Planning ahead is also a key component.  Cooking - Educational psychologist conducted group or individual video education with verbal and written material and guidebook.  Patient learns eating strategies to improve overall health, including an approach to cook more at home. Recommendations include thinking of animal protein as a side on your plate rather than center stage and focusing instead on lower calorie dense options like vegetables, fruits, whole grains, and plant-based proteins, such as beans. Making sauces in large quantities to freeze for later and leaving the skin on your vegetables are also recommended to maximize your experience.  Cooking - Healthy Salads and Dressing Clinical staff conducted group or individual video education with verbal and written material and guidebook.  Patient learns that  vegetables, fruits, whole grains, and legumes are the foundations of the Pritikin Eating Plan. Recommendations include how to incorporate each of these in flavorful and healthy salads, and how to create homemade salad dressings. Proper handling of ingredients is also covered. Cooking - Soups and [employer] - Soups and Desserts Clinical staff conducted group or individual video education with verbal and written material and guidebook.  Patient learns that Pritikin soups and desserts make for easy, nutritious, and delicious snacks and meal components that are low in sodium, fat, sugar, and calorie density, while high in vitamins, minerals, and filling fiber. Recommendations include simple and healthy ideas for soups and desserts.   Overview     The Pritikin Solution Program Overview Clinical staff conducted group or individual video education with verbal and written material and guidebook.  Patient learns that the results of the Pritikin Program have been documented in more than 100 articles published in peer-reviewed journals, and the benefits include reducing risk factors for  (and, in some cases, even reversing) high cholesterol, high blood pressure, type 2 diabetes, obesity, and more! An overview of the three key pillars of the Pritikin Program will be covered: eating well, doing regular exercise, and having a healthy mind-set.  WORKSHOPS  Exercise: Exercise Basics: Building Your Action Plan Clinical staff led group instruction and group discussion with PowerPoint presentation and patient guidebook. To enhance the learning environment the use of posters, models and videos may be added. At the conclusion of this workshop, patients will comprehend the difference between physical activity and exercise, as well as the benefits of incorporating both, into their routine. Patients will understand the FITT (Frequency, Intensity, Time, and Type) principle and how to use it to build an exercise action plan. In addition, safety concerns and other considerations for exercise and cardiac rehab will be addressed by the presenter. The purpose of this lesson is to promote a comprehensive and effective weekly exercise routine in order to improve patients' overall level of fitness.   Managing Heart Disease: Your Path to a Healthier Heart Clinical staff led group instruction and group discussion with PowerPoint presentation and patient guidebook. To enhance the learning environment the use of posters, models and videos may be added.At the conclusion of this workshop, patients will understand the anatomy and physiology of the heart. Additionally, they will understand how Pritikin's three pillars impact the risk factors, the progression, and the management of heart disease.  The purpose of this lesson is to provide a high-level overview of the heart, heart disease, and how the Pritikin lifestyle positively impacts risk factors.  Exercise Biomechanics Clinical staff led group instruction and group discussion with PowerPoint presentation and patient guidebook. To enhance the learning  environment the use of posters, models and videos may be added. Patients will learn how the structural parts of their bodies function and how these functions impact their daily activities, movement, and exercise. Patients will learn how to promote a neutral spine, learn how to manage pain, and identify ways to improve their physical movement in order to promote healthy living. The purpose of this lesson is to expose patients to common physical limitations that impact physical activity. Participants will learn practical ways to adapt and manage aches and pains, and to minimize their effect on regular exercise. Patients will learn how to maintain good posture while sitting, walking, and lifting.  Balance Training and Fall Prevention  Clinical staff led group instruction and group discussion with PowerPoint presentation and patient guidebook. To enhance the  learning environment the use of posters, models and videos may be added. At the conclusion of this workshop, patients will understand the importance of their sensorimotor skills (vision, proprioception, and the vestibular system) in maintaining their ability to balance as they age. Patients will apply a variety of balancing exercises that are appropriate for their current level of function. Patients will understand the common causes for poor balance, possible solutions to these problems, and ways to modify their physical environment in order to minimize their fall risk. The purpose of this lesson is to teach patients about the importance of maintaining balance as they age and ways to minimize their risk of falling.  WORKSHOPS   Nutrition:  Fueling a Ship broker led group instruction and group discussion with PowerPoint presentation and patient guidebook. To enhance the learning environment the use of posters, models and videos may be added. Patients will review the foundational principles of the Pritikin Eating Plan and understand  what constitutes a serving size in each of the food groups. Patients will also learn Pritikin-friendly foods that are better choices when away from home and review make-ahead meal and snack options. Calorie density will be reviewed and applied to three nutrition priorities: weight maintenance, weight loss, and weight gain. The purpose of this lesson is to reinforce (in a group setting) the key concepts around what patients are recommended to eat and how to apply these guidelines when away from home by planning and selecting Pritikin-friendly options. Patients will understand how calorie density may be adjusted for different weight management goals.  Mindful Eating  Clinical staff led group instruction and group discussion with PowerPoint presentation and patient guidebook. To enhance the learning environment the use of posters, models and videos may be added. Patients will briefly review the concepts of the Pritikin Eating Plan and the importance of low-calorie dense foods. The concept of mindful eating will be introduced as well as the importance of paying attention to internal hunger signals. Triggers for non-hunger eating and techniques for dealing with triggers will be explored. The purpose of this lesson is to provide patients with the opportunity to review the basic principles of the Pritikin Eating Plan, discuss the value of eating mindfully and how to measure internal cues of hunger and fullness using the Hunger Scale. Patients will also discuss reasons for non-hunger eating and learn strategies to use for controlling emotional eating.  Targeting Your Nutrition Priorities Clinical staff led group instruction and group discussion with PowerPoint presentation and patient guidebook. To enhance the learning environment the use of posters, models and videos may be added. Patients will learn how to determine their genetic susceptibility to disease by reviewing their family history. Patients will gain  insight into the importance of diet as part of an overall healthy lifestyle in mitigating the impact of genetics and other environmental insults. The purpose of this lesson is to provide patients with the opportunity to assess their personal nutrition priorities by looking at their family history, their own health history and current risk factors. Patients will also be able to discuss ways of prioritizing and modifying the Pritikin Eating Plan for their highest risk areas  Menu  Clinical staff led group instruction and group discussion with PowerPoint presentation and patient guidebook. To enhance the learning environment the use of posters, models and videos may be added. Using menus brought in from [employer], or printed from [employer], patients will apply the Pritikin dining out guidelines that were presented in the [employer]  Out educational video. Patients will also be able to practice these guidelines in a variety of provided scenarios. The purpose of this lesson is to provide patients with the opportunity to practice hands-on learning of the Pritikin Dining Out guidelines with actual menus and practice scenarios.  Label Reading Clinical staff led group instruction and group discussion with PowerPoint presentation and patient guidebook. To enhance the learning environment the use of posters, models and videos may be added. Patients will review and discuss the Pritikin label reading guidelines presented in Pritikin's Label Reading Educational series video. Using fool labels brought in from local grocery stores and markets, patients will apply the label reading guidelines and determine if the packaged food meet the Pritikin guidelines. The purpose of this lesson is to provide patients with the opportunity to review, discuss, and practice hands-on learning of the Pritikin Label Reading guidelines with actual packaged food labels. Cooking School  Pritikin's [employer] are  designed to teach patients ways to prepare quick, simple, and affordable recipes at home. The importance of nutrition's role in chronic disease risk reduction is reflected in its emphasis in the overall Pritikin program. By learning how to prepare essential core Pritikin Eating Plan recipes, patients will increase control over what they eat; be able to customize the flavor of foods without the use of added salt, sugar, or fat; and improve the quality of the food they consume. By learning a set of core recipes which are easily assembled, quickly prepared, and affordable, patients are more likely to prepare more healthy foods at home. These workshops focus on convenient breakfasts, simple entres, side dishes, and desserts which can be prepared with minimal effort and are consistent with nutrition recommendations for cardiovascular risk reduction. Cooking [employer] are taught by a Armed forces logistics/support/administrative officer (RD) who has been trained by the [employer]. The chef or RD has a clear understanding of the importance of minimizing - if not completely eliminating - added fat, sugar, and sodium in recipes. Throughout the series of Cooking School Workshop sessions, patients will learn about healthy ingredients and efficient methods of cooking to build confidence in their capability to prepare    Cooking School weekly topics:  Adding Flavor- Sodium-Free  Fast and Healthy Breakfasts  Powerhouse Plant-Based Proteins  Satisfying Salads and Dressings  Simple Sides and Sauces  International Cuisine-Spotlight on the [employer] Zones  Delicious Desserts  Savory Soups  [employer] - Meals in a Astronomer Appetizers and Snacks  Comforting Weekend Breakfasts  One-Pot Wonders   Fast Evening Meals  Landscape architect Your Pritikin Plate  WORKSHOPS   Healthy Mindset (Psychosocial):  Focused Goals, Sustainable Changes Clinical staff led group instruction and group discussion  with PowerPoint presentation and patient guidebook. To enhance the learning environment the use of posters, models and videos may be added. Patients will be able to apply effective goal setting strategies to establish at least one personal goal, and then take consistent, meaningful action toward that goal. They will learn to identify common barriers to achieving personal goals and develop strategies to overcome them. Patients will also gain an understanding of how our mind-set can impact our ability to achieve goals and the importance of cultivating a positive and growth-oriented mind-set. The purpose of this lesson is to provide patients with a deeper understanding of how to set and achieve personal goals, as well as the tools and strategies needed to overcome common obstacles which may arise along the way.  From Head to Heart: The Power of a Healthy Outlook  Clinical staff led group instruction and group discussion with PowerPoint presentation and patient guidebook. To enhance the learning environment the use of posters, models and videos may be added. Patients will be able to recognize and describe the impact of emotions and mood on physical health. They will discover the importance of self-care and explore self-care practices which may work for them. Patients will also learn how to utilize the 4 C's to cultivate a healthier outlook and better manage stress and challenges. The purpose of this lesson is to demonstrate to patients how a healthy outlook is an essential part of maintaining good health, especially as they continue their cardiac rehab journey.  Healthy Sleep for a Healthy Heart Clinical staff led group instruction and group discussion with PowerPoint presentation and patient guidebook. To enhance the learning environment the use of posters, models and videos may be added. At the conclusion of this workshop, patients will be able to demonstrate knowledge of the importance of sleep to overall  health, well-being, and quality of life. They will understand the symptoms of, and treatments for, common sleep disorders. Patients will also be able to identify daytime and nighttime behaviors which impact sleep, and they will be able to apply these tools to help manage sleep-related challenges. The purpose of this lesson is to provide patients with a general overview of sleep and outline the importance of quality sleep. Patients will learn about a few of the most common sleep disorders. Patients will also be introduced to the concept of "sleep hygiene," and discover ways to self-manage certain sleeping problems through simple daily behavior changes. Finally, the workshop will motivate patients by clarifying the links between quality sleep and their goals of heart-healthy living.   Recognizing and Reducing Stress Clinical staff led group instruction and group discussion with PowerPoint presentation and patient guidebook. To enhance the learning environment the use of posters, models and videos may be added. At the conclusion of this workshop, patients will be able to understand the types of stress reactions, differentiate between acute and chronic stress, and recognize the impact that chronic stress has on their health. They will also be able to apply different coping mechanisms, such as reframing negative self-talk. Patients will have the opportunity to practice a variety of stress management techniques, such as deep abdominal breathing, progressive muscle relaxation, and/or guided imagery.  The purpose of this lesson is to educate patients on the role of stress in their lives and to provide healthy techniques for coping with it.  Learning Barriers/Preferences:  Learning Barriers/Preferences - 08/16/22 1001       Learning Barriers/Preferences   Learning Barriers None    Learning Preferences Audio;Computer/Internet;Group Instruction;Individual Instruction;Pictoral;Skilled Demonstration;Verbal  Instruction;Video;Written Material             Education Topics:  Knowledge Questionnaire Score:  Knowledge Questionnaire Score - 08/16/22 0919       Knowledge Questionnaire Score   Pre Score 20/24             Core Components/Risk Factors/Patient Goals at Admission:  Personal Goals and Risk Factors at Admission - 08/16/22 1003       Core Components/Risk Factors/Patient Goals on Admission    Weight Management Yes;Obesity;Weight Loss    Intervention Weight Management: Develop a combined nutrition and exercise program designed to reach desired caloric intake, while maintaining appropriate intake of nutrient and fiber, sodium and fats, and appropriate energy expenditure required for the weight goal.;Weight Management:  Provide education and appropriate resources to help participant work on and attain dietary goals.;Weight Management/Obesity: Establish reasonable short term and long term weight goals.;Obesity: Provide education and appropriate resources to help participant work on and attain dietary goals.    Admit Weight 312 lb 2.7 oz (141.6 kg)    Expected Outcomes Long Term: Adherence to nutrition and physical activity/exercise program aimed toward attainment of established weight goal;Short Term: Continue to assess and modify interventions until short term weight is achieved;Weight Loss: Understanding of general recommendations for a balanced deficit meal plan, which promotes 1-2 lb weight loss per week and includes a negative energy balance of [phone] kcal/d;Understanding of distribution of calorie intake throughout the day with the consumption of 4-5 meals/snacks;Understanding recommendations for meals to include 15-35% energy as protein, 25-35% energy from fat, 35-60% energy from carbohydrates, less than 200mg  of dietary cholesterol, 20-35 gm of total fiber daily    Heart Failure Yes    Intervention Provide a combined exercise and nutrition program that is supplemented with  education, support and counseling about heart failure. Directed toward relieving symptoms such as shortness of breath, decreased exercise tolerance, and extremity edema.    Expected Outcomes Improve functional capacity of life;Short term: Attendance in program 2-3 days a week with increased exercise capacity. Reported lower sodium intake. Reported increased fruit and vegetable intake. Reports medication compliance.;Short term: Daily weights obtained and reported for increase. Utilizing diuretic protocols set by physician.;Long term: Adoption of self-care skills and reduction of barriers for early signs and symptoms recognition and intervention leading to self-care maintenance.    Hypertension Yes    Intervention Provide education on lifestyle modifcations including regular physical activity/exercise, weight management, moderate sodium restriction and increased consumption of fresh fruit, vegetables, and low fat dairy, alcohol moderation, and smoking cessation.;Monitor prescription use compliance.    Expected Outcomes Short Term: Continued assessment and intervention until BP is < 140/35mm HG in hypertensive participants. < 130/44mm HG in hypertensive participants with diabetes, heart failure or chronic kidney disease.;Long Term: Maintenance of blood pressure at goal levels.    Lipids Yes    Intervention Provide education and support for participant on nutrition & aerobic/resistive exercise along with prescribed medications to achieve LDL 70mg , HDL >40mg .    Expected Outcomes Short Term: Participant states understanding of desired cholesterol values and is compliant with medications prescribed. Participant is following exercise prescription and nutrition guidelines.;Long Term: Cholesterol controlled with medications as prescribed, with individualized exercise RX and with personalized nutrition plan. Value goals: LDL < 70mg , HDL > 40 mg.    Stress Yes    Intervention Offer individual and/or small group  education and counseling on adjustment to heart disease, stress management and health-related lifestyle change. Teach and support self-help strategies.;Refer participants experiencing significant psychosocial distress to appropriate mental health specialists for further evaluation and treatment. When possible, include family members and significant others in education/counseling sessions.    Expected Outcomes Short Term: Participant demonstrates changes in health-related behavior, relaxation and other stress management skills, ability to obtain effective social support, and compliance with psychotropic medications if prescribed.;Long Term: Emotional wellbeing is indicated by absence of clinically significant psychosocial distress or social isolation.             Core Components/Risk Factors/Patient Goals Review:   Goals and Risk Factor Review     Row Name 08/23/22 1545 09/13/22 1800           Core Components/Risk Factors/Patient Goals Review   Personal Goals Review Weight Management/Obesity;Hypertension;Lipids;Stress Weight Management/Obesity;Hypertension;Lipids;Stress  Review Ebrahim started intensive cardiac rehab on 08/22/22. Jahziel did well with exercise, vital signs were stable Jani is doing  well with exercise at intensive cardiac rehab. vital signs have stable. Bonifacio has lost 4.2 kg since starting the program      Expected Outcomes Kotaro will continue to participate in intensive cardiac rehab for exercise, nutrition and lifestyle modifications [place] will continue to participate in intensive cardiac rehab for exercise, nutrition and lifestyle modifications               Core Components/Risk Factors/Patient Goals at Discharge (Final Review):   Goals and Risk Factor Review - 09/13/22 1800       Core Components/Risk Factors/Patient Goals Review   Personal Goals Review Weight Management/Obesity;Hypertension;Lipids;Stress    Review Kaley is doing  well with exercise at  intensive cardiac rehab. vital signs have stable. Nesbitt has lost 4.2 kg since starting the program    Expected Outcomes Jamee will continue to participate in intensive cardiac rehab for exercise, nutrition and lifestyle modifications             ITP Comments:  ITP Comments     Row Name 08/16/22 0806 08/23/22 1505 09/13/22 1756       ITP Comments Armanda Magic, MD: Medical Director.  Introduction to the [employer] / Intensive Cardiac Rehab. Initial orientation packet reviewed with the patient. 30 Day ITP Review. [facility] started intensive cardiac rehab on 08/22/22 and did well with exercise. 30 Day ITP Review. Joseeduardo has good attendance and participation in intensive cardiac rehab              Comments: See ITP comments

## 2022-09-14 ENCOUNTER — Encounter (HOSPITAL_COMMUNITY): Payer: Self-pay

## 2022-09-14 ENCOUNTER — Encounter (HOSPITAL_COMMUNITY)
Admission: RE | Admit: 2022-09-14 | Discharge: 2022-09-14 | Disposition: A | Discharge status: Home / Self Care | Payer: BC Managed Care – PPO | Source: Ambulatory Visit | Attending: Internal Medicine | Admitting: Internal Medicine

## 2022-09-14 ENCOUNTER — Ambulatory Visit (HOSPITAL_COMMUNITY)
Admission: RE | Admit: 2022-09-14 | Discharge: 2022-09-14 | Disposition: A | Discharge status: Home / Self Care | Payer: BC Managed Care – PPO | Source: Ambulatory Visit | Attending: Internal Medicine | Admitting: Internal Medicine

## 2022-09-14 VITALS — BP 158/120 | HR 64 | Wt 307.4 lb

## 2022-09-14 DIAGNOSIS — E669 Obesity, unspecified: Secondary | ICD-10-CM | POA: Insufficient documentation

## 2022-09-14 DIAGNOSIS — Z955 Presence of coronary angioplasty implant and graft: Secondary | ICD-10-CM

## 2022-09-14 DIAGNOSIS — I2510 Atherosclerotic heart disease of native coronary artery without angina pectoris: Secondary | ICD-10-CM | POA: Insufficient documentation

## 2022-09-14 DIAGNOSIS — Z79899 Other long term (current) drug therapy: Secondary | ICD-10-CM | POA: Diagnosis not present

## 2022-09-14 DIAGNOSIS — F4320 Adjustment disorder, unspecified: Secondary | ICD-10-CM | POA: Diagnosis not present

## 2022-09-14 DIAGNOSIS — I5022 Chronic systolic (congestive) heart failure: Secondary | ICD-10-CM

## 2022-09-14 DIAGNOSIS — M109 Gout, unspecified: Secondary | ICD-10-CM | POA: Diagnosis not present

## 2022-09-14 DIAGNOSIS — G4733 Obstructive sleep apnea (adult) (pediatric): Secondary | ICD-10-CM

## 2022-09-14 DIAGNOSIS — I5041 Acute combined systolic (congestive) and diastolic (congestive) heart failure: Secondary | ICD-10-CM

## 2022-09-14 DIAGNOSIS — I25110 Atherosclerotic heart disease of native coronary artery with unstable angina pectoris: Secondary | ICD-10-CM | POA: Diagnosis not present

## 2022-09-14 DIAGNOSIS — I110 Hypertensive heart disease with heart failure: Secondary | ICD-10-CM | POA: Diagnosis not present

## 2022-09-14 DIAGNOSIS — Z6841 Body Mass Index (BMI) 40.0 and over, adult: Secondary | ICD-10-CM | POA: Diagnosis not present

## 2022-09-14 DIAGNOSIS — E785 Hyperlipidemia, unspecified: Secondary | ICD-10-CM | POA: Diagnosis not present

## 2022-09-14 DIAGNOSIS — Z7984 Long term (current) use of oral hypoglycemic drugs: Secondary | ICD-10-CM | POA: Insufficient documentation

## 2022-09-14 DIAGNOSIS — Z7901 Long term (current) use of anticoagulants: Secondary | ICD-10-CM | POA: Insufficient documentation

## 2022-09-14 DIAGNOSIS — R0789 Other chest pain: Secondary | ICD-10-CM | POA: Insufficient documentation

## 2022-09-14 DIAGNOSIS — Z7902 Long term (current) use of antithrombotics/antiplatelets: Secondary | ICD-10-CM | POA: Diagnosis not present

## 2022-09-14 DIAGNOSIS — F419 Anxiety disorder, unspecified: Secondary | ICD-10-CM | POA: Diagnosis not present

## 2022-09-14 DIAGNOSIS — I4891 Unspecified atrial fibrillation: Secondary | ICD-10-CM | POA: Diagnosis not present

## 2022-09-14 DIAGNOSIS — R42 Dizziness and giddiness: Secondary | ICD-10-CM | POA: Insufficient documentation

## 2022-09-14 DIAGNOSIS — I480 Paroxysmal atrial fibrillation: Secondary | ICD-10-CM

## 2022-09-14 DIAGNOSIS — I5042 Chronic combined systolic (congestive) and diastolic (congestive) heart failure: Secondary | ICD-10-CM

## 2022-09-14 DIAGNOSIS — Z87891 Personal history of nicotine dependence: Secondary | ICD-10-CM | POA: Diagnosis not present

## 2022-09-14 DIAGNOSIS — I10 Essential (primary) hypertension: Secondary | ICD-10-CM

## 2022-09-14 LAB — CBC
HCT: 48.1 % (ref 39.0–52.0)
Hemoglobin: 16.6 g/dL (ref 13.0–17.0)
MCH: 30.6 pg (ref 26.0–34.0)
MCHC: 34.5 g/dL (ref 30.0–36.0)
MCV: 88.7 fL (ref 80.0–100.0)
Platelets: 223 10*3/uL (ref 150–400)
RBC: 5.42 MIL/uL (ref 4.22–5.81)
RDW: 12.5 % (ref 11.5–15.5)
WBC: 10.8 10*3/uL — ABNORMAL HIGH (ref 4.0–10.5)
nRBC: 0 % (ref 0.0–0.2)

## 2022-09-14 LAB — BASIC METABOLIC PANEL
Anion gap: 11 (ref 5–15)
BUN: 17 mg/dL (ref 6–20)
CO2: 26 mmol/L (ref 22–32)
Calcium: 9.3 mg/dL (ref 8.9–10.3)
Chloride: 102 mmol/L (ref 98–111)
Creatinine, Ser: 1.17 mg/dL (ref 0.61–1.24)
GFR, Estimated: >60 mL/min (ref >60)
Glucose, Bld: 113 mg/dL — ABNORMAL HIGH (ref 70–99)
Potassium: 3.9 mmol/L (ref 3.5–5.1)
Sodium: 139 mmol/L (ref 135–145)

## 2022-09-14 LAB — BRAIN NATRIURETIC PEPTIDE: B Natriuretic Peptide: 6.2 pg/mL (ref 0.0–100.0)

## 2022-09-14 MED ORDER — ISOSORBIDE MONONITRATE ER 30 MG PO TB24: 45.0000 mg | ORAL_TABLET | Freq: Every day | ORAL | 11 refills | Status: DC

## 2022-09-14 MED ORDER — DOXAZOSIN MESYLATE 2 MG PO TABS: 2.0000 mg | ORAL_TABLET | Freq: Every day | ORAL | 11 refills | Status: DC

## 2022-09-14 NOTE — Progress Notes (Signed)
Advanced Heart Failure Clinic Note   PCP: Halford Chessman, MD [facility]: Dr. Gala Romney  EP: Dr. Lalla Brothers   Reason for Visit: F/u for chronic systolic heart failure, PAF and CAD s/p PCI   HPI: Mr Alexander [name] is a 47 y.o. year old with systolic heart failure (presumed tachy-mediated), obesity, HTN, hyperlipidemia, adjustment disorder, anxiety, and A fib.  Presented to [facility] on 04/24/20 with palpitations and SOB in setting of heavy ETOH use EKG showed A Fib RVR. Started on diltiazem with plans for admit but he left AMA. Discharged on diltiazem + Xarelto.    Presented to [facility] ED on 04/25/20 with HF symptoms. EKG showed A Fib RVR. Admitted and started on diltiazem. Echo showed EF 35-40%. EP consulted. Started on metoprolol and losartan with plans to reassess in 3 weeks for possible cardioversion.    Sent back to [facility] ED from EP clinic on 05/13/20 with N/V and A fib RVR. On arrival he was cool and volume overloaed.  Started on amio drip + milrinone due to cardiogenic shock. Echo EF 10-15%. Diuresed with IV lasix and later stopped with addition of GDMT. After amio load had successful cardioversion.   Echo 3/22 EF 45-50% RV ok. R/LHC on 08/17/20. Found to have  pLAD lesion of 70% and 80% ostial Lcx. Dr. Clifton James preformed PCI/DESx1 to pLcx. Pressure wire analysis of the mLAD noting RFR is 0.93 suggesting LAD stenosis is not flow limiting. 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 LVEDP only 4.   Echo 10/22: EF 55-60%. Lost to follow up.   Admitted 3/24 with CP. Cath showed 80% prox to mid LAD treated with Synergy XD 3.5x28 mm DES, 45% mid RCA, 55% PRDA, patent LCx stent. Plan Xarelto, Plavix and ASA x 1 month, then drop ASA; continue Plavix x 6 months then stop. Echo showed EF 60-65%, RV ok. GDMT titrated and discharged home, weight 300 lbs.  Post hospital HF follow up 3/24. Last seen in AHF clinic 02/2021. NYHA I and volume stable. Doing well with Cardiac Rehab.  Today he returns for an acute  follow up for chest discomfort and hypertension. He returned from vacation at [facility] and had CP and elevated BP and was seed in ED last week. Had been out of Farxiga x 10 days. ECG without acute changes, HsTroponin 12. CXR ok. BP normalized and he was discharged home. Today, he has persistent ache in left upper chest, worse with inspiration. Discomfort is more of a tightness. He is not SOB, feels like he has a hard time taking a deep breath. This is similar feeling as to when he is anxious. Feels head pressure, dizziness when looking at computer screen.  Feels foggy.  Denies palpitations, abnormal bleeding, edema, or PND/Orthopnea. Appetite ok. No fever or chills. Weight at home 306-307 pounds. Taking all medications. Out of Farxiga x 10 days before symptoms started. Took 1 SL nitro a few days ago, unsure if it helped. BP 150-95 on home cuff but BP at CR 120/80s. He checked his BP with his home cuff today in office and was 146/98.   Cardiac Studies - Echo (3/24): EF 60-65%, RV ok  - LHC (3/24): 80% mLAD, s/p DES, RCA 45%, RPDA lesion 55%  - Echo (10/22): EF 55-60%  - LHC (4/22): Prox Cx lesion is 80% stenosed. Mid LAD lesion is 70% stenosed. A drug-eluting stent was successfully placed using a STENT RESOLUTE ONYX 4.0X26. Post intervention, there is a 0% residual stenosis.  1. Severe proximal Circumflex stenosis 2. Successful PTCA/DES x 1 proximal Circumflex 3. Pressure wire analysis of the mid LAD. RFR is 0.93 suggesting the mid stenosis is not flow limiting.  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 LVEDP only 4.   - Echo (3/22): LVEF 45-50%, RV ok   - Echo (12/21): LVEF 35-40%, RV ok   ROS: All systems reviewed and negative except as per HPI.   Past Medical History:  Diagnosis Date   Arrhythmia    Atrial fibrillation (HCC)    CAD (coronary artery disease) 08/17/2020   PCI + DES to pLCx, 70% mLAD negative FFR testing (medical therapy)    CHF (congestive heart failure) (HCC)     Chronic systolic heart failure (HCC)    Gout    Hyperlipidemia    Hypertension    OSA (obstructive sleep apnea) 09/2020    Current Outpatient Medications  Medication Sig Dispense Refill   acetaminophen (TYLENOL) 500 MG tablet Take 500 mg by mouth every 6 (six) hours as needed for moderate pain.     allopurinol (ZYLOPRIM) 100 MG tablet Take 100 mg by mouth daily.     amiodarone (PACERONE) 200 MG tablet Take 0.5 tablets (100 mg total) by mouth daily. 45 tablet 3   buPROPion (WELLBUTRIN XL) 300 MG 24 hr tablet Take 300 mg by mouth daily.     carvedilol (COREG) 6.25 MG tablet TAKE 1 TABLET BY MOUTH 2 TIMES DAILY WITH A MEAL. 180 tablet 3   clonazePAM (KLONOPIN) 0.5 MG tablet Take 0.5 mg by mouth 2 (two) times daily as needed for anxiety.     clopidogrel (PLAVIX) 75 MG tablet Take 1 tablet (75 mg total) by mouth daily. 90 tablet 3   colchicine 0.6 MG tablet Take 0.6 mg by mouth 2 (two) times daily as needed (gout).     dapagliflozin propanediol (FARXIGA) 10 MG TABS tablet TAKE 1 TABLET BY MOUTH EVERY DAY 30 tablet 0   doxazosin (CARDURA) 1 MG tablet TAKE 1 TABLET BY MOUTH EVERYDAY AT BEDTIME (Patient taking differently: Take 2 mg by mouth at bedtime.) 71 tablet 0   ENTRESTO 97-103 MG TAKE 1 TABLET BY MOUTH TWICE A DAY 60 tablet 1   fenofibrate (TRICOR) 145 MG tablet Take 145 mg by mouth daily.     furosemide (LASIX) 20 MG tablet TAKE 1 TABLET BY MOUTH AS NEEDED FOR FLUID OR EDEMA 30 tablet 5   isosorbide mononitrate (IMDUR) 30 MG 24 hr tablet Take 1 tablet (30 mg total) by mouth daily. 90 tablet 1   Multiple Vitamin (MULTIVITAMIN WITH MINERALS) TABS tablet Take 1 tablet by mouth daily.     nitroGLYCERIN (NITROSTAT) 0.4 MG SL tablet Place 1 tablet (0.4 mg total) under the tongue every 5 (five) minutes as needed for chest pain. 25 tablet 3   potassium chloride SA (KLOR-CON) 20 MEQ tablet Take 1 tablet (20 mEq total) by mouth as needed (with Lasix). 90 tablet 3   rosuvastatin (CRESTOR) 40 MG tablet  TAKE 1 TABLET BY MOUTH EVERY DAY 30 tablet 0   spironolactone (ALDACTONE) 25 MG tablet TAKE 1 TABLET (25 MG TOTAL) BY MOUTH DAILY. 30 tablet 6   traMADol (ULTRAM) 50 MG tablet Take 50 mg by mouth 3 (three) times daily as needed for moderate pain.     XARELTO 20 MG TABS tablet TAKE 1 TABLET BY MOUTH EVERY DAY 30 tablet 5   Semaglutide-Weight Management (WEGOVY) 1 MG/0.5ML SOAJ Inject 1 mg into the  skin once a week. (Patient not taking: Reported on 09/14/2022) 2 mL 11   No current facility-administered medications for this encounter.    Allergies  Allergen Reactions   Albuterol Anaphylaxis   Bee Venom Anaphylaxis   Metaproterenol Anaphylaxis   Other Anaphylaxis    DEET   Amoxicillin-Pot Clavulanate Itching    OK with Amoxicillin   Cephalexin Other (See Comments)    Chest tightness   Amoxicillin Itching and Rash    Itching all over       Social History   Socioeconomic History   Marital status: Divorced    Spouse name: Not on file   Number of children: 2   Years of education: 16   Highest education level: Bachelor's degree (e.g., BA, AB, BS)  Occupational History   Not on file  Tobacco Use   Smoking status: Former    Types: Cigars    Quit date: 08/14/2020    Years since quitting: 2.0   Smokeless tobacco: Never  Substance and Sexual Activity   Alcohol use: Yes    Alcohol/week: 1.0 standard drink of alcohol    Types: 1 Cans of beer per week    Comment: drinks beer socially 1 per week   Drug use: Not Currently   Sexual activity: Not on file  Other Topics Concern   Not on file  Social History Narrative   Not on file   Social Determinants of Health   Financial Resource Strain: Not on file  Food Insecurity: No Food Insecurity (07/12/2022)   Hunger Vital Sign    Worried About Running Out of Food in the Last Year: Never true    Ran Out of Food in the Last Year: Never true  Transportation Needs: No Transportation Needs (07/12/2022)   PRAPARE - Scientist, research (physical sciences) (Medical): No    Lack of Transportation (Non-Medical): No  Physical Activity: Not on file  Stress: Not on file  Social Connections: Not on file  Intimate Partner Violence: Not At Risk (07/12/2022)   Humiliation, Afraid, Rape, and Kick questionnaire    Fear of Current or Ex-Partner: No    Emotionally Abused: No    Physically Abused: No    Sexually Abused: No   Family History  Problem Relation Age of Onset   COPD Mother    BP (!) 150/84   Pulse 64   Wt (!) 139.4 kg (307 lb 6.4 oz)   SpO2 97%   BMI 40.01 kg/m   Wt Readings from Last 3 Encounters:  09/14/22 (!) 139.4 kg (307 lb 6.4 oz)  08/16/22 (!) 141.6 kg (312 lb 2.7 oz)  08/05/22 (!) 138.3 kg (305 lb)   PHYSICAL EXAM: General:  NAD. No resp difficulty, walked into clinic HEENT: Normal Neck: Supple. No JVD, thick neck. Carotids 2+ bilat; no bruits. No lymphadenopathy or thryomegaly appreciated. Cor: PMI nondisplaced. Regular rate & rhythm. No rubs, gallops or murmurs. +TTP mid upper sternum Lungs: Clear Abdomen: Soft, obese, nontender, nondistended. No hepatosplenomegaly. No bruits or masses. Good bowel sounds. Extremities: No cyanosis, clubbing, rash, edema Neuro: Alert & oriented x 3, cranial nerves grossly intact. Moves all 4 extremities w/o difficulty. Affect pleasant.  ECG (personally reviewed): SB 58 bpm  ReDs: 33%  ASSESSMENT & PLAN: 1. Chronic Systolic Heart Failure - Echo (12/21): EF 35-40%, RV mildly reduced  - Admit (1/22) for a/c CHF>>cardiogenic shock in the setting of rapid afib  - Echo (1/22): EF <20%, RV mildly reduced - Echo (3/22):  EF 45-50% RV ok.  - Echo (10/22): EF 55-60% RV normal  - Suspect primarily tachy mediated  - Echo (3/24): EF 60-65%, RV ok - NYHA I-II, volume OK today, ReDs 33%. Orthostatics negative in clinic today - Continue Lasix 20 mg/20 KCL PRN.  - Continue Entresto 97-103 mg bid. - Continue Farxiga 10 mg daily.  - Continue spiro 25 mg daily.  - Continue  carvedilol 6.25 mg bid.  - Labs today.  2. CAD:  - LHC 08/17/20 70% mid LAD, non flow limiting by FFR anyalsis, 80% pLCx treated w/ PCI + DES. Medical management for LAD stenosis.  - LHC (3/24) s/p DES mLAD. - Current symptoms likely related to transient volume overload vs MSK. ECG ok today. Anxiety likely playing role here as well. - Increase Imdur to 45 mg daily. - Continue Plavix + Xarelto + ASA, can drop ASA x 1 month; then drop Plavix after 6 months. - Continue ? blocker + statin. - Continue CR  3. PAF - Maintaining NSR post DCCV. - On amiodarone 100 mg daily. Plan stopping soon if no recurrence  - Continue Xarelto. - Avoid ETOH and get weight down - Continue CPAP - Amio labs ok. - Check CBC today  4. Obesity Body mass index is 40.01 kg/m. - He has met with a nutritionist and is working on weight loss - On GLP1RA  5. OSA - Continue CPAP.  6. HTN - BP elevated. - Increase Imdur as above. - Increase doxazosin to 2 mg qhs. - Continue weight loss efforts - Home BP cuff correlates with clinic readings.  7. Gout - Followed by Dr. Dierdre Forth ([facility]).  Follow up with Dr. Gala Romney, as scheduled.  Anderson [place], FNP 09/14/22

## 2022-09-14 NOTE — Patient Instructions (Addendum)
EKG done today.  RedsClip done today.   Labs done today. We will contact you only if your labs are abnormal.  INCREASE Imdur to 45mg  (1 & 1/2 tablets) by mouth daily.  INCREASE Cardura to 2g (1 tablet) by mouth daily at bedtime.   No other medication changes were made. Please continue all current medications as prescribed.  Your physician recommends that you schedule a follow-up appointment in: September with Dr. Gala Romney  If you have any questions or concerns before your next appointment please send [place] a message through [facility] or call our office at [phone].    TO LEAVE A MESSAGE FOR THE NURSE SELECT OPTION 2, PLEASE LEAVE A MESSAGE INCLUDING: YOUR NAME DATE OF BIRTH CALL BACK NUMBER REASON FOR CALL**this is important as we prioritize the call backs  YOU WILL RECEIVE A CALL BACK THE SAME DAY AS LONG AS YOU CALL BEFORE 4:00 PM   Do the following things EVERYDAY: Weigh yourself in the morning before breakfast. Write it down and keep it in a log. Take your medicines as prescribed Eat low salt foods--Limit salt (sodium) to 2000 mg per day.  Stay as active as you can everyday Limit all fluids for the day to less than 2 liters   At [facility], you and your health needs are our priority. As part of our continuing mission to provide you with exceptional heart care, we have created designated Provider Care Teams. These Care Teams include your primary Cardiologist (physician) and Advanced Practice Providers (APPs- Physician Assistants and Nurse Practitioners) who all work together to provide you with the care you need, when you need it.   You may see any of the following providers on your designated Care Team at your next follow up: Dr Arvilla Meres Dr Marca Ancona Dr. Marcos Eke, NP Robbie Lis, [place] [facility], [place] Brynda Peon, NP Karle Plumber, PharmD   Please be sure to bring in all your medications  bottles to every appointment.    Thank you for choosing [facility]

## 2022-09-16 ENCOUNTER — Encounter (HOSPITAL_COMMUNITY)
Admission: RE | Admit: 2022-09-16 | Discharge: 2022-09-16 | Disposition: A | Discharge status: Home / Self Care | Payer: BC Managed Care – PPO | Source: Ambulatory Visit | Attending: Internal Medicine | Admitting: Internal Medicine

## 2022-09-16 DIAGNOSIS — I5041 Acute combined systolic (congestive) and diastolic (congestive) heart failure: Secondary | ICD-10-CM

## 2022-09-16 DIAGNOSIS — Z955 Presence of coronary angioplasty implant and graft: Secondary | ICD-10-CM

## 2022-09-16 DIAGNOSIS — I5042 Chronic combined systolic (congestive) and diastolic (congestive) heart failure: Secondary | ICD-10-CM

## 2022-09-19 ENCOUNTER — Encounter (HOSPITAL_COMMUNITY)
Admission: RE | Admit: 2022-09-19 | Discharge: 2022-09-19 | Disposition: A | Discharge status: Home / Self Care | Payer: BC Managed Care – PPO | Source: Ambulatory Visit | Attending: Internal Medicine | Admitting: Internal Medicine

## 2022-09-19 ENCOUNTER — Ambulatory Visit: Payer: BC Managed Care – PPO

## 2022-09-19 ENCOUNTER — Encounter: Payer: Self-pay | Admitting: Internal Medicine

## 2022-09-19 ENCOUNTER — Ambulatory Visit: Payer: BC Managed Care – PPO | Attending: Internal Medicine | Admitting: Internal Medicine

## 2022-09-19 VITALS — BP 122/80 | HR 78 | Ht 73.0 in | Wt 302.0 lb

## 2022-09-19 DIAGNOSIS — I5022 Chronic systolic (congestive) heart failure: Secondary | ICD-10-CM

## 2022-09-19 DIAGNOSIS — I2510 Atherosclerotic heart disease of native coronary artery without angina pectoris: Secondary | ICD-10-CM

## 2022-09-19 DIAGNOSIS — I480 Paroxysmal atrial fibrillation: Secondary | ICD-10-CM

## 2022-09-19 DIAGNOSIS — I5042 Chronic combined systolic (congestive) and diastolic (congestive) heart failure: Secondary | ICD-10-CM

## 2022-09-19 DIAGNOSIS — Z955 Presence of coronary angioplasty implant and graft: Secondary | ICD-10-CM

## 2022-09-19 DIAGNOSIS — Z9861 Coronary angioplasty status: Secondary | ICD-10-CM

## 2022-09-19 DIAGNOSIS — I25110 Atherosclerotic heart disease of native coronary artery with unstable angina pectoris: Secondary | ICD-10-CM | POA: Diagnosis not present

## 2022-09-19 DIAGNOSIS — I10 Essential (primary) hypertension: Secondary | ICD-10-CM

## 2022-09-19 DIAGNOSIS — G4733 Obstructive sleep apnea (adult) (pediatric): Secondary | ICD-10-CM

## 2022-09-19 DIAGNOSIS — I5041 Acute combined systolic (congestive) and diastolic (congestive) heart failure: Secondary | ICD-10-CM | POA: Diagnosis not present

## 2022-09-19 DIAGNOSIS — E782 Mixed hyperlipidemia: Secondary | ICD-10-CM

## 2022-09-19 NOTE — Progress Notes (Signed)
Cardiology Office Note:    Date:  09/19/2022   ID:  Alexander [name], DOB December 02, 1975, MRN [id number]  PCP:  Halford Chessman, MD   [facility] Providers Cardiologist:  Arvilla Meres, MD Electrophysiologist:  Lanier Prude, MD  Sleep Medicine:  Armanda Magic, MD     Referring MD: Jacklynn Ganong, FNP   CC:  Transition to general cardiology; ED visit  History of Present Illness:    Alexander [name] is a 47 y.o. male with a hx of systolic heart failure (presumed tachy-mediated), obesity, HTN, hyperlipidemia, adjustment disorder, anxiety, and A fib.   Patient notes that he is doing much better.   Notes that he is still have some chest aching. Different from his angina prior to 07/12/2022. Cardiac Rehab is going well. Had two episode His AF symptoms is on edge palpations and SOB- no further symptoms.   Had ad ED visit for BP issues that has now controlled.  No chest pain or pressure .  No SOB/DOE and no PND/Orthopnea.  No weight gain or leg swelling.  No palpitations or syncope.   Past Medical History:  Diagnosis Date   Arrhythmia    Atrial fibrillation (HCC)    CAD (coronary artery disease) 08/17/2020   PCI + DES to pLCx, 70% mLAD negative FFR testing (medical therapy)    CHF (congestive heart failure) (HCC)    Chronic systolic heart failure (HCC)    Gout    Hyperlipidemia    Hypertension    OSA (obstructive sleep apnea) 09/2020    Past Surgical History:  Procedure Laterality Date   CARDIAC CATHETERIZATION     CARDIOVERSION N/A 05/15/2020   Procedure: CARDIOVERSION;  Surgeon: Dolores Patty, MD;  Location: [facility] ENDOSCOPY;  Service: Cardiovascular;  Laterality: N/A;   CORONARY IMAGING/OCT N/A 07/13/2022   Procedure: INTRAVASCULAR IMAGING/OCT;  Surgeon: Marykay Lex, MD;  Location: [facility] INVASIVE CV LAB;  Service: Cardiovascular;  Laterality: N/A;   CORONARY STENT INTERVENTION N/A 08/17/2020   Procedure: CORONARY STENT INTERVENTION;  Surgeon: Kathleene Hazel, MD;  Location: [facility] INVASIVE CV LAB;  Service: Cardiovascular;  Laterality: N/A;   CORONARY STENT INTERVENTION N/A 07/13/2022   Procedure: CORONARY STENT INTERVENTION;  Surgeon: Marykay Lex, MD;  Location: [facility] INVASIVE CV LAB;  Service: Cardiovascular;  Laterality: N/A;   LEFT HEART CATH AND CORONARY ANGIOGRAPHY N/A 07/13/2022   Procedure: LEFT HEART CATH AND CORONARY ANGIOGRAPHY;  Surgeon: Marykay Lex, MD;  Location: [facility] INVASIVE CV LAB;  Service: Cardiovascular;  Laterality: N/A;   RIGHT/LEFT HEART CATH AND CORONARY ANGIOGRAPHY N/A 08/17/2020   Procedure: RIGHT/LEFT HEART CATH AND CORONARY ANGIOGRAPHY;  Surgeon: Dolores Patty, MD;  Location: [facility] INVASIVE CV LAB;  Service: Cardiovascular;  Laterality: N/A;   SEPTOPLASTY      Current Medications: Current Meds  Medication Sig   acetaminophen (TYLENOL) 500 MG tablet Take 500 mg by mouth every 6 (six) hours as needed for moderate pain.   allopurinol (ZYLOPRIM) 100 MG tablet Take 100 mg by mouth daily.   buPROPion (WELLBUTRIN XL) 300 MG 24 hr tablet Take 300 mg by mouth daily.   carvedilol (COREG) 6.25 MG tablet TAKE 1 TABLET BY MOUTH 2 TIMES DAILY WITH A MEAL.   clonazePAM (KLONOPIN) 0.5 MG tablet Take 0.5 mg by mouth 2 (two) times daily as needed for anxiety.   clopidogrel (PLAVIX) 75 MG tablet Take 1 tablet (75 mg total) by mouth daily.   colchicine 0.6 MG tablet Take 0.6 mg by mouth  2 (two) times daily as needed (gout).   dapagliflozin propanediol (FARXIGA) 10 MG TABS tablet TAKE 1 TABLET BY MOUTH EVERY DAY   doxazosin (CARDURA) 2 MG tablet Take 1 tablet (2 mg total) by mouth at bedtime.   ENTRESTO 97-103 MG TAKE 1 TABLET BY MOUTH TWICE A DAY   fenofibrate (TRICOR) 145 MG tablet Take 145 mg by mouth daily.   furosemide (LASIX) 20 MG tablet TAKE 1 TABLET BY MOUTH AS NEEDED FOR FLUID OR EDEMA   isosorbide mononitrate (IMDUR) 30 MG 24 hr tablet Take 1.5 tablets (45 mg total) by mouth daily.   Multiple Vitamin  (MULTIVITAMIN WITH MINERALS) TABS tablet Take 1 tablet by mouth daily.   nitroGLYCERIN (NITROSTAT) 0.4 MG SL tablet Place 1 tablet (0.4 mg total) under the tongue every 5 (five) minutes as needed for chest pain.   potassium chloride SA (KLOR-CON) 20 MEQ tablet Take 1 tablet (20 mEq total) by mouth as needed (with Lasix).   rosuvastatin (CRESTOR) 40 MG tablet TAKE 1 TABLET BY MOUTH EVERY DAY   Semaglutide-Weight Management (WEGOVY) 1 MG/0.5ML SOAJ Inject 1 mg into the skin once a week.   spironolactone (ALDACTONE) 25 MG tablet TAKE 1 TABLET (25 MG TOTAL) BY MOUTH DAILY.   traMADol (ULTRAM) 50 MG tablet Take 50 mg by mouth 3 (three) times daily as needed for moderate pain.   XARELTO 20 MG TABS tablet TAKE 1 TABLET BY MOUTH EVERY DAY   [DISCONTINUED] amiodarone (PACERONE) 200 MG tablet Take 0.5 tablets (100 mg total) by mouth daily.     Allergies:   Albuterol, Bee venom, Metaproterenol, Other, Amoxicillin-pot clavulanate, Cephalexin, and Amoxicillin   Social History   Socioeconomic History   Marital status: Divorced    Spouse name: Not on file   Number of children: 2   Years of education: 16   Highest education level: Bachelor's degree (e.g., BA, AB, BS)  Occupational History   Not on file  Tobacco Use   Smoking status: Former    Types: Cigars    Quit date: 08/14/2020    Years since quitting: 2.0   Smokeless tobacco: Never  Substance and Sexual Activity   Alcohol use: Yes    Alcohol/week: 1.0 standard drink of alcohol    Types: 1 Cans of beer per week    Comment: drinks beer socially 1 per week   Drug use: Not Currently   Sexual activity: Not on file  Other Topics Concern   Not on file  Social History Narrative   Not on file   Social Determinants of Health   Financial Resource Strain: Not on file  Food Insecurity: No Food Insecurity (07/12/2022)   Hunger Vital Sign    Worried About Running Out of Food in the Last Year: Never true    Ran Out of Food in the Last Year: Never true   Transportation Needs: No Transportation Needs (07/12/2022)   PRAPARE - Administrator, Civil Service (Medical): No    Lack of Transportation (Non-Medical): No  Physical Activity: Not on file  Stress: Not on file  Social Connections: Not on file     Family History: The patient's family history includes COPD in his mother.  ROS:   Please see the history of present illness.     All other systems reviewed and are negative.  EKGs/Labs/Other Studies Reviewed:    Cardiac Studies & Procedures   CARDIAC CATHETERIZATION  CARDIAC CATHETERIZATION 07/13/2022  Narrative   Prox LAD to Mid LAD  lesion is 80% stenosed.-Progression of disease (stenosis confirmed via OCT)   A drug-eluting stent was successfully placed using a SYNERGY XD 3.50X28. ->  Deployed to 3.7 mm.  Taper postdilation from 4.6-4.22-3.7 mm = full expansion. Post intervention, there is a 0% residual stenosis.   Mid RCA lesion is 45% stenosed.  RPDA lesion is 55% stenosed.   Previously placed Ost Cx to Prox Cx stent of unknown type is  widely patent.   The left ventricular systolic function is normal.   The left ventricular ejection fraction is 50-55% by visual estimate.   There is no aortic valve stenosis.  POST-OPERATIVE DIAGNOSIS: Culprit Lesion: Progression of mid LAD lesion just after diagonal branch at SP1 in progress from 70% to roughly 80% OCT guided PCI from major diagonal branch just beyond the next branch: Synergy XD 3.5 mm x 28 mm deployed to 3.7 mm and postdilated in tapered fashion from 4.6-4.2-3.7 mm Widely patent LCx stent Distal RCA 45% and ostial PDA 55 % stenosis. LV gram relatively poor filling but EF does appear to be low normal 50 to 55%.  (Defer to echo)   PLAN OF CARE:  Overnight monitoring, restart Xarelto at p.m. dosing tonight; would do 1 month of aspirin, Plavix and Xarelto and then discontinue aspirin.  Continue Plavix for 6 months total,then discontinue  Findings Coronary  Findings Diagnostic  Dominance: Right  Left Anterior Descending Prox LAD to Mid LAD lesion is 80% stenosed. Vessel is the culprit lesion. The lesion is distal to major branch, segmental, eccentric and irregular. Optical coherence tomography (OCT) was performed. Minimum lumen area: 1.6 mm.  First Septal Branch Vessel is small in size.  Second Diagonal Branch Vessel is small in size.  Left Circumflex Previously placed Ost Cx to Prox Cx stent of unknown type is  widely patent. Previously placed stent displays no restenosis.  Right Coronary Artery Mid RCA lesion is 45% stenosed.  Right Posterior Descending Artery RPDA lesion is 55% stenosed.  Intervention  Prox LAD to Mid LAD lesion Stent Lesion length:  26 mm. CATH VISTA GUIDE 6FR XBLAD3.5 guide catheter was inserted. Lesion crossed with guidewire using a WIRE ASAHI PROWATER 180CM. Pre-stent angioplasty was performed using a BALLN EMERGE MR 3.0X20. Maximum pressure:  12 atm. Inflation time:  20 sec. 3 inflations A drug-eluting stent was successfully placed using a SYNERGY XD 3.50X28. Maximum pressure: 18 atm. Inflation time: 30 sec. Stent strut is well apposed. Confirmed by OCT after post dilation Post-stent angioplasty was performed using a BALLN [place] EUPHORA RX 4.5X8. Maximum pressure:  18 atm. Inflation time:  20 sec. The middle two thirds the stent was postdilated with BALLN [place] EMERGE MR 4.0X12 -16 ATM x 20 sec =&gt; 4.2 mm; proximal portion of stent POT -with 4.5 mm balloon Post-Intervention Lesion Assessment The intervention was successful. Pre-interventional TIMI flow is 3. Post-intervention TIMI flow is 3. Treated lesion length:  28 mm. No complications occurred at this lesion. There is a 0% residual stenosis post intervention.   CARDIAC CATHETERIZATION  CARDIAC CATHETERIZATION 08/17/2020  Narrative  Prox Cx lesion is 80% stenosed.  Mid LAD lesion is 70% stenosed.  A drug-eluting stent was successfully placed using a STENT  RESOLUTE ONYX 4.0X26.  Post intervention, there is a 0% residual stenosis.  1. Severe proximal Circumflex stenosis 2. Successful PTCA/DES x 1 proximal Circumflex 3. Pressure wire analysis of the mid LAD. RFR is 0.93 suggesting the mid stenosis is not flow limiting.  Recommendations: Will continue DAPT with ASA  and Plavix for at least one month. His ASA could be stopped in one month since he will also be on Xarelto. I would continue the Plavix for at least six months.  Findings Coronary Findings Diagnostic  Dominance: Right  Left Anterior Descending Mid LAD lesion is 70% stenosed. Pressure wire/FFR was performed on the lesion. FFR: 0.93. RFR=0.93  Left Circumflex Prox Cx lesion is 80% stenosed.  Intervention  Prox Cx lesion Stent CATH VISTA GUIDE 6FR XB3 guide catheter was inserted. Lesion crossed with guidewire using a WIRE COUGAR XT STRL 190CM. Pre-stent angioplasty was performed using a BALLOON SAPPHIRE 2.5X15. A drug-eluting stent was successfully placed using a STENT RESOLUTE ONYX 4.0X26. Stent strut is well apposed. Post-stent angioplasty was performed using a BALLOON [place] EMERGE MR 4.0X15. Post-Intervention Lesion Assessment The intervention was successful. Pre-interventional TIMI flow is 3. Post-intervention TIMI flow is 3. No complications occurred at this lesion. There is a 0% residual stenosis post intervention.     ECHOCARDIOGRAM  ECHOCARDIOGRAM COMPLETE 07/14/2022  Narrative ECHOCARDIOGRAM REPORT    Patient Name:   Alexander [name] Date of Exam: 07/14/2022 Medical Rec #:  [id number]     Height:       73.0 in Accession #:    [id number]    Weight:       300.0 lb Date of Birth:  [birth year]     BSA:          2.556 m Patient Age:    46 years      BP:           109/75 mmHg Patient Gender: M             HR:           69 bpm. Exam Location:  Inpatient  Procedure: 2D Echo, Cardiac Doppler and Color Doppler  Indications:    Chest Pain R07.9  History:        Patient has  prior history of Echocardiogram examinations, most recent 03/01/2021. CHF, Angina, Arrythmias:Atrial Fibrillation; Risk Factors:Hypertension and Dyslipidemia.  Sonographer:    Lucendia Herrlich Referring Phys: [phone] LINDSAY B ROBERTS  IMPRESSIONS   1. Left ventricular ejection fraction, by estimation, is 60 to 65%. The left ventricle has normal function. The left ventricle has no regional wall motion abnormalities. The left ventricular internal cavity size was mildly dilated. There is mild concentric left ventricular hypertrophy. Left ventricular diastolic parameters were normal. 2. Right ventricular systolic function is normal. The right ventricular size is normal. 3. Left atrial size was mildly dilated. 4. The mitral valve is normal in structure. No evidence of mitral valve regurgitation. No evidence of mitral stenosis. 5. The aortic valve is tricuspid. Aortic valve regurgitation is not visualized. No aortic stenosis is present. 6. The inferior vena cava is normal in size with greater than 50% respiratory variability, suggesting right atrial pressure of 3 mmHg.  FINDINGS Left Ventricle: Left ventricular ejection fraction, by estimation, is 60 to 65%. The left ventricle has normal function. The left ventricle has no regional wall motion abnormalities. The left ventricular internal cavity size was mildly dilated. There is mild concentric left ventricular hypertrophy. Left ventricular diastolic parameters were normal. Normal left ventricular filling pressure.  Right Ventricle: The right ventricular size is normal. No increase in right ventricular wall thickness. Right ventricular systolic function is normal.  Left Atrium: Left atrial size was mildly dilated.  Right Atrium: Right atrial size was normal in size.  Pericardium: There is no evidence of pericardial effusion.  Mitral  Valve: The mitral valve is normal in structure. No evidence of mitral valve regurgitation. No evidence of mitral  valve stenosis.  Tricuspid Valve: The tricuspid valve is normal in structure. Tricuspid valve regurgitation is not demonstrated. No evidence of tricuspid stenosis.  Aortic Valve: The aortic valve is tricuspid. Aortic valve regurgitation is not visualized. No aortic stenosis is present. Aortic valve mean gradient measures 6.0 mmHg. Aortic valve peak gradient measures 10.6 mmHg. Aortic valve area, by VTI measures 3.39 cm.  Pulmonic Valve: The pulmonic valve was normal in structure. Pulmonic valve regurgitation is not visualized. No evidence of pulmonic stenosis.  Aorta: The aortic root is normal in size and structure.  Venous: The inferior vena cava is normal in size with greater than 50% respiratory variability, suggesting right atrial pressure of 3 mmHg.  IAS/Shunts: No atrial level shunt detected by color flow Doppler.   LEFT VENTRICLE PLAX 2D LVIDd:         5.80 cm   Diastology LVIDs:         3.60 cm   LV e' medial:    7.83 cm/s LV PW:         1.10 cm   LV E/e' medial:  10.6 LV IVS:        1.30 cm   LV e' lateral:   11.50 cm/s LVOT diam:     2.30 cm   LV E/e' lateral: 7.2 LV SV:         111 LV SV Index:   43 LVOT Area:     4.15 cm   RIGHT VENTRICLE             IVC RV S prime:     19.40 cm/s  IVC diam: 1.30 cm TAPSE (M-mode): 2.8 cm  LEFT ATRIUM           Index        RIGHT ATRIUM           Index LA diam:      4.40 cm 1.72 cm/m   RA Area:     17.00 cm LA Vol (A2C): 67.0 ml 26.19 ml/m  RA Volume:   46.35 ml  18.13 ml/m LA Vol (A4C): 59.5 ml 23.28 ml/m AORTIC VALVE AV Area (Vmax):    3.27 cm AV Area (Vmean):   3.22 cm AV Area (VTI):     3.39 cm AV Vmax:           163.00 cm/s AV Vmean:          109.000 cm/s AV VTI:            0.327 m AV Peak Grad:      10.6 mmHg AV Mean Grad:      6.0 mmHg LVOT Vmax:         128.33 cm/s LVOT Vmean:        84.600 cm/s LVOT VTI:          0.267 m LVOT/AV VTI ratio: 0.82  AORTA Ao Asc diam: 3.80 cm  MITRAL VALVE MV Area  (PHT): 3.85 cm    SHUNTS MV Decel Time: 197 msec    Systemic VTI:  0.27 m MV E velocity: 82.90 cm/s  Systemic Diam: 2.30 cm MV A velocity: 62.60 cm/s MV E/A ratio:  1.32  Mihai Croitoru MD Electronically signed by Thurmon Fair MD Signature Date/Time: 07/14/2022/12:14:47 PM    Final              Recent Labs: 08/05/2022: TSH 2.322 09/06/2022:  ALT 20 09/14/2022: B Natriuretic Peptide 6.2; BUN 17; Creatinine, Ser 1.17; Hemoglobin 16.6; Platelets 223; Potassium 3.9; Sodium 139  Recent Lipid Panel    Component Value Date/Time   CHOL 87 07/13/2022 0238   CHOL 96 (L) 03/22/2022 0856   TRIG 237 (H) 07/13/2022 0238   HDL 28 (L) 07/13/2022 0238   HDL 32 (L) 03/22/2022 0856   CHOLHDL 3.1 07/13/2022 0238   VLDL 47 (H) 07/13/2022 0238   LDLCALC 12 07/13/2022 0238   LDLCALC 37 03/22/2022 0856        Physical Exam:    VS:  BP 122/80   Pulse 78   Ht 6\' 1"  (1.854 m)   Wt (!) 302 lb (137 kg)   SpO2 95%   BMI 39.84 kg/m     Wt Readings from Last 3 Encounters:  09/19/22 (!) 302 lb (137 kg)  09/14/22 (!) 307 lb 6.4 oz (139.4 kg)  08/16/22 (!) 312 lb 2.7 oz (141.6 kg)     GEN: NAD, morbid obesity HEENT: Normal NECK: No JVD CARDIAC: RRR, no murmurs, rubs, gallops RESPIRATORY:  Clear to auscultation without rales, wheezing or rhonchi  ABDOMEN: Soft, non-tender, non-distended MUSCULOSKELETAL:  No edema; No deformity  SKIN: Warm and dry NEUROLOGIC:  Alert and oriented x 3 PSYCHIATRIC:  Normal affect   ASSESSMENT:    1. Paroxysmal atrial fibrillation (HCC)   2. Primary hypertension   3. Chronic systolic heart failure (HCC)   4. Coronary artery disease involving native coronary artery of native heart with unstable angina pectoris (HCC)   5. Mixed hyperlipidemia   6. Morbid obesity (HCC)   7. OSA (obstructive sleep apnea)   8. Essential hypertension   9. CAD S/P percutaneous coronary angioplasty    PLAN:     Chronic Systolic Heart Failure  Heart Failure Recovered  Ejection Fraction (ombined systolic and diastolic) - chronic - NYHA class I, Stage C, euvolemic - Diuretic regimen: Lasix 20 mg as a PRN with 20 KCL PRN.  - Continue Entresto 97-103 mg bid. - Continue Farxiga 10 mg daily.  - Continue spiro 25 mg daily.  - Continue carvedilol 6.25 mg bid.  - on Imdur 45 mg; if BP elevated increase this   Coronary Artery Disease; Obstructive - symptomatic with  stable angina on therapy  - Continue Plavix + Xarelto; with plavix stop in 01/2023 - continue statin, goal LDL < 55 - Continue ? blocker + statin. - continue PRN nitro - if worsening chest ache and BP ok get PET MPI  PAF - Maintaining NSR post DCCV. - stopping Amiodarone; will get heart monitor in July; did counseling on re-occurence- would restart and then get AF ablation - Continue Xarelto. - Continue CPAP   Morbid Obesity - He has met with a nutritionist and is working on weight loss - On GLP1RA, would restart  OSA - Continue CPAP.  HTN - improved with increased doxazosin to 2 mg qhs from HF APP - Home BP cuff correlates with clinic readings. - discussed exercise changes  Gout - Followed by Dr. Dierdre Forth ([facility]).  Has f/u with Dr. Teressa Lower in September Can see me this winter with me if needed       Medication Adjustments/Labs and Tests Ordered: Current medicines are reviewed at length with the patient today.  Concerns regarding medicines are outlined above.  Orders Placed This Encounter  Procedures   LONG TERM MONITOR (3-14 DAYS)   No orders of the defined types were placed in this encounter.  Patient Instructions  Medication Instructions:  Your physician has recommended you make the following change in your medication:  STOP: amiodarone  *If you need a refill on your cardiac medications before your next appointment, please call your pharmacy*   Lab Work: NONE  If you have labs (blood work) drawn today and your tests are completely normal, you will receive  your results only by: MyChart Message (if you have MyChart) OR A paper copy in the mail If you have any lab test that is abnormal or we need to change your treatment, we will call you to review the results.   Testing/Procedures: Your physician has requested that you wear a heart monitor in July.   Follow-Up: At [facility], you and your health needs are our priority.  As part of our continuing mission to provide you with exceptional heart care, we have created designated Provider Care Teams.  These Care Teams include your primary Cardiologist (physician) and Advanced Practice Providers (APPs -  Physician Assistants and Nurse Practitioners) who all work together to provide you with the care you need, when you need it.    Your next appointment:   9 month(s)  Provider:   Riley Lam, MD  Other Instructions Christena Deem- Long Term Monitor Instructions  Your physician has requested you wear a ZIO patch monitor for 14 days.  This is a single patch monitor. [employer] supplies one patch monitor per enrollment. Additional stickers are not available. Please do not apply patch if you will be having a Nuclear Stress Test,  Cardiac CT, MRI, or Chest Xray during the period you would be wearing the  monitor. The patch cannot be worn during these tests. You cannot remove and re-apply the  ZIO XT patch monitor.  Your ZIO patch monitor will be mailed 3 day USPS to your address on file. It may take 3-5 days  to receive your monitor after you have been enrolled.  Once you have received your monitor, please review the enclosed instructions. Your monitor  has already been registered assigning a specific monitor serial # to you.  Billing and Patient Assistance Program Information  We have supplied [employer] with any of your insurance information on file for billing purposes. [employer] offers a sliding scale Patient Assistance Program for patients that do not have  insurance, or whose insurance  does not completely cover the cost of the ZIO monitor.  You must apply for the Patient Assistance Program to qualify for this discounted rate.  To apply, please call [employer] at [phone], select option 4, select option 2, ask to apply for  Patient Assistance Program. Meredeth Ide will ask your household income, and how many people  are in your household. They will quote your out-of-pocket cost based on that information.  [employer] will also be able to set up a 50-month, interest-free payment plan if needed.  Applying the monitor   Shave hair from upper left chest.  Hold abrader disc by orange tab. Rub abrader in 40 strokes over the upper left chest as  indicated in your monitor instructions.  Clean area with 4 enclosed alcohol pads. Let dry.  Apply patch as indicated in monitor instructions. Patch will be placed under collarbone on left  side of chest with arrow pointing upward.  Rub patch adhesive wings for 2 minutes. Remove white label marked "1". Remove the white  label marked "2". Rub patch adhesive wings for 2 additional minutes.  While looking in a mirror, press and release button in center  of patch. A small green light will  flash 3-4 times. This will be your only indicator that the monitor has been turned on.  Do not shower for the first 24 hours. You may shower after the first 24 hours.  Press the button if you feel a symptom. You will hear a small click. Record Date, Time and  Symptom in the Patient Logbook.  When you are ready to remove the patch, follow instructions on the last 2 pages of Patient  Logbook. Stick patch monitor onto the last page of Patient Logbook.  Place Patient Logbook in the blue and white box. Use locking tab on box and tape box closed  securely. The blue and white box has prepaid postage on it. Please place it in the mailbox as  soon as possible. Your physician should have your test results approximately 7 days after the  monitor has been mailed back to  [facility].  Call [facility] Customer Care at [phone] if you have questions regarding  your ZIO XT patch monitor. Call them immediately if you see an orange light blinking on your  monitor.  If your monitor falls off in less than 4 days, contact our Monitor department at [phone].  If your monitor becomes loose or falls off after 4 days call [employer] at [phone] for  suggestions on securing your monitor     Signed, Christell Constant, MD  09/19/2022 9:06 AM    [facility]

## 2022-09-19 NOTE — Progress Notes (Unsigned)
Enrolled for [employer] to mail a ZIO XT long term holter monitor to the patients address on file.  

## 2022-09-19 NOTE — Patient Instructions (Signed)
Medication Alexander:  Your physician has recommended you make the following change in your medication:  STOP: amiodarone  *If you need a refill on your cardiac medications before your next appointment, please call your pharmacy*   Lab Work: NONE  If you have labs (blood work) drawn today and your tests are completely normal, you will receive your results only by: MyChart Message (if you have MyChart) OR A paper copy in the mail If you have any lab test that is abnormal or we need to change your treatment, we will call you to review the results.   Testing/Procedures: Your physician has requested that you wear a heart monitor in July.   Follow-Up: At [facility], you and your health needs are our priority.  As part of our continuing mission to provide you with exceptional heart care, we have created designated Provider Care Teams.  These Care Teams include your primary Cardiologist (physician) and Advanced Practice Providers (APPs -  Physician Assistants and Nurse Practitioners) who all work together to provide you with the care you need, when you need it.    Your next appointment:   9 month(s)  Provider:   Riley Lam, MD  Other Alexander Alexander  Your physician has requested you wear a ZIO patch monitor for 14 days.  This is a single patch monitor. [employer] supplies one patch monitor per enrollment. Additional stickers are not available. Please do not apply patch if you will be having a Nuclear Stress Test,  Cardiac CT, MRI, or Chest Xray during the period you would be wearing the  monitor. The patch cannot be worn during these tests. You cannot remove and re-apply the  ZIO XT patch monitor.  Your ZIO patch monitor will be mailed 3 day USPS to your address on file. It may take 3-5 days  to receive your monitor after you have been enrolled.  Once you have received your monitor, please review the enclosed Alexander. Your  monitor  has already been registered assigning a specific monitor serial # to you.  Billing and Patient Assistance Program Information  We have supplied [employer] with any of your insurance information on file for billing purposes. [employer] offers a sliding scale Patient Assistance Program for patients that do not have  insurance, or whose insurance does not completely cover the cost of the ZIO monitor.  You must apply for the Patient Assistance Program to qualify for this discounted rate.  To apply, please call [employer] at [phone], select option 4, select option 2, ask to apply for  Patient Assistance Program. Alexander Alexander will ask your household income, and how many people  are in your household. They will quote your out-of-pocket cost based on that information.  [employer] will also be able to set up a 65-month, interest-free payment plan if needed.  Applying the monitor   Shave hair from upper left chest.  Hold abrader disc by orange tab. Rub abrader in 40 strokes over the upper left chest as  indicated in your monitor Alexander.  Clean area with 4 enclosed alcohol pads. Let dry.  Apply patch as indicated in monitor Alexander. Patch will be placed under collarbone on left  side of chest with arrow pointing upward.  Rub patch adhesive wings for 2 minutes. Remove white label marked "1". Remove the white  label marked "2". Rub patch adhesive wings for 2 additional minutes.  While looking in a mirror, press and release button in center of patch. A small  green light will  flash 3-4 times. This will be your only indicator that the monitor has been turned on.  Do not shower for the first 24 hours. You may shower after the first 24 hours.  Press the button if you feel a symptom. You will hear a small click. Record Date, Time and  Symptom in the Patient Logbook.  When you are ready to remove the patch, follow Alexander on the last 2 pages of Patient  Logbook. Stick patch monitor onto the  last page of Patient Logbook.  Place Patient Logbook in the blue and white box. Use locking tab on box and tape box closed  securely. The blue and white box has prepaid postage on it. Please place it in the mailbox as  soon as possible. Your physician should have your test results approximately 7 days after the  monitor has been mailed back to [facility].  Call [facility] Customer Care at [phone] if you have questions regarding  your ZIO XT patch monitor. Call them immediately if you see an orange light blinking on your  monitor.  If your monitor falls off in less than 4 days, contact our Monitor department at [phone].  If your monitor becomes loose or falls off after 4 days call [employer] at [phone] for  suggestions on securing your monitor

## 2022-09-21 ENCOUNTER — Encounter (HOSPITAL_COMMUNITY)
Admission: RE | Admit: 2022-09-21 | Discharge: 2022-09-21 | Disposition: A | Discharge status: Home / Self Care | Payer: BC Managed Care – PPO | Source: Ambulatory Visit | Attending: Internal Medicine | Admitting: Internal Medicine

## 2022-09-21 ENCOUNTER — Other Ambulatory Visit (HOSPITAL_COMMUNITY): Payer: Self-pay | Admitting: Internal Medicine

## 2022-09-21 DIAGNOSIS — Z955 Presence of coronary angioplasty implant and graft: Secondary | ICD-10-CM | POA: Diagnosis not present

## 2022-09-21 DIAGNOSIS — I5042 Chronic combined systolic (congestive) and diastolic (congestive) heart failure: Secondary | ICD-10-CM | POA: Diagnosis not present

## 2022-09-21 DIAGNOSIS — I5041 Acute combined systolic (congestive) and diastolic (congestive) heart failure: Secondary | ICD-10-CM | POA: Diagnosis not present

## 2022-09-23 ENCOUNTER — Encounter (HOSPITAL_COMMUNITY)
Admission: RE | Admit: 2022-09-23 | Discharge: 2022-09-23 | Disposition: A | Discharge status: Home / Self Care | Payer: BC Managed Care – PPO | Source: Ambulatory Visit | Attending: Internal Medicine | Admitting: Internal Medicine

## 2022-09-23 DIAGNOSIS — I5042 Chronic combined systolic (congestive) and diastolic (congestive) heart failure: Secondary | ICD-10-CM

## 2022-09-23 DIAGNOSIS — Z955 Presence of coronary angioplasty implant and graft: Secondary | ICD-10-CM

## 2022-09-23 DIAGNOSIS — I5041 Acute combined systolic (congestive) and diastolic (congestive) heart failure: Secondary | ICD-10-CM

## 2022-09-26 ENCOUNTER — Encounter (HOSPITAL_COMMUNITY): Payer: BC Managed Care – PPO

## 2022-09-28 ENCOUNTER — Encounter (HOSPITAL_COMMUNITY)
Admission: RE | Admit: 2022-09-28 | Discharge: 2022-09-28 | Disposition: A | Discharge status: Home / Self Care | Payer: BC Managed Care – PPO | Source: Ambulatory Visit | Attending: Internal Medicine | Admitting: Internal Medicine

## 2022-09-28 DIAGNOSIS — I5041 Acute combined systolic (congestive) and diastolic (congestive) heart failure: Secondary | ICD-10-CM | POA: Diagnosis not present

## 2022-09-28 DIAGNOSIS — I5042 Chronic combined systolic (congestive) and diastolic (congestive) heart failure: Secondary | ICD-10-CM | POA: Diagnosis not present

## 2022-09-28 DIAGNOSIS — Z955 Presence of coronary angioplasty implant and graft: Secondary | ICD-10-CM

## 2022-09-30 ENCOUNTER — Encounter (HOSPITAL_COMMUNITY): Payer: BC Managed Care – PPO

## 2022-10-03 ENCOUNTER — Other Ambulatory Visit (HOSPITAL_COMMUNITY): Payer: Self-pay | Admitting: Cardiology

## 2022-10-04 ENCOUNTER — Other Ambulatory Visit (HOSPITAL_COMMUNITY): Payer: Self-pay | Admitting: Cardiology

## 2022-10-05 ENCOUNTER — Telehealth (HOSPITAL_COMMUNITY): Payer: Self-pay | Admitting: *Deleted

## 2022-10-05 ENCOUNTER — Encounter (HOSPITAL_COMMUNITY): Payer: BC Managed Care – PPO

## 2022-10-05 NOTE — Telephone Encounter (Signed)
This is a CHF pt, Dr. Mayford Knife sees pt for sleep. Please address

## 2022-10-06 MED ORDER — DAPAGLIFLOZIN PROPANEDIOL 10 MG PO TABS: 10.0000 mg | ORAL_TABLET | Freq: Every day | ORAL | 3 refills | Status: DC

## 2022-10-07 ENCOUNTER — Encounter (HOSPITAL_COMMUNITY)
Admission: RE | Admit: 2022-10-07 | Discharge: 2022-10-07 | Disposition: A | Discharge status: Home / Self Care | Payer: BC Managed Care – PPO | Source: Ambulatory Visit | Attending: Internal Medicine | Admitting: Internal Medicine

## 2022-10-07 DIAGNOSIS — I5042 Chronic combined systolic (congestive) and diastolic (congestive) heart failure: Secondary | ICD-10-CM

## 2022-10-07 DIAGNOSIS — I5041 Acute combined systolic (congestive) and diastolic (congestive) heart failure: Secondary | ICD-10-CM

## 2022-10-07 DIAGNOSIS — Z955 Presence of coronary angioplasty implant and graft: Secondary | ICD-10-CM

## 2022-10-10 ENCOUNTER — Encounter (HOSPITAL_COMMUNITY)
Admission: RE | Admit: 2022-10-10 | Discharge: 2022-10-10 | Disposition: A | Discharge status: Home / Self Care | Payer: BC Managed Care – PPO | Source: Ambulatory Visit | Attending: Internal Medicine | Admitting: Internal Medicine

## 2022-10-10 DIAGNOSIS — Z955 Presence of coronary angioplasty implant and graft: Secondary | ICD-10-CM | POA: Insufficient documentation

## 2022-10-10 DIAGNOSIS — I5041 Acute combined systolic (congestive) and diastolic (congestive) heart failure: Secondary | ICD-10-CM | POA: Diagnosis not present

## 2022-10-10 DIAGNOSIS — I5042 Chronic combined systolic (congestive) and diastolic (congestive) heart failure: Secondary | ICD-10-CM | POA: Insufficient documentation

## 2022-10-11 NOTE — Progress Notes (Signed)
Cardiac Individual Treatment Plan  Patient Details  Name: Mantas Donia MRN: [id number] Date of Birth: 27-Apr-1976 Referring Provider:   Flowsheet Row INTENSIVE CARDIAC REHAB ORIENT from 08/16/2022 in [facility]  Referring Provider Arvilla Meres, MD       Initial Encounter Date:  Flowsheet Row INTENSIVE CARDIAC REHAB ORIENT from 08/16/2022 in [facility]  Date 08/16/22       Visit Diagnosis: Heart failure, systolic and diastolic, chronic (HCC)  Patient's Home Medications on Admission:  Current Outpatient Medications:    acetaminophen (TYLENOL) 500 MG tablet, Take 500 mg by mouth every 6 (six) hours as needed for moderate pain., Disp: , Rfl:    allopurinol (ZYLOPRIM) 100 MG tablet, Take 100 mg by mouth daily., Disp: , Rfl:    buPROPion (WELLBUTRIN XL) 300 MG 24 hr tablet, Take 300 mg by mouth daily., Disp: , Rfl:    carvedilol (COREG) 6.25 MG tablet, TAKE 1 TABLET BY MOUTH 2 TIMES DAILY WITH A MEAL., Disp: 180 tablet, Rfl: 3   clonazePAM (KLONOPIN) 0.5 MG tablet, Take 0.5 mg by mouth 2 (two) times daily as needed for anxiety., Disp: , Rfl:    clopidogrel (PLAVIX) 75 MG tablet, Take 1 tablet (75 mg total) by mouth daily., Disp: 90 tablet, Rfl: 3   colchicine 0.6 MG tablet, Take 0.6 mg by mouth 2 (two) times daily as needed (gout)., Disp: , Rfl:    dapagliflozin propanediol (FARXIGA) 10 MG TABS tablet, Take 1 tablet (10 mg total) by mouth daily., Disp: 90 tablet, Rfl: 3   doxazosin (CARDURA) 2 MG tablet, Take 1 tablet (2 mg total) by mouth at bedtime., Disp: 30 tablet, Rfl: 11   ENTRESTO 97-103 MG, TAKE 1 TABLET BY MOUTH TWICE A DAY, Disp: 60 tablet, Rfl: 1   fenofibrate (TRICOR) 145 MG tablet, Take 145 mg by mouth daily., Disp: , Rfl:    furosemide (LASIX) 20 MG tablet, TAKE 1 TABLET BY MOUTH AS NEEDED FOR FLUID OR EDEMA, Disp: 30 tablet, Rfl: 5   isosorbide  mononitrate (IMDUR) 30 MG 24 hr tablet, Take 1.5 tablets (45 mg total) by mouth daily., Disp: 105 tablet, Rfl: 11   Multiple Vitamin (MULTIVITAMIN WITH MINERALS) TABS tablet, Take 1 tablet by mouth daily., Disp: , Rfl:    nitroGLYCERIN (NITROSTAT) 0.4 MG SL tablet, Place 1 tablet (0.4 mg total) under the tongue every 5 (five) minutes as needed for chest pain., Disp: 25 tablet, Rfl: 3   potassium chloride SA (KLOR-CON) 20 MEQ tablet, Take 1 tablet (20 mEq total) by mouth as needed (with Lasix)., Disp: 90 tablet, Rfl: 3   rosuvastatin (CRESTOR) 40 MG tablet, TAKE 1 TABLET BY MOUTH EVERY DAY, Disp: 90 tablet, Rfl: 1   Semaglutide-Weight Management (WEGOVY) 1 MG/0.5ML SOAJ, Inject 1 mg into the skin once a week., Disp: 2 mL, Rfl: 11   spironolactone (ALDACTONE) 25 MG tablet, TAKE 1 TABLET (25 MG TOTAL) BY MOUTH DAILY., Disp: 30 tablet, Rfl: 6   traMADol (ULTRAM) 50 MG tablet, Take 50 mg by mouth 3 (three) times daily as needed for moderate pain., Disp: , Rfl:    XARELTO 20 MG TABS tablet, TAKE 1 TABLET BY MOUTH EVERY DAY, Disp: 30 tablet, Rfl: 5  Past Medical History: Past Medical History:  Diagnosis Date   Arrhythmia    Atrial fibrillation (HCC)    CAD (coronary artery disease) 08/17/2020   PCI + DES  to pLCx, 70% mLAD negative FFR testing (medical therapy)    CHF (congestive heart failure) (HCC)    Chronic systolic heart failure (HCC)    Gout    Hyperlipidemia    Hypertension    OSA (obstructive sleep apnea) 09/2020    Tobacco Use: Social History   Tobacco Use  Smoking Status Former   Types: Cigars   Quit date: 08/14/2020   Years since quitting: 2.1  Smokeless Tobacco Never    Labs: Review Flowsheet  More data exists      Latest Ref Rng & Units 05/18/2020 08/17/2020 06/24/2021 03/22/2022 07/13/2022  Labs for ITP Cardiac and Pulmonary Rehab  Cholestrol 0 - 200 mg/dL - - 409  96  87   LDL (calc) 0 - 99 mg/dL - - 60  37  12   HDL-C >40 mg/dL - - 34  32  28   Trlycerides <150 mg/dL - -  811  914  782   Hemoglobin A1c 4.8 - 5.6 % - - 6.0  5.5  5.6   PH, Arterial 7.350 - 7.450 - 7.400  - - -  PCO2 arterial 32.0 - 48.0 mmHg - 38.4  - - -  Bicarbonate 20.0 - 28.0 mmol/L - 23.8  - - -  TCO2 22 - 32 mmol/L - 25  - - -  Acid-base deficit 0.0 - 2.0 mmol/L - 1.0  - - -  O2 Saturation % 59.6  99.0  - - -    Capillary Blood Glucose: No results found for: "GLUCAP"   Exercise Target Goals: Exercise Program Goal: Individual exercise prescription set using results from initial 6 min walk test and THRR while considering  patient's activity barriers and safety.   Exercise Prescription Goal: Initial exercise prescription builds to 30-45 minutes a day of aerobic activity, 2-3 days per week.  Home exercise guidelines will be given to patient during program as part of exercise prescription that the participant will acknowledge.  Activity Barriers & Risk Stratification:  Activity Barriers & Cardiac Risk Stratification - 08/16/22 0953       Activity Barriers & Cardiac Risk Stratification   Activity Barriers Arthritis;Joint Problems    Cardiac Risk Stratification High             6 Minute Walk:  6 Minute Walk     Row Name 08/16/22 0908         6 Minute Walk   Phase Initial     Distance 1726 feet     Walk Time 6 minutes     # of Rest Breaks 0     MPH 3.3     METS 4     RPE 9     Perceived Dyspnea  0     VO2 Peak 14     Symptoms Yes (comment)     Comments Low back tightness, no pain     Resting HR 64 bpm     Resting BP 124/86     Resting Oxygen Saturation  95 %     Exercise Oxygen Saturation  during 6 min walk 95 %     Max Ex. HR 87 bpm     Max Ex. BP 128/84     2 Minute Post BP 110/80              Oxygen Initial Assessment:   Oxygen Re-Evaluation:   Oxygen Discharge (Final Oxygen Re-Evaluation):   Initial Exercise Prescription:  Initial Exercise Prescription - 08/16/22 0900  Date of Initial Exercise RX and Referring Provider   Date  08/16/22    Referring Provider Arvilla Meres, MD    Expected Discharge Date 10/28/22      Recumbant Bike   Level 2.5    RPM 60    Watts 50    Minutes 15    METs 4      Recumbant Elliptical   Level 3    RPM 60    Watts 25    Minutes 15    METs 4      Prescription Details   Frequency (times per week) 3    Duration Progress to 30 minutes of continuous aerobic without signs/symptoms of physical distress      Intensity   THRR 40-80% of Max Heartrate 70 - 139    Ratings of Perceived Exertion 11-13    Perceived Dyspnea 0-4      Progression   Progression Continue progressive overload as per policy without signs/symptoms or physical distress.      Resistance Training   Training Prescription Yes    Weight 5 lbs    Reps 10-15             Perform Capillary Blood Glucose checks as needed.  Exercise Prescription Changes:   Exercise Prescription Changes     Row Name 08/22/22 1700 09/09/22 1400 09/20/22 0800 10/07/22 1500       Response to Exercise   Blood Pressure (Admit) 124/70 138/86 108/70 132/80    Blood Pressure (Exercise) 140/70 138/80 132/88 138/78    Blood Pressure (Exit) 120/80 122/80 112/70 122/80    Heart Rate (Admit) 73 bpm 72 bpm 73 bpm 84 bpm    Heart Rate (Exercise) 93 bpm 96 bpm 104 bpm 106 bpm    Heart Rate (Exit) 81 bpm 81 bpm 63 bpm 72 bpm    Rating of Perceived Exertion (Exercise) 11 11 12 12     Symptoms None None None None    Comments Pt's first day in the [facility] program Reviewed METs Reviewed METs and goals Reviewed METs/ goals    Duration Progress to 30 minutes of  aerobic without signs/symptoms of physical distress Continue with 30 min of aerobic exercise without signs/symptoms of physical distress. Continue with 30 min of aerobic exercise without signs/symptoms of physical distress. Continue with 30 min of aerobic exercise without signs/symptoms of physical distress.    Intensity THRR unchanged THRR unchanged THRR unchanged THRR unchanged       Progression   Progression Continue to progress workloads to maintain intensity without signs/symptoms of physical distress. Continue to progress workloads to maintain intensity without signs/symptoms of physical distress. Continue to progress workloads to maintain intensity without signs/symptoms of physical distress. Continue to progress workloads to maintain intensity without signs/symptoms of physical distress.    Average METs 2.6 2.3 3.35 3.85      Resistance Training   Training Prescription Yes Yes Yes Yes    Weight 5 lbs 5 lbs 5 lbs 5 lbs    Reps 10-15 10-15 10-15 10-15    Time 10 Minutes 10 Minutes 10 Minutes 10 Minutes      Interval Training   Interval Training No No No No      Recumbant Bike   Level 2.5 2.5 3 3     RPM 77 -- 91 --    Watts 47 -- 80 --    Minutes 15 15 15 15     METs 3.2 2.4 3.1 3.9      Recumbant  Elliptical   Level 3 3 3 4     RPM 70 60 85 83    Watts 90 86 124 133    Minutes 15 15 -- 15    METs 2 2.1 3.6 --             Exercise Comments:   Exercise Comments     Row Name 08/22/22 1716 09/19/22 1500 10/10/22 0851       Exercise Comments Pt's first day in the [facility] program. Pt had no complaints with todays session and is off to a good start. Reviewed METs and goals. Pt is making progress in creasing MET level. Pt is making progress on his goals. Pt voices he is eating more vegtables and snacking less. Pt achieved a weight loss of 4.4 kg since starting the program. Reviewed METs and goals. Pt is making progress. Encouraged to continue on his weight loss goals.              Exercise Goals and Review:   Exercise Goals     Row Name 08/16/22 0810             Exercise Goals   Increase Physical Activity Yes       Intervention Provide advice, education, support and counseling about physical activity/exercise needs.;Develop an individualized exercise prescription for aerobic and resistive training based on initial evaluation findings, risk  stratification, comorbidities and participant's personal goals.       Expected Outcomes Short Term: Attend rehab on a regular basis to increase amount of physical activity.;Long Term: Add in home exercise to make exercise part of routine and to increase amount of physical activity.;Long Term: Exercising regularly at least 3-5 days a week.       Increase Strength and Stamina Yes       Intervention Provide advice, education, support and counseling about physical activity/exercise needs.;Develop an individualized exercise prescription for aerobic and resistive training based on initial evaluation findings, risk stratification, comorbidities and participant's personal goals.       Expected Outcomes Short Term: Increase workloads from initial exercise prescription for resistance, speed, and METs.;Short Term: Perform resistance training exercises routinely during rehab and add in resistance training at home;Long Term: Improve cardiorespiratory fitness, muscular endurance and strength as measured by increased METs and functional capacity ( )       Able to understand and use rate of perceived exertion (RPE) scale Yes       Intervention Provide education and explanation on how to use RPE scale       Expected Outcomes Short Term: Able to use RPE daily in rehab to express subjective intensity level;Long Term:  Able to use RPE to guide intensity level when exercising independently       Knowledge and understanding of Target Heart Rate Range (THRR) Yes       Intervention Provide education and explanation of THRR including how the numbers were predicted and where they are located for reference       Expected Outcomes Short Term: Able to state/look up THRR;Long Term: Able to use THRR to govern intensity when exercising independently;Short Term: Able to use daily as guideline for intensity in rehab       Understanding of Exercise Prescription Yes       Intervention Provide education, explanation, and written  materials on patient's individual exercise prescription       Expected Outcomes Short Term: Able to explain program exercise prescription;Long Term: Able to explain home exercise prescription to exercise independently  Exercise Goals Re-Evaluation :  Exercise Goals Re-Evaluation     Row Name 08/22/22 1715 09/19/22 1500 10/10/22 0849         Exercise Goal Re-Evaluation   Exercise Goals Review Increase Physical Activity;Increase Strength and Stamina;Able to understand and use rate of perceived exertion (RPE) scale;Knowledge and understanding of Target Heart Rate Range (THRR);Understanding of Exercise Prescription Increase Physical Activity;Increase Strength and Stamina;Able to understand and use rate of perceived exertion (RPE) scale;Knowledge and understanding of Target Heart Rate Range (THRR);Understanding of Exercise Prescription Increase Physical Activity;Increase Strength and Stamina;Able to understand and use rate of perceived exertion (RPE) scale;Knowledge and understanding of Target Heart Rate Range (THRR);Understanding of Exercise Prescription     Comments Pt's first day in the [facility] program. Pt understands the exercise Rx, RPE scale and THRR. Reviewed METs and goals. Pt is making progress with peak METs of 3.6. Pt voices he sees an increase in his strength and stamina. Pt voices that he has made improvements in his diet, which is a goal, Pt also has goal of weight loss Reviewed METs and goals. Pt is making progress with peak METs of 4.0  Pt voices he sees an increase in his strength and stamina and states he is much more active now.  Pt voices that he has made improvements in his diet, especially in regards to portion control and binge eating.  P     Expected Outcomes Will continue to monitor patient and progress exercise workloads as tolerated. Will continue to monitor patient and progress exercise workloads as tolerated. Will continue to monitor patient and progress exercise  workloads as tolerated.              Discharge Exercise Prescription (Final Exercise Prescription Changes):  Exercise Prescription Changes - 10/07/22 1500       Response to Exercise   Blood Pressure (Admit) 132/80    Blood Pressure (Exercise) 138/78    Blood Pressure (Exit) 122/80    Heart Rate (Admit) 84 bpm    Heart Rate (Exercise) 106 bpm    Heart Rate (Exit) 72 bpm    Rating of Perceived Exertion (Exercise) 12    Symptoms None    Comments Reviewed METs/ goals    Duration Continue with 30 min of aerobic exercise without signs/symptoms of physical distress.    Intensity THRR unchanged      Progression   Progression Continue to progress workloads to maintain intensity without signs/symptoms of physical distress.    Average METs 3.85      Resistance Training   Training Prescription Yes    Weight 5 lbs    Reps 10-15    Time 10 Minutes      Interval Training   Interval Training No      Recumbant Bike   Level 3    Minutes 15    METs 3.9      Recumbant Elliptical   Level 4    RPM 83    Watts 133    Minutes 15             Nutrition:  Target Goals: Understanding of nutrition guidelines, daily intake of sodium 1500mg , cholesterol 200mg , calories 30% from fat and 7% or less from saturated fats, daily to have 5 or more servings of fruits and vegetables.  Biometrics:  Pre Biometrics - 08/16/22 0815       Pre Biometrics   Waist Circumference 54 inches    Hip Circumference 49.5 inches    Waist to Hip  Ratio 1.09 %    Triceps Skinfold 15 mm    % Body Fat 37.5 %    Grip Strength 54 kg    Flexibility 0 in   Cannot reach   Single Leg Stand 22.68 seconds              Nutrition Therapy Plan and Nutrition Goals:  Nutrition Therapy & Goals - 09/23/22 1327       Nutrition Therapy   Diet Heart healthy diet    Drug/Food Interactions Statins/Certain Fruits      Personal Nutrition Goals   Nutrition Goal Patient to identify strategies for reducing  cardiovascular risk by attending the [employer] education and nutrition series weekly.    Personal Goal #2 Patient to improve diet quality by using the plate method as a guide for meal planning to include lean protein/plant protein, fruits, vegetables, whole grains, nonfat dairy as part of a well-balanced diet.    Personal Goal #3 Patient to identify strategies for weight loss of 0.5-2.0# per week.    Comments Goals in action. Callaghan continues to attend the [employer] and nutrition series regularly. Darron is taking Wegovy to aid with weight loss; he is down 9.2# since starting with our program. He has previously completed cardiac rehab in the past. He will benefit from participation in intensive cardiac rehab for nutrition, exercise, and lifestyle modification.      Intervention Plan   Intervention Prescribe, educate and counsel regarding individualized specific dietary modifications aiming towards targeted core components such as weight, hypertension, lipid management, diabetes, heart failure and other comorbidities.;Nutrition handout(s) given to patient.    Expected Outcomes Short Term Goal: Understand basic principles of dietary content, such as calories, fat, sodium, cholesterol and nutrients.;Long Term Goal: Adherence to prescribed nutrition plan.             Nutrition Assessments:  Nutrition Assessments - 08/16/22 1152       Rate Your Plate Scores   Pre Score 55            MEDIFICTS Score Key: ?70 Need to make dietary changes  40-70 Heart Healthy Diet ? 40 Therapeutic Level Cholesterol Diet   Flowsheet Row INTENSIVE CARDIAC REHAB ORIENT from 08/16/2022 in [facility]  Picture Your Plate Total Score on Admission 55      Picture Your Plate Scores: <16 Unhealthy dietary pattern with much room for improvement. 41-50 Dietary pattern unlikely to meet recommendations for good health and room for improvement. 51-60  More healthful dietary pattern, with some room for improvement.  >60 Healthy dietary pattern, although there may be some specific behaviors that could be improved.    Nutrition Goals Re-Evaluation:  Nutrition Goals Re-Evaluation     Row Name 08/25/22 0911 09/23/22 1327           Goals   Current Weight 310 lb 13.6 oz (141 kg) 302 lb 14.6 oz (137.4 kg)      Comment triglycerides 237, HDL 28, VLDL 47, A1c WNL no new labs; most recent labs triglycerides 237, HDL 28, VLDL 47, A1c WNL      Expected Outcome Hal is taking Wegovy to aid with weight loss. He has previously completed cardiac rehab in the past. He will benefit from participation in intensive cardiac rehab for nutrition, exercise, and lifestyle modification. Goals in action. Eulalio continues to attend the [employer] and nutrition series regularly. Zia is taking Wegovy to aid with weight loss; he  is down 9.2# since starting with our program. He has previously completed cardiac rehab in the past. He will benefit from participation in intensive cardiac rehab for nutrition, exercise, and lifestyle modification.               Nutrition Goals Re-Evaluation:  Nutrition Goals Re-Evaluation     Row Name 08/25/22 0911 09/23/22 1327           Goals   Current Weight 310 lb 13.6 oz (141 kg) 302 lb 14.6 oz (137.4 kg)      Comment triglycerides 237, HDL 28, VLDL 47, A1c WNL no new labs; most recent labs triglycerides 237, HDL 28, VLDL 47, A1c WNL      Expected Outcome Theodoric is taking Wegovy to aid with weight loss. He has previously completed cardiac rehab in the past. He will benefit from participation in intensive cardiac rehab for nutrition, exercise, and lifestyle modification. Goals in action. Maxden continues to attend the [employer] and nutrition series regularly. Kal is taking Wegovy to aid with weight loss; he is down 9.2# since starting with our program. He has previously completed cardiac rehab in the past.  He will benefit from participation in intensive cardiac rehab for nutrition, exercise, and lifestyle modification.               Nutrition Goals Discharge (Final Nutrition Goals Re-Evaluation):  Nutrition Goals Re-Evaluation - 09/23/22 1327       Goals   Current Weight 302 lb 14.6 oz (137.4 kg)    Comment no new labs; most recent labs triglycerides 237, HDL 28, VLDL 47, A1c WNL    Expected Outcome Goals in action. Fidelis continues to attend the [employer] and nutrition series regularly. Nicolaas is taking Wegovy to aid with weight loss; he is down 9.2# since starting with our program. He has previously completed cardiac rehab in the past. He will benefit from participation in intensive cardiac rehab for nutrition, exercise, and lifestyle modification.             Psychosocial: Target Goals: Acknowledge presence or absence of significant depression and/or stress, maximize coping skills, provide positive support system. Participant is able to verbalize types and ability to use techniques and skills needed for reducing stress and depression.  Initial Review & Psychosocial Screening:  Initial Psych Review & Screening - 08/16/22 0904       Initial Review   Current issues with History of Depression      Family Dynamics   Good Support System? Yes   Has fiance and 2 teenage children for support.     Barriers   Psychosocial barriers to participate in program There are no identifiable barriers or psychosocial needs.      Screening Interventions   Interventions Encouraged to exercise    Expected Outcomes Long Term Goal: Stressors or current issues are controlled or eliminated.             Quality of Life Scores:  Quality of Life - 08/16/22 1001       Quality of Life   Select Quality of Life      Quality of Life Scores   Health/Function Pre 23.47 %    Socioeconomic Pre 23.86 %    Psych/Spiritual Pre 18.86 %    Family Pre 28.3 %    GLOBAL Pre 23.31 %             Scores of 19 and below usually indicate a poorer quality of life in these  areas.  A difference of  2-3 points is a clinically meaningful difference.  A difference of 2-3 points in the total score of the Quality of Life Index has been associated with significant improvement in overall quality of life, self-image, physical symptoms, and general health in studies assessing change in quality of life.  PHQ-9: Review Flowsheet       08/16/2022 07/17/2020 02/24/2020  Depression screen PHQ 2/9  Decreased Interest 0 0 0  Down, Depressed, Hopeless 0 0 0  PHQ - 2 Score 0 0 0  Altered sleeping 0 - -  Tired, decreased energy 0 - -  Change in appetite 2 - -  Feeling bad or failure about yourself  0 - -  Trouble concentrating 1 - -  Moving slowly or fidgety/restless 0 - -  Suicidal thoughts 0 - -  PHQ-9 Score 3 - -  Difficult doing work/chores Not difficult at all - -   Interpretation of Total Score  Total Score Depression Severity:  1-4 = Minimal depression, 5-9 = Mild depression, 10-14 = Moderate depression, 15-19 = Moderately severe depression, 20-27 = Severe depression   Psychosocial Evaluation and Intervention:   Psychosocial Re-Evaluation:  Psychosocial Re-Evaluation     Row Name 08/23/22 1543 09/13/22 1758 10/11/22 1626         Psychosocial Re-Evaluation   Current issues with History of Depression History of Depression History of Depression     Comments Tabius did not voice any increased concerns or stressors on his first day of exercise at intensive cardiac rehab Aspen has not voiced  increased concerns or stressors during  exercise at intensive cardiac rehab Baylee has not voiced  increased concerns or stressors during  exercise at intensive cardiac rehab     Interventions Encouraged to attend Cardiac Rehabilitation for the exercise Encouraged to attend Cardiac Rehabilitation for the exercise Encouraged to attend Cardiac Rehabilitation for the exercise     Continue  Psychosocial Services  No Follow up required No Follow up required No Follow up required              Psychosocial Discharge (Final Psychosocial Re-Evaluation):  Psychosocial Re-Evaluation - 10/11/22 1626       Psychosocial Re-Evaluation   Current issues with History of Depression    Comments Tysun has not voiced  increased concerns or stressors during  exercise at intensive cardiac rehab    Interventions Encouraged to attend Cardiac Rehabilitation for the exercise    Continue Psychosocial Services  No Follow up required             Vocational Rehabilitation: Provide vocational rehab assistance to qualifying candidates.   Vocational Rehab Evaluation & Intervention:  Vocational Rehab - 08/16/22 0906       Initial Vocational Rehab Evaluation & Intervention   Assessment shows need for Vocational Rehabilitation No   Aidian works full time and does not need vocational rehab at this time.            Education: Education Goals: Education classes will be provided on a weekly basis, covering required topics. Participant will state understanding/return demonstration of topics presented.    Education     Row Name 08/22/22 1600     Education   Cardiac Education Topics [employer]   Geographical information systems officer Psychosocial   Psychosocial Workshop Focused Goals, Sustainable Changes   Instruction Review Code 1- Verbalizes Understanding   Class Start Time 1400  Class Stop Time 1441   Class Time Calculation (min) 41 min    Row Name 08/24/22 1500     Education   Cardiac Education Topics Pritikin   Orthoptist   Educator Dietitian   Weekly Topic One-Pot Wonders   Instruction Review Code 1- Verbalizes Understanding   Class Start Time 1400   Class Stop Time 1440   Class Time Calculation (min) 40 min    Row Name 08/31/22 1600     Education   Cardiac Education Topics Pritikin   Research scientist (life sciences)   Weekly Topic Comforting Weekend Breakfasts   Instruction Review Code 1- Verbalizes Understanding   Class Start Time 1355   Class Stop Time 1442   Class Time Calculation (min) 47 min    Row Name 09/09/22 1300     Education   Cardiac Education Topics Pritikin   Licensed conveyancer Nutrition   Nutrition Vitamins and Minerals   Instruction Review Code 1- Verbalizes Understanding    Row Name 09/12/22 1700     Education   Cardiac Education Topics Pritikin   [employer] Works[employer]     Workshops   Educator Exercise Physiologist   [facility] Psychosocial   Psychosocial Workshop Healthy Sleep for a Healthy Heart   Instruction Review Code 1- Verbalizes Understanding   Class Start Time 1405   Class Stop Time 1502   Class Time Calculation (min) 57 min    Row Name 09/21/22 1500     Education   Cardiac Education Topics Pritikin   Customer service manager   Weekly Topic Simple Sides and Sauces   Instruction Review Code 1- Verbalizes Understanding   Class Start Time 1350   Class Stop Time 1425   Class Time Calculation (min) 35 min    Row Name 09/28/22 1500     Education   Cardiac Education Topics Pritikin   Customer service manager   Weekly Topic Powerhouse Plant-Based Proteins   Instruction Review Code 1- Verbalizes Understanding   Class Start Time 1355   Class Stop Time 1436   Class Time Calculation (min) 41 min            Core Videos: Exercise    Move It!  Clinical staff conducted group or individual video education with verbal and written material and guidebook.  Patient learns the recommended Pritikin exercise program. Exercise with the goal of living a long, healthy life. Some of the health benefits of exercise include controlled diabetes, healthier blood pressure levels, improved  cholesterol levels, improved heart and lung capacity, improved sleep, and better body composition. Everyone should speak with their doctor before starting or changing an exercise routine.  Biomechanical Limitations Clinical staff conducted group or individual video education with verbal and written material and guidebook.  Patient learns how biomechanical limitations can impact exercise and how we can mitigate and possibly overcome limitations to have an impactful and balanced exercise routine.  Body Composition Clinical staff conducted group or individual video education with verbal and written material and guidebook.  Patient learns that body composition (ratio of muscle mass to fat mass) is a key component to assessing overall fitness, rather than body weight alone. Increased fat mass, especially visceral belly fat,  can put [place] at increased risk for metabolic syndrome, type 2 diabetes, heart disease, and even death. It is recommended to combine diet and exercise (cardiovascular and resistance training) to improve your body composition. Seek guidance from your physician and exercise physiologist before implementing an exercise routine.  Exercise Action Plan Clinical staff conducted group or individual video education with verbal and written material and guidebook.  Patient learns the recommended strategies to achieve and enjoy long-term exercise adherence, including variety, self-motivation, self-efficacy, and positive decision making. Benefits of exercise include fitness, good health, weight management, more energy, better sleep, less stress, and overall well-being.  Medical   Heart Disease Risk Reduction Clinical staff conducted group or individual video education with verbal and written material and guidebook.  Patient learns our heart is our most vital organ as it circulates oxygen, nutrients, white blood cells, and hormones throughout the entire body, and carries waste away. Data supports a  plant-based eating plan like the Pritikin Program for its effectiveness in slowing progression of and reversing heart disease. The video provides a number of recommendations to address heart disease.   Metabolic Syndrome and Belly Fat  Clinical staff conducted group or individual video education with verbal and written material and guidebook.  Patient learns what metabolic syndrome is, how it leads to heart disease, and how one can reverse it and keep it from coming back. You have metabolic syndrome if you have 3 of the following 5 criteria: abdominal obesity, high blood pressure, high triglycerides, low HDL cholesterol, and high blood sugar.  Hypertension and Heart Disease Clinical staff conducted group or individual video education with verbal and written material and guidebook.  Patient learns that high blood pressure, or hypertension, is very common in the [place]. Hypertension is largely due to excessive salt intake, but other important risk factors include being overweight, physical inactivity, drinking too much alcohol, smoking, and not eating enough potassium from fruits and vegetables. High blood pressure is a leading risk factor for heart attack, stroke, congestive heart failure, dementia, kidney failure, and premature death. Long-term effects of excessive salt intake include stiffening of the arteries and thickening of heart muscle and organ damage. Recommendations include ways to reduce hypertension and the risk of heart disease.  Diseases of Our Time - Focusing on Diabetes Clinical staff conducted group or individual video education with verbal and written material and guidebook.  Patient learns why the best way to stop diseases of our time is prevention, through food and other lifestyle changes. Medicine (such as prescription pills and surgeries) is often only a Band-Aid on the problem, not a long-term solution. Most common diseases of our time include obesity, type 2 diabetes,  hypertension, heart disease, and cancer. The Pritikin Program is recommended and has been proven to help reduce, reverse, and/or prevent the damaging effects of metabolic syndrome.  Nutrition   Overview of the Pritikin Eating Plan  Clinical staff conducted group or individual video education with verbal and written material and guidebook.  Patient learns about the Pritikin Eating Plan for disease risk reduction. The Pritikin Eating Plan emphasizes a wide variety of unrefined, minimally-processed carbohydrates, like fruits, vegetables, whole grains, and legumes. Go, Caution, and Stop food choices are explained. Plant-based and lean animal proteins are emphasized. Rationale provided for low sodium intake for blood pressure control, low added sugars for blood sugar stabilization, and low added fats and oils for coronary artery disease risk reduction and weight management.  Calorie Density  Clinical staff conducted group or individual  video education with verbal and written material and guidebook.  Patient learns about calorie density and how it impacts the Pritikin Eating Plan. Knowing the characteristics of the food you choose will help you decide whether those foods will lead to weight gain or weight loss, and whether you want to consume more or less of them. Weight loss is usually a side effect of the Pritikin Eating Plan because of its focus on low calorie-dense foods.  Label Reading  Clinical staff conducted group or individual video education with verbal and written material and guidebook.  Patient learns about the Pritikin recommended label reading guidelines and corresponding recommendations regarding calorie density, added sugars, sodium content, and whole grains.  Dining Out - Part 1  Clinical staff conducted group or individual video education with verbal and written material and guidebook.  Patient learns that restaurant meals can be sabotaging because they can be so high in calories, fat,  sodium, and/or sugar. Patient learns recommended strategies on how to positively address this and avoid unhealthy pitfalls.  Facts on Fats  Clinical staff conducted group or individual video education with verbal and written material and guidebook.  Patient learns that lifestyle modifications can be just as effective, if not more so, as many medications for lowering your risk of heart disease. A Pritikin lifestyle can help to reduce your risk of inflammation and atherosclerosis (cholesterol build-up, or plaque, in the artery walls). Lifestyle interventions such as dietary choices and physical activity address the cause of atherosclerosis. A review of the types of fats and their impact on blood cholesterol levels, along with dietary recommendations to reduce fat intake is also included.  Nutrition Action Plan  Clinical staff conducted group or individual video education with verbal and written material and guidebook.  Patient learns how to incorporate Pritikin recommendations into their lifestyle. Recommendations include planning and keeping personal health goals in mind as an important part of their success.  Healthy Mind-Set    Healthy Minds, Bodies, Hearts  Clinical staff conducted group or individual video education with verbal and written material and guidebook.  Patient learns how to identify when they are stressed. Video will discuss the impact of that stress, as well as the many benefits of stress management. Patient will also be introduced to stress management techniques. The way we think, act, and feel has an impact on our hearts.  How Our Thoughts Can Heal Our Hearts  Clinical staff conducted group or individual video education with verbal and written material and guidebook.  Patient learns that negative thoughts can cause depression and anxiety. This can result in negative lifestyle behavior and serious health problems. Cognitive behavioral therapy is an effective method to help control  our thoughts in order to change and improve our emotional outlook.  Additional Videos:  Exercise    Improving Performance  Clinical staff conducted group or individual video education with verbal and written material and guidebook.  Patient learns to use a non-linear approach by alternating intensity levels and lengths of time spent exercising to help burn more calories and lose more body fat. Cardiovascular exercise helps improve heart health, metabolism, hormonal balance, blood sugar control, and recovery from fatigue. Resistance training improves strength, endurance, balance, coordination, reaction time, metabolism, and muscle mass. Flexibility exercise improves circulation, posture, and balance. Seek guidance from your physician and exercise physiologist before implementing an exercise routine and learn your capabilities and proper form for all exercise.  Introduction to Yoga  Clinical staff conducted group or individual video education with  verbal and written material and guidebook.  Patient learns about yoga, a discipline of the coming together of mind, breath, and body. The benefits of yoga include improved flexibility, improved range of motion, better posture and core strength, increased lung function, weight loss, and positive self-image. Yoga's heart health benefits include lowered blood pressure, healthier heart rate, decreased cholesterol and triglyceride levels, improved immune function, and reduced stress. Seek guidance from your physician and exercise physiologist before implementing an exercise routine and learn your capabilities and proper form for all exercise.  Medical   Aging: Enhancing Your Quality of Life  Clinical staff conducted group or individual video education with verbal and written material and guidebook.  Patient learns key strategies and recommendations to stay in good physical health and enhance quality of life, such as prevention strategies, having an advocate,  securing a Health Care Proxy and Power of Attorney, and keeping a list of medications and system for tracking them. It also discusses how to avoid risk for bone loss.  Biology of Weight Control  Clinical staff conducted group or individual video education with verbal and written material and guidebook.  Patient learns that weight gain occurs because we consume more calories than we burn (eating more, moving less). Even if your body weight is normal, you may have higher ratios of fat compared to muscle mass. Too much body fat puts you at increased risk for cardiovascular disease, heart attack, stroke, type 2 diabetes, and obesity-related cancers. In addition to exercise, following the Pritikin Eating Plan can help reduce your risk.  Decoding Lab Results  Clinical staff conducted group or individual video education with verbal and written material and guidebook.  Patient learns that lab test reflects one measurement whose values change over time and are influenced by many factors, including medication, stress, sleep, exercise, food, hydration, pre-existing medical conditions, and more. It is recommended to use the knowledge from this video to become more involved with your lab results and evaluate your numbers to speak with your doctor.   Diseases of Our Time - Overview  Clinical staff conducted group or individual video education with verbal and written material and guidebook.  Patient learns that according to the CDC, 50% to 70% of chronic diseases (such as obesity, type 2 diabetes, elevated lipids, hypertension, and heart disease) are avoidable through lifestyle improvements including healthier food choices, listening to satiety cues, and increased physical activity.  Sleep Disorders Clinical staff conducted group or individual video education with verbal and written material and guidebook.  Patient learns how good quality and duration of sleep are important to overall health and well-being.  Patient also learns about sleep disorders and how they impact health along with recommendations to address them, including discussing with a physician.  Nutrition  Dining Out - Part 2 Clinical staff conducted group or individual video education with verbal and written material and guidebook.  Patient learns how to plan ahead and communicate in order to maximize their dining experience in a healthy and nutritious manner. Included are recommended food choices based on the type of restaurant the patient is visiting.   Fueling a Banker conducted group or individual video education with verbal and written material and guidebook.  There is a strong connection between our food choices and our health. Diseases like obesity and type 2 diabetes are very prevalent and are in large-part due to lifestyle choices. The Pritikin Eating Plan provides plenty of food and hunger-curbing satisfaction. It is easy to follow, affordable,  and helps reduce health risks.  Menu Workshop  Clinical staff conducted group or individual video education with verbal and written material and guidebook.  Patient learns that restaurant meals can sabotage health goals because they are often packed with calories, fat, sodium, and sugar. Recommendations include strategies to plan ahead and to communicate with the manager, chef, or server to help order a healthier meal.  Planning Your Eating Strategy  Clinical staff conducted group or individual video education with verbal and written material and guidebook.  Patient learns about the Pritikin Eating Plan and its benefit of reducing the risk of disease. The Pritikin Eating Plan does not focus on calories. Instead, it emphasizes high-quality, nutrient-rich foods. By knowing the characteristics of the foods, we choose, we can determine their calorie density and make informed decisions.  Targeting Your Nutrition Priorities  Clinical staff conducted group or individual  video education with verbal and written material and guidebook.  Patient learns that lifestyle habits have a tremendous impact on disease risk and progression. This video provides eating and physical activity recommendations based on your personal health goals, such as reducing LDL cholesterol, losing weight, preventing or controlling type 2 diabetes, and reducing high blood pressure.  Vitamins and Minerals  Clinical staff conducted group or individual video education with verbal and written material and guidebook.  Patient learns different ways to obtain key vitamins and minerals, including through a recommended healthy diet. It is important to discuss all supplements you take with your doctor.   Healthy Mind-Set    Smoking Cessation  Clinical staff conducted group or individual video education with verbal and written material and guidebook.  Patient learns that cigarette smoking and tobacco addiction pose a serious health risk which affects millions of people. Stopping smoking will significantly reduce the risk of heart disease, lung disease, and many forms of cancer. Recommended strategies for quitting are covered, including working with your doctor to develop a successful plan.  Culinary   Becoming a Set designer conducted group or individual video education with verbal and written material and guidebook.  Patient learns that cooking at home can be healthy, cost-effective, quick, and puts them in control. Keys to cooking healthy recipes will include looking at your recipe, assessing your equipment needs, planning ahead, making it simple, choosing cost-effective seasonal ingredients, and limiting the use of added fats, salts, and sugars.  Cooking - Breakfast and Snacks  Clinical staff conducted group or individual video education with verbal and written material and guidebook.  Patient learns how important breakfast is to satiety and nutrition through the entire day.  Recommendations include key foods to eat during breakfast to help stabilize blood sugar levels and to prevent overeating at meals later in the day. Planning ahead is also a key component.  Cooking - Educational psychologist conducted group or individual video education with verbal and written material and guidebook.  Patient learns eating strategies to improve overall health, including an approach to cook more at home. Recommendations include thinking of animal protein as a side on your plate rather than center stage and focusing instead on lower calorie dense options like vegetables, fruits, whole grains, and plant-based proteins, such as beans. Making sauces in large quantities to freeze for later and leaving the skin on your vegetables are also recommended to maximize your experience.  Cooking - Healthy Salads and Dressing Clinical staff conducted group or individual video education with verbal and written material and guidebook.  Patient learns that  vegetables, fruits, whole grains, and legumes are the foundations of the Pritikin Eating Plan. Recommendations include how to incorporate each of these in flavorful and healthy salads, and how to create homemade salad dressings. Proper handling of ingredients is also covered. Cooking - Soups and [employer] - Soups and Desserts Clinical staff conducted group or individual video education with verbal and written material and guidebook.  Patient learns that Pritikin soups and desserts make for easy, nutritious, and delicious snacks and meal components that are low in sodium, fat, sugar, and calorie density, while high in vitamins, minerals, and filling fiber. Recommendations include simple and healthy ideas for soups and desserts.   Overview     The Pritikin Solution Program Overview Clinical staff conducted group or individual video education with verbal and written material and guidebook.  Patient learns that the results of the Pritikin  Program have been documented in more than 100 articles published in peer-reviewed journals, and the benefits include reducing risk factors for (and, in some cases, even reversing) high cholesterol, high blood pressure, type 2 diabetes, obesity, and more! An overview of the three key pillars of the Pritikin Program will be covered: eating well, doing regular exercise, and having a healthy mind-set.  WORKSHOPS  Exercise: Exercise Basics: Building Your Action Plan Clinical staff led group instruction and group discussion with PowerPoint presentation and patient guidebook. To enhance the learning environment the use of posters, models and videos may be added. At the conclusion of this workshop, patients will comprehend the difference between physical activity and exercise, as well as the benefits of incorporating both, into their routine. Patients will understand the FITT (Frequency, Intensity, Time, and Type) principle and how to use it to build an exercise action plan. In addition, safety concerns and other considerations for exercise and cardiac rehab will be addressed by the presenter. The purpose of this lesson is to promote a comprehensive and effective weekly exercise routine in order to improve patients' overall level of fitness.   Managing Heart Disease: Your Path to a Healthier Heart Clinical staff led group instruction and group discussion with PowerPoint presentation and patient guidebook. To enhance the learning environment the use of posters, models and videos may be added.At the conclusion of this workshop, patients will understand the anatomy and physiology of the heart. Additionally, they will understand how Pritikin's three pillars impact the risk factors, the progression, and the management of heart disease.  The purpose of this lesson is to provide a high-level overview of the heart, heart disease, and how the Pritikin lifestyle positively impacts risk factors.  Exercise  Biomechanics Clinical staff led group instruction and group discussion with PowerPoint presentation and patient guidebook. To enhance the learning environment the use of posters, models and videos may be added. Patients will learn how the structural parts of their bodies function and how these functions impact their daily activities, movement, and exercise. Patients will learn how to promote a neutral spine, learn how to manage pain, and identify ways to improve their physical movement in order to promote healthy living. The purpose of this lesson is to expose patients to common physical limitations that impact physical activity. Participants will learn practical ways to adapt and manage aches and pains, and to minimize their effect on regular exercise. Patients will learn how to maintain good posture while sitting, walking, and lifting.  Balance Training and Fall Prevention  Clinical staff led group instruction and group discussion with PowerPoint presentation and patient guidebook. To enhance the  learning environment the use of posters, models and videos may be added. At the conclusion of this workshop, patients will understand the importance of their sensorimotor skills (vision, proprioception, and the vestibular system) in maintaining their ability to balance as they age. Patients will apply a variety of balancing exercises that are appropriate for their current level of function. Patients will understand the common causes for poor balance, possible solutions to these problems, and ways to modify their physical environment in order to minimize their fall risk. The purpose of this lesson is to teach patients about the importance of maintaining balance as they age and ways to minimize their risk of falling.  WORKSHOPS   Nutrition:  Fueling a Ship broker led group instruction and group discussion with PowerPoint presentation and patient guidebook. To enhance the learning  environment the use of posters, models and videos may be added. Patients will review the foundational principles of the Pritikin Eating Plan and understand what constitutes a serving size in each of the food groups. Patients will also learn Pritikin-friendly foods that are better choices when away from home and review make-ahead meal and snack options. Calorie density will be reviewed and applied to three nutrition priorities: weight maintenance, weight loss, and weight gain. The purpose of this lesson is to reinforce (in a group setting) the key concepts around what patients are recommended to eat and how to apply these guidelines when away from home by planning and selecting Pritikin-friendly options. Patients will understand how calorie density may be adjusted for different weight management goals.  Mindful Eating  Clinical staff led group instruction and group discussion with PowerPoint presentation and patient guidebook. To enhance the learning environment the use of posters, models and videos may be added. Patients will briefly review the concepts of the Pritikin Eating Plan and the importance of low-calorie dense foods. The concept of mindful eating will be introduced as well as the importance of paying attention to internal hunger signals. Triggers for non-hunger eating and techniques for dealing with triggers will be explored. The purpose of this lesson is to provide patients with the opportunity to review the basic principles of the Pritikin Eating Plan, discuss the value of eating mindfully and how to measure internal cues of hunger and fullness using the Hunger Scale. Patients will also discuss reasons for non-hunger eating and learn strategies to use for controlling emotional eating.  Targeting Your Nutrition Priorities Clinical staff led group instruction and group discussion with PowerPoint presentation and patient guidebook. To enhance the learning environment the use of posters, models and  videos may be added. Patients will learn how to determine their genetic susceptibility to disease by reviewing their family history. Patients will gain insight into the importance of diet as part of an overall healthy lifestyle in mitigating the impact of genetics and other environmental insults. The purpose of this lesson is to provide patients with the opportunity to assess their personal nutrition priorities by looking at their family history, their own health history and current risk factors. Patients will also be able to discuss ways of prioritizing and modifying the Pritikin Eating Plan for their highest risk areas  Menu  Clinical staff led group instruction and group discussion with PowerPoint presentation and patient guidebook. To enhance the learning environment the use of posters, models and videos may be added. Using menus brought in from [employer], or printed from [employer], patients will apply the Pritikin dining out guidelines that were presented in the [employer]  Out educational video. Patients will also be able to practice these guidelines in a variety of provided scenarios. The purpose of this lesson is to provide patients with the opportunity to practice hands-on learning of the Pritikin Dining Out guidelines with actual menus and practice scenarios.  Label Reading Clinical staff led group instruction and group discussion with PowerPoint presentation and patient guidebook. To enhance the learning environment the use of posters, models and videos may be added. Patients will review and discuss the Pritikin label reading guidelines presented in Pritikin's Label Reading Educational series video. Using fool labels brought in from local grocery stores and markets, patients will apply the label reading guidelines and determine if the packaged food meet the Pritikin guidelines. The purpose of this lesson is to provide patients with the opportunity to review, discuss, and practice  hands-on learning of the Pritikin Label Reading guidelines with actual packaged food labels. Cooking School  Pritikin's [employer] are designed to teach patients ways to prepare quick, simple, and affordable recipes at home. The importance of nutrition's role in chronic disease risk reduction is reflected in its emphasis in the overall Pritikin program. By learning how to prepare essential core Pritikin Eating Plan recipes, patients will increase control over what they eat; be able to customize the flavor of foods without the use of added salt, sugar, or fat; and improve the quality of the food they consume. By learning a set of core recipes which are easily assembled, quickly prepared, and affordable, patients are more likely to prepare more healthy foods at home. These workshops focus on convenient breakfasts, simple entres, side dishes, and desserts which can be prepared with minimal effort and are consistent with nutrition recommendations for cardiovascular risk reduction. Cooking [employer] are taught by a Armed forces logistics/support/administrative officer (RD) who has been trained by the [employer]. The chef or RD has a clear understanding of the importance of minimizing - if not completely eliminating - added fat, sugar, and sodium in recipes. Throughout the series of Cooking School Workshop sessions, patients will learn about healthy ingredients and efficient methods of cooking to build confidence in their capability to prepare    Cooking School weekly topics:  Adding Flavor- Sodium-Free  Fast and Healthy Breakfasts  Powerhouse Plant-Based Proteins  Satisfying Salads and Dressings  Simple Sides and Sauces  International Cuisine-Spotlight on the [employer] Zones  Delicious Desserts  Savory Soups  [employer] - Meals in a Astronomer Appetizers and Snacks  Comforting Weekend Breakfasts  One-Pot Wonders   Fast Evening Meals  Landscape architect Your Pritikin  Plate  WORKSHOPS   Healthy Mindset (Psychosocial):  Focused Goals, Sustainable Changes Clinical staff led group instruction and group discussion with PowerPoint presentation and patient guidebook. To enhance the learning environment the use of posters, models and videos may be added. Patients will be able to apply effective goal setting strategies to establish at least one personal goal, and then take consistent, meaningful action toward that goal. They will learn to identify common barriers to achieving personal goals and develop strategies to overcome them. Patients will also gain an understanding of how our mind-set can impact our ability to achieve goals and the importance of cultivating a positive and growth-oriented mind-set. The purpose of this lesson is to provide patients with a deeper understanding of how to set and achieve personal goals, as well as the tools and strategies needed to overcome common obstacles which may arise along the way.  From Head to Heart: The Power of a Healthy Outlook  Clinical staff led group instruction and group discussion with PowerPoint presentation and patient guidebook. To enhance the learning environment the use of posters, models and videos may be added. Patients will be able to recognize and describe the impact of emotions and mood on physical health. They will discover the importance of self-care and explore self-care practices which may work for them. Patients will also learn how to utilize the 4 C's to cultivate a healthier outlook and better manage stress and challenges. The purpose of this lesson is to demonstrate to patients how a healthy outlook is an essential part of maintaining good health, especially as they continue their cardiac rehab journey.  Healthy Sleep for a Healthy Heart Clinical staff led group instruction and group discussion with PowerPoint presentation and patient guidebook. To enhance the learning environment the use of posters,  models and videos may be added. At the conclusion of this workshop, patients will be able to demonstrate knowledge of the importance of sleep to overall health, well-being, and quality of life. They will understand the symptoms of, and treatments for, common sleep disorders. Patients will also be able to identify daytime and nighttime behaviors which impact sleep, and they will be able to apply these tools to help manage sleep-related challenges. The purpose of this lesson is to provide patients with a general overview of sleep and outline the importance of quality sleep. Patients will learn about a few of the most common sleep disorders. Patients will also be introduced to the concept of "sleep hygiene," and discover ways to self-manage certain sleeping problems through simple daily behavior changes. Finally, the workshop will motivate patients by clarifying the links between quality sleep and their goals of heart-healthy living.   Recognizing and Reducing Stress Clinical staff led group instruction and group discussion with PowerPoint presentation and patient guidebook. To enhance the learning environment the use of posters, models and videos may be added. At the conclusion of this workshop, patients will be able to understand the types of stress reactions, differentiate between acute and chronic stress, and recognize the impact that chronic stress has on their health. They will also be able to apply different coping mechanisms, such as reframing negative self-talk. Patients will have the opportunity to practice a variety of stress management techniques, such as deep abdominal breathing, progressive muscle relaxation, and/or guided imagery.  The purpose of this lesson is to educate patients on the role of stress in their lives and to provide healthy techniques for coping with it.  Learning Barriers/Preferences:  Learning Barriers/Preferences - 08/16/22 1001       Learning Barriers/Preferences   Learning  Barriers None    Learning Preferences Audio;Computer/Internet;Group Instruction;Individual Instruction;Pictoral;Skilled Demonstration;Verbal Instruction;Video;Written Material             Education Topics:  Knowledge Questionnaire Score:  Knowledge Questionnaire Score - 08/16/22 0919       Knowledge Questionnaire Score   Pre Score 20/24             Core Components/Risk Factors/Patient Goals at Admission:  Personal Goals and Risk Factors at Admission - 08/16/22 1003       Core Components/Risk Factors/Patient Goals on Admission    Weight Management Yes;Obesity;Weight Loss    Intervention Weight Management: Develop a combined nutrition and exercise program designed to reach desired caloric intake, while maintaining appropriate intake of nutrient and fiber, sodium and fats, and appropriate energy expenditure required for the weight goal.;Weight Management:  Provide education and appropriate resources to help participant work on and attain dietary goals.;Weight Management/Obesity: Establish reasonable short term and long term weight goals.;Obesity: Provide education and appropriate resources to help participant work on and attain dietary goals.    Admit Weight 312 lb 2.7 oz (141.6 kg)    Expected Outcomes Long Term: Adherence to nutrition and physical activity/exercise program aimed toward attainment of established weight goal;Short Term: Continue to assess and modify interventions until short term weight is achieved;Weight Loss: Understanding of general recommendations for a balanced deficit meal plan, which promotes 1-2 lb weight loss per week and includes a negative energy balance of [phone] kcal/d;Understanding of distribution of calorie intake throughout the day with the consumption of 4-5 meals/snacks;Understanding recommendations for meals to include 15-35% energy as protein, 25-35% energy from fat, 35-60% energy from carbohydrates, less than 200mg  of dietary cholesterol, 20-35 gm  of total fiber daily    Heart Failure Yes    Intervention Provide a combined exercise and nutrition program that is supplemented with education, support and counseling about heart failure. Directed toward relieving symptoms such as shortness of breath, decreased exercise tolerance, and extremity edema.    Expected Outcomes Improve functional capacity of life;Short term: Attendance in program 2-3 days a week with increased exercise capacity. Reported lower sodium intake. Reported increased fruit and vegetable intake. Reports medication compliance.;Short term: Daily weights obtained and reported for increase. Utilizing diuretic protocols set by physician.;Long term: Adoption of self-care skills and reduction of barriers for early signs and symptoms recognition and intervention leading to self-care maintenance.    Hypertension Yes    Intervention Provide education on lifestyle modifcations including regular physical activity/exercise, weight management, moderate sodium restriction and increased consumption of fresh fruit, vegetables, and low fat dairy, alcohol moderation, and smoking cessation.;Monitor prescription use compliance.    Expected Outcomes Short Term: Continued assessment and intervention until BP is < 140/43mm HG in hypertensive participants. < 130/62mm HG in hypertensive participants with diabetes, heart failure or chronic kidney disease.;Long Term: Maintenance of blood pressure at goal levels.    Lipids Yes    Intervention Provide education and support for participant on nutrition & aerobic/resistive exercise along with prescribed medications to achieve LDL 70mg , HDL >40mg .    Expected Outcomes Short Term: Participant states understanding of desired cholesterol values and is compliant with medications prescribed. Participant is following exercise prescription and nutrition guidelines.;Long Term: Cholesterol controlled with medications as prescribed, with individualized exercise RX and with  personalized nutrition plan. Value goals: LDL < 70mg , HDL > 40 mg.    Stress Yes    Intervention Offer individual and/or small group education and counseling on adjustment to heart disease, stress management and health-related lifestyle change. Teach and support self-help strategies.;Refer participants experiencing significant psychosocial distress to appropriate mental health specialists for further evaluation and treatment. When possible, include family members and significant others in education/counseling sessions.    Expected Outcomes Short Term: Participant demonstrates changes in health-related behavior, relaxation and other stress management skills, ability to obtain effective social support, and compliance with psychotropic medications if prescribed.;Long Term: Emotional wellbeing is indicated by absence of clinically significant psychosocial distress or social isolation.             Core Components/Risk Factors/Patient Goals Review:   Goals and Risk Factor Review     Row Name 08/23/22 1545 09/13/22 1800 10/11/22 1627         Core Components/Risk Factors/Patient Goals Review   Personal Goals Review Weight Management/Obesity;Hypertension;Lipids;Stress Weight Management/Obesity;Hypertension;Lipids;Stress  Weight Management/Obesity;Hypertension;Lipids;Stress     Review Khyair started intensive cardiac rehab on 08/22/22. Khael did well with exercise, vital signs were stable Kenshawn is doing  well with exercise at intensive cardiac rehab. vital signs have stable. Hickman has lost 4.2 kg since starting the program Marreo is doing  well with exercise at intensive cardiac rehab. vital signs have stable. Xayden has lost 5.6  kg since starting the program     Expected Outcomes Marquin will continue to participate in intensive cardiac rehab for exercise, nutrition and lifestyle modifications Raphel will continue to participate in intensive cardiac rehab for exercise, nutrition and lifestyle  modifications Braxon will continue to participate in intensive cardiac rehab for exercise, nutrition and lifestyle modifications              Core Components/Risk Factors/Patient Goals at Discharge (Final Review):   Goals and Risk Factor Review - 10/11/22 1627       Core Components/Risk Factors/Patient Goals Review   Personal Goals Review Weight Management/Obesity;Hypertension;Lipids;Stress    Review Verdie is doing  well with exercise at intensive cardiac rehab. vital signs have stable. Casanova has lost 5.6  kg since starting the program    Expected Outcomes Zorian will continue to participate in intensive cardiac rehab for exercise, nutrition and lifestyle modifications             ITP Comments:  ITP Comments     Row Name 08/16/22 0806 08/23/22 1505 09/13/22 1756 10/11/22 1625     ITP Comments Armanda Magic, MD: Medical Director.  Introduction to the [facility] / Intensive Cardiac Rehab. Initial orientation packet reviewed with the patient. 30 Day ITP Review. Lamarius started intensive cardiac rehab on 08/22/22 and did well with exercise. 30 Day ITP Review. Giannis has good attendance and participation in intensive cardiac rehab 30 Day ITP Review. Yamir has good attendance and participation in intensive cardiac rehab. Abie will complete intensive cardiac rehab on 10/28/22             Comments: See ITP Comments.

## 2022-10-12 ENCOUNTER — Encounter (HOSPITAL_COMMUNITY)
Admission: RE | Admit: 2022-10-12 | Discharge: 2022-10-12 | Disposition: A | Discharge status: Home / Self Care | Payer: BC Managed Care – PPO | Source: Ambulatory Visit | Attending: Internal Medicine | Admitting: Internal Medicine

## 2022-10-12 DIAGNOSIS — I5042 Chronic combined systolic (congestive) and diastolic (congestive) heart failure: Secondary | ICD-10-CM

## 2022-10-12 DIAGNOSIS — Z955 Presence of coronary angioplasty implant and graft: Secondary | ICD-10-CM | POA: Diagnosis not present

## 2022-10-12 DIAGNOSIS — I5041 Acute combined systolic (congestive) and diastolic (congestive) heart failure: Secondary | ICD-10-CM | POA: Diagnosis not present

## 2022-10-14 ENCOUNTER — Encounter (HOSPITAL_COMMUNITY)
Admission: RE | Admit: 2022-10-14 | Discharge: 2022-10-14 | Disposition: A | Discharge status: Home / Self Care | Payer: BC Managed Care – PPO | Source: Ambulatory Visit | Attending: Internal Medicine | Admitting: Internal Medicine

## 2022-10-14 DIAGNOSIS — Z955 Presence of coronary angioplasty implant and graft: Secondary | ICD-10-CM | POA: Diagnosis not present

## 2022-10-14 DIAGNOSIS — I5041 Acute combined systolic (congestive) and diastolic (congestive) heart failure: Secondary | ICD-10-CM | POA: Diagnosis not present

## 2022-10-14 DIAGNOSIS — I5042 Chronic combined systolic (congestive) and diastolic (congestive) heart failure: Secondary | ICD-10-CM | POA: Diagnosis not present

## 2022-10-17 ENCOUNTER — Encounter (HOSPITAL_COMMUNITY): Payer: BC Managed Care – PPO

## 2022-10-19 ENCOUNTER — Encounter (HOSPITAL_COMMUNITY): Payer: BC Managed Care – PPO

## 2022-10-21 ENCOUNTER — Encounter (HOSPITAL_COMMUNITY): Payer: BC Managed Care – PPO

## 2022-10-24 ENCOUNTER — Encounter (HOSPITAL_COMMUNITY): Payer: BC Managed Care – PPO

## 2022-10-26 ENCOUNTER — Encounter (HOSPITAL_COMMUNITY)
Admission: RE | Admit: 2022-10-26 | Discharge: 2022-10-26 | Disposition: A | Discharge status: Home / Self Care | Payer: BC Managed Care – PPO | Source: Ambulatory Visit | Attending: Internal Medicine | Admitting: Internal Medicine

## 2022-10-26 DIAGNOSIS — I5041 Acute combined systolic (congestive) and diastolic (congestive) heart failure: Secondary | ICD-10-CM | POA: Diagnosis not present

## 2022-10-26 DIAGNOSIS — I5042 Chronic combined systolic (congestive) and diastolic (congestive) heart failure: Secondary | ICD-10-CM

## 2022-10-26 DIAGNOSIS — Z955 Presence of coronary angioplasty implant and graft: Secondary | ICD-10-CM

## 2022-10-28 ENCOUNTER — Encounter (HOSPITAL_COMMUNITY)
Admission: RE | Admit: 2022-10-28 | Discharge: 2022-10-28 | Disposition: A | Discharge status: Home / Self Care | Payer: BC Managed Care – PPO | Source: Ambulatory Visit | Attending: Internal Medicine | Admitting: Internal Medicine

## 2022-10-28 DIAGNOSIS — I5042 Chronic combined systolic (congestive) and diastolic (congestive) heart failure: Secondary | ICD-10-CM | POA: Diagnosis not present

## 2022-10-28 DIAGNOSIS — I5041 Acute combined systolic (congestive) and diastolic (congestive) heart failure: Secondary | ICD-10-CM | POA: Diagnosis not present

## 2022-10-28 DIAGNOSIS — Z955 Presence of coronary angioplasty implant and graft: Secondary | ICD-10-CM

## 2022-10-28 NOTE — Progress Notes (Signed)
Reviewed home exercise Rx with patient today.  Encouraged warm-up, cool-down, and stretching. Reviewed THRR of 70 -139 and keeping RPE between 11-13. Encouraged to hydrate with activity.  Reviewed weather parameters for temperature and humidity for safe exercise outdoors. Reviewed S/S to terminate exercise and when to call 911 vs MD. Reviewed the use of NTG and pt was encouraged to carry at all times. Pt encouraged to always carry a cell phone for safety when exercising outdoors. Pt verbalized understanding of the home exercise Rx and was provided a co  Lorin Picket MS, ACSM-CEP, CCRP

## 2022-10-30 IMAGING — CT CT ABD-PELV W/ CM
1 of 3 series · 13 of 32 positions shown, 18 images · IV contrast (iopamidol)
Comparison: Upper GI 02/19/2020

CLINICAL DATA: Abnormal upper GI series.

EXAM:
CT ABDOMEN AND PELVIS WITH CONTRAST
TECHNIQUE: Multidetector CT imaging of the abdomen and pelvis was performed
using the standard protocol following bolus administration of
intravenous contrast.
CONTRAST:  100mL [id number] IOPAMIDOL ([id number]) INJECTION 61%

[Series 2: abd/pelvis w/cm · axial · 0.95mm/px · z∈[-481,-56]mm · 13 of 99 slices shown, 18 images]
[im 7/99  soft-tissue]
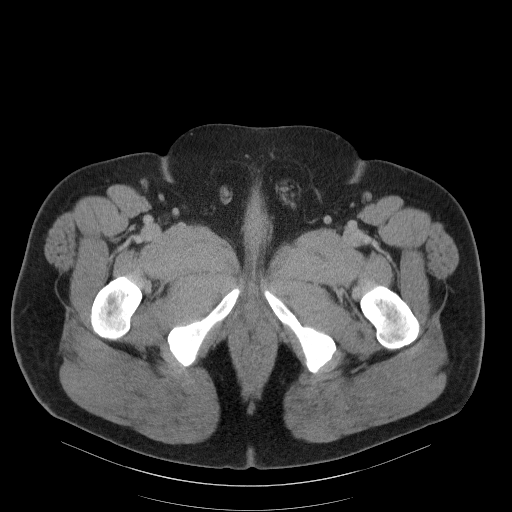
[im 7/99  bone]
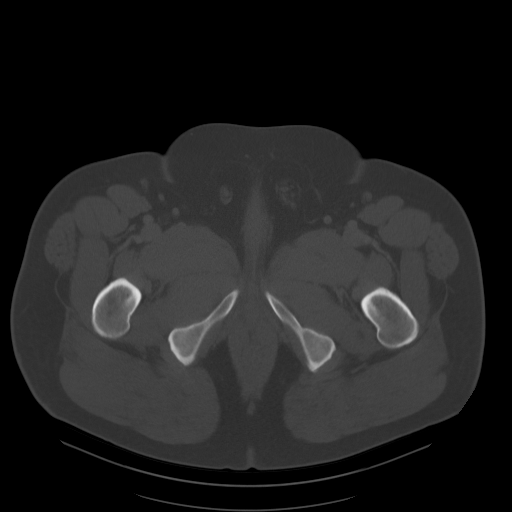
[im 14/99  soft-tissue]
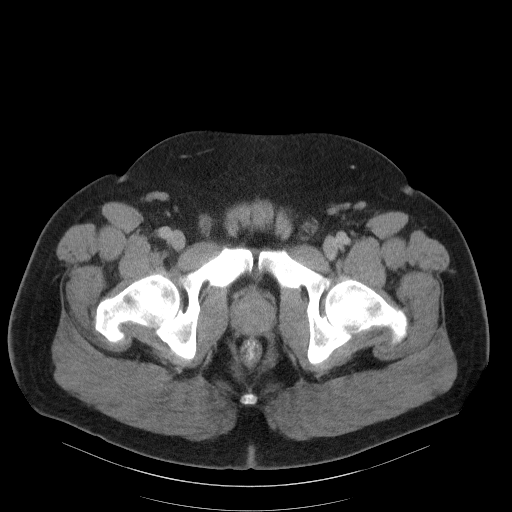
[im 20/99  soft-tissue]
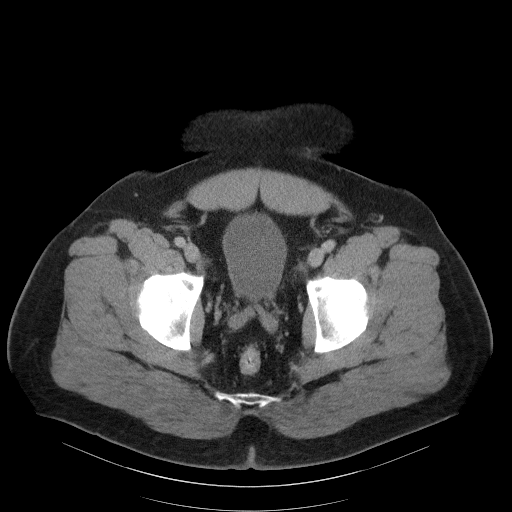
[im 33/99  soft-tissue]
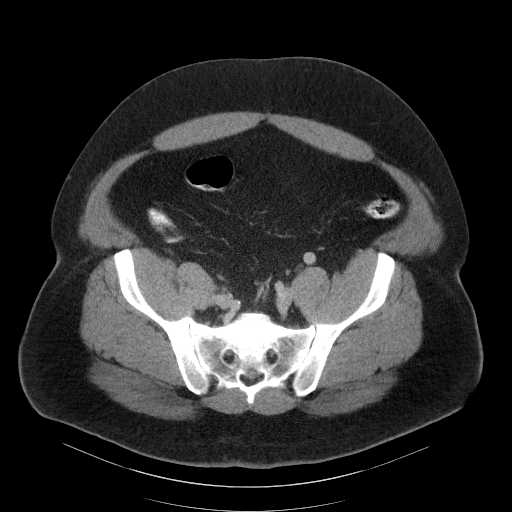
[im 40/99  soft-tissue]
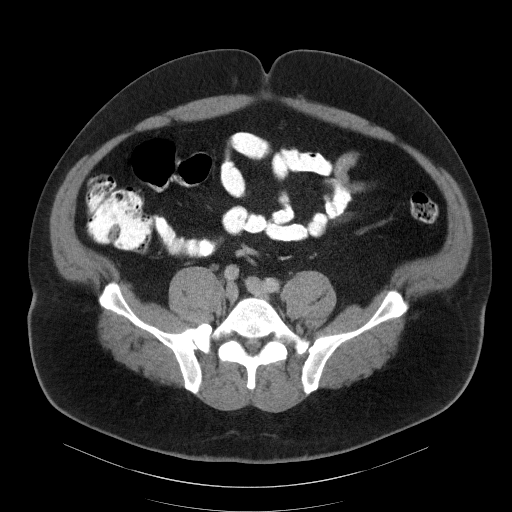
[im 46/99  soft-tissue]
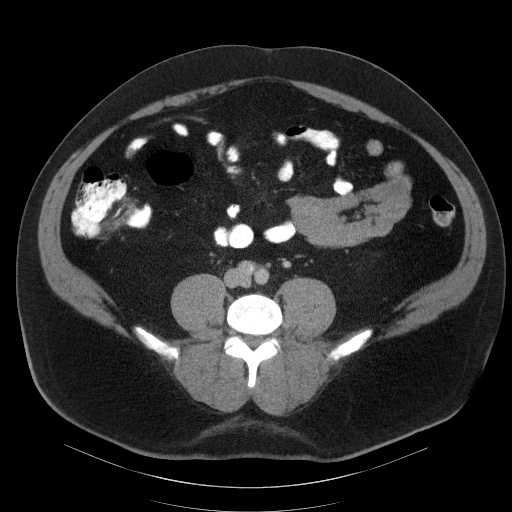
[im 53/99  soft-tissue]
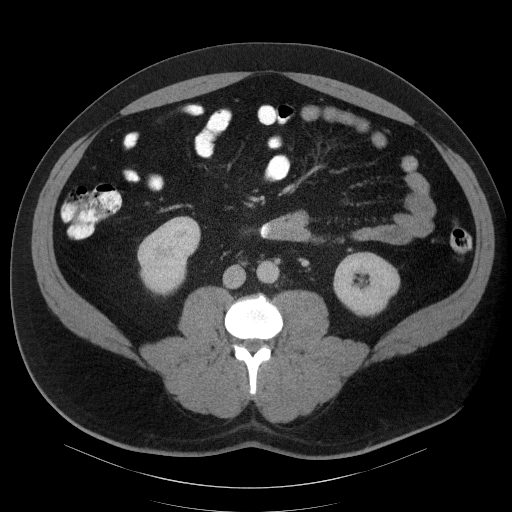
[im 59/99  soft-tissue]
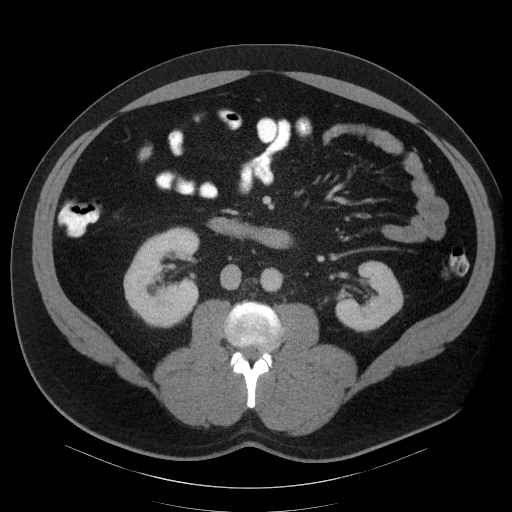
[im 66/99  soft-tissue]
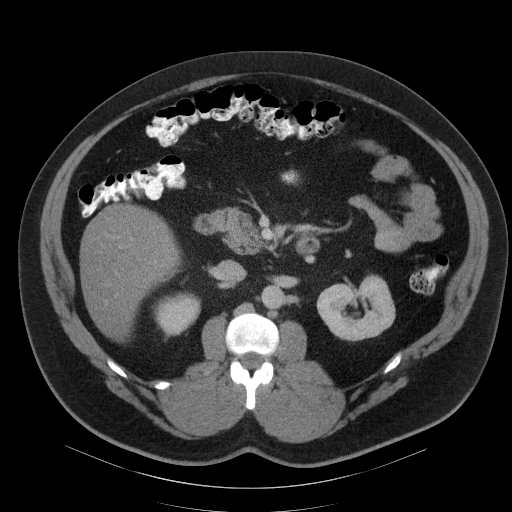
[im 66/99  bone]
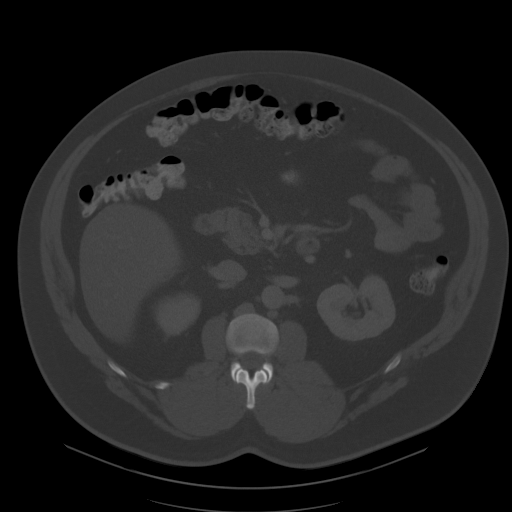
[im 72/99  lung]
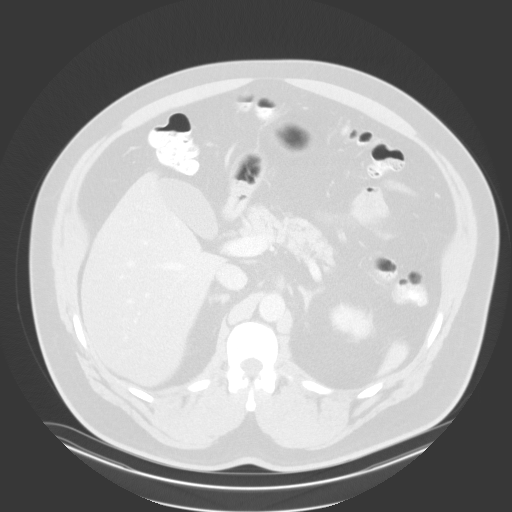
[im 79/99  soft-tissue]
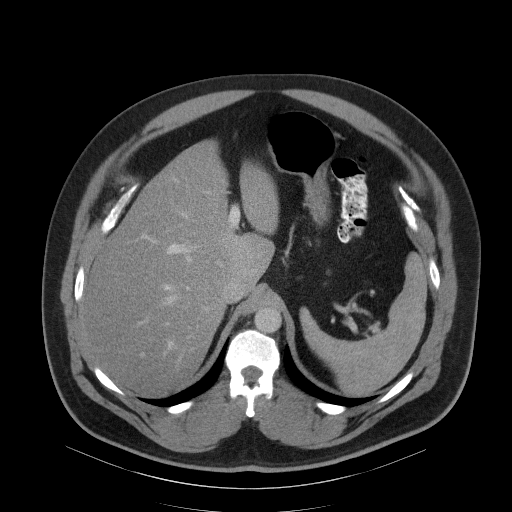
[im 79/99  lung]
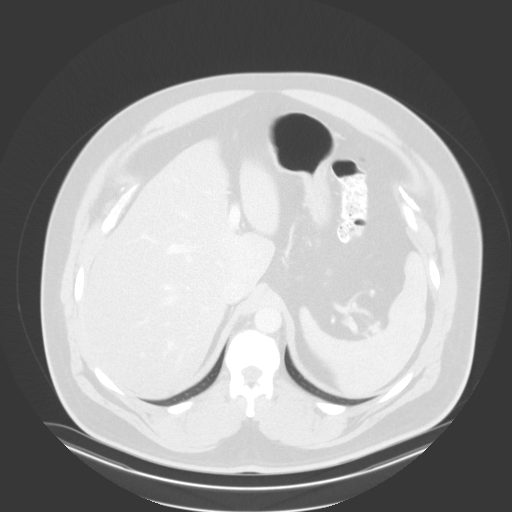
[im 85/99  soft-tissue]
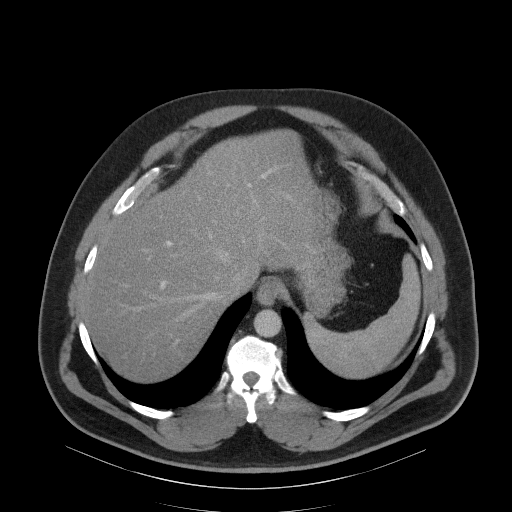
[im 85/99  lung]
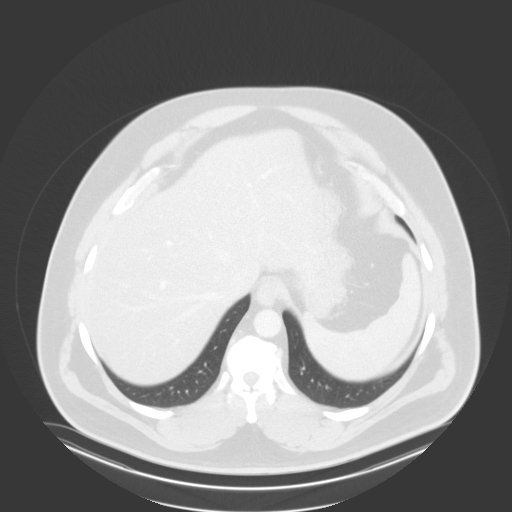
[im 92/99  soft-tissue]
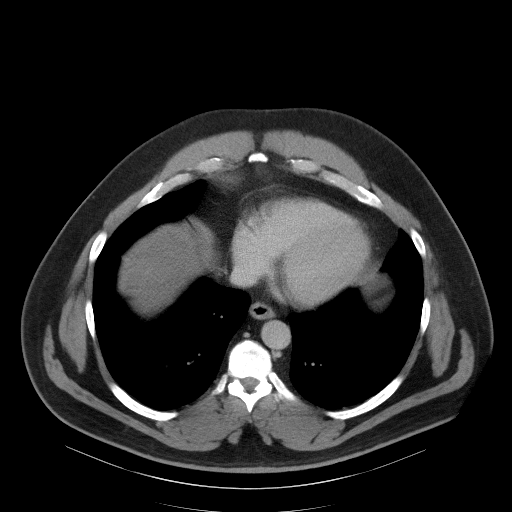
[im 92/99  lung]
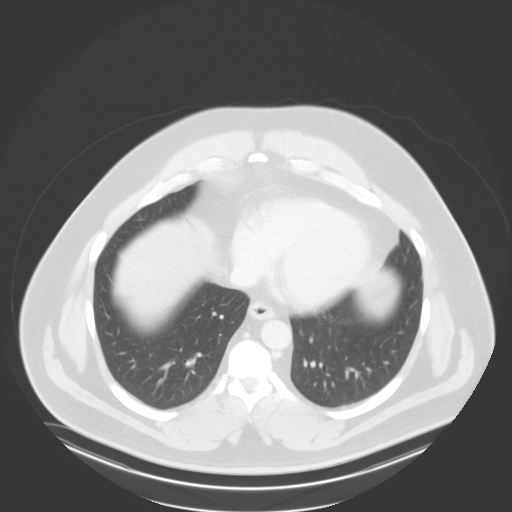

[13 of 32 positions shown; findings below may reference images not displayed]

FINDINGS: Creatinine was obtained on site at [HOSPITAL] at [HOSPITAL].

Results: Creatinine 1.1 mg/dL

-----------------------------------

Lower chest: Tiny calcified granuloma noted posterior left lower
lobe.

Hepatobiliary: The liver shows diffusely decreased attenuation
suggesting fat deposition. No suspicious focal abnormality within
the liver parenchyma. There is no evidence for gallstones,
gallbladder wall thickening, or pericholecystic fluid. No
intrahepatic or extrahepatic biliary dilation.

Pancreas: No focal mass lesion. No dilatation of the main duct. No
intraparenchymal cyst. No peripancreatic edema.

Spleen: No splenomegaly. No focal mass lesion.

Adrenals/Urinary Tract: No adrenal nodule or mass. Kidneys
unremarkable. No evidence for hydroureter. The urinary bladder
appears normal for the degree of distention.

Stomach/Bowel: Stomach is under distended but otherwise
unremarkable. No evidence for gastric mass lesion. No abnormal
perigastric abnormality. No gastric wall thickening. No evidence of
outlet obstruction. Duodenum is normally positioned as is the
ligament of [place]. No small bowel wall thickening. No small bowel
dilatation. The terminal ileum is normal. The appendix is not
visualized, but there is no edema or inflammation in the region of
the cecum. No gross colonic mass. No colonic wall thickening.

Vascular/Lymphatic: There is abdominal aortic atherosclerosis
without aneurysm. There is no gastrohepatic or hepatoduodenal
ligament lymphadenopathy. No retroperitoneal or mesenteric
lymphadenopathy. No pelvic sidewall lymphadenopathy.

Reproductive: The prostate gland and seminal vesicles are
unremarkable.

Other: No intraperitoneal free fluid.

Musculoskeletal: No worrisome lytic or sclerotic osseous
abnormality.
IMPRESSION: 1. Stomach is nondistended but unremarkable in appearance by CT.
Specifically, no evidence for focal wall thickening or gastric mass.
No findings to account for the "waist like" narrowing seen on recent
upper GI series.

## 2022-10-31 ENCOUNTER — Encounter (HOSPITAL_COMMUNITY)
Admission: RE | Admit: 2022-10-31 | Discharge: 2022-10-31 | Disposition: A | Discharge status: Home / Self Care | Payer: BC Managed Care – PPO | Source: Ambulatory Visit | Attending: Internal Medicine | Admitting: Internal Medicine

## 2022-10-31 DIAGNOSIS — I5042 Chronic combined systolic (congestive) and diastolic (congestive) heart failure: Secondary | ICD-10-CM

## 2022-10-31 DIAGNOSIS — I5041 Acute combined systolic (congestive) and diastolic (congestive) heart failure: Secondary | ICD-10-CM

## 2022-10-31 DIAGNOSIS — Z955 Presence of coronary angioplasty implant and graft: Secondary | ICD-10-CM

## 2022-11-02 ENCOUNTER — Encounter (HOSPITAL_COMMUNITY)
Admission: RE | Admit: 2022-11-02 | Discharge: 2022-11-02 | Disposition: A | Discharge status: Home / Self Care | Payer: BC Managed Care – PPO | Source: Ambulatory Visit | Attending: Internal Medicine | Admitting: Internal Medicine

## 2022-11-02 VITALS — Ht 73.5 in | Wt 295.9 lb

## 2022-11-02 DIAGNOSIS — I5041 Acute combined systolic (congestive) and diastolic (congestive) heart failure: Secondary | ICD-10-CM

## 2022-11-02 DIAGNOSIS — Z955 Presence of coronary angioplasty implant and graft: Secondary | ICD-10-CM

## 2022-11-02 DIAGNOSIS — I5042 Chronic combined systolic (congestive) and diastolic (congestive) heart failure: Secondary | ICD-10-CM | POA: Diagnosis not present

## 2022-11-02 NOTE — Progress Notes (Signed)
Discharge Progress Report  Patient Details  Name: Alexander [name] MRN: [id number] Date of Birth: May 26, 1975 Referring Provider:   Flowsheet Row INTENSIVE CARDIAC REHAB ORIENT from 08/16/2022 in [facility]  Referring Provider Arvilla Meres, MD        Number of Visits: 58  Reason for Discharge:  Patient reached a stable level of exercise. Patient independent in their exercise. Patient has met program and personal goals.  Smoking History:  Social History   Tobacco Use  Smoking Status Former   Types: Cigars   Quit date: 08/14/2020   Years since quitting: 2.2  Smokeless Tobacco Never    Diagnosis:  Heart failure, systolic and diastolic, chronic (HCC)  07/13/22 S/P DES Prox/Mid LAD  Acute combined systolic and diastolic HF (heart failure) (HCC)  ADL UCSD:   Initial Exercise Prescription:  Initial Exercise Prescription - 08/16/22 0900       Date of Initial Exercise RX and Referring Provider   Date 08/16/22    Referring Provider Arvilla Meres, MD    Expected Discharge Date 10/28/22      Recumbant Bike   Level 2.5    RPM 60    Watts 50    Minutes 15    METs 4      Recumbant Elliptical   Level 3    RPM 60    Watts 25    Minutes 15    METs 4      Prescription Details   Frequency (times per week) 3    Duration Progress to 30 minutes of continuous aerobic without signs/symptoms of physical distress      Intensity   THRR 40-80% of Max Heartrate 70 - 139    Ratings of Perceived Exertion 11-13    Perceived Dyspnea 0-4      Progression   Progression Continue progressive overload as per policy without signs/symptoms or physical distress.      Resistance Training   Training Prescription Yes    Weight 5 lbs    Reps 10-15             Discharge Exercise Prescription (Final Exercise Prescription Changes):  Exercise Prescription Changes - 11/04/22 1400       Response to Exercise   Blood  Pressure (Admit) 122/72    Blood Pressure (Exercise) 130/80    Blood Pressure (Exit) 110/72    Heart Rate (Admit) 86 bpm    Heart Rate (Exercise) 102 bpm    Heart Rate (Exit) 86 bpm    Rating of Perceived Exertion (Exercise) 12    Symptoms None    Comments Pt graduated from the CRP2 program today    Duration Continue with 30 min of aerobic exercise without signs/symptoms of physical distress.    Intensity THRR unchanged      Progression   Progression Continue to progress workloads to maintain intensity without signs/symptoms of physical distress.    Average METs 4.05      Resistance Training   Training Prescription Yes    Weight 7 lbs    Reps 10-15    Time 10 Minutes      Interval Training   Interval Training No      Recumbant Bike   Level 4    RPM 78    Watts 86    Minutes 15    METs 4.1      Recumbant Elliptical   Level 4    RPM 79  Watts 146    Minutes 15    METs 4.1      Home Exercise Plan   Plans to continue exercise at [employer] (comment)    Frequency Add 3 additional days to program exercise sessions.    Initial Home Exercises Provided 10/28/22             Functional Capacity:  6 Minute Walk     Row Name 08/16/22 0908 10/31/22 1252       6 Minute Walk   Phase Initial Discharge    Distance 1726 feet 2325 feet    Distance % Change -- 34.7 %    Distance Feet Change -- 599 ft    Walk Time 6 minutes 6 minutes    # of Rest Breaks 0 0    MPH 3.3 4.4    METS 4 5.5    RPE 9 13    Perceived Dyspnea  0 0    VO2 Peak 14 19.3    Symptoms Yes (comment) No    Comments Low back tightness, no pain --    Resting HR 64 bpm 87 bpm    Resting BP 124/86 110/70    Resting Oxygen Saturation  95 % --    Exercise Oxygen Saturation  during 6 min walk 95 % --    Max Ex. HR 87 bpm 113 bpm    Max Ex. BP 128/84 138/80    2 Minute Post BP 110/80 --             Psychological, QOL, Others - Outcomes: PHQ 2/9:    11/02/2022    1:06 PM 08/16/2022     9:17 AM 07/17/2020    8:40 AM 02/24/2020   11:16 AM  Depression screen PHQ 2/9  Decreased Interest 0 0 0 0  Down, Depressed, Hopeless 0 0 0 0  PHQ - 2 Score 0 0 0 0  Altered sleeping 0 0    Tired, decreased energy 0 0    Change in appetite 0 2    Feeling bad or failure about yourself  0 0    Trouble concentrating 0 1    Moving slowly or fidgety/restless 0 0    Suicidal thoughts 0 0    PHQ-9 Score 0 3    Difficult doing work/chores Not difficult at all Not difficult at all      Quality of Life:  Quality of Life - 11/02/22 1627       Quality of Life Scores   Health/Function Pre 23.47 %    Health/Function Post 25.6 %    Health/Function % Change 9.08 %    Socioeconomic Pre 23.86 %    Socioeconomic Post 26.14 %    Socioeconomic % Change  9.56 %    Psych/Spiritual Pre 18.86 %    Psych/Spiritual Post 24.86 %    Psych/Spiritual % Change 31.81 %    Family Pre 28.3 %    Family Post 27.8 %    Family % Change -1.77 %    GLOBAL Pre 23.31 %    GLOBAL Post 25.88 %    GLOBAL % Change 11.03 %             Personal Goals: Goals established at orientation with interventions provided to work toward goal.  Personal Goals and Risk Factors at Admission - 08/16/22 1003       Core Components/Risk Factors/Patient Goals on Admission    Weight Management Yes;Obesity;Weight Loss    Intervention Weight  Management: Develop a combined nutrition and exercise program designed to reach desired caloric intake, while maintaining appropriate intake of nutrient and fiber, sodium and fats, and appropriate energy expenditure required for the weight goal.;Weight Management: Provide education and appropriate resources to help participant work on and attain dietary goals.;Weight Management/Obesity: Establish reasonable short term and long term weight goals.;Obesity: Provide education and appropriate resources to help participant work on and attain dietary goals.    Admit Weight 312 lb 2.7 oz (141.6 kg)     Expected Outcomes Long Term: Adherence to nutrition and physical activity/exercise program aimed toward attainment of established weight goal;Short Term: Continue to assess and modify interventions until short term weight is achieved;Weight Loss: Understanding of general recommendations for a balanced deficit meal plan, which promotes 1-2 lb weight loss per week and includes a negative energy balance of [phone] kcal/d;Understanding of distribution of calorie intake throughout the day with the consumption of 4-5 meals/snacks;Understanding recommendations for meals to include 15-35% energy as protein, 25-35% energy from fat, 35-60% energy from carbohydrates, less than 200mg  of dietary cholesterol, 20-35 gm of total fiber daily    Heart Failure Yes    Intervention Provide a combined exercise and nutrition program that is supplemented with education, support and counseling about heart failure. Directed toward relieving symptoms such as shortness of breath, decreased exercise tolerance, and extremity edema.    Expected Outcomes Improve functional capacity of life;Short term: Attendance in program 2-3 days a week with increased exercise capacity. Reported lower sodium intake. Reported increased fruit and vegetable intake. Reports medication compliance.;Short term: Daily weights obtained and reported for increase. Utilizing diuretic protocols set by physician.;Long term: Adoption of self-care skills and reduction of barriers for early signs and symptoms recognition and intervention leading to self-care maintenance.    Hypertension Yes    Intervention Provide education on lifestyle modifcations including regular physical activity/exercise, weight management, moderate sodium restriction and increased consumption of fresh fruit, vegetables, and low fat dairy, alcohol moderation, and smoking cessation.;Monitor prescription use compliance.    Expected Outcomes Short Term: Continued assessment and intervention until BP is  < 140/41mm HG in hypertensive participants. < 130/26mm HG in hypertensive participants with diabetes, heart failure or chronic kidney disease.;Long Term: Maintenance of blood pressure at goal levels.    Lipids Yes    Intervention Provide education and support for participant on nutrition & aerobic/resistive exercise along with prescribed medications to achieve LDL 70mg , HDL >40mg .    Expected Outcomes Short Term: Participant states understanding of desired cholesterol values and is compliant with medications prescribed. Participant is following exercise prescription and nutrition guidelines.;Long Term: Cholesterol controlled with medications as prescribed, with individualized exercise RX and with personalized nutrition plan. Value goals: LDL < 70mg , HDL > 40 mg.    Stress Yes    Intervention Offer individual and/or small group education and counseling on adjustment to heart disease, stress management and health-related lifestyle change. Teach and support self-help strategies.;Refer participants experiencing significant psychosocial distress to appropriate mental health specialists for further evaluation and treatment. When possible, include family members and significant others in education/counseling sessions.    Expected Outcomes Short Term: Participant demonstrates changes in health-related behavior, relaxation and other stress management skills, ability to obtain effective social support, and compliance with psychotropic medications if prescribed.;Long Term: Emotional wellbeing is indicated by absence of clinically significant psychosocial distress or social isolation.              Personal Goals Discharge:  Goals and Risk Factor Review  Row Name 08/23/22 1545 09/13/22 1800 10/11/22 1627         Core Components/Risk Factors/Patient Goals Review   Personal Goals Review Weight Management/Obesity;Hypertension;Lipids;Stress Weight Management/Obesity;Hypertension;Lipids;Stress Weight  Management/Obesity;Hypertension;Lipids;Stress     Review Nicoli started intensive cardiac rehab on 08/22/22. Rodolphe did well with exercise, vital signs were stable Tymeek is doing  well with exercise at intensive cardiac rehab. vital signs have stable. Nektarios has lost 4.2 kg since starting the program Yengkong is doing  well with exercise at intensive cardiac rehab. vital signs have stable. Tyreik has lost 5.6  kg since starting the program     Expected Outcomes Xen will continue to participate in intensive cardiac rehab for exercise, nutrition and lifestyle modifications Hideo will continue to participate in intensive cardiac rehab for exercise, nutrition and lifestyle modifications Jayvonn will continue to participate in intensive cardiac rehab for exercise, nutrition and lifestyle modifications              Exercise Goals and Review:  Exercise Goals     Row Name 08/16/22 0810             Exercise Goals   Increase Physical Activity Yes       Intervention Provide advice, education, support and counseling about physical activity/exercise needs.;Develop an individualized exercise prescription for aerobic and resistive training based on initial evaluation findings, risk stratification, comorbidities and participant's personal goals.       Expected Outcomes Short Term: Attend rehab on a regular basis to increase amount of physical activity.;Long Term: Add in home exercise to make exercise part of routine and to increase amount of physical activity.;Long Term: Exercising regularly at least 3-5 days a week.       Increase Strength and Stamina Yes       Intervention Provide advice, education, support and counseling about physical activity/exercise needs.;Develop an individualized exercise prescription for aerobic and resistive training based on initial evaluation findings, risk stratification, comorbidities and participant's personal goals.       Expected Outcomes Short Term: Increase workloads  from initial exercise prescription for resistance, speed, and METs.;Short Term: Perform resistance training exercises routinely during rehab and add in resistance training at home;Long Term: Improve cardiorespiratory fitness, muscular endurance and strength as measured by increased METs and functional capacity ( )       Able to understand and use rate of perceived exertion (RPE) scale Yes       Intervention Provide education and explanation on how to use RPE scale       Expected Outcomes Short Term: Able to use RPE daily in rehab to express subjective intensity level;Long Term:  Able to use RPE to guide intensity level when exercising independently       Knowledge and understanding of Target Heart Rate Range (THRR) Yes       Intervention Provide education and explanation of THRR including how the numbers were predicted and where they are located for reference       Expected Outcomes Short Term: Able to state/look up THRR;Long Term: Able to use THRR to govern intensity when exercising independently;Short Term: Able to use daily as guideline for intensity in rehab       Understanding of Exercise Prescription Yes       Intervention Provide education, explanation, and written materials on patient's individual exercise prescription       Expected Outcomes Short Term: Able to explain program exercise prescription;Long Term: Able to explain home exercise prescription to exercise independently  Exercise Goals Re-Evaluation:  Exercise Goals Re-Evaluation     Row Name 08/22/22 1715 09/19/22 1500 10/10/22 0849 11/04/22 1436       Exercise Goal Re-Evaluation   Exercise Goals Review Increase Physical Activity;Increase Strength and Stamina;Able to understand and use rate of perceived exertion (RPE) scale;Knowledge and understanding of Target Heart Rate Range (THRR);Understanding of Exercise Prescription Increase Physical Activity;Increase Strength and Stamina;Able to understand and use  rate of perceived exertion (RPE) scale;Knowledge and understanding of Target Heart Rate Range (THRR);Understanding of Exercise Prescription Increase Physical Activity;Increase Strength and Stamina;Able to understand and use rate of perceived exertion (RPE) scale;Knowledge and understanding of Target Heart Rate Range (THRR);Understanding of Exercise Prescription Increase Physical Activity;Increase Strength and Stamina;Able to understand and use rate of perceived exertion (RPE) scale;Knowledge and understanding of Target Heart Rate Range (THRR);Understanding of Exercise Prescription    Comments Pt's first day in the CRP2 program. Pt understands the exercise Rx, RPE scale and THRR. Reviewed METs and goals. Pt is making progress with peak METs of 3.6. Pt voices he sees an increase in his strength and stamina. Pt voices that he has made improvements in his diet, which is a goal, Pt also has goal of weight loss Reviewed METs and goals. Pt is making progress with peak METs of 4.0  Pt voices he sees an increase in his strength and stamina and states he is much more active now.  Pt voices that he has made improvements in his diet, especially in regards to portion control and binge eating.  P Pt graduated from the CRP2 program today. Pt had peak METs of 4.4. Pt is exercising on his off days from the program at [employer]. Pt plans to continue to exercise 5-6 x/week fo 30-60 minutes.    Expected Outcomes Will continue to monitor patient and progress exercise workloads as tolerated. Will continue to monitor patient and progress exercise workloads as tolerated. Will continue to monitor patient and progress exercise workloads as tolerated. Pt will continue to exercise at the gym.             Nutrition & Weight - Outcomes:  Pre Biometrics - 08/16/22 0815       Pre Biometrics   Waist Circumference 54 inches    Hip Circumference 49.5 inches    Waist to Hip Ratio 1.09 %    Triceps Skinfold 15 mm    % Body Fat  37.5 %    Grip Strength 54 kg    Flexibility 0 in   Cannot reach   Single Leg Stand 22.68 seconds             Post Biometrics - 11/02/22 1345        Post  Biometrics   Height 6' 1.5" (1.867 m)    Weight 134.2 kg    Waist Circumference 52.75 inches    Hip Circumference 48.5 inches    Waist to Hip Ratio 1.09 %    BMI (Calculated) 38.5    Triceps Skinfold 12 mm    % Body Fat 35.6 %    Grip Strength 60 kg    Flexibility 11.5 in    Single Leg Stand 22 seconds             Nutrition:  Nutrition Therapy & Goals - 10/27/22 0944       Nutrition Therapy   Diet Heart healthy diet    Drug/Food Interactions Statins/Certain Fruits      Personal Nutrition Goals   Nutrition Goal Patient  to identify strategies for reducing cardiovascular risk by attending the [employer] education and nutrition series weekly.    Personal Goal #2 Patient to improve diet quality by using the plate method as a guide for meal planning to include lean protein/plant protein, fruits, vegetables, whole grains, nonfat dairy as part of a well-balanced diet.    Personal Goal #3 Patient to identify strategies for weight loss of 0.5-2.0# per week.    Comments Goals in action. Jessey continues to attend the [employer] and nutrition series regularly. Dalyn is taking Wegovy to aid with weight loss; he is down 15.2# since starting with our program. He has previously completed cardiac rehab in the past. He will benefit from participation in intensive cardiac rehab for nutrition, exercise, and lifestyle modification.      Intervention Plan   Intervention Prescribe, educate and counsel regarding individualized specific dietary modifications aiming towards targeted core components such as weight, hypertension, lipid management, diabetes, heart failure and other comorbidities.;Nutrition handout(s) given to patient.    Expected Outcomes Short Term Goal: Understand basic principles of dietary content, such as calories,  fat, sodium, cholesterol and nutrients.;Long Term Goal: Adherence to prescribed nutrition plan.             Nutrition Discharge:  Nutrition Assessments - 11/07/22 1040       Rate Your Plate Scores   Pre Score 55    Post Score 63             Education Questionnaire Score:  Knowledge Questionnaire Score - 11/02/22 1622       Knowledge Questionnaire Score   Post Score 24/24             Goals reviewed with patient; copy given to patient.Pt graduates from  Intensive/Traditional cardiac rehab program on 11/02/22  with completion of 22 exercise and  13 education sessions. Pt maintained good attendance and progressed nicely during their participation in rehab as evidenced by increased MET level. Xeng increased his distance by 599 feet  and lost 7.6 kg while in the program.  Medication list reconciled. Repeat  PHQ score-  0.  Pt has made significant lifestyle changes and should be commended for their success. Rohil  achieved their goals during cardiac rehab.   Pt plans to continue exercise at [employer] 5 days a week. We are proud of Muaad's progress! Thayer Headings RN BSN

## 2022-11-04 ENCOUNTER — Encounter (HOSPITAL_COMMUNITY)
Admission: RE | Admit: 2022-11-04 | Discharge: 2022-11-04 | Disposition: A | Discharge status: Home / Self Care | Payer: BC Managed Care – PPO | Source: Ambulatory Visit | Attending: Internal Medicine | Admitting: Internal Medicine

## 2022-11-04 DIAGNOSIS — G4733 Obstructive sleep apnea (adult) (pediatric): Secondary | ICD-10-CM | POA: Diagnosis not present

## 2022-11-04 DIAGNOSIS — I5041 Acute combined systolic (congestive) and diastolic (congestive) heart failure: Secondary | ICD-10-CM

## 2022-11-04 DIAGNOSIS — Z955 Presence of coronary angioplasty implant and graft: Secondary | ICD-10-CM | POA: Diagnosis not present

## 2022-11-04 DIAGNOSIS — I5042 Chronic combined systolic (congestive) and diastolic (congestive) heart failure: Secondary | ICD-10-CM

## 2022-11-10 ENCOUNTER — Other Ambulatory Visit (HOSPITAL_COMMUNITY): Payer: Self-pay | Admitting: Cardiology

## 2022-11-10 ENCOUNTER — Encounter (HOSPITAL_COMMUNITY): Payer: Self-pay

## 2022-11-11 NOTE — Telephone Encounter (Signed)
This is a CHF pt. Dr. Mayford Knife sees pt for sleep. Please address

## 2022-11-17 ENCOUNTER — Other Ambulatory Visit (HOSPITAL_COMMUNITY): Payer: Self-pay | Admitting: Cardiology

## 2022-12-11 ENCOUNTER — Other Ambulatory Visit (HOSPITAL_COMMUNITY): Payer: Self-pay | Admitting: Cardiology

## 2023-01-02 DIAGNOSIS — M1991 Primary osteoarthritis, unspecified site: Secondary | ICD-10-CM | POA: Diagnosis not present

## 2023-01-02 DIAGNOSIS — M1A09X0 Idiopathic chronic gout, multiple sites, without tophus (tophi): Secondary | ICD-10-CM | POA: Diagnosis not present

## 2023-01-06 ENCOUNTER — Other Ambulatory Visit: Payer: Self-pay | Admitting: Physician Assistant

## 2023-01-15 NOTE — Progress Notes (Signed)
Advanced Heart Failure Clinic Note   PCP: Halford Chessman, MD [facility]: Dr. Gala Romney  EP: Dr. Lalla Brothers  Cardiologist: Sonnie Alamo  Reason for Visit: F/u for chronic systolic heart failure, PAF and CAD s/p PCI   HPI: Mr Navas is a 47 y.o. with systolic heart failure (presumed tachy-mediated), obesity, HTN, HL, adjustment disorder, anxiety, and A fib.  Presented to [facility] on 04/24/20 with palpitations and SOB in setting of heavy ETOH use EKG showed A Fib RVR. Started on diltiazem with plans for admit but he left AMA. Discharged on diltiazem + Xarelto.    Presented to [facility] ED on 04/25/20 with HF symptoms. EKG showed A Fib RVR. Admitted and started on diltiazem. Echo showed EF 35-40%. EP consulted. Started on metoprolol and losartan with plans to reassess in 3 weeks for possible cardioversion.    Sent back to [facility] ED from EP clinic on 05/13/20 with N/V and A fib RVR. On arrival he was cool and volume overloaed.  Started on amio drip + milrinone due to cardiogenic shock. Echo EF 10-15%. After amio load had successful cardioversion.   Echo 3/22 EF 45-50% RV ok. R/LHC on 08/17/20. Found to have  pLAD lesion of 70% and 80% ostial Lcx. Dr. Clifton James preformed PCI/DESx1 to pLcx. Pressure wire analysis of the mLAD noting RFR is 0.93 suggesting LAD stenosis is not flow limiting. 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 LVEDP only 4.   Echo 10/22: EF 55-60%. Lost to follow up.   Admitted 3/24 with CP. Cath showed 80% prox to mid LAD treated with Synergy XD 3.5x28 mm DES, 45% mid RCA, 55% PRDA, patent LCx stent. Plan Xarelto, Plavix and ASA x 1 month, then drop ASA; continue Plavix x 6 months then stop. Echo EF 60-65%, RV ok. GDMT titrated and discharged home, weight 300 lbs.  Finished CR in 6/24.   Here for routine f/u. Says he has been feeling great for 5 months. Walking (1-1.5 miles at good pace) and playing golf regularly. Doing some hiking. No CP, SOB, edema, orthopnea or PND.  Compliant with meds. Was on Wegovy but stopped due to side effects (GI and dizzy).   Cardiac Studies - Echo (3/24): EF 60-65%, RV ok  - LHC (3/24): 80% mLAD, s/p DES, RCA 45%, RPDA lesion 55%  - Echo (10/22): EF 55-60%  - LHC (4/22): Prox Cx lesion is 80% stenosed. Mid LAD lesion is 70% stenosed. A drug-eluting stent was successfully placed using a STENT RESOLUTE ONYX 4.0X26. Post intervention, there is a 0% residual stenosis.   1. Severe proximal Circumflex stenosis 2. Successful PTCA/DES x 1 proximal Circumflex 3. Pressure wire analysis of the mid LAD. RFR is 0.93 suggesting the mid stenosis is not flow limiting.  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 LVEDP only 4.   - Echo (3/22): LVEF 45-50%, RV ok   - Echo (12/21): LVEF 35-40%, RV ok   ROS: All systems reviewed and negative except as per HPI.   Past Medical History:  Diagnosis Date   Arrhythmia    Atrial fibrillation (HCC)    CAD (coronary artery disease) 08/17/2020   PCI + DES to pLCx, 70% mLAD negative FFR testing (medical therapy)    CHF (congestive heart failure) (HCC)    Chronic systolic heart failure (HCC)    Gout    Hyperlipidemia    Hypertension    OSA (obstructive sleep apnea) 09/2020    Current Outpatient Medications  Medication  Sig Dispense Refill   acetaminophen (TYLENOL) 500 MG tablet Take 500 mg by mouth every 6 (six) hours as needed for moderate pain.     allopurinol (ZYLOPRIM) 100 MG tablet Take 100 mg by mouth daily.     buPROPion (WELLBUTRIN XL) 300 MG 24 hr tablet Take 300 mg by mouth daily.     carvedilol (COREG) 6.25 MG tablet TAKE 1 TABLET BY MOUTH 2 TIMES DAILY WITH A MEAL. 180 tablet 3   clonazePAM (KLONOPIN) 0.5 MG tablet Take 0.5 mg by mouth 2 (two) times daily as needed for anxiety.     clopidogrel (PLAVIX) 75 MG tablet Take 1 tablet (75 mg total) by mouth daily. 90 tablet 3   colchicine 0.6 MG tablet Take 0.6 mg by mouth 2 (two) times daily as needed (gout).     dapagliflozin  propanediol (FARXIGA) 10 MG TABS tablet Take 1 tablet (10 mg total) by mouth daily. 90 tablet 3   doxazosin (CARDURA) 2 MG tablet Take 1 tablet (2 mg total) by mouth at bedtime. 30 tablet 11   furosemide (LASIX) 20 MG tablet TAKE 1 TABLET BY MOUTH AS NEEDED FOR FLUID OR EDEMA 90 tablet 3   isosorbide mononitrate (IMDUR) 30 MG 24 hr tablet TAKE 1 TABLET BY MOUTH EVERY DAY 90 tablet 1   Multiple Vitamin (MULTIVITAMIN WITH MINERALS) TABS tablet Take 1 tablet by mouth daily.     nitroGLYCERIN (NITROSTAT) 0.4 MG SL tablet Place 1 tablet (0.4 mg total) under the tongue every 5 (five) minutes as needed for chest pain. 25 tablet 3   potassium chloride SA (KLOR-CON) 20 MEQ tablet Take 1 tablet (20 mEq total) by mouth as needed (with Lasix). 90 tablet 3   rosuvastatin (CRESTOR) 40 MG tablet TAKE 1 TABLET BY MOUTH EVERY DAY 90 tablet 1   sacubitril-valsartan (ENTRESTO) 97-103 MG TAKE 1 TABLET BY MOUTH TWICE A DAY 60 tablet 11   spironolactone (ALDACTONE) 25 MG tablet TAKE 1 TABLET (25 MG TOTAL) BY MOUTH DAILY. 90 tablet 2   traMADol (ULTRAM) 50 MG tablet Take 50 mg by mouth 3 (three) times daily as needed for moderate pain.     XARELTO 20 MG TABS tablet TAKE 1 TABLET BY MOUTH EVERY DAY 30 tablet 5   fenofibrate (TRICOR) 145 MG tablet Take 145 mg by mouth daily. (Patient not taking: Reported on 01/16/2023)     No current facility-administered medications for this encounter.    Allergies  Allergen Reactions   Albuterol Anaphylaxis   Bee Venom Anaphylaxis   Metaproterenol Anaphylaxis   Other Anaphylaxis    DEET   Amoxicillin-Pot Clavulanate Itching    OK with Amoxicillin   Cephalexin Other (See Comments)    Chest tightness   Amoxicillin Itching and Rash    Itching all over       Social History   Socioeconomic History   Marital status: Divorced    Spouse name: Not on file   Number of children: 2   Years of education: 16   Highest education level: Bachelor's degree (e.g., BA, AB, BS)   Occupational History   Not on file  Tobacco Use   Smoking status: Former    Types: Cigars    Quit date: 08/14/2020    Years since quitting: 2.4   Smokeless tobacco: Never  Substance and Sexual Activity   Alcohol use: Yes    Alcohol/week: 1.0 standard drink of alcohol    Types: 1 Cans of beer per week    Comment:  drinks beer socially 1 per week   Drug use: Not Currently   Sexual activity: Not on file  Other Topics Concern   Not on file  Social History Narrative   Not on file   Social Determinants of Health   Financial Resource Strain: Not on file  Food Insecurity: No Food Insecurity (07/12/2022)   Hunger Vital Sign    Worried About Running Out of Food in the Last Year: Never true    Ran Out of Food in the Last Year: Never true  Transportation Needs: No Transportation Needs (07/12/2022)   PRAPARE - Administrator, Civil Service (Medical): No    Lack of Transportation (Non-Medical): No  Physical Activity: Not on file  Stress: Not on file  Social Connections: Unknown (09/07/2021)   [facility] from [facility]   Social Network    Social Network: Not on file  Intimate Partner Violence: Not At Risk (07/12/2022)   Humiliation, Afraid, Rape, and Kick questionnaire    Fear of Current or Ex-Partner: No    Emotionally Abused: No    Physically Abused: No    Sexually Abused: No   Family History  Problem Relation Age of Onset   COPD Mother    BP (!) 160/94   Pulse 75   Wt (!) 138.1 kg (304 lb 6.4 oz)   SpO2 98%   BMI 39.62 kg/m   Wt Readings from Last 3 Encounters:  01/16/23 (!) 138.1 kg (304 lb 6.4 oz)  11/02/22 134.2 kg (295 lb 13.7 oz)  09/19/22 (!) 137 kg (302 lb)   PHYSICAL EXAM: General:  Well appearing. No resp difficulty HEENT: normal Neck: supple. no JVD. Carotids 2+ bilat; no bruits. No lymphadenopathy or thryomegaly appreciated. Cor: PMI nondisplaced. Regular rate & rhythm. No rubs, gallops or murmurs. Lungs: clear Abdomen: obese soft, nontender,  nondistended. No hepatosplenomegaly. No bruits or masses. Good bowel sounds. Extremities: no cyanosis, clubbing, rash, edema Neuro: alert & orientedx3, cranial nerves grossly intact. moves all 4 extremities w/o difficulty. Affect pleasant  ASSESSMENT & PLAN:  1. Chronic Systolic Heart Failure - Echo (12/21): EF 35-40%, RV mildly reduced  - Admit (1/22) for a/c CHF>>cardiogenic shock in the setting of rapid afib  - Echo (1/22): EF <20%, RV mildly reduced - Echo (3/22): EF 45-50% RV ok.  - Echo (10/22): EF 55-60% RV normal  - Suspect primarily tachy mediated  - Echo (3/24): EF 60-65%, RV ok - NYHA I volume looks good - Continue Lasix 20 mg/20 KCL PRN.  - Continue Entresto 97-103 mg bid. - Continue Farxiga 10 mg daily.  - Continue spiro 25 mg daily.  - Continue carvedilol 6.25 mg bid.  - Very stable NYHA I with complete EF recovery.   2. CAD:  - LHC 08/17/20 70% mid LAD, non flow limiting by FFR anyalsis, 80% pLCx treated w/ PCI + DES. Medical management for LAD stenosis.  - LHC (3/24) s/p DES mLAD. - Continue Imdur 45 mg daily. - Continue Plavix + Xarelto + ASA, can drop ASA x 1 month; then drop Plavix after 6 months. - Continue ? blocker + statin. - Completed CR  3. PAF - Maintaining NSR post DCCV. - Amio stopped by Dr. Heber Wanette in 4/24 - Wore Zio in 8/24 - Continue Xarelto. - Continue CPAP  4. Obesity Body mass index is 39.62 kg/m. - He has met with a nutritionist and is working on weight loss - Off GLP1RA due to side effects  5. OSA - Continue  CPAP.  6. HTN - Blood pressure well controlled. Continue current regimen. - High here but SBP in 120s at home   7. Gout - Followed by Dr. Dierdre Forth ([facility]).  Doing well. Can graduate HF program and f/u with Dr. Sonnie Alamo.    Arvilla Meres, MD 01/16/23

## 2023-01-16 ENCOUNTER — Encounter (HOSPITAL_COMMUNITY): Payer: Self-pay | Admitting: Internal Medicine

## 2023-01-16 ENCOUNTER — Ambulatory Visit (HOSPITAL_COMMUNITY)
Admission: RE | Admit: 2023-01-16 | Discharge: 2023-01-16 | Disposition: A | Discharge status: Home / Self Care | Payer: BC Managed Care – PPO | Source: Ambulatory Visit | Attending: Internal Medicine | Admitting: Internal Medicine

## 2023-01-16 DIAGNOSIS — I2510 Atherosclerotic heart disease of native coronary artery without angina pectoris: Secondary | ICD-10-CM | POA: Insufficient documentation

## 2023-01-16 DIAGNOSIS — M109 Gout, unspecified: Secondary | ICD-10-CM | POA: Insufficient documentation

## 2023-01-16 DIAGNOSIS — E669 Obesity, unspecified: Secondary | ICD-10-CM | POA: Insufficient documentation

## 2023-01-16 DIAGNOSIS — G4733 Obstructive sleep apnea (adult) (pediatric): Secondary | ICD-10-CM | POA: Insufficient documentation

## 2023-01-16 DIAGNOSIS — I5022 Chronic systolic (congestive) heart failure: Secondary | ICD-10-CM | POA: Insufficient documentation

## 2023-01-16 DIAGNOSIS — Z955 Presence of coronary angioplasty implant and graft: Secondary | ICD-10-CM | POA: Diagnosis not present

## 2023-01-16 DIAGNOSIS — I10 Essential (primary) hypertension: Secondary | ICD-10-CM | POA: Diagnosis not present

## 2023-01-16 DIAGNOSIS — Z6839 Body mass index (BMI) 39.0-39.9, adult: Secondary | ICD-10-CM | POA: Insufficient documentation

## 2023-01-16 DIAGNOSIS — I480 Paroxysmal atrial fibrillation: Secondary | ICD-10-CM | POA: Diagnosis not present

## 2023-01-16 NOTE — Addendum Note (Signed)
Encounter addended by: Noralee Space, RN on: 01/16/2023 9:40 AM  Actions taken: Specialty comments modified, Clinical Note Signed

## 2023-01-16 NOTE — Patient Instructions (Signed)
CONGRATULATIONS!!! You have graduated from the Advanced Heart Failure Clinic  Please follow-up with Dr Izora Ribas

## 2023-01-30 DIAGNOSIS — Z23 Encounter for immunization: Secondary | ICD-10-CM | POA: Diagnosis not present

## 2023-01-30 DIAGNOSIS — Z0000 Encounter for general adult medical examination without abnormal findings: Secondary | ICD-10-CM | POA: Diagnosis not present

## 2023-01-30 DIAGNOSIS — E785 Hyperlipidemia, unspecified: Secondary | ICD-10-CM | POA: Diagnosis not present

## 2023-01-30 DIAGNOSIS — I2510 Atherosclerotic heart disease of native coronary artery without angina pectoris: Secondary | ICD-10-CM | POA: Diagnosis not present

## 2023-01-30 DIAGNOSIS — Z1211 Encounter for screening for malignant neoplasm of colon: Secondary | ICD-10-CM | POA: Diagnosis not present

## 2023-02-01 ENCOUNTER — Other Ambulatory Visit (HOSPITAL_COMMUNITY): Payer: Self-pay | Admitting: Internal Medicine

## 2023-02-06 DIAGNOSIS — G4733 Obstructive sleep apnea (adult) (pediatric): Secondary | ICD-10-CM | POA: Diagnosis not present

## 2023-02-14 ENCOUNTER — Other Ambulatory Visit (HOSPITAL_COMMUNITY): Payer: Self-pay | Admitting: Internal Medicine

## 2023-03-15 ENCOUNTER — Encounter: Payer: Self-pay | Admitting: Internal Medicine

## 2023-03-15 ENCOUNTER — Telehealth: Payer: Self-pay | Admitting: Internal Medicine

## 2023-03-15 ENCOUNTER — Other Ambulatory Visit: Payer: Self-pay

## 2023-03-15 MED ORDER — POTASSIUM CHLORIDE CRYS ER 20 MEQ PO TBCR: 20.0000 meq | EXTENDED_RELEASE_TABLET | ORAL | 1 refills | Status: DC | PRN

## 2023-03-15 NOTE — Telephone Encounter (Signed)
*  STAT* If patient is at the pharmacy, call can be transferred to refill team.   1. Which medications need to be refilled? (please list name of each medication and dose if known) potassium chloride SA (KLOR-CON) 20 MEQ tablet    2. Would you like to learn more about the convenience, safety, & potential cost savings by using the [facility]? NO   3. Are you open to using the [facility] (Type [facility] NO 4. Which pharmacy/location (including street and city if local pharmacy) is medication to be sent to?  [id number] - [place],  - [address] AT [facility] [address] Phone: [phone]  Fax: [phone]       5. Do they need a 30 day or 90 day supply? 90 day

## 2023-04-05 ENCOUNTER — Other Ambulatory Visit (HOSPITAL_COMMUNITY): Payer: Self-pay | Admitting: Internal Medicine

## 2023-04-11 ENCOUNTER — Ambulatory Visit: Payer: BC Managed Care – PPO | Admitting: Dermatology

## 2023-04-11 ENCOUNTER — Encounter: Payer: Self-pay | Admitting: Dermatology

## 2023-04-11 DIAGNOSIS — D492 Neoplasm of unspecified behavior of bone, soft tissue, and skin: Secondary | ICD-10-CM | POA: Diagnosis not present

## 2023-04-11 DIAGNOSIS — L820 Inflamed seborrheic keratosis: Secondary | ICD-10-CM | POA: Diagnosis not present

## 2023-04-11 NOTE — Patient Instructions (Addendum)
Patient Handout: Wound Care for Skin Biopsy Site  Taking Care of Your Skin Biopsy Site  Proper care of the biopsy site is essential for promoting healing and minimizing scarring. This handout provides instructions on how to care for your biopsy site to ensure optimal recovery.  1. Cleaning the Wound:  Clean the biopsy site daily with gentle soap and water. Gently pat the area dry with a clean, soft towel. Avoid harsh scrubbing or rubbing the area, as this can irritate the skin and delay healing.  2. Applying Aquaphor and Bandage:  After cleaning the wound, apply a thin layer of Aquaphor ointment to the biopsy site. Cover the area with a sterile bandage to protect it from dirt, bacteria, and friction. Change the bandage daily or as needed if it becomes soiled or wet.  3. Continued Care for One Week:  Repeat the cleaning, Aquaphor application, and bandaging process daily for one week following the biopsy procedure. Keeping the wound clean and moist during this initial healing period will help prevent infection and promote optimal healing.  4. Massaging Aquaphor into the Area:  ---After one week, discontinue the use of bandages but continue to apply Aquaphor to the biopsy site. ----Gently massage the Aquaphor into the area using circular motions. ---Massaging the skin helps to promote circulation and prevent the formation of scar tissue.   Additional Tips:  Avoid exposing the biopsy site to direct sunlight during the healing process, as this can cause hyperpigmentation or worsen scarring. If you experience any signs of infection, such as increased redness, swelling, warmth, or drainage from the wound, contact your healthcare provider immediately. Follow any additional instructions provided by your healthcare provider for caring for the biopsy site and managing any discomfort. Conclusion:  Taking proper care of your skin biopsy site is crucial for ensuring optimal healing and  minimizing scarring. By following these instructions for cleaning, applying Aquaphor, and massaging the area, you can promote a smooth and successful recovery. If you have any questions or concerns about caring for your biopsy site, don't hesitate to contact your healthcare provider for guidance.   Important Information  Due to recent changes in healthcare laws, you may see results of your pathology and/or laboratory studies on MyChart before the doctors have had a chance to review them. We understand that in some cases there may be results that are confusing or concerning to you. Please understand that not all results are received at the same time and often the doctors may need to interpret multiple results in order to provide you with the best plan of care or course of treatment. Therefore, we ask that you please give [place] 2 business days to thoroughly review all your results before contacting the office for clarification. Should we see a critical lab result, you will be contacted sooner.   If You Need Anything After Your Visit  If you have any questions or concerns for your doctor, please call our main line at [phone] If no one answers, please leave a voicemail as directed and we will return your call as soon as possible. Messages left after 4 pm will be answered the following business day.   You may also send [place] a message via MyChart. We typically respond to MyChart messages within 1-2 business days.  For prescription refills, please ask your pharmacy to contact our office. Our fax number is [phone].  If you have an urgent issue when the clinic is closed that cannot wait until the next business day,  you can page your doctor at the number below.    Please note that while we do our best to be available for urgent issues outside of office hours, we are not available 24/7.   If you have an urgent issue and are unable to reach [place], you may choose to seek medical care at your doctor's office,  retail clinic, urgent care center, or emergency room.  If you have a medical emergency, please immediately call 911 or go to the emergency department. In the event of inclement weather, please call our main line at [phone] for an update on the status of any delays or closures.  Dermatology Medication Tips: Please keep the boxes that topical medications come in in order to help keep track of the instructions about where and how to use these. Pharmacies typically print the medication instructions only on the boxes and not directly on the medication tubes.   If your medication is too expensive, please contact our office at [phone] or send [place] a message through MyChart.   We are unable to tell what your co-pay for medications will be in advance as this is different depending on your insurance coverage. However, we may be able to find a substitute medication at lower cost or fill out paperwork to get insurance to cover a needed medication.   If a prior authorization is required to get your medication covered by your insurance company, please allow [place] 1-2 business days to complete this process.  Drug prices often vary depending on where the prescription is filled and some pharmacies may offer cheaper prices.  The website www.goodrx.com contains coupons for medications through different pharmacies. The prices here do not account for what the cost may be with help from insurance (it may be cheaper with your insurance), but the website can give you the price if you did not use any insurance.  - You can print the associated coupon and take it with your prescription to the pharmacy.  - You may also stop by our office during regular business hours and pick up a GoodRx coupon card.  - If you need your prescription sent electronically to a different pharmacy, notify our office through [facility] or by phone at [phone]    Skin Education :   I counseled the patient regarding the  following: Sun screen (SPF 30 or greater) should be applied during peak UV exposure (between 10am and 2pm) and reapplied after exercise or swimming.  The ABCDEs of melanoma were reviewed with the patient, and the importance of monthly self-examination of moles was emphasized. Should any moles change in shape or color, or itch, bleed or burn, pt will contact our office for evaluation sooner then their interval appointment.  Plan: Sunscreen Recommendations I recommended a broad spectrum sunscreen with a SPF of 30 or higher. I explained that SPF 30 sunscreens block approximately 97 percent of the sun's harmful rays. Sunscreens should be applied at least 15 minutes prior to expected sun exposure and then every 2 hours after that as long as sun exposure continues. If swimming or exercising sunscreen should be reapplied every 45 minutes to an hour after getting wet or sweating. One ounce, or the equivalent of a shot glass full of sunscreen, is adequate to protect the skin not covered by a bathing suit. I also recommended a lip balm with a sunscreen as well. Sun protective clothing can be used in lieu of sunscreen but must be worn the entire time you are exposed  to the sun's rays.

## 2023-04-11 NOTE — Progress Notes (Signed)
   New Patient Visit   Subjective  Alexander [name] is a 47 y.o. male who presents for the following: Spot check for a mole on his buttock.  Pt has no hx or family hx of skin cancer.  The patient has spots, moles and lesions to be evaluated, some may be new or changing.  Lesion on right buttock, present for several years, recently become itchy and more raised.   The following portions of the chart were reviewed this encounter and updated as appropriate: medications, allergies, medical history  Review of Systems:  No other skin or systemic complaints except as noted in HPI or Assessment and Plan.  Objective  Well appearing patient in no apparent distress; mood and affect are within normal limits.  A focused examination was performed of the following areas:  Buttocks  Relevant exam findings are noted in the Assessment and Plan.  Right Buttock 1.4 x 0.8 cm brown stuck on plaque on the right buttock    Assessment & Plan    Neoplasm of skin Right Buttock  Epidermal / dermal shaving  Lesion diameter (cm):  1 Informed consent: discussed and consent obtained   Timeout: patient name, date of birth, surgical site, and procedure verified   Anesthesia: the lesion was anesthetized in a standard fashion   Anesthetic:  1% lidocaine w/ epinephrine 1-100,000 buffered w/ 8.4% NaHCO3 Instrument used: DermaBlade   Hemostasis achieved with: aluminum chloride   Outcome: patient tolerated procedure well   Post-procedure details: sterile dressing applied and wound care instructions given   Dressing type: petrolatum and bandage   Additional details:  1.3 x .8 brown stuck on papules  Specimen 1 - Surgical pathology Differential Diagnosis: Isk vs other  Check Margins: No    Return for TBSE.  I, Tillie Fantasia, CMA, am acting as scribe for Gwenith Daily, MD.   Documentation: I have reviewed the above documentation for accuracy and completeness, and I agree with the above.  Gwenith Daily,  MD

## 2023-04-12 ENCOUNTER — Emergency Department (HOSPITAL_BASED_OUTPATIENT_CLINIC_OR_DEPARTMENT_OTHER)
Admission: EM | Admit: 2023-04-12 | Discharge: 2023-04-12 | Disposition: A | Discharge status: Home / Self Care | Payer: BC Managed Care – PPO | Attending: Emergency Medicine | Admitting: Emergency Medicine

## 2023-04-12 ENCOUNTER — Emergency Department (HOSPITAL_BASED_OUTPATIENT_CLINIC_OR_DEPARTMENT_OTHER): Payer: BC Managed Care – PPO

## 2023-04-12 ENCOUNTER — Encounter (HOSPITAL_BASED_OUTPATIENT_CLINIC_OR_DEPARTMENT_OTHER): Payer: Self-pay | Admitting: Emergency Medicine

## 2023-04-12 ENCOUNTER — Other Ambulatory Visit (HOSPITAL_BASED_OUTPATIENT_CLINIC_OR_DEPARTMENT_OTHER): Payer: Self-pay

## 2023-04-12 ENCOUNTER — Other Ambulatory Visit: Payer: Self-pay

## 2023-04-12 DIAGNOSIS — Z7902 Long term (current) use of antithrombotics/antiplatelets: Secondary | ICD-10-CM | POA: Diagnosis not present

## 2023-04-12 DIAGNOSIS — I110 Hypertensive heart disease with heart failure: Secondary | ICD-10-CM | POA: Diagnosis not present

## 2023-04-12 DIAGNOSIS — Z7901 Long term (current) use of anticoagulants: Secondary | ICD-10-CM | POA: Diagnosis not present

## 2023-04-12 DIAGNOSIS — R109 Unspecified abdominal pain: Secondary | ICD-10-CM | POA: Diagnosis not present

## 2023-04-12 DIAGNOSIS — Z87891 Personal history of nicotine dependence: Secondary | ICD-10-CM | POA: Insufficient documentation

## 2023-04-12 DIAGNOSIS — Z955 Presence of coronary angioplasty implant and graft: Secondary | ICD-10-CM | POA: Insufficient documentation

## 2023-04-12 DIAGNOSIS — Z20822 Contact with and (suspected) exposure to covid-19: Secondary | ICD-10-CM | POA: Diagnosis not present

## 2023-04-12 DIAGNOSIS — Z79899 Other long term (current) drug therapy: Secondary | ICD-10-CM | POA: Diagnosis not present

## 2023-04-12 DIAGNOSIS — I2510 Atherosclerotic heart disease of native coronary artery without angina pectoris: Secondary | ICD-10-CM | POA: Insufficient documentation

## 2023-04-12 DIAGNOSIS — R000 Tachycardia, unspecified: Secondary | ICD-10-CM | POA: Diagnosis not present

## 2023-04-12 DIAGNOSIS — I5022 Chronic systolic (congestive) heart failure: Secondary | ICD-10-CM | POA: Insufficient documentation

## 2023-04-12 DIAGNOSIS — R197 Diarrhea, unspecified: Secondary | ICD-10-CM | POA: Diagnosis present

## 2023-04-12 LAB — CBC WITH DIFFERENTIAL/PLATELET
Abs Immature Granulocytes: 0.04 10*3/uL (ref 0.00–0.07)
Basophils Absolute: 0 10*3/uL (ref 0.0–0.1)
Basophils Relative: 0 %
Eosinophils Absolute: 0.1 10*3/uL (ref 0.0–0.5)
Eosinophils Relative: 1 %
HCT: 49.8 % (ref 39.0–52.0)
Hemoglobin: 17 g/dL (ref 13.0–17.0)
Immature Granulocytes: 0 %
Lymphocytes Relative: 10 %
Lymphs Abs: 0.9 10*3/uL (ref 0.7–4.0)
MCH: 29.1 pg (ref 26.0–34.0)
MCHC: 34.1 g/dL (ref 30.0–36.0)
MCV: 85.3 fL (ref 80.0–100.0)
Monocytes Absolute: 0.9 10*3/uL (ref 0.1–1.0)
Monocytes Relative: 10 %
Neutro Abs: 7.6 10*3/uL (ref 1.7–7.7)
Neutrophils Relative %: 79 %
Platelets: 183 10*3/uL (ref 150–400)
RBC: 5.84 MIL/uL — ABNORMAL HIGH (ref 4.22–5.81)
RDW: 13.7 % (ref 11.5–15.5)
WBC: 9.6 10*3/uL (ref 4.0–10.5)
nRBC: 0 % (ref 0.0–0.2)

## 2023-04-12 LAB — COMPREHENSIVE METABOLIC PANEL
ALT: 18 U/L (ref 0–44)
AST: 14 U/L — ABNORMAL LOW (ref 15–41)
Albumin: 4.3 g/dL (ref 3.5–5.0)
Alkaline Phosphatase: 47 U/L (ref 38–126)
Anion gap: 11 (ref 5–15)
BUN: 13 mg/dL (ref 6–20)
CO2: 24 mmol/L (ref 22–32)
Calcium: 8.9 mg/dL (ref 8.9–10.3)
Chloride: 102 mmol/L (ref 98–111)
Creatinine, Ser: 1.03 mg/dL (ref 0.61–1.24)
GFR, Estimated: >60 mL/min (ref >60)
Glucose, Bld: 156 mg/dL — ABNORMAL HIGH (ref 70–99)
Potassium: 3.8 mmol/L (ref 3.5–5.1)
Sodium: 137 mmol/L (ref 135–145)
Total Bilirubin: 0.9 mg/dL (ref <1.2)
Total Protein: 7 g/dL (ref 6.5–8.1)

## 2023-04-12 LAB — TROPONIN I (HIGH SENSITIVITY)
Troponin I (High Sensitivity): 10 ng/L (ref <18)
Troponin I (High Sensitivity): 10 ng/L (ref <18)

## 2023-04-12 LAB — RESP PANEL BY RT-PCR (RSV, FLU A&B, COVID)  RVPGX2
Influenza A by PCR: NEGATIVE
Influenza B by PCR: NEGATIVE
Resp Syncytial Virus by PCR: NEGATIVE
SARS Coronavirus 2 by RT PCR: NEGATIVE

## 2023-04-12 LAB — SURGICAL PATHOLOGY

## 2023-04-12 MED ORDER — ACETAMINOPHEN 500 MG PO TABS
1000.0000 mg | ORAL_TABLET | Freq: Once | ORAL | Status: DC
  Filled 2023-04-12: qty 2

## 2023-04-12 MED ORDER — SODIUM CHLORIDE 0.9 % IV BOLUS
1000.0000 mL | Freq: Once | INTRAVENOUS | Status: AC
  Administered 2023-04-12: 1000 mL via INTRAVENOUS

## 2023-04-12 MED ORDER — LOPERAMIDE HCL 2 MG PO CAPS
2.0000 mg | ORAL_CAPSULE | Freq: Four times a day (QID) | ORAL | 0 refills | Status: DC | PRN
Start: 2023-04-12 — End: 2024-02-06
  Filled 2023-04-12: qty 12, 3d supply, fill #0

## 2023-04-12 MED ORDER — IOHEXOL 300 MG/ML  SOLN
100.0000 mL | Freq: Once | INTRAMUSCULAR | Status: AC | PRN
  Administered 2023-04-12: 100 mL via INTRAVENOUS

## 2023-04-12 NOTE — ED Notes (Signed)
Walked patient to the restroom and back to room. Patient is on cardiac monitor and is in bed resting at this time.

## 2023-04-12 NOTE — ED Notes (Signed)
Back from CT

## 2023-04-12 NOTE — ED Notes (Signed)
Dc instructions reviewed with patient. Patient voiced understanding. Dc with belongings.  °

## 2023-04-12 NOTE — ED Triage Notes (Signed)
Pt endorses gen. ABD pain and diarrhea x 3 days, tachycardia starting last night

## 2023-04-12 NOTE — Discharge Instructions (Addendum)
While you were in the emergency department, you had blood work done that was normal.  Your CT scan of your abdomen was normal.  Your EKG and your troponin were normal.  I sent your prescription for Imodium, this can help with diarrhea.  Make sure you are getting plenty of water to drink over the next few days.  Follow-up with your primary care doctor within 1 to 2 weeks.

## 2023-04-12 NOTE — ED Notes (Signed)
Patient transported to CT 

## 2023-04-12 NOTE — ED Provider Notes (Signed)
[place] EMERGENCY DEPARTMENT AT [facility] Provider Note  CSN: [id number] Arrival date & time: 04/12/23 4098  Chief Complaint(s) Abdominal Pain  HPI Alexander [name] is a 47 y.o. male who is here today due to diarrhea and abdominal pain of 3 days duration.  Patient says that 3 days ago, he began to have some abdominal cramping, distention.  He says that last evening he noticed that his heart rate was elevated, with the patient's history of heart failure and intermittent atrial fibrillation, he was concerned that could be going on again.  He has not felt short of breath or had chest pain.   Past Medical History Past Medical History:  Diagnosis Date   Arrhythmia    Atrial fibrillation (HCC)    CAD (coronary artery disease) 08/17/2020   PCI + DES to pLCx, 70% mLAD negative FFR testing (medical therapy)    CHF (congestive heart failure) (HCC)    Chronic systolic heart failure (HCC)    Gout    Hyperlipidemia    Hypertension    OSA (obstructive sleep apnea) 09/2020   Patient Active Problem List   Diagnosis Date Noted   Paroxysmal atrial fibrillation (HCC) 09/19/2022   Unstable angina (HCC) 07/12/2022   Morbid obesity (HCC) 06/17/2021   Chronic systolic heart failure (HCC)    Coronary artery disease involving native coronary artery of native heart with unstable angina pectoris (HCC)    Acute systolic heart failure (HCC) 05/13/2020   Acute on chronic systolic (congestive) heart failure (HCC) 05/13/2020   Atrial fibrillation with RVR (HCC) 04/26/2020   HLD (hyperlipidemia) 04/26/2020   HTN (hypertension) 04/26/2020   Depression 04/26/2020   Gout 04/26/2020   Panic attacks 04/26/2020   Low back pain 04/26/2020   Home Medication(s) Prior to Admission medications   Medication Sig Start Date End Date Taking? Authorizing Provider  loperamide (IMODIUM) 2 MG capsule Take 1 capsule (2 mg total) by mouth 4 (four) times daily as needed for diarrhea or loose stools. 04/12/23  Yes  Anders Simmonds T, DO  sulfacetamide (BLEPH-10) 10 % ophthalmic solution Place 1 drop into both eyes 3 (three) times daily. 04/04/23 04/14/23 Yes [provider]  acetaminophen (TYLENOL) 500 MG tablet Take 500 mg by mouth every 6 (six) hours as needed for moderate pain.    [provider]  allopurinol (ZYLOPRIM) 100 MG tablet Take 100 mg by mouth daily. 02/13/20   [provider]  buPROPion (WELLBUTRIN XL) 300 MG 24 hr tablet Take 300 mg by mouth daily. 02/13/20   [provider]  carvedilol (COREG) 6.25 MG tablet TAKE 1 TABLET BY MOUTH 2 TIMES DAILY WITH A MEAL. 11/20/20   Bensimhon, Bevelyn Buckles, MD  clonazePAM (KLONOPIN) 0.5 MG tablet Take 0.5 mg by mouth 2 (two) times daily as needed for anxiety. 11/27/19   [provider]  clopidogrel (PLAVIX) 75 MG tablet Take 1 tablet (75 mg total) by mouth daily. 07/14/22   Azalee Course, PA  colchicine 0.6 MG tablet Take 0.6 mg by mouth 2 (two) times daily as needed (gout). 02/28/20   [provider]  dapagliflozin propanediol (FARXIGA) 10 MG TABS tablet Take 1 tablet (10 mg total) by mouth daily. 10/06/22   Chandrasekhar, Rondel Jumbo, MD  doxazosin (CARDURA) 2 MG tablet Take 1 tablet (2 mg total) by mouth at bedtime. 09/14/22   Jacklynn Ganong, FNP  furosemide (LASIX) 20 MG tablet TAKE 1 TABLET BY MOUTH AS NEEDED FOR FLUID OR EDEMA 11/17/22   [place]ford Knife, Cornelious Bryant,  MD  isosorbide mononitrate (IMDUR) 30 MG 24 hr tablet TAKE 1 TABLET BY MOUTH EVERY DAY 01/06/23   [place], Anderson Malta, FNP  Multiple Vitamin (MULTIVITAMIN WITH MINERALS) TABS tablet Take 1 tablet by mouth daily.    [provider]  nitroGLYCERIN (NITROSTAT) 0.4 MG SL tablet Place 1 tablet (0.4 mg total) under the tongue every 5 (five) minutes as needed for chest pain. 07/14/22 07/14/23  Azalee Course, PA  potassium chloride SA (KLOR-CON M) 20 MEQ tablet Take 1 tablet (20 mEq total) by mouth as needed (with Lasix). 03/15/23   Chandrasekhar, Mahesh A, MD  rosuvastatin  (CRESTOR) 40 MG tablet TAKE 1 TABLET BY MOUTH EVERY DAY 04/05/23   Chandrasekhar, Mahesh A, MD  sacubitril-valsartan (ENTRESTO) 97-103 MG TAKE 1 TABLET BY MOUTH TWICE A DAY 11/11/22   Bensimhon, Bevelyn Buckles, MD  spironolactone (ALDACTONE) 25 MG tablet TAKE 1 TABLET (25 MG TOTAL) BY MOUTH DAILY. 12/12/22   Robbie Lis M, PA-C  traMADol (ULTRAM) 50 MG tablet Take 50 mg by mouth 3 (three) times daily as needed for moderate pain. 02/13/20   [provider]  XARELTO 20 MG TABS tablet TAKE 1 TABLET BY MOUTH EVERY DAY 02/14/23   Bensimhon, Bevelyn Buckles, MD  digoxin (LANOXIN) 0.125 MG tablet Take 1 tablet (0.125 mg total) by mouth daily. 06/15/20 08/03/20  Allayne Butcher, PA-C                                                                                                                                    Past Surgical History Past Surgical History:  Procedure Laterality Date   CARDIAC CATHETERIZATION     CARDIOVERSION N/A 05/15/2020   Procedure: CARDIOVERSION;  Surgeon: Dolores Patty, MD;  Location: [facility] ENDOSCOPY;  Service: Cardiovascular;  Laterality: N/A;   CORONARY IMAGING/OCT N/A 07/13/2022   Procedure: INTRAVASCULAR IMAGING/OCT;  Surgeon: Marykay Lex, MD;  Location: [facility] INVASIVE CV LAB;  Service: Cardiovascular;  Laterality: N/A;   CORONARY STENT INTERVENTION N/A 08/17/2020   Procedure: CORONARY STENT INTERVENTION;  Surgeon: Kathleene Hazel, MD;  Location: [facility] INVASIVE CV LAB;  Service: Cardiovascular;  Laterality: N/A;   CORONARY STENT INTERVENTION N/A 07/13/2022   Procedure: CORONARY STENT INTERVENTION;  Surgeon: Marykay Lex, MD;  Location: [facility] INVASIVE CV LAB;  Service: Cardiovascular;  Laterality: N/A;   LEFT HEART CATH AND CORONARY ANGIOGRAPHY N/A 07/13/2022   Procedure: LEFT HEART CATH AND CORONARY ANGIOGRAPHY;  Surgeon: Marykay Lex, MD;  Location: [facility] INVASIVE CV LAB;  Service: Cardiovascular;  Laterality: N/A;   RIGHT/LEFT HEART CATH AND CORONARY ANGIOGRAPHY  N/A 08/17/2020   Procedure: RIGHT/LEFT HEART CATH AND CORONARY ANGIOGRAPHY;  Surgeon: Dolores Patty, MD;  Location: [facility] INVASIVE CV LAB;  Service: Cardiovascular;  Laterality: N/A;   SEPTOPLASTY     Family History Family History  Problem Relation Age of Onset   COPD Mother     Social History Social  History   Tobacco Use   Smoking status: Former    Types: Cigars    Quit date: 08/14/2020    Years since quitting: 2.6   Smokeless tobacco: Never  Substance Use Topics   Alcohol use: Yes    Alcohol/week: 1.0 standard drink of alcohol    Types: 1 Cans of beer per week    Comment: drinks beer socially 1 per week   Drug use: Not Currently   Allergies Albuterol, Bee venom, Metaproterenol, Other, Amoxicillin-pot clavulanate, Cephalexin, and Amoxicillin  Review of Systems Review of Systems  Physical Exam Vital Signs  I have reviewed the triage vital signs BP 106/74   Pulse 91   Temp 99.4 F (37.4 C)   Resp (!) 23   Wt (!) 145.2 kg   SpO2 97%   BMI 41.65 kg/m   Physical Exam Vitals reviewed.  Constitutional:      Comments: Diaphoretic  HENT:     Head: Normocephalic.  Cardiovascular:     Rate and Rhythm: Regular rhythm. Tachycardia present.  Pulmonary:     Effort: Pulmonary effort is normal.  Abdominal:     General: Abdomen is flat.     Tenderness: There is no abdominal tenderness. There is no guarding.     Hernia: No hernia is present.  Skin:    General: Skin is warm.  Neurological:     General: No focal deficit present.     ED Results and Treatments Labs (all labs ordered are listed, but only abnormal results are displayed) Labs Reviewed  COMPREHENSIVE METABOLIC PANEL - Abnormal; Notable for the following components:      Result Value   Glucose, Bld 156 (*)    AST 14 (*)    All other components within normal limits  CBC WITH DIFFERENTIAL/PLATELET - Abnormal; Notable for the following components:   RBC 5.84 (*)    All other components within normal  limits  RESP PANEL BY RT-PCR (RSV, FLU A&B, COVID)  RVPGX2  TROPONIN I (HIGH SENSITIVITY)  TROPONIN I (HIGH SENSITIVITY)                                                                                                                          Radiology DG Chest Portable 1 View  Result Date: 04/12/2023 CLINICAL DATA:  Tachycardia EXAM: PORTABLE CHEST 1 VIEW COMPARISON:  Chest radiograph dated 09/06/2022 FINDINGS: Normal lung volumes. No focal consolidations. No pleural effusion or pneumothorax. The heart size and mediastinal contours are within normal limits. No acute osseous abnormality. IMPRESSION: No active disease. Electronically Signed   By: Agustin Cree M.D.   On: 04/12/2023 10:52   CT ABDOMEN PELVIS W CONTRAST  Result Date: 04/12/2023 CLINICAL DATA:  Abdominal pain and diarrhea for 3 days. EXAM: CT ABDOMEN AND PELVIS WITH CONTRAST TECHNIQUE: Multidetector CT imaging of the abdomen and pelvis was performed using the standard protocol following bolus administration of intravenous contrast. RADIATION DOSE REDUCTION: This exam was performed according to the  departmental dose-optimization program which includes automated exposure control, adjustment of the mA and/or kV according to patient size and/or use of iterative reconstruction technique. CONTRAST:  OMNIPAQUE IOHEXOL 300 MG/ML  SOLN COMPARISON:  03/10/2020. FINDINGS: Lower chest: Lung bases are clear. Hepatobiliary: Liver normal in size. Decreased liver attenuation suggests fatty infiltration. No liver mass. Normal gallbladder. No bile duct dilation. Pancreas: Unremarkable. No pancreatic ductal dilatation or surrounding inflammatory changes. Spleen: Normal in size without focal abnormality. Adrenals/Urinary Tract: Adrenal glands are unremarkable. Kidneys are normal, without renal calculi, focal lesion, or hydronephrosis. Bladder is unremarkable. Stomach/Bowel: Colon is mostly decompressed. No wall thickening or adjacent inflammation. Stomach and  small bowel are unremarkable. No evidence of appendicitis. Vascular/Lymphatic: Minor aortic atherosclerosis. No aneurysm. No enlarged lymph nodes. Reproductive: Unremarkable. Other: No abdominal wall hernia or abnormality. No abdominopelvic ascites. Musculoskeletal: No acute or significant osseous findings. IMPRESSION: 1. No acute findings within the abdomen or pelvis. No evidence of bowel inflammation. 2. Probable hepatic steatosis. 3. Aortic atherosclerosis. Aortic Atherosclerosis (ICD10-I70.0). Electronically Signed   By: Amie Portland M.D.   On: 04/12/2023 09:47    Pertinent labs & imaging results that were available during my care of the patient were reviewed by me and considered in my medical decision making (see MDM for details).  Medications Ordered in ED Medications  acetaminophen (TYLENOL) tablet 1,000 mg (1,000 mg Oral Not Given 04/12/23 0809)  sodium chloride 0.9 % bolus 1,000 mL (0 mLs Intravenous Stopped 04/12/23 0901)  iohexol (OMNIPAQUE) 300 MG/ML solution 100 mL (100 mLs Intravenous Contrast Given 04/12/23 0915)                                                                                                                                     Procedures Procedures  (including critical care time)  Medical Decision Making / ED Course   This patient presents to the ED for concern of abdominal pain, elevated heart rate, this involves an extensive number of treatment options, and is a complaint that carries with it a high risk of complications and morbidity.  The differential diagnosis includes viral syndrome, gastroenteritis, enteritis, dehydration, less likely sepsis, less likely ACS.  MDM: 47 year old male here today for several days of abdominal pain and diarrhea.  Is also felt mildly febrile, shaky.  Based on time of year, and his appearance I think this is likely viral syndrome.  Patient mildly tachycardic, mildly elevated temperature but not a true fever.  Viral swabs ordered.   Will provide some IV fluids.  CT imaging the patient's abdomen pelvis ordered.  Lower suspicion for acute intra-abdominal process given lack of tenderness on physical exam.  Troponin ordered out of abundance of caution due to patient's cardiac risk factor and presence of diaphoresis.  This would be very unusual presentation of ACS.  Reassessment 11 AM-normal blood work, troponin not elevated.  CT scan does not show any acute process.  Discussed with  patient, will discharge home.   Additional history obtained: -Additional history obtained from  -External records from outside source obtained and reviewed including: Chart review including previous notes, labs, imaging, consultation notes   Lab Tests: -I ordered, reviewed, and interpreted labs.   The pertinent results include:   Labs Reviewed  COMPREHENSIVE METABOLIC PANEL - Abnormal; Notable for the following components:      Result Value   Glucose, Bld 156 (*)    AST 14 (*)    All other components within normal limits  CBC WITH DIFFERENTIAL/PLATELET - Abnormal; Notable for the following components:   RBC 5.84 (*)    All other components within normal limits  RESP PANEL BY RT-PCR (RSV, FLU A&B, COVID)  RVPGX2  TROPONIN I (HIGH SENSITIVITY)  TROPONIN I (HIGH SENSITIVITY)      EKG my independent review the patient's EKG shows no ST segment depressions or elevations, no T wave versions, no evidence of acute ischemia.  EKG Interpretation Date/Time:    Ventricular Rate:    PR Interval:    QRS Duration:    QT Interval:    QTC Calculation:   R Axis:      Text Interpretation:           Imaging Studies ordered: I ordered imaging studies including chest x-ray, CT imaging the abdomen pelvis I independently visualized and interpreted imaging. I agree with the radiologist interpretation   Medicines ordered and prescription drug management: Meds ordered this encounter  Medications   sodium chloride 0.9 % bolus 1,000 mL    acetaminophen (TYLENOL) tablet 1,000 mg   iohexol (OMNIPAQUE) 300 MG/ML solution 100 mL   loperamide (IMODIUM) 2 MG capsule    Sig: Take 1 capsule (2 mg total) by mouth 4 (four) times daily as needed for diarrhea or loose stools.    Dispense:  12 capsule    Refill:  0    -I have reviewed the patients home medicines and have made adjustments as needed     Cardiac Monitoring: The patient was maintained on a cardiac monitor.  I personally viewed and interpreted the cardiac monitored which showed an underlying rhythm of: Sinus rhythm  Social Determinants of Health:  Factors impacting patients care include:    Reevaluation: After the interventions noted above, I reevaluated the patient and found that they have :improved  Co morbidities that complicate the patient evaluation  Past Medical History:  Diagnosis Date   Arrhythmia    Atrial fibrillation (HCC)    CAD (coronary artery disease) 08/17/2020   PCI + DES to pLCx, 70% mLAD negative FFR testing (medical therapy)    CHF (congestive heart failure) (HCC)    Chronic systolic heart failure (HCC)    Gout    Hyperlipidemia    Hypertension    OSA (obstructive sleep apnea) 09/2020      Dispostion: Discharge home    Final Clinical Impression(s) / ED Diagnoses Final diagnoses:  Diarrhea, unspecified type     @PCDICTATION @    Anders Simmonds T, DO 04/12/23 1110

## 2023-04-24 ENCOUNTER — Other Ambulatory Visit (INDEPENDENT_AMBULATORY_CARE_PROVIDER_SITE_OTHER): Payer: Self-pay

## 2023-04-24 ENCOUNTER — Encounter: Payer: Self-pay | Admitting: Orthopedic Surgery

## 2023-04-24 ENCOUNTER — Other Ambulatory Visit (HOSPITAL_BASED_OUTPATIENT_CLINIC_OR_DEPARTMENT_OTHER): Payer: Self-pay

## 2023-04-24 ENCOUNTER — Ambulatory Visit: Payer: BC Managed Care – PPO | Admitting: Orthopedic Surgery

## 2023-04-24 VITALS — Ht 73.0 in | Wt 310.0 lb

## 2023-04-24 DIAGNOSIS — M25521 Pain in right elbow: Secondary | ICD-10-CM

## 2023-04-24 NOTE — Progress Notes (Signed)
Office Visit Note   Patient: Alexander [name]           Date of Birth: 11-13-1975           MRN: [id number] Visit Date: 04/24/2023 Requested by: Halford Chessman, MD No address on file PCP: Halford Chessman, MD  Subjective: Chief Complaint  Patient presents with   Right Elbow - Pain    HPI: Alexander [name] is a 47 y.o. male who presents to the office reporting a 2 and half month history of right elbow pain.  Localized to the lateral epicondyle.  Denies a history of injury.  He notices it if he uses the right arm including working in the yard and using the weed trimmer and blower.  Reports some decreased strength.  He is right-hand dominant.  States that the forearm gets tight and stiff at times.  Unable to take anti-inflammatories due to heart medications..                ROS: All systems reviewed are negative as they relate to the chief complaint within the history of present illness.  Patient denies fevers or chills.  Assessment & Plan: Visit Diagnoses:  1. 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  Ultrasound imaging in both elbows does not demonstrate clear areas of detachment and/or focal degeneration.  Does not look like posterior interosseous nerve compression.  Plan at this time is counterforce bracing plus physical therapy for eccentric strengthening.  Cardio training also encouraged.  We talked about cortisone injection versus PRP injection and the advantages and disadvantages of each.  Will see how he does with these initial interventions first.  Follow-Up Instructions: No follow-ups on file.   Orders:  Orders Placed This Encounter  Procedures   XR Elbow 2 Views Right   No orders of the defined types were placed in this encounter.     Procedures: No procedures performed   Clinical Data: No additional findings.  Objective: Vital Signs: Ht 6\' 1"  (1.854 m)   Wt (!) 310 lb (140.6 kg)   BMI 40.90 kg/m    Physical Exam:  Constitutional: Patient appears well-developed HEENT:  Head: Normocephalic Eyes:EOM are normal Neck: Normal range of motion Cardiovascular: Normal rate Pulmonary/chest: Effort normal Neurologic: Patient is alert Skin: Skin is warm Psychiatric: Patient has normal mood and affect  Ortho Exam: Ortho exam demonstrates full range of motion of the right elbow.  No tenderness to palpation in the radial tunnel.  No pain with resisted supination on the right-hand side.  Does have some pain with resisted wrist extension which she localizes to the lateral epicondyle.  No subluxation of the ulnar nerve.  Specialty Comments:  No specialty comments available.  Imaging: No results found.   PMFS History: Patient Active Problem List   Diagnosis Date Noted   Paroxysmal atrial fibrillation (HCC) 09/19/2022   Unstable angina (HCC) 07/12/2022   Morbid obesity (HCC) 06/17/2021   Chronic systolic heart failure (HCC)    Coronary artery disease involving native coronary artery of native heart with unstable angina pectoris (HCC)    Acute systolic heart failure (HCC) 05/13/2020   Acute on chronic systolic (congestive) heart failure (HCC) 05/13/2020   Atrial fibrillation with RVR (HCC) 04/26/2020   HLD (hyperlipidemia) 04/26/2020   HTN (hypertension) 04/26/2020   Depression 04/26/2020   Gout 04/26/2020   Panic attacks 04/26/2020   Low back pain 04/26/2020   Past Medical History:  Diagnosis Date   Arrhythmia    Atrial fibrillation (HCC)    CAD (coronary artery disease) 08/17/2020   PCI + DES to pLCx, 70% mLAD negative FFR testing (medical therapy)    CHF (congestive heart failure) (HCC)    Chronic systolic heart failure (HCC)    Gout    Hyperlipidemia    Hypertension    OSA (obstructive sleep apnea) 09/2020    Family History  Problem Relation Age of Onset   COPD Mother     Past Surgical History:  Procedure Laterality Date   CARDIAC CATHETERIZATION     CARDIOVERSION  N/A 05/15/2020   Procedure: CARDIOVERSION;  Surgeon: Dolores Patty, MD;  Location: [facility] ENDOSCOPY;  Service: Cardiovascular;  Laterality: N/A;   CORONARY IMAGING/OCT N/A 07/13/2022   Procedure: INTRAVASCULAR IMAGING/OCT;  Surgeon: Marykay Lex, MD;  Location: [facility] INVASIVE CV LAB;  Service: Cardiovascular;  Laterality: N/A;   CORONARY STENT INTERVENTION N/A 08/17/2020   Procedure: CORONARY STENT INTERVENTION;  Surgeon: Kathleene Hazel, MD;  Location: [facility] INVASIVE CV LAB;  Service: Cardiovascular;  Laterality: N/A;   CORONARY STENT INTERVENTION N/A 07/13/2022   Procedure: CORONARY STENT INTERVENTION;  Surgeon: Marykay Lex, MD;  Location: [facility] INVASIVE CV LAB;  Service: Cardiovascular;  Laterality: N/A;   LEFT HEART CATH AND CORONARY ANGIOGRAPHY N/A 07/13/2022   Procedure: LEFT HEART CATH AND CORONARY ANGIOGRAPHY;  Surgeon: Marykay Lex, MD;  Location: [facility] INVASIVE CV LAB;  Service: Cardiovascular;  Laterality: N/A;   RIGHT/LEFT HEART CATH AND CORONARY ANGIOGRAPHY N/A 08/17/2020   Procedure: RIGHT/LEFT HEART CATH AND CORONARY ANGIOGRAPHY;  Surgeon: Dolores Patty, MD;  Location: [facility] INVASIVE CV LAB;  Service: Cardiovascular;  Laterality: N/A;   SEPTOPLASTY     Social History   Occupational History   Not on file  Tobacco Use   Smoking status: Former    Types: Cigars    Quit date: 08/14/2020    Years since quitting: 2.6   Smokeless tobacco: Never  Substance and Sexual Activity   Alcohol use: Yes    Alcohol/week: 1.0 standard drink of alcohol    Types: 1 Cans of beer per week    Comment: drinks beer socially 1 per week   Drug use: Not Currently   Sexual activity: Not on file

## 2023-05-18 ENCOUNTER — Encounter (HOSPITAL_BASED_OUTPATIENT_CLINIC_OR_DEPARTMENT_OTHER): Payer: Self-pay | Admitting: Cardiology

## 2023-05-18 ENCOUNTER — Other Ambulatory Visit: Payer: Self-pay

## 2023-05-18 DIAGNOSIS — G4733 Obstructive sleep apnea (adult) (pediatric): Secondary | ICD-10-CM

## 2023-06-06 ENCOUNTER — Other Ambulatory Visit: Payer: Self-pay | Admitting: Family Medicine

## 2023-06-07 ENCOUNTER — Encounter: Payer: Self-pay | Admitting: Internal Medicine

## 2023-06-07 ENCOUNTER — Other Ambulatory Visit: Payer: Self-pay

## 2023-06-07 MED ORDER — ISOSORBIDE MONONITRATE ER 30 MG PO TB24: 30.0000 mg | ORAL_TABLET | Freq: Every day | ORAL | 1 refills | Status: DC

## 2023-06-22 ENCOUNTER — Telehealth: Payer: Self-pay | Admitting: *Deleted

## 2023-06-22 NOTE — Telephone Encounter (Signed)
   Pre-operative Risk Assessment    Patient Name: Alexander [name]  DOB: Jul 28, 1975 MRN: [id number]   Date of last office visit: 01/16/23 DR. Gala Romney Date of next office visit: NONE   Request for Surgical Clearance    Procedure:   BARIATRIC GASTRIC SLEEVE   Date of Surgery:  Clearance TBD                                Surgeon:  DR. Feliciana Rossetti Surgeon's Group or Practice Name:  [facility] Phone number:  [phone] Fax number:  [phone] [place] HALE, BARIATRIC NAVIGATOR   Type of Clearance Requested:   - Medical  - Pharmacy:  Hold Clopidogrel (Plavix) and Rivaroxaban (Xarelto)     Type of Anesthesia:  General    Additional requests/questions:    Elpidio Anis   06/22/2023, 5:49 PM

## 2023-06-23 ENCOUNTER — Telehealth: Payer: Self-pay

## 2023-06-23 ENCOUNTER — Other Ambulatory Visit: Payer: Self-pay

## 2023-06-23 NOTE — Telephone Encounter (Signed)
Patient has been scheduled for telephone visit on 07/21/23 @ 9:20. Med rec and consent done.

## 2023-06-23 NOTE — Telephone Encounter (Signed)
Patient with diagnosis of afib on Xarelto  for anticoagulation.    Procedure: BARIATRIC GASTRIC SLEEVE  Date of procedure: TBD   CHA2DS2-VASc Score = 3   This indicates a 3.2% annual risk of stroke. The patient's score is based upon: CHF History: 1 HTN History: 1 Diabetes History: 0 Stroke History: 0 Vascular Disease History: 1 Age Score: 0 Gender Score: 0   CrCl 132 mL/min  Platelet count 183 K  Per office protocol, patient can hold Xarelto for 2 days prior to procedure.     **This guidance is not considered finalized until pre-operative APP has relayed final recommendations.**

## 2023-06-23 NOTE — Telephone Encounter (Signed)
Pharmacy please advise on holding Xarelto prior to bariatric gastric sleeve scheduled for TBD. Thank you.

## 2023-06-23 NOTE — Telephone Encounter (Signed)
Patient has been scheduled for telephone visit on 07/21/23 @ 9:20. Med rec and consent done.    Patient Consent for Virtual Visit         Alexander [name] has provided verbal consent on 06/23/2023 for a virtual visit (video or telephone).   CONSENT FOR VIRTUAL VISIT FOR:  Alexander [name]  By participating in this virtual visit I agree to the following:  I hereby voluntarily request, consent and authorize [facility] and its employed or contracted physicians, physician assistants, nurse practitioners or other licensed health care professionals (the Practitioner), to provide me with telemedicine health care services (the "Services") as deemed necessary by the treating Practitioner. I acknowledge and consent to receive the Services by the Practitioner via telemedicine. I understand that the telemedicine visit will involve communicating with the Practitioner through live audiovisual communication technology and the disclosure of certain medical information by electronic transmission. I acknowledge that I have been given the opportunity to request an in-person assessment or other available alternative prior to the telemedicine visit and am voluntarily participating in the telemedicine visit.  I understand that I have the right to withhold or withdraw my consent to the use of telemedicine in the course of my care at any time, without affecting my right to future care or treatment, and that the Practitioner or I may terminate the telemedicine visit at any time. I understand that I have the right to inspect all information obtained and/or recorded in the course of the telemedicine visit and may receive copies of available information for a reasonable fee.  I understand that some of the potential risks of receiving the Services via telemedicine include:  Delay or interruption in medical evaluation due to technological equipment failure or disruption; Information transmitted may not be sufficient (e.g.  poor resolution of images) to allow for appropriate medical decision making by the Practitioner; and/or  In rare instances, security protocols could fail, causing a breach of personal health information.  Furthermore, I acknowledge that it is my responsibility to provide information about my medical history, conditions and care that is complete and accurate to the best of my ability. I acknowledge that Practitioner's advice, recommendations, and/or decision may be based on factors not within their control, such as incomplete or inaccurate data provided by me or distortions of diagnostic images or specimens that may result from electronic transmissions. I understand that the practice of medicine is not an exact science and that Practitioner makes no warranties or guarantees regarding treatment outcomes. I acknowledge that a copy of this consent can be made available to me via my patient portal [facility] MyChart), or I can request a printed copy by calling the office of [facility].    I understand that my insurance will be billed for this visit.   I have read or had this consent read to me. I understand the contents of this consent, which adequately explains the benefits and risks of the Services being provided via telemedicine.  I have been provided ample opportunity to ask questions regarding this consent and the Services and have had my questions answered to my satisfaction. I give my informed consent for the services to be provided through the use of telemedicine in my medical care

## 2023-06-23 NOTE — Telephone Encounter (Signed)
   Name: Alexander [name]  DOB: 31-Jul-1975  MRN: [id number]  Primary Cardiologist: Arvilla Meres, MD   Preoperative team, please contact this patient and set up a phone call appointment for further preoperative risk assessment. Please obtain consent and complete medication review. Thank you for your help.  I confirm that guidance regarding antiplatelet and oral anticoagulation therapy has been completed and, if necessary, noted below.  Per office protocol, patient can hold Xarelto for 2 days prior to procedure. Per cath instructions Plavix should have been discontinued at six months post intervention.  I also confirmed the patient resides in the state of [place]. As per [facility] Medical Board telemedicine laws, the patient must reside in the state in which the provider is licensed.   Napoleon Form, Leodis Rains, NP 06/23/2023, 11:07 AM [facility]

## 2023-06-27 ENCOUNTER — Other Ambulatory Visit (HOSPITAL_COMMUNITY): Payer: Self-pay | Admitting: General Surgery

## 2023-07-04 ENCOUNTER — Other Ambulatory Visit: Payer: Self-pay

## 2023-07-04 ENCOUNTER — Ambulatory Visit (HOSPITAL_COMMUNITY)
Admission: RE | Admit: 2023-07-04 | Discharge: 2023-07-04 | Disposition: A | Discharge status: Home / Self Care | Payer: BC Managed Care – PPO | Source: Ambulatory Visit | Attending: General Surgery | Admitting: General Surgery

## 2023-07-04 ENCOUNTER — Encounter (HOSPITAL_COMMUNITY): Payer: Self-pay

## 2023-07-04 ENCOUNTER — Encounter (HOSPITAL_COMMUNITY)
Admission: RE | Admit: 2023-07-04 | Discharge: 2023-07-04 | Disposition: A | Discharge status: Home / Self Care | Payer: BC Managed Care – PPO | Source: Ambulatory Visit | Attending: General Surgery | Admitting: General Surgery

## 2023-07-04 DIAGNOSIS — I10 Essential (primary) hypertension: Secondary | ICD-10-CM

## 2023-07-05 ENCOUNTER — Ambulatory Visit (HOSPITAL_COMMUNITY): Payer: BC Managed Care – PPO | Admitting: Licensed Clinical Social Worker

## 2023-07-05 DIAGNOSIS — F4323 Adjustment disorder with mixed anxiety and depressed mood: Secondary | ICD-10-CM

## 2023-07-06 ENCOUNTER — Encounter: Payer: BC Managed Care – PPO | Attending: General Surgery | Admitting: Dietician

## 2023-07-06 ENCOUNTER — Encounter: Payer: Self-pay | Admitting: Dietician

## 2023-07-06 VITALS — Ht 73.0 in | Wt 329.2 lb

## 2023-07-06 DIAGNOSIS — E669 Obesity, unspecified: Secondary | ICD-10-CM | POA: Insufficient documentation

## 2023-07-06 NOTE — Progress Notes (Addendum)
 Nutrition Assessment for Bariatric Surgery: Pre-Surgery Behavioral and Nutrition Intervention Program   Medical Nutrition Therapy  Appt Start Time: 1116    End Time: 1229  Patient was seen on 07/06/2023 for Pre-Operative Nutrition Assessment. Purpose of todays visit  enhance perioperative outcomes along with a healthy weight maintenance   Referral stated Supervised Weight Loss (SWL) visits needed: 6 months  Planned surgery: Sleeve Gastrectomy Pt expectation of surgery: decrease medication; overall wellness  NUTRITION ASSESSMENT   Anthropometrics  Start weight at [facility]: 329.2 lbs (date: 07/06/2023)  Height: 73 in BMI: 43.43 kg/m2     Clinical   Pharmacotherapy: History of weight loss medication used: He tried [place] for weight loss, but discontinued due to severe side effects.  Just started Zepbound two weeks ago, stating yesterday was his 3rd shot.  Medical hx: hypercholesterolemia, HTN, sleep apnea, obesity Medications: rosuvastatin, isosorbide, zepbound, loperamide, potassium chloride, Xarelto, spironolactone, furosemide, Entresto, dapagliflozin propanediol, doxazosin, nitroglycerin, multivitamin, acetaminophen, clopidogrel, carvedilol, tramadol, bupropion, allopurinol, colchicine Labs: most recent labs not in EMR from yesterday (01/30/2023: A1c 5.7; triglycerides 243; HDL 34; glucose 109 Notable signs/symptoms: none noted Any previous deficiencies? No  Evaluation of Nutritional Deficiencies: Micronutrient Nutrition Focused Physical Exam: Hair: No issues observed Eyes: No issues observed Mouth: No issues observed Neck: No issues observed Nails: No issues observed Skin: No issues observed  Lifestyle & Dietary Hx  Pt states his wife is very supportive of his journey with bariatric surgery, stating his wife has gone through her own weight loss journey with healthy food choices, exercise and GLP-1 medications [facility]). Pt states it is uncomfortable to exercise. Pt states  things are stressful during tax season, stating his wife does taxes for a living. Pt states he has cut out soda, except the occasional coke zero. Pt states he drinks bourbon a couple of days a week. Pt states it is more of a social thing, stating he does not miss alcohol when he doesn't drink.  Current Physical Activity Recommendations state 150 minutes per week of moderate to vigorous movement including Cardio and 1-2 days of resistance activities as well as flexibility/balance activities:  Pts current physical activity: ADLs, exercise bike or walking 2-3 times per week, 15-30 minutes with 25% recommendation reached   Sleep Hygiene: duration and quality: uses a CPAP; good sleep: 7-8 hours per night.  Current Patient Perceived Stress Level as stated by pt on a scale of 1-10:  3-9 (9 during tax season... wife does taxes for employment)       Stress Management Techniques: golf or getting outside  According to the Dietary Guidelines for Americans Recommendation: equivalent 1.5-2 cups fruits per day, equivalent 2-3 cups vegetables per day and at least half all grains whole  Fruit servings per day (on average): 0-1, meeting 0-50% recommendation  Non-starchy vegetable servings per day (on average): 1-2, meeting 50-100% recommendation  Whole Grains per day (on average): 1-2  Number of meals missed/skipped per week out of 21: 0  24-Hr Dietary Recall First Meal: coffee, smoothie (protein smoothie his wife makes) or protein shake Snack: granola bar Second Meal: left overs of spaghetti, or chicken and rice Snack: popcorn Third Meal: hot dogs or roast pork with vegetables with potatoes or fried rice. Snack: ice cream or popcorn or chips Beverages: coffee, water, bourbon, cyrstal light, orange juice, coke zero  Alcoholic beverages per week: 3-4   Estimated Energy Needs Calories: 160  NUTRITION DIAGNOSIS  Overweight/obesity ([place]-3.3) related to past poor dietary habits and physical inactivity as  evidenced  by patient w/ planned sleeve surgery following dietary guidelines for continued weight loss.  NUTRITION INTERVENTION  Nutrition counseling (C-1) and education (E-2) to facilitate bariatric surgery goals.  Educated pt on micronutrient deficiencies post-surgery and behavioral/dietary strategies to start in order to mitigate that risk   Behavioral and Dietary Interventions Pre-Op Goals Reviewed with the Patient Nutrition: Healthy Eating Behaviors Switch to non-caloric, non-carbonated and non-caffeinated beverages such as  water, unsweetened tea, Crystal Light and zero calorie beverages (aim for 64 oz. per day) Cut out grazing between meals or at night  Find a protein shake you like Eat every 3-5 hours        Eliminate distractions while eating (TV, computer, reading, driving, texting) Take 75-64 minutes to eat a meal  Decrease high sugar foods/decrease high fat/fried foods Eliminate alcoholic beverages Increase protein intake (eggs, fish, chicken, yogurt) before surgery Eat non starchy vegetables 2 times a day 7 days a week Eat complex carbohydrates such as whole grains and fruits   Behavioral Modification: Physical Activity Increase my usual daily activity (use stairs, park farther, etc.) Engage in _______________________  activity  _______ minutes ______ times per week  Other:    _________________________________________________________________     Problem Solving I will think about my usual eating patterns and how to tweak them How can my friends and family support me Barriers to starting my changes Learn and understand appetite verses hunger   Healthy Coping Allow for ___________ activities per week to help me manage stress Reframe negative thoughts I will keep a picture of someone or something that is my inspiration & look at it daily   Monitoring  Weigh myself once a week  Measure my progress by monitoring how my clothes fit Keep a food record of what I eat and  drink for the next ________ (time period) Take pictures of what I eat and drink for the next ________ (time period) Use an app to count steps/day for the next_______ (time period) Measure my progress such as increased energy and more restful sleep Monitor your acid reflux and bowel habits, are they getting better?   *Goals that are bolded indicate the pt would like to start working towards these  Handouts Provided Include  Bariatric Surgery handouts (Nutrition Visits, Pre Surgery Behavioral Change Goals, Protein Shakes Brands to Choose From, Vitamins & Mineral Supplementation)  Learning Style & Readiness for Change Teaching method utilized: Visual, Auditory, and hands on  Demonstrated degree of understanding via: Teach Back  Readiness Level: preparation Barriers to learning/adherence to lifestyle change: nothing identified  RD's Notes for Next Visit Patient progress toward chosen goals.   MONITORING & EVALUATION Dietary intake, weekly physical activity, body weight, and preoperative behavioral change goals   Next Steps  Patient is to follow up at [facility] in two week for first SWL visit.

## 2023-07-06 NOTE — Progress Notes (Addendum)
 I connected with  Alexander [name] on 07/05/23 by a video enabled telemedicine application and verified that I am speaking with the correct person using two identifiers.  Location: patient and clinician both at remote locations in [place].    I discussed the limitations of evaluation and management by telemedicine. The patient expressed understanding and agreed to proceed.   Comprehensive Clinical Assessment (CCA) Note  07/05/2023 Alexander [name] [id number]  Chief Complaint:  Chief Complaint  Patient presents with   BARIATRIC SCREENING   Visit Diagnosis:  Encounter Diagnosis  Name Primary?   Adjustment disorder with mixed anxiety and depressed mood Yes   Disposition:  Clinician sees no significant psychological factors that would hinder the success of bariatric surgery at time of assessment. Clinician supports patient candidacy for Bariatric Surgery.   Patient reports realistic expectations post surgery, is aware of the pre and post surgical process, client reports that behavioral health diagnosis(es) are stable at time of assessment, client reports positive pre and post surgical support system, and client report motivation to make positive change.     CCA Biopsychosocial Intake/Chief Complaint:  Obesity/Weight Loss/Eating  Current Symptoms/Problems: Alexander [name] is a 48 yo male reporting to [facility] for screening prior to weight loss surgery. Alexander [name] reports that he went through the process to get weight loss surgery 3 years ago and was diagnosed with A Fib and had to put the surgery on hold until his symptoms stabilized. Alexander [name] reports that he feels his weight has reached a plateau and is not responding to interventions that he has tried in the past. [name] reports that he has had the diagnoses of anxiety and depression, currently treated with bupropion.  Patient reports that his primary care physician manages his medications.  Patient denies any current suicidal ideation,  homicidal ideation, or perceptual disturbances.   Patient Reported Schizophrenia/Schizoaffective Diagnosis in Past: No   Strengths: Patient feels that he has a lot of knowledge that could be beneficial in supporting him through the weight loss journey.  Preferences: Patient reports that he enjoys spending time with family, playing golf, and riding stationary bike.  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  Patient reports that he has been taking the bupropion for 15 years.   Mental Health Symptoms Depression:  Difficulty Concentrating; Fatigue; Tearfulness; Irritability (Patient reports tearfulness at times triggered situationally, and patient reports fatigue due to current weight)   Duration of Depressive symptoms: Greater than two weeks   Mania:  Euphoria; Irritability   Anxiety:   Fatigue; Irritability; Difficulty concentrating; Restlessness; Sleep; Worrying (Patient reports a history of panic attacks, but nothing recently)   Psychosis:  None   Duration of Psychotic symptoms: No data recorded  Trauma:  None   Obsessions:  None   Compulsions:  None   Inattention:  None   Hyperactivity/Impulsivity:  None   Oppositional/Defiant Behaviors:  None   Emotional Irregularity:  None   Other Mood/Personality Symptoms:  Patient denies any additional mood related concerns    Mental Status Exam Appearance and self-care  Stature:  Average   Weight:  Obese   Clothing:  Age-appropriate   Grooming:  Normal   Cosmetic use:  None   Posture/gait:  Normal   Motor activity:   Not Remarkable   Sensorium  Attention:  Normal   Concentration:  Normal   Orientation:  X5   Recall/memory:  Normal   Affect and Mood  Affect:  Appropriate   Mood:  Other (Comment) (Mood was within normal limits)   Relating  Eye contact:  Normal   Facial expression:  No data recorded  Attitude toward examiner:  Cooperative   Thought and Language  Speech flow: Clear and Coherent   Thought content:  Appropriate to Mood and Circumstances   Preoccupation:  None   Hallucinations:  None   Organization:  No data recorded  [employer] of Knowledge:  Good   Intelligence:  Above Average   Abstraction:  Normal   Judgement:  Good   Reality Testing:  Realistic   Insight:  Good   Decision Making:  Normal   Social Functioning  Social Maturity:  Responsible   Social Judgement:  Normal   Stress  Stressors:  Grief/losses; Illness   Coping Ability:  Normal   Skill Deficits:  None   Supports:  Family; Friends/Service system     Religion: Religion/Spirituality Are You A Religious Person?: No How Might This Affect Treatment?: No religious barriers to treatment  Leisure/Recreation: Leisure / Recreation Do You Have Hobbies?: Yes Leisure and Hobbies: Patient reports that he enjoys playing golf and sports  Exercise/Diet: Exercise/Diet Do You Exercise?: Yes What Type of Exercise Do You Do?: Bike, Run/Walk How Many Times a Week Do You Exercise?: 4-5 times a week Have You Gained or Lost A Significant Amount of Weight in the Past Six Months?: No Do You Follow a Special Diet?: No Do You Have Any Trouble Sleeping?: No   CCA Employment/Education Employment/Work Situation: Employment / Work Situation Employment Situation: Employed Where is Patient Currently Employed?: Patient reports that he currently manages a Product manager company.  Patient reports that he works remotely at home How Long has Patient Been Employed?: Patient reports he has  been employed with this company for 22 years Are You Satisfied With Your Job?: Yes Do You Work More Than One Job?: No Work Stressors: Patient denies any current work stressors, and reports that he truly enjoys every aspect of his work Patient's Job has Been Impacted by Current Illness: No What is the Longest Time Patient has Held a Job?: 22 years Where was the Patient Employed at that Time?: Current job Has Patient ever Been in Equities trader?: No  Education: Education Is Patient Currently Attending School?: No Last Grade Completed: 12 Name of [employer] School: Patient reports he attended a college prep school in [place] Did Garment/textile technologist From [employer]?: Yes Did Theme park manager?: Yes What Type of College Degree Do you Have?: Bachelors degree from [employer] Did [place] Attend Graduate School?: No What Was Your Major?: Physics Did You Have Any Scientist, research (life sciences) In School?: None Did You Have An Individualized Education Program (IIEP): No Did You Have Any Difficulty At School?: No Patient's Education Has Been Impacted by Current Illness: No   CCA Family/Childhood History Family and Relationship History: Family history Marital status: Married Number of Years Married: 1 What types of issues is patient dealing with in the relationship?: Patient reports that he is not having any marital stresses at this time Additional relationship information: Patient reports that his wife is a very good support system for  him Are you sexually active?: Yes What is your sexual orientation?: Heterosexual Has your sexual activity been affected by drugs, alcohol, medication, or emotional stress?: Patient denies Does patient have children?: Yes How many children?: 2 How is patient's relationship with their children?: Patient reports that he has 2 biological children and 2 stepchildren--patient reports that he has a good relationship with all of them  Childhood History:  Childhood History By  whom was/is the patient raised?: Mother/father and step-parent Additional childhood history information: Patient reports that he lived with his mother, and stepfather in [place].  Patient reports that he considered his stepfather as his father.  Patient reports that he had a younger brother that he is still in contact with.  Patient reports very stable childhood Patient's description of current relationship with people who raised him/her: Patient reports positive relationships with family members How were you disciplined when you got in trouble as a child/adolescent?: Patient reports that he was disciplined fairly as a child with no physical abuse Does patient have siblings?: Yes Number of Siblings: 1 Description of patient's current relationship with siblings: Patient reports positive current relationship with brother Did patient suffer any verbal/emotional/physical/sexual abuse as a child?: No Did patient suffer from severe childhood neglect?: No Has patient ever been sexually abused/assaulted/raped as an adolescent or adult?: No Was the patient ever a victim of a crime or a disaster?: No Witnessed domestic violence?: No Has patient been affected by domestic violence as an adult?: No  Child/Adolescent Assessment:   CCA Substance Use Alcohol/Drug Use: Alcohol / Drug Use Pain Medications: SEE MAR Prescriptions: SEE MAR Over the Counter: SEE MAR History of alcohol / drug use?: No history of alcohol / drug abuse Longest period of sobriety (when/how long): Off and on/patient reports he does not drink much Negative Consequences of Use:  (No negative consequences of use reported) Withdrawal Symptoms: None      ASAM's:  Six Dimensions of Multidimensional Assessment  Dimension 1:  Acute Intoxication and/or Withdrawal Potential:   Dimension 1:  Description of individual's past and current experiences of substance use and withdrawal: Patient reports that he socially drinks  Dimension 2:   Biomedical Conditions and Complications:   Dimension 2:  Description of patient's biomedical conditions and  complications: Patient reports a history of anxiety, arthritis, depression, hypertension, high cholesterol, allergies, atrial fibrillation  Dimension 3:  Emotional, Behavioral, or Cognitive Conditions and Complications:  Dimension 3:  Description of emotional, behavioral, or cognitive conditions and complications: Patient reports a stabilized history of anxiety and depression treatment  Dimension 4:  Readiness to Change:  Dimension 4:  Description of Readiness to Change criteria: Patient reports that he is ready to make positive changes to improve overall wellness  Dimension 5:  Relapse, Continued use, or Continued Problem Potential:  Dimension 5:  Relapse, continued use, or continued problem potential critiera description: Patient reports good support system  Dimension 6:  Recovery/Living Environment:  Dimension 6:  Recovery/Iiving environment criteria description: Patient reports good support system  ASAM Severity Score: ASAM's Severity Rating Score: 0  ASAM Recommended Level of Treatment: ASAM Recommended Level of Treatment: Level I Outpatient Treatment   Substance use Disorder (SUD) Substance Use Disorder (SUD)  Checklist Symptoms of Substance Use:  (Patient does not have any symptoms)  Recommendations for Services/Supports/Treatments: Recommendations for Services/Supports/Treatments Recommendations For Services/Supports/Treatments: Individual Therapy  DSM5 Diagnoses: Patient Active Problem List   Diagnosis Date Noted   Paroxysmal atrial fibrillation (HCC) 09/19/2022   Unstable angina (HCC) 07/12/2022  Morbid obesity (HCC) 06/17/2021   Chronic systolic heart failure (HCC)    Coronary artery disease involving native coronary artery of native heart with unstable angina pectoris (HCC)    Acute systolic heart failure (HCC) 05/13/2020   Acute on chronic systolic (congestive) heart  failure (HCC) 05/13/2020   Atrial fibrillation with RVR (HCC) 04/26/2020   HLD (hyperlipidemia) 04/26/2020   HTN (hypertension) 04/26/2020   Depression 04/26/2020   Gout 04/26/2020   Panic attacks 04/26/2020   Low back pain 04/26/2020    Patient Centered Plan: Patient is on the following Treatment Plan(s):    Behavioral Health Assessment  Patient Name Alexander [name] Date of Birth:  [birth year] Age:  48 y.o. Date of Interview:  07/05/23 Gender:  M   Date of Report : 07/06/23 Purpose:   Bariatric/Weight-loss Surgery (pre-operative evaluation)    Assessment Instruments:  DSM-5-TR Self-Rated Level 1 Cross-Cutting Symptom Measure--Adult Severity Measure for Generalized Anxiety Disorder--Adult EAT-26  Chief Complaint: Obesity  Client Background: Patient is a 47 seeking weight loss surgery. 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   The patient is 6 feet 1 inches tall and 325 lbs., reflecting a BMI of 42.9 classifying patient in the obese range and at further risk of co-morbid diseases.  Weight History: Patient reports that he continues to struggle with weight despite dietary changes and regular exercise.  Patient reports that he is going through the process for weight loss surgery in the past, but had to stabilize atrial fibrillation symptoms prior to getting the surgery.  Patient feels that his overall health is stable at this time and feels it is a good time to follow through.  Eating Patterns: Patient reports that he is trying to make healthier choices.  Patient made the statement " I know the right things to do".   Related Medical Issues: Patient has a history of obstructive sleep apnea, hypertension, atrial fibrillation, anxiety, depression, high cholesterol, allergies.  Patient is hoping that weight loss surgery will assist him in  making healthier life changes.    Family History of Obesity: Patient denies any family history of obesity  Tobacco Use: Patient denies tobacco use.   PATIENT BEHAVIORAL ASSESSMENT SCORES  Personal History of Mental Illness: Patient reports that depression and anxiety successfully treated with buproprion.  Mental Status Examination: Patient was oriented x5 (person, place, situation, time, and object). Patient was appropriately groomed, and neatly dressed. Patient was alert, engaged, pleasant, and cooperative. Patient denies suicidal and homicidal ideations or any perceptual disturbances. Patient denies self-injury.   DSM-5-TR Self-Rated Level 1 Cross-Cutting Symptom Measure--Adult: 9  Severity Measure for Generalized Anxiety Disorder--Adult: Patient completed a 10-question scale. Total scores can range from 0 to 40. A raw score is calculated by summing the answer to each question, and an average total score is achieved by dividing the raw score by the number of items (e.g., 10). Patient had a total raw score of 5 out of 40 which was divided by the total number of questions answered (10) to get an average score of 5 which indicates no significant anxiety.   EAT-26: The EAT-26 is a twenty-six-question screening tool to identify symptoms of eating disorders and disordered eating. The patient scored 3 out of 26. Scores below a 20 are considered not meeting criteria for disordered eating. Patient denies inducing vomiting, or intentional meal skipping.  Patient denies binge eating behaviors. Patient denies laxative abuse. Patient does not meet criteria for a DSM-V eating disorder.  Conclusion & Recommendations:   Health history and current assessment reflect that patient is suitable to be a candidate for bariatric surgery. Patient understands the procedure, the risks associated with it, and the importance of post-operative holistic care (Physical, Spiritual/Values, Relationships, and Mental/Emotional  health) with access to resources for support as needed. The patient has made an informed decision to proceed with procedure. The patient is motivated and expressed understanding of the post-surgical requirements. Patient's psychological assessment will be valid from today's date for 6 months (01/02/2024). After that date, a follow-up appointment will be needed to re-evaluate the patient's psychological status.   Clinician sees no significant psychological factors that would hinder the success of bariatric surgery at time of assessment. Clinician supports patient candidacy for Bariatric Surgery.   Rozanna Box, MSW, LCSW Licensed Clinical Social [facility]    Referrals to Alternative Service(s): Referred to Alternative Service(s):   Place:   Date:   Time:    Referred to Alternative Service(s):   Place:   Date:   Time:    Referred to Alternative Service(s):   Place:   Date:   Time:    Referred to Alternative Service(s):   Place:   Date:   Time:      Collaboration of Care: Other provider involved in patient's care AEB patient to continue outpatient care with bariatric team members.  Patient/Guardian was advised Release of Information must be obtained prior to any record release in order to collaborate their care with an outside provider. Patient/Guardian was advised if they have not already done so to contact the registration department to sign all necessary forms in order for [place] to release information regarding their care.   Consent: Patient/Guardian gives verbal consent for treatment and assignment of benefits for services provided during this visit. Patient/Guardian expressed understanding and agreed to proceed.   Blayne Garlick R Mohanad Carsten, LCSW

## 2023-07-10 ENCOUNTER — Encounter: Payer: Self-pay | Admitting: Dermatology

## 2023-07-10 ENCOUNTER — Ambulatory Visit: Payer: BC Managed Care – PPO | Admitting: Dermatology

## 2023-07-10 VITALS — BP 156/102

## 2023-07-10 DIAGNOSIS — D225 Melanocytic nevi of trunk: Secondary | ICD-10-CM

## 2023-07-10 DIAGNOSIS — L821 Other seborrheic keratosis: Secondary | ICD-10-CM

## 2023-07-10 DIAGNOSIS — Z1283 Encounter for screening for malignant neoplasm of skin: Secondary | ICD-10-CM | POA: Diagnosis not present

## 2023-07-10 DIAGNOSIS — L814 Other melanin hyperpigmentation: Secondary | ICD-10-CM | POA: Diagnosis not present

## 2023-07-10 DIAGNOSIS — L918 Other hypertrophic disorders of the skin: Secondary | ICD-10-CM

## 2023-07-10 DIAGNOSIS — L853 Xerosis cutis: Secondary | ICD-10-CM

## 2023-07-10 DIAGNOSIS — D485 Neoplasm of uncertain behavior of skin: Secondary | ICD-10-CM

## 2023-07-10 DIAGNOSIS — D1801 Hemangioma of skin and subcutaneous tissue: Secondary | ICD-10-CM | POA: Diagnosis not present

## 2023-07-10 DIAGNOSIS — D229 Melanocytic nevi, unspecified: Secondary | ICD-10-CM

## 2023-07-10 DIAGNOSIS — D492 Neoplasm of unspecified behavior of bone, soft tissue, and skin: Secondary | ICD-10-CM | POA: Diagnosis not present

## 2023-07-10 DIAGNOSIS — W908XXA Exposure to other nonionizing radiation, initial encounter: Secondary | ICD-10-CM

## 2023-07-10 DIAGNOSIS — L578 Other skin changes due to chronic exposure to nonionizing radiation: Secondary | ICD-10-CM

## 2023-07-10 NOTE — Patient Instructions (Addendum)

## 2023-07-10 NOTE — Progress Notes (Signed)
 Total Body Skin Exam (TBSE) Visit   Subjective  Alexander [name] is a 48 y.o. male who presents for the following: Skin Cancer Screening and Full Body Skin Exam  Patient presents today for follow up visit for TBSE. Patient was last evaluated on 04/11/23 . Patient denies medication changes. Patient reports he  does not have spots, moles and lesions of concern to be evaluated. Patient reports throughout his lifetime he  has had complete sun exposure. Currently, patient reports if he  has excessive sun exposure, he  does apply sunscreen and/or wears protective coverings. Patient reports he have hx of bx. Patient denies  family history of skin cancers. The patient has spots, moles and lesions to be evaluated, some may be new or changing and the patient has concerns that these could be cancer.  The following portions of the chart were reviewed this encounter and updated as appropriate: medications, allergies, medical history  Review of Systems:  No other skin or systemic complaints except as noted in HPI or Assessment and Plan.  Objective  Well appearing patient in no apparent distress; mood and affect are within normal limits.  A full examination was performed including scalp, head, eyes, ears, nose, lips, neck, chest, axillae, abdomen, back, buttocks, bilateral upper extremities, bilateral lower extremities, hands, feet, fingers, toes, fingernails, and toenails. All findings within normal limits unless otherwise noted below.   Relevant physical exam findings are noted in the Assessment and Plan.   Right paraspinal superior 1.2cm irregular brown papule Right paraspinal inferior 1.5 irregular brown macule Mid Back 9mm irregular macule  Assessment & Plan   LENTIGINES, SEBORRHEIC KERATOSES, HEMANGIOMAS - Benign normal skin lesions - Benign-appearing - Call for any changes  MELANOCYTIC NEVI - Tan-brown and/or pink-flesh-colored symmetric macules and papules - Benign appearing on exam  today - Observation - Call clinic for new or changing moles - Recommend daily use of broad spectrum spf 30+ sunscreen to sun-exposed areas.   ACTINIC DAMAGE - Chronic condition, secondary to cumulative UV/sun exposure - diffuse scaly erythematous macules with underlying dyspigmentation - Recommend daily broad spectrum sunscreen SPF 30+ to sun-exposed areas, reapply every 2 hours as needed.  - Staying in the shade or wearing long sleeves, sun glasses (UVA+UVB protection) and wide brim hats (4-inch brim around the entire circumference of the hat) are also recommended for sun protection.  - Call for new or changing lesions.  Acrochordons (Skin Tags) - Fleshy, skin-colored pedunculated papules - Benign appearing.  - Observe. - If desired, they can be removed with an in office procedure that is not covered by insurance. - Please call the clinic if you notice any new or changing lesions.  Xerosis - diffuse xerotic patches at the lower legs - recommend gentle, hydrating skin care - gentle skin care handout given   SKIN CANCER SCREENING PERFORMED TODAY.  NEOPLASM OF UNCERTAIN BEHAVIOR OF SKIN (3) Right paraspinal superior Epidermal / dermal shaving  Lesion diameter (cm):  1.2 Informed consent: discussed and consent obtained   Timeout: patient name, date of birth, surgical site, and procedure verified   Procedure prep:  Patient was prepped and draped in usual sterile fashion Prep type:  Isopropyl alcohol Anesthesia: the lesion was anesthetized in a standard fashion   Anesthetic:  1% lidocaine w/ epinephrine 1-100,000 buffered w/ 8.4% NaHCO3 Instrument used: flexible razor blade   Hemostasis achieved with: pressure, aluminum chloride and electrodesiccation   Outcome: patient tolerated procedure well   Post-procedure details: sterile dressing applied and wound care  instructions given   Dressing type: bandage and petrolatum   Specimen 1 - Surgical pathology Differential Diagnosis: r/o  DN  Check Margins: Yes Right paraspinal inferior Epidermal / dermal shaving  Lesion diameter (cm):  1.5 Informed consent: discussed and consent obtained   Timeout: patient name, date of birth, surgical site, and procedure verified   Procedure prep:  Patient was prepped and draped in usual sterile fashion Prep type:  Isopropyl alcohol Anesthesia: the lesion was anesthetized in a standard fashion   Anesthetic:  1% lidocaine w/ epinephrine 1-100,000 buffered w/ 8.4% NaHCO3 Instrument used: flexible razor blade   Hemostasis achieved with: pressure, aluminum chloride and electrodesiccation   Outcome: patient tolerated procedure well   Post-procedure details: sterile dressing applied and wound care instructions given   Dressing type: bandage and petrolatum   Specimen 2 - Surgical pathology Differential Diagnosis: r/o DN  Check Margins: Yes Mid Back Epidermal / dermal shaving  Lesion diameter (cm):  0.9 Informed consent: discussed and consent obtained   Timeout: patient name, date of birth, surgical site, and procedure verified   Procedure prep:  Patient was prepped and draped in usual sterile fashion Prep type:  Isopropyl alcohol Anesthesia: the lesion was anesthetized in a standard fashion   Anesthetic:  1% lidocaine w/ epinephrine 1-100,000 buffered w/ 8.4% NaHCO3 Instrument used: flexible razor blade   Hemostasis achieved with: pressure, aluminum chloride and electrodesiccation   Outcome: patient tolerated procedure well   Post-procedure details: sterile dressing applied and wound care instructions given   Dressing type: bandage and petrolatum   Specimen 3 - Surgical pathology Differential Diagnosis: r/o DN  Check Margins: Yes No follow-ups on file.    Documentation: I have reviewed the above documentation for accuracy and completeness, and I agree with the above.   I, Dekayla Prestridge Marcha Solders, CMA, am acting as scribe for [employer], DO.   Langston Reusing, DO

## 2023-07-11 LAB — SURGICAL PATHOLOGY

## 2023-07-12 ENCOUNTER — Encounter: Payer: Self-pay | Admitting: Dermatology

## 2023-07-12 DIAGNOSIS — D239 Other benign neoplasm of skin, unspecified: Secondary | ICD-10-CM | POA: Insufficient documentation

## 2023-07-12 HISTORY — DX: Other benign neoplasm of skin, unspecified: D23.9

## 2023-07-12 NOTE — Progress Notes (Signed)
 Hi Shirron,  Please call pt and notify that their bx results showed an abnormal mole that requires a full excision in office with Dr Caralyn Guile  Diagnosis 1. Skin , right paraspinal superior --> SE w/ Dr Demetrius Charity DYSPLASTIC NEVUS WITH MODERATE TO SEVERE ATYPIA, CLOSE TO MARGIN, SEE DESCRIPTION  2. Skin , right paraspinal inferior  --> SE w/ Dr Demetrius Charity DYSPLASTIC COMPOUND NEVUS WITH MODERATE ATYPIA, CLOSE TO MARGIN  3. Skin , mid back  --> SE w/ Dr Demetrius Charity DYSPLASTIC NEVUS WITH MODERATE TO SEVERE ATYPIA, CLOSE TO MARGIN, SEE DESCRIPTION

## 2023-07-19 ENCOUNTER — Other Ambulatory Visit (HOSPITAL_COMMUNITY): Payer: Self-pay | Admitting: Internal Medicine

## 2023-07-20 ENCOUNTER — Encounter: Payer: Self-pay | Admitting: Dermatology

## 2023-07-20 ENCOUNTER — Encounter: Payer: Self-pay | Admitting: Dietician

## 2023-07-20 ENCOUNTER — Encounter: Payer: BC Managed Care – PPO | Attending: General Surgery | Admitting: Dietician

## 2023-07-20 VITALS — Ht 73.0 in | Wt 329.1 lb

## 2023-07-20 DIAGNOSIS — E669 Obesity, unspecified: Secondary | ICD-10-CM | POA: Diagnosis present

## 2023-07-20 NOTE — Progress Notes (Signed)
 Supervised Weight Loss Visit Bariatric Nutrition Education Appt Start Time: [phone]    End Time: 1004  Planned surgery: Sleeve Gastrectomy Pt expectation of surgery: decrease medication; overall wellness  Referral stated Supervised Weight Loss (SWL) visits needed: 6 months  1 out of 6 SWL Appointments   NUTRITION ASSESSMENT   Anthropometrics  Start weight at [facility]: 329.2 lbs (date: 07/06/2023) Height: 73 in Weight today: 329.1 lbs BMI: 43.42 kg/m2    Clinical   Pharmacotherapy: History of weight loss medication used: He tried [place] for weight loss, but discontinued due to severe side effects.  Just started Zepbound two weeks ago, stating yesterday was his 3rd shot.  Medical hx: hypercholesterolemia, HTN, sleep apnea, obesity Medications: rosuvastatin, isosorbide, zepbound, loperamide, potassium chloride, Xarelto, spironolactone, furosemide, Entresto, dapagliflozin propanediol, doxazosin, nitroglycerin, multivitamin, acetaminophen, clopidogrel, carvedilol, tramadol, bupropion, allopurinol, colchicine Labs: most recent labs not in EMR from yesterday (01/30/2023: A1c 5.7; triglycerides 243; HDL 34; glucose 109 Notable signs/symptoms: none noted Any previous deficiencies? No  Lifestyle & Dietary Hx  Pt states he went up on Zepbound, stating from 2.5 to 5.0. Pt states he has not felt the urge to eat, stating it is really suppressing the appetite. Pt states he is not grazing between meals. Pt states the family has sat at the dining room table a few times to avoid distractions and slowing down at meal times. Pt states his wife is tracking her protein intake, stating he can track his at the same time. Pt states a big change with physical activity, stating he is going to the gym with his son 4-5 mornings per week, 45-60 minutes (cardio and weights).  Estimated daily fluid intake: 96 oz Supplements: multivitamin Current average weekly physical activity: ADLs, Gym, 4-5 mornings per week,  45-60 minutes (cardio and weights)  24-Hr Dietary Recall First Meal: coffee, smoothie (protein smoothie his wife makes) or protein shake Snack: granola bar Second Meal: left overs of spaghetti, or chicken and rice Snack: popcorn Third Meal: hot dogs or roast pork with vegetables with potatoes or fried rice. Snack: ice cream or popcorn or chips Beverages: coffee, water, bourbon, cyrstal light, orange juice, coke zero  Alcoholic beverages per week: 3-4   Estimated Energy Needs Calories: 1600   NUTRITION DIAGNOSIS  Overweight/obesity (Rosman-3.3) related to past poor dietary habits and physical inactivity as evidenced by patient w/ planned sleeve surgery following dietary guidelines for continued weight loss.  After bariatric surgery, it's crucial to prioritize protein intake at every meal and snack to support healing, maintain muscle mass, and promote weight loss. Practice aiming for 80 grams of protein per day, focusing on lean protein sources like eggs, poultry, fish, dairy.  NUTRITION INTERVENTION  Nutrition counseling (C-1) and education (E-2) to facilitate bariatric surgery goals.  Pre-Op Goals Reviewed with the Patient Nutrition: Healthy Eating Behaviors Switch to non-caloric, non-carbonated and non-caffeinated beverages such as  water, unsweetened tea, Crystal Light and zero calorie beverages (aim for 64 oz. per day) Cut out grazing between meals or at night  Find a protein shake you like Eat every 3-5 hours        Eliminate distractions while eating (TV, computer, reading, driving, texting) Take 96-04 minutes to eat a meal  Decrease high sugar foods/decrease high fat/fried foods Eliminate alcoholic beverages Increase protein intake (eggs, fish, chicken, yogurt) before surgery; track protein, aim for 80 grams per day. Eat non starchy vegetables 2 times a day 7 days a week Eat complex carbohydrates such as whole grains and fruits  Behavioral Modification: Physical Activity  Increase my usual daily activity (use stairs, park farther, etc.) Engage in _______________________  activity  _______ minutes ______ times per week  Other:    _________________________________________________________________       Pre-Op Goals Progress & New Goals New: track protein; aim for 80 grams per day. Continue: cut out grazing between meals or at night (Zepbound is helping with that, but still be mindful) Continue: take 20-30 minutes to eat a meal Continue: eliminate distractions while eating; continue sitting at the table as a family  Handouts Provided Include    Learning Style & Readiness for Change Teaching method utilized: Visual & Auditory  Demonstrated degree of understanding via: Teach Back  Readiness Level: preparation Barriers to learning/adherence to lifestyle change: nothing identified   RD's Notes for next Visit  Pt progress toward chosen goals.  MONITORING & EVALUATION Dietary intake, weekly physical activity, body weight, and pre-op goals in 1 month.  Next Steps  Patient is to return to [facility] in 1 month for next [facility] visit.

## 2023-07-21 ENCOUNTER — Ambulatory Visit: Payer: BC Managed Care – PPO | Attending: Cardiovascular Disease

## 2023-07-21 DIAGNOSIS — Z01810 Encounter for preprocedural cardiovascular examination: Secondary | ICD-10-CM | POA: Diagnosis not present

## 2023-07-21 NOTE — Progress Notes (Signed)
 Virtual Visit via Telephone Note   Because of Alexander [name] co-morbid illnesses, he is at least at moderate risk for complications without adequate follow up.  This format is felt to be most appropriate for this patient at this time.  Due to technical limitations with video connection (technology), today's appointment will be conducted as an audio only telehealth visit, and Alexander [name] verbally agreed to proceed in this manner.   All issues noted in this document were discussed and addressed.  No physical exam could be performed with this format.  Evaluation Performed:  Preoperative cardiovascular risk assessment _____________   Date:  07/21/2023   Patient ID:  Alexander [name], DOB 28-Feb-1976, MRN [id number] Patient Location:  Home Provider location:   Office  Primary Care Provider:  Halford Chessman, MD Primary Cardiologist:  Arvilla Meres, MD  Chief Complaint / Patient Profile   48 y.o. y/o male with a h/o morbid obesity, chronic systolic CHF, PAF who is pending bariatric gastric sleeve  and presents today for telephonic preoperative cardiovascular risk assessment.  History of Present Illness    Alexander [name] is a 48 y.o. male who presents via audio/video conferencing for a telehealth visit today.  Pt was last seen in cardiology clinic on 01/16/2023 by Dr. Gala Romney.  At that time Alexander [name] was doing well .  The patient is now pending procedure as outlined above. Since his last visit, he remains stable from a cardiac standpoint.  Today he denies chest pain, shortness of breath, lower extremity edema, fatigue, palpitations, melena, hematuria, hemoptysis, diaphoresis, weakness, presyncope, syncope, orthopnea, and PND.   Past Medical History    Past Medical History:  Diagnosis Date   Arrhythmia    Atrial fibrillation (HCC)    CAD (coronary artery disease) 08/17/2020   PCI + DES to pLCx, 70% mLAD negative FFR testing (medical therapy)    CHF (congestive heart failure) (HCC)     Chronic systolic heart failure (HCC)    Dysplastic nevi 07/12/2023   Severe atypia - right paraspinal superior - Ex needed Moderate - right paraspinal inferior - Ex needed Severe atypia - mid back - Ex needed     Gout    Hyperlipidemia    Hypertension    OSA (obstructive sleep apnea) 09/2020   Past Surgical History:  Procedure Laterality Date   CARDIAC CATHETERIZATION     CARDIOVERSION N/A 05/15/2020   Procedure: CARDIOVERSION;  Surgeon: Dolores Patty, MD;  Location: [facility] ENDOSCOPY;  Service: Cardiovascular;  Laterality: N/A;   CORONARY IMAGING/OCT N/A 07/13/2022   Procedure: INTRAVASCULAR IMAGING/OCT;  Surgeon: Marykay Lex, MD;  Location: [facility] INVASIVE CV LAB;  Service: Cardiovascular;  Laterality: N/A;   CORONARY STENT INTERVENTION N/A 08/17/2020   Procedure: CORONARY STENT INTERVENTION;  Surgeon: Kathleene Hazel, MD;  Location: [facility] INVASIVE CV LAB;  Service: Cardiovascular;  Laterality: N/A;   CORONARY STENT INTERVENTION N/A 07/13/2022   Procedure: CORONARY STENT INTERVENTION;  Surgeon: Marykay Lex, MD;  Location[facility] INVASIVE CV LAB;  Service: Cardiovascular;  Laterality: N/A;   LEFT HEART CATH AND CORONARY ANGIOGRAPHY N/A 07/13/2022   Procedure: LEFT HEART CATH AND CORONARY ANGIOGRAPHY;  Surgeon: Marykay Lex, MD;  Location: [facility] INVASIVE CV LAB;  Service: Cardiovascular;  Laterality: N/A;   RIGHT/LEFT HEART CATH AND CORONARY ANGIOGRAPHY N/A 08/17/2020   Procedure: RIGHT/LEFT HEART CATH AND CORONARY ANGIOGRAPHY;  Surgeon: Dolores Patty, MD;  Location: [facility] INVASIVE CV LAB;  Service: Cardiovascular;  Laterality: N/A;   SEPTOPLASTY  Allergies  Allergies  Allergen Reactions   Albuterol Anaphylaxis   Bee Venom Anaphylaxis   Metaproterenol Anaphylaxis   Other Anaphylaxis    DEET   Amoxicillin-Pot Clavulanate Itching    OK with Amoxicillin   Cephalexin Other (See Comments)    Chest tightness   Amoxicillin Itching and Rash    Itching all over       Home Medications    Prior to Admission medications   Medication Sig Start Date End Date Taking? Authorizing Provider  acetaminophen (TYLENOL) 500 MG tablet Take 500 mg by mouth every 6 (six) hours as needed for moderate pain.    [provider]  allopurinol (ZYLOPRIM) 100 MG tablet Take 100 mg by mouth daily. 02/13/20   [provider]  buPROPion (WELLBUTRIN XL) 300 MG 24 hr tablet Take 300 mg by mouth daily. 02/13/20   [provider]  carvedilol (COREG) 6.25 MG tablet TAKE 1 TABLET BY MOUTH 2 TIMES DAILY WITH A MEAL. 11/20/20   Bensimhon, Bevelyn Buckles, MD  clopidogrel (PLAVIX) 75 MG tablet Take 1 tablet (75 mg total) by mouth daily. 07/14/22   Azalee Course, PA  colchicine 0.6 MG tablet Take 0.6 mg by mouth 2 (two) times daily as needed (gout). 02/28/20   [provider]  dapagliflozin propanediol (FARXIGA) 10 MG TABS tablet Take 1 tablet (10 mg total) by mouth daily. 10/06/22   Chandrasekhar, Rondel Jumbo, MD  doxazosin (CARDURA) 2 MG tablet Take 1 tablet (2 mg total) by mouth at bedtime. 09/14/22   Milford, Anderson Malta, FNP  furosemide (LASIX) 20 MG tablet TAKE 1 TABLET BY MOUTH AS NEEDED FOR FLUID OR EDEMA 11/17/22   Quintella Reichert, MD  isosorbide mononitrate (IMDUR) 30 MG 24 hr tablet Take 1 tablet (30 mg total) by mouth daily. 06/07/23   Christell Constant, MD  loperamide (IMODIUM) 2 MG capsule Take 1 capsule (2 mg total) by mouth 4 (four) times daily as needed for diarrhea or loose stools. 04/12/23   Arletha Pili, DO  Multiple Vitamin (MULTIVITAMIN WITH MINERALS) TABS tablet Take 1 tablet by mouth daily.    [provider]  nitroGLYCERIN (NITROSTAT) 0.4 MG SL tablet Place 1 tablet (0.4 mg total) under the tongue every 5 (five) minutes as needed for chest pain. 07/14/22 07/14/23  Azalee Course, PA  potassium chloride SA (KLOR-CON M) 20 MEQ tablet Take 1 tablet (20 mEq total) by mouth as needed (with Lasix). 03/15/23   Chandrasekhar, Mahesh A, MD  rosuvastatin  (CRESTOR) 40 MG tablet TAKE 1 TABLET BY MOUTH EVERY DAY 04/05/23   Chandrasekhar, Mahesh A, MD  sacubitril-valsartan (ENTRESTO) 97-103 MG TAKE 1 TABLET BY MOUTH TWICE A DAY 11/11/22   Bensimhon, Bevelyn Buckles, MD  spironolactone (ALDACTONE) 25 MG tablet TAKE 1 TABLET (25 MG TOTAL) BY MOUTH DAILY. 12/12/22   Robbie Lis M, PA-C  traMADol (ULTRAM) 50 MG tablet Take 50 mg by mouth 3 (three) times daily as needed for moderate pain. 02/13/20   [provider]  XARELTO 20 MG TABS tablet TAKE 1 TABLET BY MOUTH EVERY DAY 07/20/23   Quintella Reichert, MD  ZEPBOUND 2.5 MG/0.5ML Pen Inject 2.5 mg into the skin once a week.    [provider]  digoxin (LANOXIN) 0.125 MG tablet Take 1 tablet (0.125 mg total) by mouth daily. 06/15/20 08/03/20  Allayne Butcher, PA-C    Physical Exam    Vital Signs:  Alexander [name] does not have vital signs available for review today.  Given telephonic nature of communication, physical exam is limited. AAOx3. NAD. Normal affect.  Speech and respirations are unlabored.  Accessory Clinical Findings    None  Assessment & Plan    1.  Preoperative Cardiovascular Risk Assessment: Procedure:   BARIATRIC GASTRIC SLEEVE    Date of Surgery:  Clearance TBD                                  Surgeon:  DR. Feliciana Rossetti Surgeon's Group or Practice Name:  [facility] Phone number:  [phone] Fax number:  [phone] [place] HALE, BARIATRIC NAVIGATOR      Primary Cardiologist: Arvilla Meres, MD  Chart reviewed as part of pre-operative protocol coverage. Given past medical history and time since last visit, based on ACC/AHA guidelines, Alexander [name] would be at acceptable risk for the planned procedure without further cardiovascular testing.   His RCRI is moderate risk, 6.6% risk of major cardiac event.  He is able to complete greater than 4 METS of physical activity.  Patient was advised that if he develops new symptoms prior to surgery to contact our  office to arrange a follow-up appointment.  He verbalized understanding.  Per office protocol, patient can hold Xarelto for 2 days prior to procedure. Per cath instructions Plavix should have been discontinued at six months post intervention.  Patient was instructed that he may discontinue Plavix.  I will route this recommendation to the requesting party via Epic fax function and remove from pre-op pool.        Time:   Today, I have spent 5 minutes with the patient with telehealth technology discussing medical history, symptoms, and management plan.  I spent 10 minutes reviewing patient's past medical history, cardiac medications, and cardiac testing.   Ronney Asters, NP  07/21/2023, 7:17 AM

## 2023-07-25 ENCOUNTER — Encounter: Payer: Self-pay | Admitting: Dermatology

## 2023-07-27 ENCOUNTER — Encounter: Payer: Self-pay | Admitting: Dermatology

## 2023-07-27 ENCOUNTER — Ambulatory Visit: Admitting: Dermatology

## 2023-07-27 VITALS — BP 137/83

## 2023-07-27 DIAGNOSIS — D235 Other benign neoplasm of skin of trunk: Secondary | ICD-10-CM

## 2023-07-27 DIAGNOSIS — D239 Other benign neoplasm of skin, unspecified: Secondary | ICD-10-CM

## 2023-07-27 HISTORY — PX: MOLE REMOVAL: SHX2046

## 2023-07-27 NOTE — Patient Instructions (Signed)

## 2023-07-27 NOTE — Progress Notes (Signed)
 Follow-Up Visit   Subjective  Alexander [name] is a 48 y.o. male who presents for the following: Excision of a DN moderate to severe on the right paraspinal superior, biopsied by Dr. Onalee Hua.  The following portions of the chart were reviewed this encounter and updated as appropriate: medications, allergies, medical history  Review of Systems:  No other skin or systemic complaints except as noted in HPI or Assessment and Plan.  Objective  Well appearing patient in no apparent distress; mood and affect are within normal limits.  A focused examination was performed of the following areas: Right paraspinal superior Relevant physical exam findings are noted in the Assessment and Plan.   right paraspinal superior Healing biopsy site   Assessment & Plan   DYSPLASTIC NEVUS right paraspinal superior Skin excision - right paraspinal superior  Excision method:  elliptical Lesion length (cm):  2.1 Lesion width (cm):  1.5 Margin per side (cm):  0.5 Total excision diameter (cm):  3.1 Informed consent: discussed and consent obtained   Timeout: patient name, date of birth, surgical site, and procedure verified   Procedure prep:  Patient was prepped and draped in usual sterile fashion Prep type:  Chlorhexidine Anesthesia: the lesion was anesthetized in a standard fashion   Anesthetic:  1% lidocaine w/ epinephrine 1-100,000 buffered w/ 8.4% NaHCO3 Instrument used: #15 blade   Hemostasis achieved with: suture, pressure and electrodesiccation   Hemostasis achieved with comment:  3.0 PDS with dermabond and steri strips Outcome: patient tolerated procedure well with no complications   Post-procedure details: sterile dressing applied and wound care instructions given   Dressing type: bandage and pressure dressing   Additional details:  Final length 5.2  Skin repair - right paraspinal superior Complexity:  Complex Final length (cm):  5.2 Informed consent: discussed and consent obtained    Timeout: patient name, date of birth, surgical site, and procedure verified   Procedure prep:  Patient was prepped and draped in usual sterile fashion Prep type:  Chlorhexidine Anesthesia: the lesion was anesthetized in a standard fashion   Anesthetic:  1% lidocaine w/ epinephrine 1-100,000 buffered w/ 8.4% NaHCO3 Reason for type of repair: reduce tension to allow closure, allow closure of the large defect, preserve normal anatomy and compensate for the inelasticity of skin in this area   Undermining: area extensively undermined   Subcutaneous layers (deep stitches):  Suture size:  3-0 Suture type: PDS (polydioxanone)   Stitches:  Buried vertical mattress Fine/surface layer approximation (top stitches):  Suture type: cyanoacrylate tissue glue   Hemostasis achieved with: suture, pressure and electrodesiccation Outcome: patient tolerated procedure well with no complications   Post-procedure details: sterile dressing applied and wound care instructions given   Dressing type: bandage and pressure dressing   Specimen 1 - Surgical pathology Differential Diagnosis: DN [id number] Check Margins: No DYSPLASTIC NEVI    The surgical wound was then cleaned, prepped, and re-anesthetized as above. Wound edges were undermined extensively along at least one entire edge and at a distance equal to or greater than the width of the defect (see wound defect size above) in order to achieve closure and decrease wound tension and anatomic distortion. Redundant tissue repair including standing cone removal was performed. Hemostasis was achieved with electrocautery. Subcutaneous and epidermal tissues were approximated with the above sutures. The surgical site was then lightly scrubbed with sterile, saline-soaked gauze. Steri-strips were applied, and the area was then bandaged using Vaseline ointment, non-adherent gauze, gauze pads, and tape to provide an adequate pressure  dressing. The patient tolerated the  procedure well, was given detailed written and verbal wound care instructions, and was discharged in good condition.   The patient will follow-up: 2 weeks for next DN excision.  Return in about 2 weeks (around 08/10/2023).  I, Tillie Fantasia, CMA, am acting as scribe for Gwenith Daily, MD.   Documentation: I have reviewed the above documentation for accuracy and completeness, and I agree with the above.  Gwenith Daily, MD

## 2023-07-28 ENCOUNTER — Encounter (HOSPITAL_COMMUNITY): Payer: Self-pay | Admitting: *Deleted

## 2023-07-28 ENCOUNTER — Ambulatory Visit (HOSPITAL_COMMUNITY)
Admission: EM | Admit: 2023-07-28 | Discharge: 2023-07-28 | Disposition: A | Discharge status: Home / Self Care | Attending: Family Medicine | Admitting: Family Medicine

## 2023-07-28 DIAGNOSIS — Z5189 Encounter for other specified aftercare: Secondary | ICD-10-CM | POA: Diagnosis not present

## 2023-07-28 NOTE — ED Provider Notes (Signed)
 [facility]    CSN: [id number] Arrival date & time: 07/28/23  4098      History   Chief Complaint Chief Complaint  Patient presents with   Wound Check    HPI Alexander [name] is a 48 y.o. male.    Wound Check  Here for recheck of his wound on his back.  Yesterday he had an excision of a lesion on his back with dermatology.  The edges of the wound had to be undermined and he had a subcu stitch placed and then glue and Steri-Strips.  This morning in the mirror he could see that there was dried blood or discharge on the bandage so he came here for [place] to check it.  His wife is at work and could not help him see what was going on.  He is supposed to be taking Xarelto, but he had actually missed this morning and yesterday's dose due to getting his prescription refilled.  He is allergic to Keflex and amoxicillin and albuterol  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/12/2023   Severe atypia - right paraspinal superior - Ex needed Moderate - right paraspinal inferior - Ex needed Severe atypia - mid back - Ex needed     Gout    Hyperlipidemia    Hypertension    OSA (obstructive sleep apnea) 09/2020    Patient Active Problem List   Diagnosis Date Noted   Dysplastic nevi 07/12/2023   Paroxysmal atrial fibrillation (HCC) 09/19/2022   Unstable angina (HCC) 07/12/2022   Morbid obesity (HCC) 06/17/2021   Chronic systolic heart failure (HCC)    Coronary artery disease involving native coronary artery of native heart with unstable angina pectoris (HCC)    Acute systolic heart failure (HCC) 05/13/2020   Acute on chronic systolic (congestive) heart failure (HCC) 05/13/2020   Atrial fibrillation with RVR (HCC) 04/26/2020    HLD (hyperlipidemia) 04/26/2020   HTN (hypertension) 04/26/2020   Depression 04/26/2020   Gout 04/26/2020   Panic attacks 04/26/2020   Low back pain 04/26/2020    Past Surgical History:  Procedure Laterality Date   CARDIAC CATHETERIZATION     CARDIOVERSION N/A 05/15/2020   Procedure: CARDIOVERSION;  Surgeon: Dolores Patty, MD;  Location: [facility] ENDOSCOPY;  Service: Cardiovascular;  Laterality: N/A;   CORONARY IMAGING/OCT N/A 07/13/2022   Procedure: INTRAVASCULAR IMAGING/OCT;  Surgeon: Marykay Lex, MD;  Location: [facility] INVASIVE CV LAB;  Service: Cardiovascular;  Laterality: N/A;   CORONARY STENT INTERVENTION N/A 08/17/2020   Procedure: CORONARY STENT INTERVENTION;  Surgeon: Kathleene Hazel, MD;  Location: [facility]VASIVE CV LAB;  Service: Cardiovascular;  Laterality: N/A;   CORONARY STENT INTERVENTION N/A 07/13/2022   Procedure: CORONARY STENT INTERVENTION;  Surgeon: Marykay Lex, MD;  Location: [facility] INVASIVE CV LAB;  Service: Cardiovascular;  Laterality: N/A;   LEFT HEART CATH AND CORONARY ANGIOGRAPHY N/A 07/13/2022   Procedure: LEFT HEART CATH AND CORONARY ANGIOGRAPHY;  Surgeon: Marykay Lex, MD;  Location: [facility] INVASIVE CV LAB;  Service: Cardiovascular;  Laterality: N/A;   MOLE REMOVAL  07/27/2023   RIGHT/LEFT HEART CATH AND CORONARY ANGIOGRAPHY N/A 08/17/2020   Procedure: RIGHT/LEFT HEART CATH AND CORONARY ANGIOGRAPHY;  Surgeon: Arvilla Meres  R, MD;  Location: [facility]VASIVE CV LAB;  Service: Cardiovascular;  Laterality: N/A;   SEPTOPLASTY         Home Medications    Prior to Admission medications   Medication Sig Start Date End Date Taking? Authorizing Provider  allopurinol (ZYLOPRIM) 100 MG tablet Take 100 mg by mouth daily. 02/13/20  Yes [provider]  buPROPion (WELLBUTRIN XL) 300 MG 24 hr tablet Take 300 mg by mouth daily. 02/13/20  Yes [provider]  carvedilol (COREG) 6.25 MG tablet TAKE 1 TABLET BY MOUTH 2 TIMES DAILY WITH A MEAL.  11/20/20  Yes Bensimhon, Bevelyn Buckles, MD  dapagliflozin propanediol (FARXIGA) 10 MG TABS tablet Take 1 tablet (10 mg total) by mouth daily. 10/06/22  Yes Chandrasekhar, Mahesh A, MD  doxazosin (CARDURA) 2 MG tablet Take 1 tablet (2 mg total) by mouth at bedtime. 09/14/22  Yes Milford, Anderson Malta, FNP  furosemide (LASIX) 20 MG tablet TAKE 1 TABLET BY MOUTH AS NEEDED FOR FLUID OR EDEMA 11/17/22  Yes Turner, Cornelious Bryant, MD  isosorbide mononitrate (IMDUR) 30 MG 24 hr tablet Take 1 tablet (30 mg total) by mouth daily. 06/07/23  Yes Chandrasekhar, Mahesh A, MD  Multiple Vitamin (MULTIVITAMIN WITH MINERALS) TABS tablet Take 1 tablet by mouth daily.   Yes [provider]  rosuvastatin (CRESTOR) 40 MG tablet TAKE 1 TABLET BY MOUTH EVERY DAY 04/05/23  Yes Chandrasekhar, Mahesh A, MD  sacubitril-valsartan (ENTRESTO) 97-103 MG TAKE 1 TABLET BY MOUTH TWICE A DAY 11/11/22  Yes Bensimhon, Bevelyn Buckles, MD  spironolactone (ALDACTONE) 25 MG tablet TAKE 1 TABLET (25 MG TOTAL) BY MOUTH DAILY. 12/12/22  Yes Robbie Lis M, PA-C  traMADol (ULTRAM) 50 MG tablet Take 50 mg by mouth 3 (three) times daily as needed for moderate pain. 02/13/20  Yes [provider]  XARELTO 20 MG TABS tablet TAKE 1 TABLET BY MOUTH EVERY DAY 07/20/23  Yes Turner, Traci R, MD  ZEPBOUND 2.5 MG/0.5ML Pen Inject 2.5 mg into the skin once a week.   Yes [provider]  acetaminophen (TYLENOL) 500 MG tablet Take 500 mg by mouth every 6 (six) hours as needed for moderate pain.    [provider]  colchicine 0.6 MG tablet Take 0.6 mg by mouth 2 (two) times daily as needed (gout). 02/28/20   [provider]  loperamide (IMODIUM) 2 MG capsule Take 1 capsule (2 mg total) by mouth 4 (four) times daily as needed for diarrhea or loose stools. 04/12/23   Arletha Pili, DO  nitroGLYCERIN (NITROSTAT) 0.4 MG SL tablet Place 1 tablet (0.4 mg total) under the tongue every 5 (five) minutes as needed for chest pain. 07/14/22 07/14/23  Azalee Course, PA  potassium chloride SA (KLOR-CON M) 20 MEQ tablet Take 1 tablet (20 mEq total) by mouth as needed (with Lasix). 03/15/23   Chandrasekhar, Rondel Jumbo, MD  digoxin (LANOXIN) 0.125 MG tablet Take 1 tablet (0.125 mg total) by mouth daily. 06/15/20 08/03/20  Allayne Butcher, PA-C    Family History Family History  Problem Relation Age of Onset   COPD Mother     Social History Social History   Tobacco Use   Smoking status: Former    Types: Cigars    Quit date: 08/14/2020    Years since quitting: 2.9   Smokeless tobacco: Never  Substance Use Topics   Alcohol use: Yes    Alcohol/week: 1.0 standard drink of alcohol    Types: 1 Cans of beer per  week    Comment: drinks beer socially 1 per week   Drug use: Not Currently     Allergies   Albuterol, Bee venom, Metaproterenol, Other, Amoxicillin-pot clavulanate, Cephalexin, and Amoxicillin   Review of Systems Review of Systems   Physical Exam Triage Vital Signs ED Triage Vitals  Encounter Vitals Group     BP 07/28/23 0952 (!) 143/87     Systolic BP Percentile --      Diastolic BP Percentile --      Pulse Rate 07/28/23 0952 70     Resp 07/28/23 0952 20     Temp 07/28/23 0952 (!) 97.4 F (36.3 C)     Temp Source 07/28/23 0952 Oral     SpO2 07/28/23 0952 93 %     Weight --      Height --      Head Circumference --      Peak Flow --      Pain Score 07/28/23 0949 0     Pain Loc --      Pain Education --      Exclude from Growth Chart --    No data found.  Updated Vital Signs BP (!) 143/87 (BP Location: Left Arm)   Pulse 70   Temp (!) 97.4 F (36.3 C) (Oral)   Resp 20   SpO2 93%   Visual Acuity Right Eye Distance:   Left Eye Distance:   Bilateral Distance:    Right Eye Near:   Left Eye Near:    Bilateral Near:     Physical Exam Vitals reviewed.  Constitutional:      General: He is not in acute distress.    Appearance: He is not toxic-appearing.  Skin:    Coloration: Skin is not pale.     Comments:  There is a bandage with dried blood on it on his upper back.  That is removed and there is bright red blood on the Steri-Strips.  I therefore had to remove all of that.  It looks like the glue probably came off for the Steri-Strips.  There was oozing blood very slowly from about the middle of the incision.  Pressure was held on it for about 5 minutes.  It still continued to ooze drop very slowly from that area.  There is no swelling underneath this area.  Neurological:     Mental Status: He is alert and oriented to person, place, and time.  Psychiatric:        Behavior: Behavior normal.      UC Treatments / Results  Labs (all labs ordered are listed, but only abnormal results are displayed) Labs Reviewed - No data to display  EKG   Radiology No results found.  Procedures Procedures (including critical care time)  Medications Ordered in UC Medications - No data to display  Initial Impression / Assessment and Plan / UC Course  I have reviewed the triage vital signs and the nursing notes.  Pertinent labs & imaging results that were available during my care of the patient were reviewed by me and considered in my medical decision making (see chart for details).     I reapplied Steri-Strips due to the patient's concern that those were not supposed of come off for another 10 days.  Pressure bandage was applied as best I could.  He is given more bandage material.  He will follow-up with dermatology.  They were apparently unavailable to answer his questions today. Final Clinical Impressions(s) / UC Diagnoses  Final diagnoses:  Visit for wound check     Discharge Instructions      Keep pressure bandage on the wound. Limit strenuous activities using your arms.    ED Prescriptions   None    PDMP not reviewed this encounter.   Zenia Resides, MD 07/28/23 1520

## 2023-07-28 NOTE — Discharge Instructions (Signed)
 Keep pressure bandage on the wound. Limit strenuous activities using your arms.

## 2023-07-28 NOTE — ED Triage Notes (Signed)
 Pt states he had a mole removed yesterday at derm, and their office is closed today. He thinks maybe he opened the wound but since its on his back he can't see it. He states he has been holding his blood thinners X 2 days due to not having a med refill.

## 2023-07-31 ENCOUNTER — Encounter: Payer: Self-pay | Admitting: Dermatology

## 2023-07-31 ENCOUNTER — Ambulatory Visit (INDEPENDENT_AMBULATORY_CARE_PROVIDER_SITE_OTHER): Admitting: Dermatology

## 2023-07-31 DIAGNOSIS — D239 Other benign neoplasm of skin, unspecified: Secondary | ICD-10-CM

## 2023-07-31 DIAGNOSIS — Z48817 Encounter for surgical aftercare following surgery on the skin and subcutaneous tissue: Secondary | ICD-10-CM

## 2023-07-31 DIAGNOSIS — T1490XD Injury, unspecified, subsequent encounter: Secondary | ICD-10-CM

## 2023-07-31 DIAGNOSIS — Z86018 Personal history of other benign neoplasm: Secondary | ICD-10-CM

## 2023-07-31 NOTE — Progress Notes (Signed)
   Follow Up Visit   Subjective  Alexander [name] is a 48 y.o. 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 The patient has been bandaging the wound as directed. The endorse the following concerns: bleeding  States that he had some bleeding into his pressure bandage and went to urgent care. They removed and replaced steri strips and he has not had any issues since then.   The following portions of the chart were reviewed this encounter and updated as appropriate: medications, allergies, medical history  Review of Systems:  No other skin or systemic complaints except as noted in HPI or Assessment and Plan.  Objective  Well appearing patient in no apparent distress; mood and affect are within normal limits.  A full examination was performed including scalp, head, face and right paraspinal-superior. All findings within normal limits unless otherwise noted below.  Healing wound with mild erythema on right upper back  Relevant physical exam findings are noted in the Assessment and Plan.    Assessment & Plan   Healing s/p excision for DN on right upper back, treated on 07/27/23, repaired with linear closure - Reassured that wound is healing well - No evidence of infection - No swelling, induration, purulence, dehiscence, or tenderness out of proportion to the clinical exam, see photo above - Discussed that scars take up to 12 months to mature from the date of surgery - Recommend SPF 30+ to scar daily to prevent purple color from UV exposure during scar maturation process - Discussed that erythema and raised appearance of scar will fade over the next 4-6 months - OK to start scar massage at 4-6 weeks post-op - Can consider silicone based products for scar healing starting at 6 weeks post-op - Added more steri strips  History  of Dysplastic Nevi - No evidence of recurrence today - Recommend regular full body skin exams - Recommend daily broad spectrum sunscreen SPF 30+ to sun-exposed areas, reapply every 2 hours as needed.  - Call if any new or changing lesions are noted between office visits  Return for based on next scheduled follow up appointment.  I, Tillie Fantasia, CMA, am acting as scribe for Gwenith Daily, MD.   Documentation: I have reviewed the above documentation for accuracy and completeness, and I agree with the above.  Gwenith Daily, MD

## 2023-07-31 NOTE — Patient Instructions (Signed)

## 2023-08-01 LAB — SURGICAL PATHOLOGY

## 2023-08-14 ENCOUNTER — Ambulatory Visit: Admitting: Dermatology

## 2023-08-17 ENCOUNTER — Encounter: Payer: Self-pay | Admitting: Dietician

## 2023-08-17 ENCOUNTER — Encounter: Payer: BC Managed Care – PPO | Attending: Family Medicine | Admitting: Dietician

## 2023-08-17 VITALS — Ht 73.0 in | Wt 319.9 lb

## 2023-08-17 DIAGNOSIS — E669 Obesity, unspecified: Secondary | ICD-10-CM | POA: Insufficient documentation

## 2023-08-17 NOTE — Progress Notes (Signed)
 Supervised Weight Loss Visit Bariatric Nutrition Education Appt Start Time: 0830    End Time: 0859  Planned surgery: Sleeve Gastrectomy Pt expectation of surgery: decrease medication; overall wellness  Referral stated Supervised Weight Loss (SWL) visits needed: 6 months  2 out of 6 SWL Appointments   NUTRITION ASSESSMENT   Anthropometrics  Start weight at [facility]: 329.2 lbs (date: 07/06/2023) Height: 73 in Weight today: 319.9 lbs BMI: 42.21 kg/m2    Clinical   Pharmacotherapy: History of weight loss medication used: He tried [place] for weight loss, but discontinued due to severe side effects.  Just started Zepbound two weeks ago, stating yesterday was his 3rd shot.  Medical hx: hypercholesterolemia, HTN, sleep apnea, obesity Medications: rosuvastatin, isosorbide, zepbound, loperamide, potassium chloride, Xarelto, spironolactone, furosemide, Entresto, dapagliflozin propanediol, doxazosin, nitroglycerin, multivitamin, acetaminophen, clopidogrel, carvedilol, tramadol, bupropion, allopurinol, colchicine Labs: most recent labs not in EMR from yesterday (01/30/2023: A1c 5.7; triglycerides 243; HDL 34; glucose 109 Notable signs/symptoms: none noted Any previous deficiencies? No  Lifestyle & Dietary Hx  Pt states the zepbound has no side effects, stating it has eliminated food noise, stating he doesn't get extremely hungry. Pt states he is getting 90-140 grams of protein per day, stating he will get to 80 grams without trying too much. Pt states he quit soda about a month and a half ago, stating even the zero sugar sodas. Pt states he has switched to avocado oil. Pt states he is getting a consitant 5 days in the gym, stating he is going with his son every time.  Estimated daily fluid intake: 96 oz Supplements: multivitamin Current average weekly physical activity: ADLs, Gym, 5 mornings per week, 45-60 minutes (cardio and weights)  24-Hr Dietary Recall First Meal: protein shake, Oikos  yogurt Snack: granola bar Second Meal: left overs of spaghetti, or chicken and rice bowl or chicken alfredo Snack: popcorn Third Meal: grilled chicken or fish, broccoli or asparagus and rice or baked potato or lentils or pasta. Snack: ice cream or popcorn or chips Beverages: coffee, water, bourbon, cyrstal light, orange juice, coke zero  Alcoholic beverages per week: 3-4   Estimated Energy Needs Calories: 1600  NUTRITION DIAGNOSIS  Overweight/obesity (Keller-3.3) related to past poor dietary habits and physical inactivity as evidenced by patient w/ planned sleeve surgery following dietary guidelines for continued weight loss.  After bariatric surgery, it's crucial to prioritize protein intake at every meal and snack to support healing, maintain muscle mass, and promote weight loss. Practice aiming for 80 grams of protein per day, focusing on lean protein sources like eggs, poultry, fish, dairy.  NUTRITION INTERVENTION  Nutrition counseling (C-1) and education (E-2) to facilitate bariatric surgery goals.  Encouraged patient to honor their body's internal hunger and fullness cues.  Throughout the day, check in mentally and rate hunger. Stop eating when satisfied not full regardless of how much food is left on the plate.  Get more if still hungry 20-30 minutes later.  The key is to honor satisfaction so throughout the meal, rate fullness factor and stop when comfortably satisfied not physically full. The key is to honor hunger and fullness without any feelings of guilt or shame.  Pay attention to what the internal cues are, rather than any external factors. This will enhance the confidence you have in listening to your own body and following those internal cues enabling you to increase how often you eat when you are hungry not out of appetite and stop when you are satisfied not full.  Encouraged pt  to continue to eat balanced meals inclusive of non starchy vegetables 2 or more times a day 7 days a  week Encouraged pt to choose lean protein sources: limiting beef, pork, sausage, hotdogs, and lunch meat Encourage pt to choose healthy fats such as plant based limiting animal fats Encouraged pt to continue to drink a minium 64 fluid ounces with half being plain water to satisfy proper hydration    Pre-Op Goals Reviewed with the Patient  Pre-Op Goals Progress & New Goals Continue: track protein; aim for 80 grams per day; aim for a protein with every meal and snack; aim for protein from food Continue: cut out grazing between meals or at night (Zepbound is helping with that, but still be mindful); schedule meals and snacks Continue: take 20-30 minutes to eat a meal Continue: eliminate distractions while eating; continue sitting at the table as a family New: using the bariatric MyPlate method, increase non-starchy vegetables (aim for 2 or more servings per day)  Handouts Provided Include  Goals Printed Bariatric MyPlate  Learning Style & Readiness for Change Teaching method utilized: Visual & Auditory  Demonstrated degree of understanding via: Teach Back  Readiness Level: preparation Barriers to learning/adherence to lifestyle change: nothing identified   RD's Notes for next Visit  Pt progress toward chosen goals.  MONITORING & EVALUATION Dietary intake, weekly physical activity, body weight, and pre-op goals in 1 month.  Next Steps  Patient is to return to [facility] in 1 month for next [facility] visit.

## 2023-08-22 ENCOUNTER — Encounter: Payer: Self-pay | Admitting: Dermatology

## 2023-08-24 ENCOUNTER — Encounter: Payer: Self-pay | Admitting: Dermatology

## 2023-08-24 ENCOUNTER — Ambulatory Visit: Admitting: Dermatology

## 2023-08-24 VITALS — BP 137/89 | HR 81 | Temp 98.2°F

## 2023-08-24 DIAGNOSIS — L905 Scar conditions and fibrosis of skin: Secondary | ICD-10-CM

## 2023-08-24 DIAGNOSIS — D235 Other benign neoplasm of skin of trunk: Secondary | ICD-10-CM

## 2023-08-24 DIAGNOSIS — D239 Other benign neoplasm of skin, unspecified: Secondary | ICD-10-CM

## 2023-08-24 DIAGNOSIS — Z86018 Personal history of other benign neoplasm: Secondary | ICD-10-CM

## 2023-08-24 NOTE — Patient Instructions (Signed)

## 2023-08-24 NOTE — Progress Notes (Signed)
 Follow-Up Visit   Subjective  Alexander [name] is a 48 y.o. male who presents for the following: Excision of a Dysplastic Nevus with Moderate Atypia on the right paraspinal inferior region.   He is s/p WLE for a DN moderate to severe on the right superior paraspinal region and states he feels it is healing well.   The following portions of the chart were reviewed this encounter and updated as appropriate: medications, allergies, medical history  Review of Systems:  No other skin or systemic complaints except as noted in HPI or Assessment and Plan.  Objective  Well appearing patient in no apparent distress; mood and affect are within normal limits.  A focused examination was performed of the following areas: Right paraspinal inferior Relevant physical exam findings are noted in the Assessment and Plan.   right paraspinal inferior Healing biopsy site  Assessment & Plan   DYSPLASTIC NEVUS right paraspinal inferior Skin excision - right paraspinal inferior  Excision method:  elliptical Lesion length (cm):  1.3 Lesion width (cm):  1.6 Margin per side (cm):  0.4 Total excision diameter (cm):  2.4 Informed consent: discussed and consent obtained   Timeout: patient name, date of birth, surgical site, and procedure verified   Procedure prep:  Patient was prepped and draped in usual sterile fashion Prep type:  Chlorhexidine Anesthesia: the lesion was anesthetized in a standard fashion   Anesthetic:  1% lidocaine w/ epinephrine 1-100,000 buffered w/ 8.4% NaHCO3 Instrument used: #15 blade   Hemostasis achieved with: suture, pressure and electrodesiccation   Hemostasis achieved with comment:  3.0 PDS with dermabond and steri strips Outcome: patient tolerated procedure well with no complications   Post-procedure details: sterile dressing applied and wound care instructions given   Dressing type: bandage and pressure dressing   Additional details:  Final length 6.2  Skin repair -  right paraspinal inferior Complexity:  Complex Final length (cm):  6.2 Informed consent: discussed and consent obtained   Timeout: patient name, date of birth, surgical site, and procedure verified   Procedure prep:  Patient was prepped and draped in usual sterile fashion Prep type:  Chlorhexidine Anesthesia: the lesion was anesthetized in a standard fashion   Anesthetic:  1% lidocaine w/ epinephrine 1-100,000 buffered w/ 8.4% NaHCO3 Reason for type of repair: reduce tension to allow closure, allow closure of the large defect, preserve normal anatomy, preserve normal anatomical and functional relationships and allow side-to-side closure without requiring a flap or graft   Undermining: area extensively undermined   Subcutaneous layers (deep stitches):  Suture size:  3-0 Suture type: PDS (polydioxanone)   Stitches:  Buried vertical mattress Fine/surface layer approximation (top stitches):  Suture type: cyanoacrylate tissue glue   Hemostasis achieved with: suture, pressure and electrodesiccation Outcome: patient tolerated procedure well with no complications   Post-procedure details: sterile dressing applied and wound care instructions given   Dressing type: bandage and pressure dressing   Specimen 1 - Surgical pathology Differential Diagnosis: DN [phone] Check Margins: No SCAR    The surgical wound was then cleaned, prepped, and re-anesthetized as above. Wound edges were undermined extensively along at least one entire edge and at a distance equal to or greater than the width of the defect (see wound defect size above) in order to achieve closure and decrease wound tension and anatomic distortion. Redundant tissue repair including standing cone removal was performed. Hemostasis was achieved with electrocautery. Subcutaneous and epidermal tissues were approximated with the above sutures. The surgical site was then  lightly scrubbed with sterile, saline-soaked gauze. Steri-strips were  applied, and the area was then bandaged using Vaseline ointment, non-adherent gauze, gauze pads, and tape to provide an adequate pressure dressing. The patient tolerated the procedure well, was given detailed written and verbal wound care instructions, and was discharged in good condition.   The patient will follow-up: 2 weeks.  Scar s/p WLE for DN severe on right superior paraspinal, treated on 03/202/2025, repaired with linear closure - Reassured that wound has healed well - Discussed that scars take up to 12 months to mature from the date of surgery - Recommend SPF 30+ to scar daily to prevent purple color - OK to start scar massage at 4-6 weeks post-op - Can consider silicone based products for scar healing  History of Dysplastic Nevi - No evidence of recurrence today - Recommend regular full body skin exams - Recommend daily broad spectrum sunscreen SPF 30+ to sun-exposed areas, reapply every 2 hours as needed.  - Call if any new or changing lesions are noted between office visits   Return in about 2 weeks (around 09/07/2023).  I, Wilson Hasten, CMA, am acting as scribe for Deneise Finlay, MD. .   Documentation: I have reviewed the above documentation for accuracy and completeness, and I agree with the above.  Deneise Finlay, MD

## 2023-08-31 LAB — SURGICAL PATHOLOGY

## 2023-09-05 ENCOUNTER — Encounter: Payer: Self-pay | Admitting: Dermatology

## 2023-09-07 ENCOUNTER — Encounter: Payer: Self-pay | Admitting: Dermatology

## 2023-09-07 ENCOUNTER — Ambulatory Visit: Admitting: Dermatology

## 2023-09-07 VITALS — BP 139/85 | HR 78

## 2023-09-07 DIAGNOSIS — Z86018 Personal history of other benign neoplasm: Secondary | ICD-10-CM

## 2023-09-07 DIAGNOSIS — L905 Scar conditions and fibrosis of skin: Secondary | ICD-10-CM | POA: Diagnosis not present

## 2023-09-07 DIAGNOSIS — D235 Other benign neoplasm of skin of trunk: Secondary | ICD-10-CM | POA: Diagnosis not present

## 2023-09-07 DIAGNOSIS — D239 Other benign neoplasm of skin, unspecified: Secondary | ICD-10-CM

## 2023-09-07 NOTE — Progress Notes (Signed)
 Follow-Up Visit   Subjective  Alexander [name] is a 48 y.o. 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 Myrtie Atkinson.   He is s/p WLE for a DN moderate to severe on the right superior paraspinal region and states he feels it is healing well. He is also s/p WLE for DN moderate on his right paraspinal inferior, that he is healing well.   The following portions of the chart were reviewed this encounter and updated as appropriate: medications, allergies, medical history  Review of Systems:  No other skin or systemic complaints except as noted in HPI or Assessment and Plan.  Objective  Well appearing patient in no apparent distress; mood and affect are within normal limits.  A focused examination was performed of the following areas: Mid back Relevant physical exam findings are noted in the Assessment and Plan.   Mid Back Healing biopsy site   Assessment & Plan   DYSPLASTIC NEVUS Mid Back Skin excision - Mid Back  Excision method:  elliptical Lesion length (cm):  1.6 Lesion width (cm):  1 Margin per side (cm):  0.5 Total excision diameter (cm):  2.6 Informed consent: discussed and consent obtained   Timeout: patient name, date of birth, surgical site, and procedure verified   Procedure prep:  Patient was prepped and draped in usual sterile fashion Prep type:  Chlorhexidine  Anesthesia: the lesion was anesthetized in a standard fashion   Anesthetic:  1% lidocaine  w/ epinephrine 1-100,000 buffered w/ 8.4% NaHCO3 Instrument used: #15 blade   Hemostasis achieved with: suture, pressure and electrodesiccation   Hemostasis achieved with comment:  3.0 PDS with dermabond and sterir strips Outcome: patient tolerated procedure well with no complications   Post-procedure details: sterile dressing applied and wound care instructions given   Dressing type: bandage and pressure dressing   Additional details:  Final length 5.3  Skin  repair - Mid Back Complexity:  Complex Final length (cm):  5.3 Informed consent: discussed and consent obtained   Timeout: patient name, date of birth, surgical site, and procedure verified   Procedure prep:  Patient was prepped and draped in usual sterile fashion Prep type:  Chlorhexidine  Anesthesia: the lesion was anesthetized in a standard fashion   Anesthetic:  1% lidocaine  w/ epinephrine 1-100,000 buffered w/ 8.4% NaHCO3 Reason for type of repair: reduce tension to allow closure, allow closure of the large defect, preserve normal anatomy, avoid adjacent structures and allow side-to-side closure without requiring a flap or graft   Undermining: area extensively undermined   Subcutaneous layers (deep stitches):  Suture size:  3-0 Suture type: Vicryl (polyglactin 910) and PDS (polydioxanone)   Stitches:  Buried vertical mattress Fine/surface layer approximation (top stitches):  Suture type: cyanoacrylate tissue glue   Hemostasis achieved with: suture, pressure and electrodesiccation Outcome: patient tolerated procedure well with no complications   Post-procedure details: sterile dressing applied and wound care instructions given   Dressing type: bandage and pressure dressing   Specimen 1 - Surgical pathology Differential Diagnosis: DN [phone] Check Margins: No SCAR    The surgical wound was then cleaned, prepped, and re-anesthetized as above. Wound edges were undermined extensively along at least one entire edge and at a distance equal to or greater than the width of the defect (see wound defect size above) in order to achieve closure and decrease wound tension and anatomic distortion. Redundant tissue repair including standing cone removal was performed. Hemostasis was achieved with  electrocautery. Subcutaneous and epidermal tissues were approximated with the above sutures. The surgical site was then lightly scrubbed with sterile, saline-soaked gauze. Steri-strips were applied,  and the area was then bandaged using Vaseline ointment, non-adherent gauze, gauze pads, and tape to provide an adequate pressure dressing. The patient tolerated the procedure well, was given detailed written and verbal wound care instructions, and was discharged in good condition.   The patient will follow-up: PRN.  Scar s/p WLE for DN severe on right paraspinal inferior, treated on 08/24/2023, repaired with linear closure - Reassured that wound has healed well - Discussed that scars take up to 12 months to mature from the date of surgery - Recommend SPF 30+ to scar daily to prevent purple color - OK to start scar massage at 4-6 weeks post-op - Can consider silicone based products for scar healing  Scar s/p WLE for DN severe on right superior paraspinal, treated on 03/202/2025, repaired with linear closure - Reassured that wound has healed well - Discussed that scars take up to 12 months to mature from the date of surgery - Recommend SPF 30+ to scar daily to prevent purple color - OK to start scar massage at 4-6 weeks post-op - Can consider silicone based products for scar healing  History of Dysplastic Nevi - No evidence of recurrence today - Recommend regular full body skin exams - Recommend daily broad spectrum sunscreen SPF 30+ to sun-exposed areas, reapply every 2 hours as needed.  - Call if any new or changing lesions are noted between office visits  Return if symptoms worsen or fail to improve.   Documentation: I have reviewed the above documentation for accuracy and completeness, and I agree with the above.  Deneise Finlay, MD

## 2023-09-07 NOTE — Patient Instructions (Signed)

## 2023-09-08 LAB — SURGICAL PATHOLOGY

## 2023-09-18 ENCOUNTER — Encounter: Payer: BC Managed Care – PPO | Attending: Family Medicine | Admitting: Dietician

## 2023-09-18 ENCOUNTER — Encounter: Payer: Self-pay | Admitting: Dietician

## 2023-09-18 VITALS — Ht 73.0 in | Wt 321.9 lb

## 2023-09-18 DIAGNOSIS — E669 Obesity, unspecified: Secondary | ICD-10-CM | POA: Insufficient documentation

## 2023-09-18 NOTE — Progress Notes (Signed)
 Supervised Weight Loss Visit Bariatric Nutrition Education Appt Start Time: 2956    End Time: 0910  Planned surgery: Sleeve Gastrectomy Pt expectation of surgery: decrease medication; overall wellness  Referral stated Supervised Weight Loss (SWL) visits needed: 6 months  3 out of 6 SWL Appointments   NUTRITION ASSESSMENT   Anthropometrics  Start weight at [facility]: 329.2 lbs (date: 07/06/2023) Height: 73 in Weight today: 321.9 lbs BMI: 42.21 kg/m2    Clinical   Pharmacotherapy: History of weight loss medication used: He tried Wegovy  for weight loss, but discontinued due to severe side effects.  Just started Zepbound two weeks ago, stating yesterday was his 3rd shot.  Medical hx: hypercholesterolemia, HTN, sleep apnea, obesity Medications: rosuvastatin , isosorbide , zepbound, loperamide , potassium chloride , Xarelto , spironolactone , furosemide , Entresto , dapagliflozin  propanediol, doxazosin , nitroglycerin , multivitamin, acetaminophen , clopidogrel , carvedilol , tramadol , bupropion , allopurinol , colchicine Labs: most recent labs not in EMR from yesterday (01/30/2023: A1c 5.7; triglycerides 243; HDL 34; glucose 109 Notable signs/symptoms: none noted Any previous deficiencies? No  Lifestyle & Dietary Hx  Pt states he had several moles removed from the center of his back over the past month, stating he has not been able to do physical activity. Pt states he has been waiting for the next dosage of Zepbound from the pharmacy, stating he may go back down to the prior dosage. Pt states he ordered a couple of bariatric plates for portion control, stating he has been eating more non-starchy vegetables. Pt states his wife is allergic to cupriferous vegetables. Pt states he has purchased a freezer for meal prep. Pt states they have been freezing vegetables. Pt states he has been eating better, but his exercise dropped due to the surgery. Pt states he and his family has cut out soda, stating even the  diet drinks. Pt is worried about hydration after surgery, stating he will chug his water, and knows he will need to sip more between meals.  Estimated daily fluid intake: 96 oz Supplements: multivitamin Current average weekly physical activity: ADLs, Gym, 5 mornings per week, 45-60 minutes (cardio and weights)  24-Hr Dietary Recall First Meal: protein shake, Oikos yogurt, protein granola bar Snack: granola bar Second Meal: fish, rice, vegetable Snack: popcorn or trail mix Third Meal: grilled chicken or fish, broccoli or asparagus and rice or baked potato or lentils or pasta or chicken alfredo. Snack: ice cream or popcorn or chips Beverages: coffee, water, bourbon, cyrstal light, orange juice, coke zero  Alcoholic beverages per week: 3-4 (double the past few weeks)   Estimated Energy Needs Calories: 1600  NUTRITION DIAGNOSIS  Overweight/obesity ([place]-3.3) related to past poor dietary habits and physical inactivity as evidenced by patient w/ planned sleeve surgery following dietary guidelines for continued weight loss.  After bariatric surgery, it's crucial to prioritize protein intake at every meal and snack to support healing, maintain muscle mass, and promote weight loss. Practice aiming for 80 grams of protein per day, focusing on lean protein sources like eggs, poultry, fish, dairy.  NUTRITION INTERVENTION  Nutrition counseling (C-1) and education (E-2) to facilitate bariatric surgery goals.  Sipping fluids between meals and snacks is essential.  Why Sipping Between Meals and Snacks is Key: Small Stomach Capacity: After bariatric surgery, the stomach's capacity is significantly reduced. Trying to drink large amounts of fluid at once can lead to discomfort, nausea, and even vomiting. Sipping allows for a steady and manageable intake. Preventing Feeling Too Full During Meals: Drinking a lot right before or during a meal can make you feel full quickly,  preventing you from consuming  enough nutrient-dense foods, especially protein, which is a priority. Optimizing Nutrient Absorption: Separating fluid intake from meals can potentially improve nutrient absorption. Some believe that excessive fluids with meals can dilute stomach acid and digestive enzymes. Consistent Hydration: Sipping throughout the day ensures a more consistent level of hydration, preventing dehydration symptoms like fatigue, headaches, and constipation. Meeting High Fluid Goals: The recommended fluid intake for bariatric patients (often 64 ounces or more) can be challenging to achieve if you only drink with meals. Spacing it out makes it much more manageable. Distinguishing Thirst from Hunger: Sometimes, thirst can be mistaken for hunger. Sipping on fluids throughout the day can help you differentiate between the two and avoid unnecessary snacking. Preventing Dehydration-Related Complications: Adequate hydration is vital for kidney function, bowel regularity, and overall recovery after surgery. Consistent sipping helps minimize the risk of dehydration and its associated complications.  Practical Tips for Sipping Between Meals and Snacks: Carry a Water Bottle: Having a water bottle with you at all times serves as a visual reminder to sip regularly. Set Reminders: Use your phone or a timer to remind yourself to take a few sips every 15-30 minutes. Measure Your Intake: Keep track of how much you're drinking throughout the day to ensure you're on track to meet your goal. Many water bottles have measurement markings. Infuse Your Water: If plain water is boring, try infusing it with natural flavors like cucumber, lemon, lime, berries, or mint. Choose Other Acceptable Fluids: Unsweetened tea, sugar-free flavored water, and diluted sugar-free juices (as recommended by your team) can also contribute to your fluid intake. Avoid Sugary and Carbonated Drinks: These are generally discouraged after bariatric surgery. Be Mindful  Around Meal Times: Typically, you'll be advised to stop drinking about 30 minutes before a meal and wait about 30-60 minutes after eating before resuming fluids to optimize food intake and digestion. Always follow your bariatric team's specific guidelines on this. In conclusion, sipping hydrating fluids consistently between your small, frequent meals and snacks is a cornerstone of post-bariatric surgery care. It's a crucial strategy for meeting your hydration goals comfortably, preventing complications, and supporting your overall health and recovery. Make it a conscious and consistent habit!  Pre-Op Goals Progress & New Goals Continue: track protein; aim for 80 grams per day; aim for a protein with every meal and snack; aim for protein from food Continue: cut out grazing between meals or at night (Zepbound is helping with that, but still be mindful); schedule meals and snacks Continue: take 20-30 minutes to eat a meal Continue: eliminate distractions while eating; continue sitting at the table as a family Continue: using the bariatric MyPlate method, increase non-starchy vegetables (aim for 2 or more servings per day) New: practice not drinking with meals and snacks; practicing sipping and avoid chugging.  Handouts Provided Include    Learning Style & Readiness for Change Teaching method utilized: Visual & Auditory  Demonstrated degree of understanding via: Teach Back  Readiness Level: preparation Barriers to learning/adherence to lifestyle change: nothing identified   RD's Notes for next Visit  Pt progress toward chosen goals.  MONITORING & EVALUATION Dietary intake, weekly physical activity, body weight, and pre-op goals in 1 month.  Next Steps  Patient is to return to [facility] in 1 month for next [facility] visit.

## 2023-09-27 ENCOUNTER — Other Ambulatory Visit: Payer: Self-pay | Admitting: Physician Assistant

## 2023-09-27 ENCOUNTER — Other Ambulatory Visit (HOSPITAL_COMMUNITY): Payer: Self-pay | Admitting: Internal Medicine

## 2023-09-27 ENCOUNTER — Other Ambulatory Visit (HOSPITAL_COMMUNITY): Payer: Self-pay | Admitting: Cardiology

## 2023-09-27 ENCOUNTER — Other Ambulatory Visit (HOSPITAL_COMMUNITY): Payer: Self-pay | Admitting: Family Medicine

## 2023-10-03 ENCOUNTER — Encounter (HOSPITAL_COMMUNITY): Payer: Self-pay | Admitting: Internal Medicine

## 2023-10-03 NOTE — Telephone Encounter (Signed)
 Refill request sent to [facility] followed by Dr Tita Form

## 2023-10-05 ENCOUNTER — Telehealth: Payer: Self-pay | Admitting: Internal Medicine

## 2023-10-05 MED ORDER — DAPAGLIFLOZIN PROPANEDIOL 10 MG PO TABS: 10.0000 mg | ORAL_TABLET | Freq: Every day | ORAL | 0 refills | Status: DC

## 2023-10-05 MED ORDER — DOXAZOSIN MESYLATE 2 MG PO TABS: 2.0000 mg | ORAL_TABLET | Freq: Every day | ORAL | 0 refills | Status: DC

## 2023-10-05 NOTE — Telephone Encounter (Signed)
 Pt's medications were sent to pt's pharmacy as requested. Confirmation received.

## 2023-10-05 NOTE — Telephone Encounter (Signed)
*  STAT* If patient is at the pharmacy, call can be transferred to refill team.   1. Which medications need to be refilled? (please list name of each medication and dose if known)   dapagliflozin  propanediol (FARXIGA ) 10 MG TABS tablet     doxazosin  (CARDURA ) 2 MG tablet    2. Which pharmacy/location (including street and city if local pharmacy) is medication to be sent to?  [id number] - [place], [place] - [address] AT [address] [facility] [address]    3. Do they need a 30 day or 90 day supply? 90  Patient has appt 7/30

## 2023-10-20 ENCOUNTER — Encounter: Payer: BC Managed Care – PPO | Attending: General Surgery | Admitting: Dietician

## 2023-10-20 ENCOUNTER — Encounter: Payer: Self-pay | Admitting: Dietician

## 2023-10-20 ENCOUNTER — Other Ambulatory Visit (HOSPITAL_COMMUNITY): Payer: Self-pay | Admitting: Internal Medicine

## 2023-10-20 VITALS — Ht 73.0 in | Wt 320.8 lb

## 2023-10-20 DIAGNOSIS — E669 Obesity, unspecified: Secondary | ICD-10-CM | POA: Insufficient documentation

## 2023-10-20 NOTE — Progress Notes (Signed)
 Supervised Weight Loss Visit Bariatric Nutrition Education Appt Start Time: [phone]    End Time: 0853  Planned surgery: Sleeve Gastrectomy Pt expectation of surgery: decrease medication; overall wellness  Referral stated Supervised Weight Loss (SWL) visits needed: 6 months  4 out of 6 SWL Appointments   NUTRITION ASSESSMENT   Anthropometrics  Start weight at [facility]: 329.2 lbs (date: 07/06/2023) Height: 73 in Weight today: 320.8 lbs BMI: 42.32 kg/m2    Clinical   Pharmacotherapy: History of weight loss medication used: He tried Wegovy  for weight loss, but discontinued due to severe side effects.  Just started Zepbound two weeks ago, stating yesterday was his 3rd shot.  Medical hx: hypercholesterolemia, HTN, sleep apnea, obesity Medications: rosuvastatin , isosorbide , zepbound, loperamide , potassium chloride , Xarelto , spironolactone , furosemide , Entresto , dapagliflozin  propanediol, doxazosin , nitroglycerin , multivitamin, acetaminophen , clopidogrel , carvedilol , tramadol , bupropion , allopurinol , colchicine Labs: most recent labs not in EMR from yesterday (01/30/2023: A1c 5.7; triglycerides 243; HDL 34; glucose 109 Notable signs/symptoms: none noted Any previous deficiencies? No  Lifestyle & Dietary Hx  Pt states he was increased with Zepbound to 7.5 mg, last month, stating there has been some side affects, stating he needs to really needs to be conscious of eating, stating he does not want to eat, but feels light headed when he does not eat. Pt states he started taking a fiber supplement (psyllium husks capsules) for constipation. Pt states it has been difficult not drinking with meals, stating he is doing well sipping instead of chugging. Pt states he has not reduced alcohol intake lately, stating the only carbonation is beer. Pt states he will be able to stop drinking alcohol after surgery. Pt states he started back exercising last week, stating it has been difficult being sedentary while  his back was healing.  Estimated daily fluid intake: 96 oz Supplements: multivitamin Current average weekly physical activity: ADLs, Gym, 5 mornings per week, 45-60 minutes (cardio and weights)  24-Hr Dietary Recall First Meal: protein shake, Oikos yogurt, protein granola bar Snack: granola bar Second Meal: fish, rice, vegetable Snack: popcorn or trail mix Third Meal: grilled chicken or fish, broccoli or asparagus and rice or baked potato or lentils or pasta or chicken alfredo. Snack: ice cream or popcorn or chips Beverages: coffee, water, bourbon, cyrstal light, orange juice, coke zero  Alcoholic beverages per week: 3-4 (double the past few weeks)   Estimated Energy Needs Calories: 1600  NUTRITION DIAGNOSIS  Overweight/obesity ([place]-3.3) related to past poor dietary habits and physical inactivity as evidenced by patient w/ planned sleeve surgery following dietary guidelines for continued weight loss.  NUTRITION INTERVENTION  Nutrition counseling (C-1) and education (E-2) to facilitate bariatric surgery goals.  Tracking protein intake is crucial both before and after bariatric surgery. Pre-surgery, practice tracking helps to create the habit for after surgery. Post-surgery, the stomach's reduced capacity and altered digestion mean that protein becomes the most vital nutrient. Adequate protein intake is essential for proper wound healing, preventing muscle loss (which can otherwise account for a significant portion of weight lost), supporting metabolism, and maintaining overall health, including hair, skin, and nails. It also helps with satiety, feeling fuller for longer and reducing the urge to snack on less nutritious foods. Without sufficient protein, there is an increased risk malnutrition, muscle wasting, and slower recovery. Therefore, meticulous tracking ensures protein requirements despite consuming much smaller food portions.  Pre-Op Goals Progress & New Goals Re-engage: track  protein; aim for 80 grams per day; aim for a protein with every meal and snack; aim for  protein from food Continue: cut out grazing between meals or at night (Zepbound is helping with that, but still be mindful); schedule meals and snacks Continue: take 20-30 minutes to eat a meal Continue: eliminate distractions while eating; continue sitting at the table as a family Continue: using the bariatric MyPlate method, increase non-starchy vegetables (aim for 2 or more servings per day) Continue: practice not drinking with meals and snacks; practicing sipping and avoid chugging.  Handouts Provided Include    Learning Style & Readiness for Change Teaching method utilized: Visual & Auditory  Demonstrated degree of understanding via: Teach Back  Readiness Level: preparation Barriers to learning/adherence to lifestyle change: nothing identified   RD's Notes for next Visit  Patient progress toward chosen goals.  MONITORING & EVALUATION Dietary intake, weekly physical activity, body weight, and pre-op goals in 1 month.  Next Steps  Patient is to return to [facility] in 1 month for next SWL visit.

## 2023-10-29 ENCOUNTER — Other Ambulatory Visit (HOSPITAL_COMMUNITY): Payer: Self-pay | Admitting: Internal Medicine

## 2023-11-12 ENCOUNTER — Other Ambulatory Visit (HOSPITAL_COMMUNITY): Payer: Self-pay | Admitting: Internal Medicine

## 2023-11-14 ENCOUNTER — Encounter: Payer: Self-pay | Admitting: Dietician

## 2023-11-14 ENCOUNTER — Encounter: Attending: General Surgery | Admitting: Dietician

## 2023-11-14 VITALS — Ht 73.0 in | Wt 310.3 lb

## 2023-11-14 DIAGNOSIS — E669 Obesity, unspecified: Secondary | ICD-10-CM | POA: Diagnosis present

## 2023-11-14 NOTE — Progress Notes (Signed)
 Supervised Weight Loss Visit Bariatric Nutrition Education Appt Start Time: 9147    End Time: 0912  Planned surgery: Sleeve Gastrectomy Pt expectation of surgery: decrease medication; overall wellness  Referral stated Supervised Weight Loss (SWL) visits needed: 6 months  5 out of 6 SWL Appointments   NUTRITION ASSESSMENT   Anthropometrics  Start weight at [facility]: 329.2 lbs (date: 07/06/2023) Height: 73 in Weight today: 310.3 lbs BMI: 40.94 kg/m2    Clinical   Pharmacotherapy:  History of weight loss medication used: He tried Wegovy  for weight loss, but discontinued due to severe side effects.  Just started Zepbound two weeks ago, stating yesterday was his 3rd shot.  Medical hx: hypercholesterolemia, HTN, sleep apnea, obesity Medications: rosuvastatin , isosorbide , zepbound, loperamide , potassium chloride , Xarelto , spironolactone , furosemide , Entresto , dapagliflozin  propanediol, doxazosin , nitroglycerin , multivitamin, acetaminophen , clopidogrel , carvedilol , tramadol , bupropion , allopurinol , colchicine Labs: most recent labs not in EMR from yesterday (01/30/2023: A1c 5.7; triglycerides 243; HDL 34; glucose 109 Notable signs/symptoms: none noted Any previous deficiencies? No  Lifestyle & Dietary Hx  Pt states the 7.5 mg of Zepbound and does not have an appetite, has to intentionally eat, and pt states he is able to exercise again. Pt states he added in a fiber supplement, stating it is psyllium husk and eating more vegetables. Pt states he is getting 1151-130 grams of protein per day. Pt states they have been meal prepping more. Pt states he has cut down on drinking alcohol, stating he contributes some of that to his recent weight loss. Pt states he is going on a cruise next week, and will probably have more alcohol.  Estimated daily fluid intake: 96 oz Supplements: multivitamin Current average weekly physical activity: ADLs; golf 2 rounds per week, walking in the neighborhood daily,  for 15-20 minutes (1+ mile)  24-Hr Dietary Recall First Meal: protein shake, Oikos yogurt, protein granola bar Snack: granola bar Second Meal: fish, rice, vegetable Snack: popcorn or trail mix Third Meal: grilled chicken or fish, broccoli or asparagus and rice or baked potato or lentils or pasta or chicken alfredo. Snack: ice cream or popcorn or chips Beverages: coffee, water, bourbon, cyrstal light, orange juice, coke zero  Alcoholic beverages per week: 3-4 (double the past few weeks)   Estimated Energy Needs Calories: 1600  NUTRITION DIAGNOSIS  Overweight/obesity ([place]-3.3) related to past poor dietary habits and physical inactivity as evidenced by patient w/ planned sleeve surgery following dietary guidelines for continued weight loss.  NUTRITION INTERVENTION  Nutrition counseling (C-1) and education (E-2) to facilitate bariatric surgery goals.  Practicing bariatric eating patterns on vacation before surgery is a fantastic way to reinforce the lifelong habits needed for success. It involves strategic planning to prioritize lean protein and non-starchy vegetables, manage portion sizes, and avoid sugar-sweetened beverages and high-fat, high-sugar foods. This might mean researching restaurant menus in advance, packing protein-rich snacks, utilizing hotel refrigerators or kitchenettes, and remembering to separate liquids from solids. Essentially, it's about making conscious, healthy choices even when faced with travel temptations, demonstrating commitment and building confidence in your ability to maintain these essential lifestyle changes for the long term.  Pre-Op Goals Progress & New Goals Continue: track protein; aim for 80 grams per day; aim for a protein with every meal and snack; aim for protein from food Continue: cut out grazing between meals or at night (Zepbound is helping with that, but still be mindful); schedule meals and snacks Continue: take 20-30 minutes to eat a  meal Continue: eliminate distractions while eating; continue sitting at the  table as a family Continue: using the bariatric MyPlate method, increase non-starchy vegetables (aim for 2 or more servings per day) Re-engage: practice not drinking with meals and snacks; practicing sipping and avoid chugging. New: stay mindful during your cruise  Handouts Provided Include    Learning Style & Readiness for Change Teaching method utilized: Visual & Auditory  Demonstrated degree of understanding via: Teach Back  Readiness Level: preparation Barriers to learning/adherence to lifestyle change: nothing identified   RD's Notes for next Visit  Patient progress toward chosen goals.  MONITORING & EVALUATION Dietary intake, weekly physical activity, body weight, and pre-op goals in 1 month.  Next Steps  Patient is to return to [facility] in 1 month for next SWL visit.

## 2023-11-17 ENCOUNTER — Ambulatory Visit: Payer: BC Managed Care – PPO | Admitting: Dietician

## 2023-12-06 ENCOUNTER — Ambulatory Visit: Attending: Cardiology | Admitting: Internal Medicine

## 2023-12-06 ENCOUNTER — Encounter: Payer: Self-pay | Admitting: Internal Medicine

## 2023-12-06 VITALS — BP 110/86 | HR 80 | Ht 73.0 in | Wt 303.0 lb

## 2023-12-06 DIAGNOSIS — I5022 Chronic systolic (congestive) heart failure: Secondary | ICD-10-CM

## 2023-12-06 DIAGNOSIS — I480 Paroxysmal atrial fibrillation: Secondary | ICD-10-CM

## 2023-12-06 DIAGNOSIS — I10 Essential (primary) hypertension: Secondary | ICD-10-CM | POA: Diagnosis not present

## 2023-12-06 DIAGNOSIS — I25110 Atherosclerotic heart disease of native coronary artery with unstable angina pectoris: Secondary | ICD-10-CM

## 2023-12-06 DIAGNOSIS — E782 Mixed hyperlipidemia: Secondary | ICD-10-CM

## 2023-12-06 NOTE — Progress Notes (Signed)
 Cardiology Office Note:  .    Date:  12/06/2023  ID:  Alexander [name], DOB 1976/01/12, MRN [id number] PCP: Santo Domino, MD  [facility] Providers Cardiologist:  Toribio Fuel, MD Electrophysiologist:  OLE ONEIDA HOLTS, MD  Sleep Medicine:  Wilbert Bihari, MD     CC: Pre-operative visit  History of Present Illness: .    Alexander [name] is a 48 y.o. male with chronic systolic heart failure who presents for follow-up and medication management.  He has chronic systolic heart failure and is on goal-directed medical therapy. His blood pressure is well controlled, typically around 125/85 mmHg, and he feels as strong as he has in four years, despite minimal exercise. No recent episodes of atrial fibrillation have occurred.  He has obstructive coronary disease with rare chest ache and stable angina. A stent was placed over a year ago due to chest pain. Current medications include Coreg , digoxin , and furosemide , though he is not taking Lasix  regularly. He wishes to reduce his medication burden, especially after his upcoming bariatric surgery.  He has paroxysmal atrial fibrillation but is currently in sinus rhythm. He experiences occasional dizziness and lightheadedness when standing, which he attributes to his medications.  He is using Zepbound for weight management and has lost about 30 pounds since March. Bariatric surgery is tentatively scheduled for October. He reports occasional stomach issues but feels that his recent return to golfing has positively impacted his health.  His obstructive sleep apnea is well managed, and his gout is under the care of Dr. Mai. He has not seen Dr. Bettyjane since his last visit.  Discussed the use of AI scribe software for clinical note transcription with the patient, who gave verbal consent to proceed.   Relevant histories: .  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   Studies Reviewed: .     Cardiac Studies & Procedures    ______________________________________________________________________________________________ CARDIAC CATHETERIZATION  CARDIAC CATHETERIZATION 07/13/2022  Conclusion   Prox LAD to Mid LAD lesion is 80% stenosed.-Progression of disease (stenosis confirmed via OCT)   A drug-eluting stent was successfully placed using a SYNERGY XD 3.50X28. ->  Deployed to 3.7 mm.  Taper postdilation from 4.6-4.22-3.7 mm = full expansion. Post intervention, there is a 0% residual stenosis.   Mid RCA lesion is 45% stenosed.  RPDA lesion is 55% stenosed.   Previously placed Ost Cx to Prox Cx stent of unknown type is  widely patent.   The left ventricular systolic function is normal.   The left ventricular ejection fraction is 50-55% by visual estimate.   There is no aortic valve stenosis.  POST-OPERATIVE DIAGNOSIS: Culprit Lesion: Progression of mid LAD lesion just after diagonal branch at SP1 in progress from 70% to roughly 80% OCT guided PCI from major diagonal branch just beyond the next branch: Synergy XD 3.5 mm x 28 mm deployed to 3.7 mm and postdilated in tapered fashion from 4.6-4.2-3.7 mm Widely patent LCx stent Distal RCA 45% and ostial PDA 55 % stenosis. LV gram relatively poor filling but EF does appear to be low normal 50 to 55%.  (Defer to echo)   PLAN OF CARE:  Overnight monitoring, restart Xarelto  at p.m. dosing tonight; would do 1 month of aspirin , Plavix  and Xarelto  and then discontinue aspirin .  Continue Plavix  for 6 months total,then discontinue  Findings Coronary Findings Diagnostic  Dominance: Right  Left Anterior Descending Prox LAD to Mid LAD lesion is 80% stenosed. Vessel is the culprit lesion. The lesion  is distal to major branch, segmental, eccentric and irregular. Optical coherence tomography (OCT) was performed. Minimum lumen area: 1.6 mm.  First Septal Branch Vessel is small in size.  Second Diagonal Branch Vessel is small in size.  Left Circumflex Previously placed Ost  Cx to Prox Cx stent of unknown type is  widely patent. Previously placed stent displays no restenosis.  Right Coronary Artery Mid RCA lesion is 45% stenosed.  Right Posterior Descending Artery RPDA lesion is 55% stenosed.  Intervention  Prox LAD to Mid LAD lesion Stent Lesion length:  26 mm. CATH VISTA GUIDE 6FR XBLAD3.5 guide catheter was inserted. Lesion crossed with guidewire using a WIRE ASAHI PROWATER 180CM. Pre-stent angioplasty was performed using a BALLN EMERGE MR 3.0X20. Maximum pressure:  12 atm. Inflation time:  20 sec. 3 inflations A drug-eluting stent was successfully placed using a SYNERGY XD 3.50X28. Maximum pressure: 18 atm. Inflation time: 30 sec. Stent strut is well apposed. Confirmed by OCT after post dilation Post-stent angioplasty was performed using a BALLN [place] EUPHORA RX 4.5X8. Maximum pressure:  18 atm. Inflation time:  20 sec. The middle two thirds the stent was postdilated with BALLN [place] EMERGE MR 4.0X12 -16 ATM x 20 sec => 4.2 mm; proximal portion of stent POT -with 4.5 mm balloon Post-Intervention Lesion Assessment The intervention was successful. Pre-interventional TIMI flow is 3. Post-intervention TIMI flow is 3. Treated lesion length:  28 mm. No complications occurred at this lesion. There is a 0% residual stenosis post intervention.   CARDIAC CATHETERIZATION  CARDIAC CATHETERIZATION 08/17/2020  Conclusion  Prox Cx lesion is 80% stenosed.  Mid LAD lesion is 70% stenosed.  A drug-eluting stent was successfully placed using a STENT RESOLUTE ONYX 4.0X26.  Post intervention, there is a 0% residual stenosis.  1. Severe proximal Circumflex stenosis 2. Successful PTCA/DES x 1 proximal Circumflex 3. Pressure wire analysis of the mid LAD. RFR is 0.93 suggesting the mid stenosis is not flow limiting.  Recommendations: Will continue DAPT with ASA and Plavix  for at least one month. His ASA could be stopped in one month since he will also be on Xarelto . I would  continue the Plavix  for at least six months.  Findings Coronary Findings Diagnostic  Dominance: Right  Left Anterior Descending Mid LAD lesion is 70% stenosed. Pressure wire/FFR was performed on the lesion. FFR: 0.93. RFR=0.93  Left Circumflex Prox Cx lesion is 80% stenosed.  Intervention  Prox Cx lesion Stent CATH VISTA GUIDE 6FR XB3 guide catheter was inserted. Lesion crossed with guidewire using a WIRE COUGAR XT STRL 190CM. Pre-stent angioplasty was performed using a BALLOON SAPPHIRE 2.5X15. A drug-eluting stent was successfully placed using a STENT RESOLUTE ONYX 4.0X26. Stent strut is well apposed. Post-stent angioplasty was performed using a BALLOON [place] EMERGE MR 4.0X15. Post-Intervention Lesion Assessment The intervention was successful. Pre-interventional TIMI flow is 3. Post-intervention TIMI flow is 3. No complications occurred at this lesion. There is a 0% residual stenosis post intervention.     ECHOCARDIOGRAM  ECHOCARDIOGRAM COMPLETE 07/14/2022  Narrative ECHOCARDIOGRAM REPORT    Patient Name:   AMARIUS TOTO Date of Exam: 07/14/2022 Medical Rec #:  [id number]     Height:       73.0 in Accession #:    [id number]    Weight:       300.0 lb Date of Birth:  [birth year]     BSA:          2.556 m Patient Age:    46 years  BP:           109/75 mmHg Patient Gender: M             HR:           69 bpm. Exam Location:  Inpatient  Procedure: 2D Echo, Cardiac Doppler and Color Doppler  Indications:    Chest Pain R07.9  History:        Patient has prior history of Echocardiogram examinations, most recent 03/01/2021. CHF, Angina, Arrythmias:Atrial Fibrillation; Risk Factors:Hypertension and Dyslipidemia.  Sonographer:    Thea Norlander Referring Phys: [phone] LINDSAY B ROBERTS  IMPRESSIONS   1. Left ventricular ejection fraction, by estimation, is 60 to 65%. The left ventricle has normal function. The left ventricle has no regional wall motion abnormalities. The left  ventricular internal cavity size was mildly dilated. There is mild concentric left ventricular hypertrophy. Left ventricular diastolic parameters were normal. 2. Right ventricular systolic function is normal. The right ventricular size is normal. 3. Left atrial size was mildly dilated. 4. The mitral valve is normal in structure. No evidence of mitral valve regurgitation. No evidence of mitral stenosis. 5. The aortic valve is tricuspid. Aortic valve regurgitation is not visualized. No aortic stenosis is present. 6. The inferior vena cava is normal in size with greater than 50% respiratory variability, suggesting right atrial pressure of 3 mmHg.  FINDINGS Left Ventricle: Left ventricular ejection fraction, by estimation, is 60 to 65%. The left ventricle has normal function. The left ventricle has no regional wall motion abnormalities. The left ventricular internal cavity size was mildly dilated. There is mild concentric left ventricular hypertrophy. Left ventricular diastolic parameters were normal. Normal left ventricular filling pressure.  Right Ventricle: The right ventricular size is normal. No increase in right ventricular wall thickness. Right ventricular systolic function is normal.  Left Atrium: Left atrial size was mildly dilated.  Right Atrium: Right atrial size was normal in size.  Pericardium: There is no evidence of pericardial effusion.  Mitral Valve: The mitral valve is normal in structure. No evidence of mitral valve regurgitation. No evidence of mitral valve stenosis.  Tricuspid Valve: The tricuspid valve is normal in structure. Tricuspid valve regurgitation is not demonstrated. No evidence of tricuspid stenosis.  Aortic Valve: The aortic valve is tricuspid. Aortic valve regurgitation is not visualized. No aortic stenosis is present. Aortic valve mean gradient measures 6.0 mmHg. Aortic valve peak gradient measures 10.6 mmHg. Aortic valve area, by VTI measures  3.39 cm.  Pulmonic Valve: The pulmonic valve was normal in structure. Pulmonic valve regurgitation is not visualized. No evidence of pulmonic stenosis.  Aorta: The aortic root is normal in size and structure.  Venous: The inferior vena cava is normal in size with greater than 50% respiratory variability, suggesting right atrial pressure of 3 mmHg.  IAS/Shunts: No atrial level shunt detected by color flow Doppler.   LEFT VENTRICLE PLAX 2D LVIDd:         5.80 cm   Diastology LVIDs:         3.60 cm   LV e' medial:    7.83 cm/s LV PW:         1.10 cm   LV E/e' medial:  10.6 LV IVS:        1.30 cm   LV e' lateral:   11.50 cm/s LVOT diam:     2.30 cm   LV E/e' lateral: 7.2 LV SV:         111 LV SV Index:  43 LVOT Area:     4.15 cm   RIGHT VENTRICLE             IVC RV S prime:     19.40 cm/s  IVC diam: 1.30 cm TAPSE (M-mode): 2.8 cm  LEFT ATRIUM           Index        RIGHT ATRIUM           Index LA diam:      4.40 cm 1.72 cm/m   RA Area:     17.00 cm LA Vol (A2C): 67.0 ml 26.19 ml/m  RA Volume:   46.35 ml  18.13 ml/m LA Vol (A4C): 59.5 ml 23.28 ml/m AORTIC VALVE AV Area (Vmax):    3.27 cm AV Area (Vmean):   3.22 cm AV Area (VTI):     3.39 cm AV Vmax:           163.00 cm/s AV Vmean:          109.000 cm/s AV VTI:            0.327 m AV Peak Grad:      10.6 mmHg AV Mean Grad:      6.0 mmHg LVOT Vmax:         128.33 cm/s LVOT Vmean:        84.600 cm/s LVOT VTI:          0.267 m LVOT/AV VTI ratio: 0.82  AORTA Ao Asc diam: 3.80 cm  MITRAL VALVE MV Area (PHT): 3.85 cm    SHUNTS MV Decel Time: 197 msec    Systemic VTI:  0.27 m MV E velocity: 82.90 cm/s  Systemic Diam: 2.30 cm MV A velocity: 62.60 cm/s MV E/A ratio:  1.32  Mihai Croitoru MD Electronically signed by Jerel Balding MD Signature Date/Time: 07/14/2022/12:14:47 PM    Final          ______________________________________________________________________________________________        Physical Exam:    VS:  BP 110/86   Pulse 80   Ht 6' 1 (1.854 m)   Wt (!) 303 lb (137.4 kg)   SpO2 98%   BMI 39.98 kg/m    Wt Readings from Last 3 Encounters:  12/06/23 (!) 303 lb (137.4 kg)  11/14/23 (!) 310 lb 4.8 oz (140.8 kg)  10/20/23 (!) 320 lb 12.8 oz (145.5 kg)    Gen: no distress, Morbid obesity   Neck: No JVD Cardiac: No Rubs or Gallops, no Murmur, RRR +2 radial pulses Respiratory: Clear to auscultation bilaterally, normal effort, normal  respiratory rate GI: Soft, nontender, non-distended  MS: No  edema;  moves all extremities Integument: Skin feels warm Neuro:  At time of evaluation, alert and oriented to person/place/time/situation warm Psych: Normal affect, patient feels ok   ASSESSMENT AND PLAN: .    Chronic systolic heart failure NYHA I Chronic systolic heart failure is well-managed with goal-directed medical therapy. Blood pressure is controlled, and he is asymptomatic. Current medications include Entresto  and carvedilol ; SGLT2i and MRA - first medication I would stop if needed his Imdur ; his digoxin  has been formally stopped  Obstructive coronary artery disease with stable angina and history of myocardial infarction with prior stent placement Obstructive coronary artery disease is well-managed with rare chest ache. He has a history of myocardial infarction with prior stent placement. Isosorbide  mononitrate is planned to be discontinued as weight loss progresses post-bariatric surgery. - Discontinue isosorbide  mononitrate post-bariatric surgery if BP lowers - LDL at  goal with current therapy  Paroxysmal atrial fibrillation Paroxysmal atrial fibrillation is currently in sinus rhythm. There is a perioperative risk of atrial fibrillation, especially with upcoming bariatric surgery. Blood thinners should be minimized perioperatively to reduce AFib risk. Inflammation from thoracoabdominal surgery increases AFib risk, and digoxin  is not planned for long-term  use. - Minimize time off blood thinners perioperatively - Communicate AFib risk to bariatric surgeon  Morbid obesity Morbid obesity is being managed with GLP-1 therapy (Zepbound) and scheduled bariatric surgery. Significant weight loss has been achieved. Perioperative planning includes potential medication adjustments. The goal is to reduce medication burden post-surgery, focusing on those without mortality benefits. - Continue GLP-1 therapy (Zepbound) - Scheduling bariatric surgery for early October  He is at high risk for a cardiac patient going into non-cardiac surgery but he is very functional and asymptomatic.  With aggressive therapy his cardiac conditions are well managed.  Surgery is reasonable to proceed with.  Goal would be to minimize time of anticoagulation.  Obstructive sleep apnea - Obstructive sleep apnea is well controlled.  Hypertension Hypertension is well controlled with current medication regimen. Blood pressure typically remains around 125/85 mmHg.  November f/u with APP for post op check  Longitudinal care: The evaluation and management services provided today reflect the complexity inherent in caring for this patient, including the ongoing longitudinal relationship and management of multiple chronic conditions and/or the need for care coordination. The visit required a comprehensive assessment and management plan tailored to the patient's unique needs Time was spent addressing not only the acute concerns but also the broader context of the patient's health, including preventive care, chronic disease management, and care coordination as appropriate.  Complex longitudinal is necessary for conditions including: AF, CAD and HF (well managed) in young patient < age 4; with goals to get off some medication   Stanly Leavens, MD FASE [facility] Cardiologist [facility]  [address] [place], [address] [place], [place] [address] [phone]  10:36 AM

## 2023-12-06 NOTE — Patient Instructions (Signed)
 Medication Instructions:  Your physician has recommended you make the following change in your medication:  STOP: digoxin    *If you need a refill on your cardiac medications before your next appointment, please call your pharmacy*  Lab Work: NONE  If you have labs (blood work) drawn today and your tests are completely normal, you will receive your results only by: MyChart Message (if you have MyChart) OR A paper copy in the mail If you have any lab test that is abnormal or we need to change your treatment, we will call you to review the results.  Testing/Procedures: NONE  Follow-Up: At [facility], you and your health needs are our priority.  As part of our continuing mission to provide you with exceptional heart care, our providers are all part of one team.  This team includes your primary Cardiologist (physician) and Advanced Practice Providers or APPs (Physician Assistants and Nurse Practitioners) who all work together to provide you with the care you need, when you need it.  Your next appointment:   4 month(s)  Provider:   Jackee Alberts, NP

## 2023-12-07 ENCOUNTER — Other Ambulatory Visit: Payer: Self-pay

## 2023-12-07 MED ORDER — ISOSORBIDE MONONITRATE ER 30 MG PO TB24: 30.0000 mg | ORAL_TABLET | Freq: Every day | ORAL | 3 refills | Status: AC

## 2023-12-08 ENCOUNTER — Ambulatory Visit (HOSPITAL_COMMUNITY)
Admission: EM | Admit: 2023-12-08 | Discharge: 2023-12-08 | Disposition: A | Discharge status: Home / Self Care | Attending: Family Medicine | Admitting: Family Medicine

## 2023-12-08 ENCOUNTER — Encounter (HOSPITAL_COMMUNITY): Payer: Self-pay

## 2023-12-08 DIAGNOSIS — R252 Cramp and spasm: Secondary | ICD-10-CM | POA: Diagnosis present

## 2023-12-08 DIAGNOSIS — R109 Unspecified abdominal pain: Secondary | ICD-10-CM | POA: Diagnosis present

## 2023-12-08 LAB — POCT URINALYSIS DIP (MANUAL ENTRY)
Bilirubin, UA: NEGATIVE
Glucose, UA: >=1000 mg/dL — AB
Ketones, POC UA: NEGATIVE mg/dL
Leukocytes, UA: NEGATIVE
Nitrite, UA: NEGATIVE
Protein Ur, POC: NEGATIVE mg/dL
Spec Grav, UA: 1.02 (ref 1.010–1.025)
Urobilinogen, UA: 0.2 U/dL
pH, UA: 5.5 (ref 5.0–8.0)

## 2023-12-08 LAB — BASIC METABOLIC PANEL WITH GFR
Anion gap: 12 (ref 5–15)
BUN: 11 mg/dL (ref 6–20)
CO2: 24 mmol/L (ref 22–32)
Calcium: 9.5 mg/dL (ref 8.9–10.3)
Chloride: 104 mmol/L (ref 98–111)
Creatinine, Ser: 0.86 mg/dL (ref 0.61–1.24)
GFR, Estimated: >60 mL/min (ref >60)
Glucose, Bld: 60 mg/dL — ABNORMAL LOW (ref 70–99)
Potassium: 3.5 mmol/L (ref 3.5–5.1)
Sodium: 140 mmol/L (ref 135–145)

## 2023-12-08 MED ORDER — PREDNISONE 20 MG PO TABS: 40.0000 mg | ORAL_TABLET | Freq: Every day | ORAL | 0 refills | Status: AC

## 2023-12-08 MED ORDER — TIZANIDINE HCL 4 MG PO TABS: 4.0000 mg | ORAL_TABLET | Freq: Every evening | ORAL | 0 refills | Status: DC | PRN

## 2023-12-08 NOTE — Discharge Instructions (Addendum)
 The urinalysis had just a tiny amount of blood in it that most likely is not a cause for concern.  Urine culture is still sent and staff will notify you if that grows a bacteria showing you have a urinary tract infection.  There was also a lot of sugar in your urine, but that is from your medicine called Farxiga .  Take prednisone  20 mg--2 daily for 5 days   Take tizanidine 4 mg--1 at bedtime as needed for muscle spasms; this medication can cause dizziness and sleepiness  We have drawn blood to check your electrolytes and kidney function numbers.  Staff will notify you if anything significantly abnormal.  We do not have a callback nurse on the weekends, however.  If you see something in your MyChart results that you have a question about, please call this clinic.

## 2023-12-08 NOTE — ED Provider Notes (Signed)
 [facility]    CSN: [id number] Arrival date & time: 12/08/23  1123      History   Chief Complaint Chief Complaint  Patient presents with   Back Pain    HPI Alexander [name] is a 48 y.o. male.    Back Pain Here for upper lumbar and lower thoracic pain.  He began bothering him about 2 weeks ago.  He has mainly noted when he is awakening in the morning.  It will feel like a tightness and kind of come in waves.  As he moves around it will most of the time improved.  No fever or rash and no dysuria or hematuria.  He has also had some muscle cramping in the sides of his chest in the mid axillary line.  No cough or fever.  He has had occasional nausea, but he is on Zepbound.  No recent fall or trauma  He has been treated for arthritis in his lumbosacral area.  He is allergic to penicillins and cephalexin and albuterol  He does have a history of gout and atrial fibrillation and takes Xarelto .  No history of diabetes at this time.  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/12/2023   Severe atypia - right paraspinal superior - Ex needed Moderate - right paraspinal inferior - Ex needed Severe atypia - mid back - Ex needed     Gout    Hyperlipidemia    Hypertension    OSA (obstructive sleep apnea) 09/2020    Patient Active Problem List   Diagnosis Date Noted   Dysplastic nevi 07/12/2023   Paroxysmal atrial fibrillation (HCC) 09/19/2022   Unstable angina (HCC) 07/12/2022   Morbid obesity (HCC) 06/17/2021   Chronic systolic heart failure (HCC)    Coronary artery disease involving native coronary artery of native heart with unstable angina pectoris (HCC)    Acute systolic heart failure (HCC) 05/13/2020    Acute on chronic systolic (congestive) heart failure (HCC) 05/13/2020   Atrial fibrillation with RVR (HCC) 04/26/2020   HLD (hyperlipidemia) 04/26/2020   HTN (hypertension) 04/26/2020   Depression 04/26/2020   Gout 04/26/2020   Panic attacks 04/26/2020   Low back pain 04/26/2020    Past Surgical History:  Procedure Laterality Date   CARDIAC CATHETERIZATION     CARDIOVERSION N/A 05/15/2020   Procedure: CARDIOVERSION;  Surgeon: Cherrie Toribio SAUNDERS, MD;  Location: [facility] ENDOSCOPY;  Service: Cardiovascular;  Laterality: N/A;   CORONARY IMAGING/OCT N/A 07/13/2022   Procedure: INTRAVASCULAR IMAGING/OCT;  Surgeon: Anner Alm ORN, MD;  Location: [facility] INVASIVE CV LAB;  Service: Cardiovascular;  Laterality: N/A;   CORONARY STENT INTERVENTION N/A 08/17/2020   Procedure: CORONARY STENT INTERVENTION;  Surgeon: Verlin Lonni BIRCH, MD;  Location: [facility] INVASIVE CV LAB;  Service: Cardiovascular;  Laterality: N/A;   CORONARY STENT INTERVENTION N/A 07/13/2022   Procedure: CORONARY STENT INTERVENTION;  Surgeon: Anner Alm ORN, MD;  Location: [facility] INVASIVE CV LAB;  Service: Cardiovascular;  Laterality: N/A;   LEFT HEART CATH AND CORONARY ANGIOGRAPHY N/A 07/13/2022   Procedure: LEFT HEART CATH AND CORONARY ANGIOGRAPHY;  Surgeon: Anner Alm ORN, MD;  Location: [facility] INVASIVE CV LAB;  Service: Cardiovascular;  Laterality: N/A;   MOLE REMOVAL  07/27/2023   RIGHT/LEFT HEART CATH AND CORONARY ANGIOGRAPHY N/A 08/17/2020   Procedure: RIGHT/LEFT HEART CATH AND CORONARY ANGIOGRAPHY;  Surgeon: Cherrie Toribio SAUNDERS, MD;  Location: [facility] INVASIVE CV LAB;  Service: Cardiovascular;  Laterality: N/A;   SEPTOPLASTY         Home Medications    Prior to Admission medications   Medication Sig Start Date End Date Taking? Authorizing Provider  predniSONE  (DELTASONE ) 20 MG tablet Take 2 tablets (40 mg total) by mouth daily with breakfast for 5 days. 12/08/23 12/13/23 Yes Heer Justiss K, MD  tiZANidine (ZANAFLEX) 4 MG tablet  Take 1 tablet (4 mg total) by mouth at bedtime as needed for muscle spasms. 12/08/23  Yes Vonna Sharlet POUR, MD  acetaminophen  (TYLENOL ) 500 MG tablet Take 500 mg by mouth every 6 (six) hours as needed for moderate pain.    [provider]  allopurinol  (ZYLOPRIM ) 100 MG tablet Take 100 mg by mouth daily. 02/13/20   [provider]  buPROPion  (WELLBUTRIN  XL) 300 MG 24 hr tablet Take 300 mg by mouth daily. 02/13/20   [provider]  carvedilol  (COREG ) 6.25 MG tablet TAKE 1 TABLET BY MOUTH 2 TIMES DAILY WITH A MEAL. 11/20/20   Bensimhon, Toribio SAUNDERS, MD  colchicine 0.6 MG tablet Take 0.6 mg by mouth 2 (two) times daily as needed (gout). 02/28/20   [provider]  dapagliflozin  propanediol (FARXIGA ) 10 MG TABS tablet Take 1 tablet (10 mg total) by mouth daily. 10/05/23   Santo Stanly LABOR, MD  doxazosin  (CARDURA ) 2 MG tablet Take 1 tablet (2 mg total) by mouth at bedtime. 10/05/23   Chandrasekhar, Mahesh A, MD  furosemide  (LASIX ) 20 MG tablet TAKE 1 TABLET BY MOUTH AS NEEDED FOR FLUID OR EDEMA 11/17/22   Shlomo Wilbert SAUNDERS, MD  isosorbide  mononitrate (IMDUR ) 30 MG 24 hr tablet Take 1 tablet (30 mg total) by mouth daily. 12/07/23   Santo Stanly LABOR, MD  loperamide  (IMODIUM ) 2 MG capsule Take 1 capsule (2 mg total) by mouth 4 (four) times daily as needed for diarrhea or loose stools. 04/12/23   Mannie Fairy DASEN, DO  Multiple Vitamin (MULTIVITAMIN WITH MINERALS) TABS tablet Take 1 tablet by mouth daily.    [provider]  nitroGLYCERIN  (NITROSTAT ) 0.4 MG SL tablet Place 1 tablet (0.4 mg total) under the tongue every 5 (five) minutes as needed for chest pain. 07/14/22 12/06/23  Meng, Hao, PA  potassium chloride  SA (KLOR-CON  M) 20 MEQ tablet Take 1 tablet (20 mEq total) by mouth as needed (with Lasix ). 03/15/23   Chandrasekhar, Mahesh A, MD  rosuvastatin  (CRESTOR ) 40 MG tablet TAKE 1 TABLET BY MOUTH EVERY DAY 10/20/23   Chandrasekhar, Mahesh A, MD  sacubitril -valsartan   (ENTRESTO ) 97-103 MG Take 1 tablet by mouth 2 (two) times daily. 11/14/23   Chandrasekhar, Stanly A, MD  spironolactone  (ALDACTONE ) 25 MG tablet TAKE 1 TABLET BY MOUTH EVERY DAY 11/01/23   Chandrasekhar, Mahesh A, MD  traMADol  (ULTRAM ) 50 MG tablet Take 50 mg by mouth 3 (three) times daily as needed for moderate pain. 02/13/20   [provider]  XARELTO  20 MG TABS tablet TAKE 1 TABLET BY MOUTH EVERY DAY 07/20/23   Turner, Wilbert SAUNDERS, MD  ZEPBOUND 7.5 MG/0.5ML Pen Inject 7.5 mg into the skin once a week.    [provider]    Family History Family History  Problem Relation Age of Onset   COPD Mother     Social History Social History   Tobacco  Use   Smoking status: Former    Types: Cigars    Quit date: 08/14/2020    Years since quitting: 3.3   Smokeless tobacco: Never  Substance Use Topics   Alcohol use: Yes    Alcohol/week: 1.0 standard drink of alcohol    Types: 1 Cans of beer per week    Comment: drinks beer socially 1 per week   Drug use: Not Currently     Allergies   Albuterol, Bee venom, Metaproterenol, Other, Amoxicillin-pot clavulanate, Cephalexin, and Amoxicillin   Review of Systems Review of Systems  Musculoskeletal:  Positive for back pain.     Physical Exam Triage Vital Signs ED Triage Vitals  Encounter Vitals Group     BP 12/08/23 1150 (!) 144/98     Girls Systolic BP Percentile --      Girls Diastolic BP Percentile --      Boys Systolic BP Percentile --      Boys Diastolic BP Percentile --      Pulse Rate 12/08/23 1150 60     Resp 12/08/23 1150 16     Temp 12/08/23 1150 98.4 F (36.9 C)     Temp Source 12/08/23 1150 Oral     SpO2 12/08/23 1150 97 %     Weight --      Height --      Head Circumference --      Peak Flow --      Pain Score 12/08/23 1152 3     Pain Loc --      Pain Education --      Exclude from Growth Chart --    No data found.  Updated Vital Signs BP (!) 144/98 (BP Location: Left Arm)   Pulse 60   Temp 98.4 F  (36.9 C) (Oral)   Resp 16   SpO2 97%   Visual Acuity Right Eye Distance:   Left Eye Distance:   Bilateral Distance:    Right Eye Near:   Left Eye Near:    Bilateral Near:     Physical Exam Vitals reviewed.  Constitutional:      General: He is not in acute distress.    Appearance: He is not ill-appearing, toxic-appearing or diaphoretic.  HENT:     Mouth/Throat:     Mouth: Mucous membranes are moist.  Cardiovascular:     Rate and Rhythm: Normal rate and regular rhythm.     Heart sounds: No murmur heard. Pulmonary:     Effort: Pulmonary effort is normal.     Breath sounds: Normal breath sounds.  Musculoskeletal:     Cervical back: Neck supple.     Comments: No tenderness at this time and the area has been so painful.  No rash  Lymphadenopathy:     Cervical: No cervical adenopathy.  Skin:    Coloration: Skin is not jaundiced or pale.     Comments: There are 2 well-healed scars that are linear in his mid back.  Patient states these are from some skin lesions that were removed.    Neurological:     General: No focal deficit present.     Mental Status: He is alert and oriented to person, place, and time.  Psychiatric:        Behavior: Behavior normal.      UC Treatments / Results  Labs (all labs ordered are listed, but only abnormal results are displayed) Labs Reviewed  POCT URINALYSIS DIP (MANUAL ENTRY) - Abnormal; Notable for the following components:  Result Value   Glucose, UA >=1,000 (*)    Blood, UA trace-lysed (*)    All other components within normal limits  URINE CULTURE  BASIC METABOLIC PANEL WITH GFR    EKG   Radiology No results found.  Procedures Procedures (including critical care time)  Medications Ordered in UC Medications - No data to display  Initial Impression / Assessment and Plan / UC Course  I have reviewed the triage vital signs and the nursing notes.  Pertinent labs & imaging results that were available during my care of  the patient were reviewed by me and considered in my medical decision making (see chart for details).     Urinalysis shows a trace of RBCs but no leukocytes or nitrites. There is 1000 mg percent of sugar; he takes farxiga   Prednisone  is sent in and tizanidine is sent in his muscle relaxer.  Urine culture is sent to address his concerns  Since some of this sounds like actual muscle cramping, BMP is drawn and staff will notify him if anything is significantly abnormal. Final Clinical Impressions(s) / UC Diagnoses   Final diagnoses:  Flank pain  Muscle cramps     Discharge Instructions      The urinalysis had just a tiny amount of blood in it that most likely is not a cause for concern.  Urine culture is still sent and staff will notify you if that grows a bacteria showing you have a urinary tract infection.  There was also a lot of sugar in your urine, but that is from your medicine called Farxiga .  Take prednisone  20 mg--2 daily for 5 days   Take tizanidine 4 mg--1 at bedtime as needed for muscle spasms; this medication can cause dizziness and sleepiness  We have drawn blood to check your electrolytes and kidney function numbers.  Staff will notify you if anything significantly abnormal.  We do not have a callback nurse on the weekends, however.  If you see something in your MyChart results that you have a question about, please call this clinic.     ED Prescriptions     Medication Sig Dispense Auth. Provider   predniSONE  (DELTASONE ) 20 MG tablet Take 2 tablets (40 mg total) by mouth daily with breakfast for 5 days. 10 tablet Jazyah Butsch K, MD   tiZANidine (ZANAFLEX) 4 MG tablet Take 1 tablet (4 mg total) by mouth at bedtime as needed for muscle spasms. 10 tablet Vonna Tailey Top K, MD      PDMP not reviewed this encounter.   Vonna Sharlet POUR, MD 12/08/23 1257

## 2023-12-08 NOTE — ED Triage Notes (Signed)
 Patient here today with c/o low back pain X 2 weeks. He has taken Tylenol  with some relief. Pain is worse in the morning upon waking. Heat helps. Patient states that he has h/o back pain for 15 years but concerned with possible infection.

## 2023-12-09 LAB — URINE CULTURE: Culture: NO GROWTH

## 2023-12-11 ENCOUNTER — Ambulatory Visit (HOSPITAL_COMMUNITY): Payer: Self-pay

## 2023-12-12 ENCOUNTER — Other Ambulatory Visit (HOSPITAL_COMMUNITY): Payer: Self-pay | Admitting: Internal Medicine

## 2023-12-18 ENCOUNTER — Encounter: Payer: BC Managed Care – PPO | Attending: General Surgery | Admitting: Dietician

## 2023-12-18 ENCOUNTER — Encounter: Payer: Self-pay | Admitting: Dietician

## 2023-12-18 VITALS — Ht 73.0 in | Wt 313.4 lb

## 2023-12-18 DIAGNOSIS — E669 Obesity, unspecified: Secondary | ICD-10-CM | POA: Insufficient documentation

## 2023-12-18 NOTE — Progress Notes (Signed)
 Supervised Weight Loss Visit Bariatric Nutrition Education Appt Start Time: 0839    End Time: 0902  Planned surgery: Sleeve Gastrectomy Pt expectation of surgery: decrease medication; overall wellness  Referral stated Supervised Weight Loss (SWL) visits needed: 6 months  6 out of 6 SWL Appointments   Pt completed visits.   Pt has cleared nutrition requirements.   NUTRITION ASSESSMENT   Anthropometrics  Start weight at [facility]: 329.2 lbs (date: 07/06/2023) Height: 73 in Weight today: 313.4 lbs BMI: 41.35 kg/m2    Clinical   Pharmacotherapy:  History of weight loss medication used: He tried Wegovy  for weight loss, but discontinued due to severe side effects.  Just started Zepbound two weeks ago, stating yesterday was his 3rd shot.  Medical hx: hypercholesterolemia, HTN, sleep apnea, obesity Medications: rosuvastatin , isosorbide , zepbound, loperamide , potassium chloride , Xarelto , spironolactone , furosemide , Entresto , dapagliflozin  propanediol, doxazosin , nitroglycerin , multivitamin, acetaminophen , clopidogrel , carvedilol , tramadol , bupropion , allopurinol , colchicine Labs: most recent labs not in EMR from yesterday (01/30/2023: A1c 5.7; triglycerides 243; HDL 34; glucose 109 Notable signs/symptoms: none noted Any previous deficiencies? No  Lifestyle & Dietary Hx  Pt states he hurt his back a couple of weeks ago. Pt states he just returned from a cruise. Pt states he had some drinks, stating he doesn't drink at home much anymore. Pt states he is ready to not have alcohol after surgery. Pt states he has worked on not drinking with meals, stating that has been hard. Pt states he has been working on sipping more when he drinks. Pt states he is way more aware of what he eats and drinks, stating his portion sizes and schedules his meals and snacks. Pt states he needs to focus on physical activity prior to surgery. Pt states he still needs to work on practicing not drinking while  eating. Pt states he would like to work on more variety, stating he has had the same breakfast for the past   Estimated daily fluid intake: 96 oz Supplements: multivitamin Current average weekly physical activity: ADLs; golf 2 rounds per week, walking in the neighborhood daily, for 15-20 minutes (1+ mile)  24-Hr Dietary Recall First Meal: protein shake, [employer]ik[employer] yogurt, protein granola bar Snack: granola bar Second Meal: fish, rice, vegetable Snack: popcorn or trail mix Third Meal: grilled chicken or fish, broccoli or asparagus and rice or baked potato or lentils or pasta or chicken alfredo. Snack: ice cream or popcorn or chips Beverages: coffee, water, bourbon, cyrstal light, orange juice, coke zero   Estimated Energy Needs Calories: 1600  NUTRITION DIAGNOSIS  Overweight/obesity ([place]-3.3) related to past poor dietary habits and physical inactivity as evidenced by patient w/ planned sleeve surgery following dietary guidelines for continued weight loss.  NUTRITION INTERVENTION  Nutrition counseling (C-1) and education (E-2) to facilitate bariatric surgery goals.  Reviewed the pre-op diet and post-op diet progressions.  Getting a variety of foods before and after bariatric surgery is crucial for ensuring balanced nutrition, supporting healing, and promoting long-term success. Before surgery, diverse food choices help prepare the body with essential nutrients like vitamins, minerals, and fiber, which can improve surgical outcomes and recovery. After surgery, the body's ability to absorb nutrients is reduced, making it even more important to include a wide range of nutrient-dense foods--such as lean proteins, vegetables, fruits, and whole grains--to prevent deficiencies and maintain energy levels. A varied diet also helps patients develop sustainable eating habits and avoid boredom, which can reduce the risk of reverting to unhealthy patterns.  Pre-Op Goals Progress & New Goals Continue:  track  protein; aim for 80 grams per day; aim for a protein with every meal and snack; aim for protein from food Continue: cut out grazing between meals or at night (Zepbound is helping with that, but still be mindful); schedule meals and snacks Continue: take 20-30 minutes to eat a meal Continue: eliminate distractions while eating; continue sitting at the table as a family Continue: using the bariatric MyPlate method, increase non-starchy vegetables (aim for 2 or more servings per day) Continue: practice not drinking with meals and snacks; practicing sipping and avoid chugging.  Handouts Provided Include    Learning Style & Readiness for Change Teaching method utilized: Visual & Auditory  Demonstrated degree of understanding via: Teach Back  Readiness Level: preparation Barriers to learning/adherence to lifestyle change: nothing identified   RD's Notes for next Visit  Patient progress toward chosen goals.  MONITORING & EVALUATION Dietary intake, weekly physical activity, body weight, and pre-op goals in 1 month.  Next Steps  Pt has completed visits. No further supervised visits required/recommended. Patient is to return to [facility] for pre-op class >2 weeks prior to scheduled surgery.

## 2023-12-19 ENCOUNTER — Ambulatory Visit: Admitting: Orthopedic Surgery

## 2023-12-25 ENCOUNTER — Ambulatory Visit (HOSPITAL_COMMUNITY): Admitting: Licensed Clinical Social Worker

## 2023-12-25 ENCOUNTER — Ambulatory Visit: Admitting: Physical Medicine and Rehabilitation

## 2023-12-25 ENCOUNTER — Encounter: Payer: Self-pay | Admitting: Physical Medicine and Rehabilitation

## 2023-12-25 DIAGNOSIS — F4323 Adjustment disorder with mixed anxiety and depressed mood: Secondary | ICD-10-CM | POA: Diagnosis not present

## 2023-12-25 DIAGNOSIS — M47817 Spondylosis without myelopathy or radiculopathy, lumbosacral region: Secondary | ICD-10-CM

## 2023-12-25 DIAGNOSIS — S39012A Strain of muscle, fascia and tendon of lower back, initial encounter: Secondary | ICD-10-CM | POA: Diagnosis not present

## 2023-12-25 NOTE — Progress Notes (Signed)
 Core Outcome Measures Index (COMI) Back Score  Average Pain 0  COMI Score 0 %

## 2023-12-25 NOTE — Progress Notes (Signed)
 [place] - 48 y.o. male MRN [id number]  Date of birth: June 30, 1975  Office Visit Note: Visit Date: 12/25/2023 PCP: Santo Domino, MD Referred by: Santo Domino, MD  Subjective: Chief Complaint  Patient presents with   Middle Back - Pain   HPI: Alexander [name] is a 48 y.o. male who comes in today as a self referral for evaluation of acute on chronic bilateral lower back pain radiating around to right hip region. Pain localized to upper lumbar region and radiates down to lower back. Pain started several weeks ago after [place] in airport. Pain worsens with prolonged sitting. He describes pain as sore, aching and tight sensation, currently denies pain at this time. He was evaluated at Urgent Care on 12/08/2023, prescribed oral prednisone  and tizanidine , good relief of pain with these medications. Lumbar MRI imaging from 2020 shows mild lumbar degenerative disc disease with mild neural foraminal narrowing greatest on the left at L3-L4 and L4-L5. No high grade spinal canal stenosis noted. Patient denies focal weakness, numbness and tingling. No recent trauma or falls. 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      Review of Systems  Musculoskeletal:  Negative for back pain and myalgias.  Neurological:  Negative for tingling, sensory change, focal weakness and weakness.  All other systems reviewed and are negative.  Otherwise per HPI.  Assessment & Plan: Visit Diagnoses:    ICD-10-CM   1. Acute myofascial strain of lumbar region, initial encounter  S39.012A     2. Spondylosis without myelopathy or radiculopathy, lumbosacral region  M47.817        Plan: Findings:  Acute on chronic bilateral lower back pain radiating around to right hip region. Overall, he is feeling much better at this time. His pain has gradually improved over the last several weeks. He continues with conservative therapies such as home exercise regimen, rest and use of medications. Patients clinical  presentation and exam are consistent with acute myofascial strain. I explained to him that myofascial strain injuries can take 6-8 weeks to heal. We discussed treatment plan moving forward. I placed order for short course of formal physical therapy with a focus on manual treatments, core strengthening and possible dry needling. He would like to return to [facility], I did provide him with a paper order to take to facility. He is planning to have bariatric surgery in the upcoming months, can always hold PT and resume post surgery. I did provide patent with Dr. Glean McGill's Big Three exercises to start at home. We are happy to see him back as needed. Should his symptoms present as more radicular in nature we would consider obtaining new lumbar MRI imaging. No red flag symptoms noted upon exam today.     Meds & Orders: No orders of the defined types were placed in this encounter.  No orders of the defined types were placed in this encounter.   Follow-up: Return if symptoms worsen or fail to improve.   Procedures: No procedures performed      Clinical History: MRI lumbar spine:  TECHNIQUE: Sagittal and axial T1 and T2-weighted sequences were performed. Additional sagittal STIR images were performed.  INDICATION: Back pain  COMPARISON: None  FINDINGS: #  Lumbar alignment is preserved. #  Vertebral body heights are well maintained. #  The marrow signal intensity is normal. #  Conus terminates at L1 without evidence of tethering. #  Nerve roots appear normal. #  Incidental findings: None.   #  L1-2: Normal. #   #  L2-3: Disc desiccation. Central canal is well-maintained. Neural foramina patent #   #  L3-4: Mild degenerative disc disease. Disc osteophyte causes mild left neural foraminal narrowing. No significant central canal stenosis. #   #  L4-5: Desiccation. Central canal is well-maintained. Disc osteophyte causes mild bilateral neural foraminal narrowing. No central central  canal stenosis. #   #  L5-S1: Normal.   IMPRESSION:  Mild lumbar degenerative disc disease with mild neural foraminal narrowing greatest on the left at L3-4 and L4-5. No significant central canal stenosis.  Electronically Signed by: Dallas Jubilee Exam End: 04/12/19 14:22   He reports that he quit smoking about 3 years ago. His smoking use included cigars. He has never used smokeless tobacco. No results for input(s): HGBA1C, LABURIC in the last 8760 hours.  Objective:  VS:  HT:    WT:   BMI:     BP:   HR: bpm  TEMP: ( )  RESP:  Physical Exam Vitals and nursing note reviewed.  HENT:     Head: Normocephalic and atraumatic.     Right Ear: External ear normal.     Left Ear: External ear normal.     Nose: Nose normal.     Mouth/Throat:     Mouth: Mucous membranes are moist.  Eyes:     Extraocular Movements: Extraocular movements intact.  Cardiovascular:     Rate and Rhythm: Normal rate.     Pulses: Normal pulses.  Pulmonary:     Effort: Pulmonary effort is normal.  Abdominal:     General: Abdomen is flat. There is no distension.  Musculoskeletal:        General: No tenderness.     Cervical back: Normal range of motion.     Comments: Patient rises from seated position to standing without difficulty. Good lumbar range of motion. No pain noted with facet loading. 5/5 strength noted with bilateral hip flexion, knee flexion/extension, ankle dorsiflexion/plantarflexion and EHL. No clonus noted bilaterally. No pain upon palpation of greater trochanters. No pain with internal/external rotation of bilateral hips. Sensation intact bilaterally. Negative slump test bilaterally. Ambulates without aid, gait steady.     Skin:    General: Skin is warm and dry.     Capillary Refill: Capillary refill takes less than 2 seconds.  Neurological:     General: No focal deficit present.     Mental Status: He is alert and oriented to person, place, and time.  Psychiatric:        Mood and  Affect: Mood normal.        Behavior: Behavior normal.     Ortho Exam  Imaging: No results found.  Past Medical/Family/Surgical/Social History: Medications & Allergies reviewed per EMR, new medications updated. Patient Active Problem List   Diagnosis Date Noted   Dysplastic nevi 07/12/2023   Paroxysmal atrial fibrillation (HCC) 09/19/2022   Unstable angina (HCC) 07/12/2022   Morbid obesity (HCC) 06/17/2021   Chronic systolic heart failure (HCC)    Coronary artery disease involving native coronary artery of native heart with unstable angina pectoris (HCC)    Acute systolic heart failure (HCC) 05/13/2020   Acute on chronic systolic (congestive) heart failure (HCC) 05/13/2020   Atrial fibrillation with RVR (HCC) 04/26/2020   HLD (hyperlipidemia) 04/26/2020   HTN (hypertension) 04/26/2020   Depression 04/26/2020   Gout 04/26/2020   Panic attacks 04/26/2020   Low back pain 04/26/2020   Past Medical History:  Diagnosis Date   Arrhythmia    Atrial fibrillation (  HCC)    CAD (coronary artery disease) 08/17/2020   PCI + DES to pLCx, 70% mLAD negative FFR testing (medical therapy)    CHF (congestive heart failure) (HCC)    Chronic systolic heart failure (HCC)    Dysplastic nevi 07/12/2023   Severe atypia - right paraspinal superior - Ex needed Moderate - right paraspinal inferior - Ex needed Severe atypia - mid back - Ex needed     Gout    Hyperlipidemia    Hypertension    OSA (obstructive sleep apnea) 09/2020   Family History  Problem Relation Age of Onset   COPD Mother    Past Surgical History:  Procedure Laterality Date   CARDIAC CATHETERIZATION     CARDIOVERSION N/A 05/15/2020   Procedure: CARDIOVERSION;  Surgeon: Cherrie Toribio SAUNDERS, MD;  Location: [facility] ENDOSCOPY;  Service: Cardiovascular;  Laterality: N/A;   CORONARY IMAGING/OCT N/A 07/13/2022   Procedure: INTRAVASCULAR IMAGING/OCT;  Surgeon: Anner Alm ORN, MD;  Location: [facility] INVASIVE CV LAB;  Service:  Cardiovascular;  Laterality: N/A;   CORONARY STENT INTERVENTION N/A 08/17/2020   Procedure: CORONARY STENT INTERVENTION;  Surgeon: Verlin Lonni BIRCH, MD;  Location: [facility] INVASIVE CV LAB;  Service: Cardiovascular;  Laterality: N/A;   CORONARY STENT INTERVENTION N/A 07/13/2022   Procedure: CORONARY STENT INTERVENTION;  Surgeon: Anner Alm ORN, MD;  Location[facility] INVASIVE CV LAB;  Service: Cardiovascular;  Laterality: N/A;   LEFT HEART CATH AND CORONARY ANGIOGRAPHY N/A 07/13/2022   Procedure: LEFT HEART CATH AND CORONARY ANGIOGRAPHY;  Surgeon: Anner Alm ORN, MD;  Location: [facility] INVASIVE CV LAB;  Service: Cardiovascular;  Laterality: N/A;   MOLE REMOVAL  07/27/2023   RIGHT/LEFT HEART CATH AND CORONARY ANGIOGRAPHY N/A 08/17/2020   Procedure: RIGHT/LEFT HEART CATH AND CORONARY ANGIOGRAPHY;  Surgeon: Cherrie Toribio SAUNDERS, MD;  Location: [facility] INVASIVE CV LAB;  Service: Cardiovascular;  Laterality: N/A;   SEPTOPLASTY     Social History   Occupational History   Not on file  Tobacco Use   Smoking status: Former    Types: Cigars    Quit date: 08/14/2020    Years since quitting: 3.3   Smokeless tobacco: Never  Substance and Sexual Activity   Alcohol use: Yes    Alcohol/week: 1.0 standard drink of alcohol    Types: 1 Cans of beer per week    Comment: drinks beer socially 1 per week   Drug use: Not Currently   Sexual activity: Not on file

## 2023-12-25 NOTE — Patient Instructions (Signed)
 Dr. Lu Duffel McGill  "The Big Three" that will safely increase your endurance and protect your back: modified curl-up, side bridge, and bird dog.  1. Modified Curl-Up Lie your back with one knee bent and one knee straight, this puts your pelvis in a neutral position and the muscles of your core in an optimal alignment of pull to avoid strain on the low back. Place your hands under the arch of your low back and ensure that this arch is maintained throughout the curl-up. Start by bracing your abdomen; this is different from flexing your abs, bear down through your belly. Now make sure you can take a breath in and a breath out while maintaining this brace. If you cannot, stop there and practice doing just that until you've got it mastered! Now, pretend that your spine in your neck and your upper back are cemented together and do not move independently. Pick a spot on the ceiling and focus your gaze there, lift your shoulder blades about 30 off the floor and slowly return to the start position. Take note of your neck, and ensure that your chin isn't poking forward when you do a curl up. If you're struggling with that, focus on making a double chin. Perform 3 sets of 10-12.  2. Side Bridge Lie on your side and prop yourself up on your elbow. Ensure that your elbow is directly under your shoulder to avoid any unnecessary strain through your shoulder joint. With your legs straight, place your top foot on the ground in front of your bottom foot. Place your top hand on your bottom shoulder. While maintaining the natural curve of your spine, that is to say, be sure that your upper body isn't twisted or leaning forward, brace your abdomen, squeeze through your gluteals (clench your bum), and lift your hips up off the ground. Don't forget to breathe! Hold for 8-10 seconds, repeat 3 times. As the exercise becomes easier, increase the number of repetitions as opposed to the length of time. There are a number of ways to  modify this exercise in order to increase or decrease the difficulty such as the example below on the right. If it's not challenging enough, try putting that top hand on your top hip, or straight up in the air, but again, be sure your body stays straight!  3. Bird Dog Start on your hands and knees with your hands shoulder width apart directly under your shoulders, and knees hip width apart directly under your hips. Maintain a neutral spine. Brace through your abdomen and squeeze your gluteals. Ensure you can maintain this while you take a breath in and out. Lift your right arm in front until it's level with your shoulder, squeezing the muscles between your shoulder blades as you do so. At the same time, extend your left leg straight back until it is level with your hips, squeezing your gluteals, and keeping your hips square to the floor. Return to the starting position in a slow and controlled manner, and perform the same action with the left arm and right leg. That is one repetition. Perform 3 sets of 8-10 repetitions. As this exercise becomes easy, focus on co-contracting the muscles of your forearm and arms while you extend, the same goes for the muscles of your legs. For an additional challenge, instead of putting your hand and knee back down on the ground between reps, try just sweeping the floor and performing the next rep right away, or draw a square with your  arm and leg and then sweep the floor.  *Avoid exercises that forward flex the spine or extend the spine.

## 2023-12-25 NOTE — Addendum Note (Signed)
 Addended by: Blaire Palomino R on: 12/25/2023 06:42 PM   Modules accepted: Level of Service

## 2023-12-25 NOTE — Progress Notes (Signed)
 Virtual Visit via Video Note  I connected with Alexander [name] on 12/25/23 at  5:00 PM EDT by a video enabled telemedicine application and verified that I am speaking with the correct person using two identifiers.  Location: Patient: virtual, home, [place] Provider: virtual, home office, [place]   I discussed the limitations of evaluation and management by telemedicine and the availability of in person appointments. The patient expressed understanding and agreed to proceed.   I discussed the assessment and treatment plan with the patient. The patient was provided an opportunity to ask questions and all were answered. The patient agreed with the plan and demonstrated an understanding of the instructions.   The patient was advised to call back or seek an in-person evaluation if the symptoms worsen or if the condition fails to improve as anticipated.   Comprehensive Clinical Assessment (CCA) Note  12/25/2023 Alexander [name] [id number]  Chief Complaint:  Chief Complaint  Patient presents with   BARIATRIC SCREENING   Visit Diagnosis:  Encounter Diagnosis  Name Primary?   Adjustment disorder with mixed anxiety and depressed mood Yes   Disposition:  Clinician sees no significant psychological factors that would hinder the success of bariatric surgery at time of assessment. Clinician supports patient candidacy for Bariatric Surgery.   Patient reports realistic expectations post surgery, is aware of the pre and post surgical process, client reports that behavioral health diagnosis(es) are stable at time of assessment, client reports positive pre and post surgical support system, and client reports motivation to make positive change.     CCA Biopsychosocial Intake/Chief Complaint:  BARIATRIC SCREENING  Current Symptoms/Problems: Alexander [name] is a 48 yo male reporting to [facility] for screening prior to weight loss surgery. Alexander [name] reports that he went through the process to get weight  loss surgery 3 years ago and was diagnosed with A Fib and had to put the surgery on hold until his symptoms stabilized. Alexander [name] reports that he has had the diagnoses of anxiety and depression, currently treated with bupropion .  Patient reports that his primary care physician manages his medications.  Patient denies any current suicidal ideation, homicidal ideation, or perceptual disturbances. Pt reports that he has gone through 6 months of supervised weight loss prior to scheduling surgery and has recently lost 30 lbs. Pt also taking Zepbound currently. Pt is able to articulate the behavior changes and eating changes that he will be making prior to and after weight loss surgery.   Patient Reported Schizophrenia/Schizoaffective Diagnosis in Past: No   Strengths: Patient feels that he has a lot of knowledge that could be beneficial in supporting him through the weight loss journey.  Preferences: Patient reports that he enjoys spending time with family, playing golf, and riding stationary bike.  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  Patient reports that he has been taking the bupropion  for 15 years.   Mental Health Symptoms Depression:  Fatigue; Weight gain/loss (energy has improved since last session)   Duration of Depressive symptoms: Greater than two weeks   Mania:  None   Anxiety:   Worrying (worrying about upcoming surgery--feels lower  levels of baseline anxiety)   Psychosis:  None   Duration of Psychotic symptoms: No data recorded  Trauma:  None   Obsessions:  None    Compulsions:  None   Inattention:  None   Hyperactivity/Impulsivity:  None   Oppositional/Defiant Behaviors:  None   Emotional Irregularity:  None   Other Mood/Personality Symptoms:  Patient denies any additional mood related concerns    Mental Status Exam Appearance and self-care  Stature:  Average   Weight:  Obese   Clothing:  Neat/clean   Grooming:  Normal   Cosmetic use:  None   Posture/gait:  Normal   Motor activity:  Not Remarkable   Sensorium  Attention:  Normal   Concentration:  Normal   Orientation:  X5   Recall/memory:  Normal   Affect and Mood  Affect:  Appropriate   Mood:  Other (Comment) (within normal limits)   Relating  Eye contact:  Normal   Facial expression:  Responsive   Attitude toward examiner:  Cooperative   Thought and Language  Speech flow: Clear and Coherent   Thought content:  Appropriate to Mood and Circumstances   Preoccupation:  None   Hallucinations:  None   Organization:  No data recorded  [employer] of Knowledge:  Good   Intelligence:  Above Average   Abstraction:  Normal   Judgement:  Good   Reality Testing:  Realistic   Insight:  Good   Decision Making:  Normal   Social Functioning  Social Maturity:  Responsible   Social Judgement:  Normal   Stress  Stressors:  Family conflict; Grief/losses; Illness (four teens in the house; still grieving loss of mother; worried about own health--worried about MIL health (cancer))   Coping Ability:  Normal   Skill Deficits:  None   Supports:  Family; Friends/Service system     Religion: Religion/Spirituality Are You A Religious Person?: No How Might This Affect Treatment?: No religious barriers to treatment  Leisure/Recreation: Leisure / Recreation Do You Have Hobbies?: Yes Leisure and Hobbies: Patient reports that he enjoys playing golf and sports. Pt very happy that he is able to play golf again--this is his favorite recreational  egypt  Exercise/Diet: Exercise/Diet Do You Exercise?: Yes What Type of Exercise Do You Do?: Bike, Run/Walk (golfing 2 x per week) How Many Times a Week Do You Exercise?: 4-5 times a week Have You Gained or Lost A Significant Amount of Weight in the Past Six Months?: Yes-Gained Number of Pounds Gained: 30 Do You Follow a Special Diet?: Yes Type of Diet: working on smaller portions, tracking portions, drinking water--trying sipping on hot days. no carbonated beverages Do You Have Any Trouble Sleeping?: Yes Explanation of Sleeping Difficulties: some pain with back--back strain   CCA Employment/Education Employment/Work Situation: Employment / Work Situation Employment Situation: Employed Where is Patient Currently Employed?: Patient reports that he currently manages a Product manager company.  Patient reports that he works remotely at home How Long has Patient Been Employed?: Patient reports he has been employed with this company for 22 years Are You Satisfied With Your Job?: Yes Do You Work More Than One Job?: No Work Stressors: Patient denies any current work stressors, and reports that he truly enjoys every aspect of his work Patient's Job has Been Impacted by Current Illness: No What is the Longest Time Patient has Held a Job?: 22 years Where was the Patient Employed at that Time?: Current job Has Patient ever Been in Equities trader?:  No  Education: Education Is Patient Currently Attending School?: No Last Grade Completed: 12 Name of High School: Patient reports he attended a college prep school in [place]  Did You Graduate From [employer]?: Yes Did You Attend College?: Yes What Type of College Degree Do you Have?: Bachelors degree from [employer] Did [place] Attend Graduate School?: No What Was Your Major?: Physics Did You Have Any Special Interests In School?: None Did You Have An Individualized Education Program (IIEP): No Did You Have Any Difficulty  At School?: No Patient's Education Has Been Impacted by Current Illness: No   CCA Family/Childhood History Family and Relationship History: Family history Marital status: Married What types of issues is patient dealing with in the relationship?: Patient reports that he is not having any marital stresses at this time Additional relationship information: Patient reports that his wife is a very good support system for him Are you sexually active?: Yes What is your sexual orientation?: Heterosexual Has your sexual activity been affected by drugs, alcohol, medication, or emotional stress?: Patient denies Does patient have children?: Yes How many children?: 2 How is patient's relationship with their children?: Patient reports that he has 2 biological children and 2 stepchildren--patient reports that he has a good relationship with all of them  Childhood History:  Childhood History By whom was/is the patient raised?: Mother/father and step-parent Additional childhood history information: Patient reports that he lived with his mother, and stepfather in [place] .  Patient reports that he considered his stepfather as his father.  Patient reports that he had a younger brother that he is still in contact with.  Patient reports very stable childhood Description of patient's relationship with caregiver when they were a child: stable Patient's description of current relationship with people who raised him/her: mother--deceased How were you disciplined when you got in trouble as a child/adolescent?: Patient reports that he was disciplined fairly as a child with no physical abuse Does patient have siblings?: Yes Number of Siblings: 1 Description of patient's current relationship with siblings: Patient reports positive current relationship with brother Did patient suffer any verbal/emotional/physical/sexual abuse as a child?: No Did patient suffer from severe childhood neglect?: No Has patient ever been  sexually abused/assaulted/raped as an adolescent or adult?: No Was the patient ever a victim of a crime or a disaster?: No Witnessed domestic violence?: No Has patient been affected by domestic violence as an adult?: No  Child/Adolescent Assessment:     CCA Substance Use Alcohol/Drug Use: Alcohol / Drug Use Pain Medications: SEE MAR Prescriptions: SEE MAR Over the Counter: SEE MAR History of alcohol / drug use?: No history of alcohol / drug abuse Longest period of sobriety (when/how long): Off and on/patient reports he does not drink much. Pt reports rare social etoh use. Pt reports he is willing to give up alcohol 100% if bariatric team suggests. Negative Consequences of Use:  (none) Withdrawal Symptoms: None     ASAM's:  Six Dimensions of Multidimensional Assessment  Dimension 1:  Acute Intoxication and/or Withdrawal Potential:   Dimension 1:  Description of individual's past and current experiences of substance use and withdrawal: Patient reports that he socially drinks  Dimension 2:  Biomedical Conditions and Complications:   Dimension 2:  Description of patient's biomedical conditions and  complications: Patient reports a history of anxiety, arthritis, depression, hypertension, high cholesterol, allergies, atrial fibrillation  Dimension 3:  Emotional, Behavioral, or Cognitive Conditions and Complications:  Dimension 3:  Description of emotional, behavioral, or cognitive conditions  and complications: Patient reports a stabilized history of anxiety and depression treatment  Dimension 4:  Readiness to Change:  Dimension 4:  Description of Readiness to Change criteria: Patient reports that he is ready to make positive changes to improve overall wellness  Dimension 5:  Relapse, Continued use, or Continued Problem Potential:  Dimension 5:  Relapse, continued use, or continued problem potential critiera description: Patient reports good support system  Dimension 6:  Recovery/Living  Environment:  Dimension 6:  Recovery/Iiving environment criteria description: Patient reports good support system  ASAM Severity Score: ASAM's Severity Rating Score: 0  ASAM Recommended Level of Treatment: ASAM Recommended Level of Treatment: Level I Outpatient Treatment   Substance use Disorder (SUD) Substance Use Disorder (SUD)  Checklist Symptoms of Substance Use:  (none)  Recommendations for Services/Supports/Treatments: Recommendations for Services/Supports/Treatments Recommendations For Services/Supports/Treatments: Individual Therapy (psychotherapy PRN)  DSM5 Diagnoses: Patient Active Problem List   Diagnosis Date Noted   Dysplastic nevi 07/12/2023   Paroxysmal atrial fibrillation (HCC) 09/19/2022   Unstable angina (HCC) 07/12/2022   Morbid obesity (HCC) 06/17/2021   Chronic systolic heart failure (HCC)    Coronary artery disease involving native coronary artery of native heart with unstable angina pectoris (HCC)    Acute systolic heart failure (HCC) 05/13/2020   Acute on chronic systolic (congestive) heart failure (HCC) 05/13/2020   Atrial fibrillation with RVR (HCC) 04/26/2020   HLD (hyperlipidemia) 04/26/2020   HTN (hypertension) 04/26/2020   Depression 04/26/2020   Gout 04/26/2020   Panic attacks 04/26/2020   Low back pain 04/26/2020    Patient Centered Plan: Patient is on the following Treatment Plan(s):    Behavioral Health Assessment  Patient Name Alexander [name] Date of Birth:  Nov 24, 1975 Age:  48 y.o. Date of Interview:  12/25/23 Gender:  M   Date of Report : 12/25/23 Purpose:   Bariatric/Weight-loss Surgery (pre-operative evaluation)    Assessment Instruments:  DSM-5-TR Self-Rated Level 1 Cross-Cutting Symptom Measure--Adult Severity Measure for Generalized Anxiety Disorder--Adult EAT-26 (Eating Attitudes Test) SSS-8 (Somatic Symptom Scale)  Chief Complaint: BARIATRIC SCREENING  Client Background: Patient is a 48 year old male seeking weight loss  surgery. Patient has a bachelor's degree from 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   The patient is 6 feet 1 inches tall and 325 lbs., reflecting a BMI of 42.9 classifying patient in the obese range and at further risk of co-morbid diseases.   Tobacco Use: Patient denies tobacco use.   PATIENT BEHAVIORAL ASSESSMENT SCORES  Personal History of Mental Illness: Patient reports that depression and anxiety successfully treated with buproprion.   Mental Status Examination: Patient was oriented x5 (person, place, situation, time, and object). Patient was appropriately groomed, and neatly dressed. Patient was alert, engaged, pleasant, and cooperative. Patient denies suicidal and homicidal ideations or any perceptual disturbances. Patient denies self-injury.   DSM-5-TR Self-Rated Level 1 Cross-Cutting Symptom Measure--Adult: Patient completed 23-item questionnaire assessing symptoms related to depression, anger, mania, anxiety, somatic symptoms, suicidal ideation, psychosis, insomnia, memory concerns, repetitive behaviors, dissociation, personality functioning and substance use. Alexander [name] scored 6, which is reflective of good symptom management.   Severity Measure for Generalized Anxiety Disorder--Adult: Patient completed a 10-item  scale. Total scores can range from 0 to 40. A raw score is calculated by summing the answer to each question, and an average total score is achieved by dividing the raw score by  the number of items (e.g., 10).Alexander [name] had a total raw score of 3 out of 40 which was divided by the total number of questions answered (10) to get an average score of .3 which indicates no significant anxiety.   EAT-26: The EAT-26 is a twenty-six-question screening tool to identify symptoms of dieting behaviors, bulimia, food preoccupation and oral control.  Alexander [name] scored  3 out of 26. Scores below a 20 are considered not meeting criteria for disordered eating. Patient denies inducing vomiting, or intentional meal skipping. Patient denies binge eating behaviors. Patient denies laxative abuse. Patient does not meet criteria for a DSM-V eating disorder.  SSS-8: The SSS-8, or Somatic Symptom Scale-8, is a brief self-report questionnaire used to assess the perceived burden of common somatic (physical) symptoms.  (SSS-8) is scored by summing the responses to eight items, each rated on a 5-point Likert scale from 0 (Not at all) to 4 (Very much). Total scores range from 0 to 32, with higher scores indicating greater somatic symptom burden. Scores are categorized into five severity levels: no/minimal, low, medium, high, and very high somatic symptom burden. Alexander [name] scored 7 out of 32, which indicates low score.   Conclusion & Recommendations:   Health history and current assessment reflect that patient is suitable to be a candidate for bariatric surgery. Patient understands the procedure, the risks associated with it, and the importance of post-operative holistic care (Physical, Spiritual/Values, Relationships, and Mental/Emotional health) with access to resources for support as needed. The patient has made an informed decision to proceed with procedure. The patient is motivated and expressed understanding of the post-surgical requirements. Patient's psychological assessment will be valid from today's date for 6 months (06/26/2024). After that date, a follow-up appointment will be needed to re-evaluate the patient's psychological status.   Clinician sees no significant psychological factors that would hinder the success of bariatric surgery at time of assessment. Clinician supports patient candidacy for Bariatric Surgery.   Alexander [name], MSW, LCSW Licensed Clinical Social [facility]     Referrals to Alternative Service(s): Referred  to Alternative Service(s):   Place:   Date:   Time:    Referred to Alternative Service(s):   Place:   Date:   Time:    Referred to Alternative Service(s):   Place:   Date:   Time:    Referred to Alternative Service(s):   Place:   Date:   Time:      Collaboration of Care: Other Pt recommended to continue ongoing care with bariatric team members.   Patient/Guardian was advised Release of Information must be obtained prior to any record release in order to collaborate their care with an outside provider. Patient/Guardian was advised if they have not already done so to contact the registration department to sign all necessary forms in order for us  to release information regarding their care.   Consent: Patient/Guardian gives verbal consent for treatment and assignment of benefits for services provided during this visit. Patient/Guardian expressed understanding and agreed to proceed.   Dacotah Cabello R Rashaan Wyles, LCSW

## 2023-12-25 NOTE — Progress Notes (Signed)
 Pain Scale   Average Pain 2 Patient advising he has middle back pain radiating to right hip area at times.        +Driver, -BT, -Dye Allergies.

## 2023-12-31 ENCOUNTER — Other Ambulatory Visit: Payer: Self-pay | Admitting: Internal Medicine

## 2024-01-08 ENCOUNTER — Encounter: Payer: Self-pay | Admitting: Internal Medicine

## 2024-01-16 ENCOUNTER — Ambulatory Visit: Payer: Self-pay | Admitting: General Surgery

## 2024-01-22 ENCOUNTER — Encounter: Attending: General Surgery | Admitting: Skilled Nursing Facility1

## 2024-01-22 NOTE — Progress Notes (Signed)
 Pre-Operative Nutrition Class:    Patient was seen on 01/22/2024 for Pre-Operative Bariatric Surgery Education at [facility].    Surgery date: 02/05/2024 Surgery type: Sleeve Start weight at [facility]: 329.2 Weight today: 321  Samples given per MNT protocol. Patient educated on appropriate usage:  Celebrate drink Lot [id number] Exp: 12/09/25  The following the learning objectives were met by the patient during this course: Identify Pre-Op Dietary Goals and will begin 2 weeks pre-operatively Identify appropriate sources of fluids and proteins  State protein recommendations and appropriate sources pre and post-operatively Identify Post-Operative Dietary Goals and will follow for 2 weeks post-operatively Identify appropriate multivitamin and calcium  sources Describe the need for physical activity post-operatively and will follow MD recommendations State when to call healthcare provider regarding medication questions or post-operative complications When having a diagnosis of diabetes understanding hypoglycemia symptoms and the inclusion of 1 complex carbohydrate per meal  Handouts given during class include: Pre-Op Bariatric Surgery Diet Handout Protein Shake Handout Post-Op Bariatric Surgery Nutrition Handout BELT Program Information Flyer Support Group Information Flyer [facility] Bariatric Supplements Price List  Follow-Up Plan: Patient will follow-up at [facility] 2 weeks post operatively for diet advancement per MD.

## 2024-01-30 NOTE — Patient Instructions (Signed)
 SURGICAL WAITING ROOM VISITATION  Patients having surgery or a procedure may have no more than 2 support people in the waiting area - these visitors may rotate.    Children under the age of 11 must have an adult with them who is not the patient.  Visitors with respiratory illnesses are discouraged from visiting and should remain at home.  If the patient needs to stay at the hospital during part of their recovery, the visitor guidelines for inpatient rooms apply. Pre-op nurse will coordinate an appropriate time for 1 support person to accompany patient in pre-op.  This support person may not rotate.    Please refer to the [facility] website for the visitor guidelines for Inpatients (after your surgery is over and you are in a regular room).       Your procedure is scheduled on:  02/05/24    Report to [facility] Main Entrance    Report to admitting at  0515 AM   Call this number if you have problems the morning of surgery [phone]   Do not eat food :After Midnight.   After Midnight you may have the following liquids until _ 0430_____ AM DAY OF SURGERY  Water Non-Citrus Juices (without pulp, NO RED-Apple, White grape, White cranberry) Black Coffee (NO MILK/CREAM OR CREAMERS, sugar ok)  Clear Tea (NO MILK/CREAM OR CREAMERS, sugar ok) regular and decaf                             Plain Jell-O (NO RED)                                           Fruit ices (not with fruit pulp, NO RED)                                     Popsicles (NO RED)                                                               Sports drinks like Gatorade (NO RED)                The day of surgery:  Drink ONE (1) Pre-Surgery Clear Ensure or G2 at  0430AM the morning of surgery. Drink in one sitting. Do not sip.  This drink was given to you during your hospital  pre-op appointment visit. Nothing else to drink after completing the  Pre-Surgery Clear Ensure or G2.          If you have questions,  please contact your surgeon's office.       Oral Hygiene is also important to reduce your risk of infection.                                    Remember - BRUSH YOUR TEETH THE MORNING OF SURGERY WITH YOUR REGULAR TOOTHPASTE  DENTURES WILL BE REMOVED PRIOR TO SURGERY PLEASE DO NOT APPLY Poly grip OR ADHESIVES!!!   Do NOT  smoke after Midnight   Stop all vitamins and herbal supplements 7 days before surgery.   Take these medicines the morning of surgery with A SIP OF WATER:  allopurinol , wellbutrin , coreg , imdur , zyrtec                Farxiga - hold for 72 hours prior to procedure.  Last dose on 02/01/24.   DO NOT TAKE ANY ORAL DIABETIC MEDICATIONS DAY OF YOUR SURGERY  Bring CPAP mask and tubing day of surgery.                              You may not have any metal on your body including hair pins, jewelry, and body piercing             Do not wear make-up, lotions, powders, perfumes/cologne, or deodorant  Do not wear nail polish including gel and S&S, artificial/acrylic nails, or any other type of covering on natural nails including finger and toenails. If you have artificial nails, gel coating, etc. that needs to be removed by a nail salon please have this removed prior to surgery or surgery may need to be canceled/ delayed if the surgeon/ anesthesia feels like they are unable to be safely monitored.   Do not shave  48 hours prior to surgery.               Men may shave face and neck.   Do not bring valuables to the hospital. [facility] IS NOT             RESPONSIBLE   FOR VALUABLES.   Contacts, glasses, dentures or bridgework may not be worn into surgery.   Bring small overnight bag day of surgery.   DO NOT BRING YOUR HOME MEDICATIONS TO THE HOSPITAL. PHARMACY WILL DISPENSE MEDICATIONS LISTED ON YOUR MEDICATION LIST TO YOU DURING YOUR ADMISSION IN THE HOSPITAL!    Patients discharged on the day of surgery will not be allowed to drive home.  Someone NEEDS to stay with you  for the first 24 hours after anesthesia.   Special Instructions: Bring a copy of your healthcare power of attorney and living will documents the day of surgery if you haven't scanned them before.              Please read over the following fact sheets you were given: IF YOU HAVE QUESTIONS ABOUT YOUR PRE-OP INSTRUCTIONS PLEASE CALL [phone].   If you received a COVID test during your pre-op visit  it is requested that you wear a mask when out in public, stay away from anyone that may not be feeling well and notify your surgeon if you develop symptoms. If you test positive for Covid or have been in contact with anyone that has tested positive in the last 10 days please notify you surgeon.    [facility] - Preparing for Surgery Before surgery, you can play an important role.  Because skin is not sterile, your skin needs to be as free of germs as possible.  You can reduce the number of germs on your skin by washing with CHG (chlorahexidine gluconate) soap before surgery.  CHG is an antiseptic cleaner which kills germs and bonds with the skin to continue killing germs even after washing. Please DO NOT use if you have an allergy to CHG or antibacterial soaps.  If your skin becomes reddened/irritated stop using the CHG and inform your nurse when you arrive  at Short Stay. Do not shave (including legs and underarms) for at least 48 hours prior to the first CHG shower.  You may shave your face/neck. Please follow these instructions carefully:  1.  Shower with CHG Soap the night before surgery and the  morning of Surgery.  2.  If you choose to wash your hair, wash your hair first as usual with your  normal  shampoo.  3.  After you shampoo, rinse your hair and body thoroughly to remove the  shampoo.                           4.  Use CHG as you would any other liquid soap.  You can apply chg directly  to the skin and wash                       Gently with a scrungie or clean washcloth.  5.  Apply the CHG Soap  to your body ONLY FROM THE NECK DOWN.   Do not use on face/ open                           Wound or open sores. Avoid contact with eyes, ears mouth and genitals (private parts).                       Wash face,  Genitals (private parts) with your normal soap.             6.  Wash thoroughly, paying special attention to the area where your surgery  will be performed.  7.  Thoroughly rinse your body with warm water from the neck down.  8.  DO NOT shower/wash with your normal soap after using and rinsing off  the CHG Soap.                9.  Pat yourself dry with a clean towel.            10.  Wear clean pajamas.            11.  Place clean sheets on your bed the night of your first shower and do not  sleep with pets. Day of Surgery : Do not apply any lotions/deodorants the morning of surgery.  Please wear clean clothes to the hospital/surgery center.  FAILURE TO FOLLOW THESE INSTRUCTIONS MAY RESULT IN THE CANCELLATION OF YOUR SURGERY PATIENT SIGNATURE_________________________________  NURSE SIGNATURE__________________________________  ________________________________________________________________________

## 2024-01-30 NOTE — Progress Notes (Signed)
 Anesthesia Review:  PCP: Lauraine Buster teleophone visit 07/11/23  Cardiologist : Santo LVO 12/06/23   PPM/ ICD: Device Orders: Rep Notified:  Chest x-ray : 04/12/23- 1 view  EKG : 07/04/23  Stent- 2022  Echo : 07/14/22  Stress test: 07/13/22  Cardiac Cath :   Activity level: can do a flight of stairs without difficulty  Sleep Study/ CPAP : has cpap  Fasting Blood Sugar :      / Checks Blood Sugar -- times a day:   Prediabetes per pt     Farxiga - Hold for 72 hours prior to procedure- last dose on 02/01/24   Blood Thinner/ Instructions /Last Dose: ASA / Instructions/ Last Dose :    Xarelto  - last dose on 02/02/24  Bari Bed REquested on 02/02/24.

## 2024-01-31 ENCOUNTER — Encounter (HOSPITAL_COMMUNITY)
Admission: RE | Admit: 2024-01-31 | Discharge: 2024-01-31 | Disposition: A | Discharge status: Home / Self Care | Source: Ambulatory Visit | Attending: General Surgery | Admitting: General Surgery

## 2024-01-31 ENCOUNTER — Other Ambulatory Visit: Payer: Self-pay

## 2024-01-31 ENCOUNTER — Encounter (HOSPITAL_COMMUNITY): Payer: Self-pay

## 2024-01-31 VITALS — BP 133/96 | HR 69 | Temp 98.4°F | Resp 16 | Ht 72.0 in | Wt 310.0 lb

## 2024-01-31 DIAGNOSIS — Z6841 Body Mass Index (BMI) 40.0 and over, adult: Secondary | ICD-10-CM | POA: Insufficient documentation

## 2024-01-31 DIAGNOSIS — Z01812 Encounter for preprocedural laboratory examination: Secondary | ICD-10-CM | POA: Insufficient documentation

## 2024-01-31 DIAGNOSIS — Z01818 Encounter for other preprocedural examination: Secondary | ICD-10-CM

## 2024-01-31 DIAGNOSIS — I110 Hypertensive heart disease with heart failure: Secondary | ICD-10-CM | POA: Diagnosis not present

## 2024-01-31 DIAGNOSIS — F419 Anxiety disorder, unspecified: Secondary | ICD-10-CM | POA: Diagnosis not present

## 2024-01-31 DIAGNOSIS — Z955 Presence of coronary angioplasty implant and graft: Secondary | ICD-10-CM | POA: Insufficient documentation

## 2024-01-31 DIAGNOSIS — I4891 Unspecified atrial fibrillation: Secondary | ICD-10-CM | POA: Diagnosis not present

## 2024-01-31 DIAGNOSIS — F32A Depression, unspecified: Secondary | ICD-10-CM | POA: Insufficient documentation

## 2024-01-31 DIAGNOSIS — Z7901 Long term (current) use of anticoagulants: Secondary | ICD-10-CM | POA: Insufficient documentation

## 2024-01-31 DIAGNOSIS — R7303 Prediabetes: Secondary | ICD-10-CM | POA: Insufficient documentation

## 2024-01-31 DIAGNOSIS — I5022 Chronic systolic (congestive) heart failure: Secondary | ICD-10-CM | POA: Diagnosis not present

## 2024-01-31 DIAGNOSIS — Z7902 Long term (current) use of antithrombotics/antiplatelets: Secondary | ICD-10-CM | POA: Insufficient documentation

## 2024-01-31 DIAGNOSIS — G4733 Obstructive sleep apnea (adult) (pediatric): Secondary | ICD-10-CM | POA: Insufficient documentation

## 2024-01-31 DIAGNOSIS — I2510 Atherosclerotic heart disease of native coronary artery without angina pectoris: Secondary | ICD-10-CM | POA: Diagnosis not present

## 2024-01-31 HISTORY — DX: Anxiety disorder, unspecified: F41.9

## 2024-01-31 HISTORY — DX: Depression, unspecified: F32.A

## 2024-01-31 HISTORY — DX: Prediabetes: R73.03

## 2024-01-31 LAB — COMPREHENSIVE METABOLIC PANEL WITH GFR
ALT: 65 U/L — ABNORMAL HIGH (ref 0–44)
AST: 31 U/L (ref 15–41)
Albumin: 4.5 g/dL (ref 3.5–5.0)
Alkaline Phosphatase: 66 U/L (ref 38–126)
Anion gap: 12 (ref 5–15)
BUN: 18 mg/dL (ref 6–20)
CO2: 24 mmol/L (ref 22–32)
Calcium: 9.5 mg/dL (ref 8.9–10.3)
Chloride: 102 mmol/L (ref 98–111)
Creatinine, Ser: 0.86 mg/dL (ref 0.61–1.24)
GFR, Estimated: >60 mL/min (ref >60)
Glucose, Bld: 137 mg/dL — ABNORMAL HIGH (ref 70–99)
Potassium: 4 mmol/L (ref 3.5–5.1)
Sodium: 138 mmol/L (ref 135–145)
Total Bilirubin: 0.6 mg/dL (ref 0.0–1.2)
Total Protein: 6.8 g/dL (ref 6.5–8.1)

## 2024-01-31 LAB — CBC WITH DIFFERENTIAL/PLATELET
Abs Immature Granulocytes: 0.03 K/uL (ref 0.00–0.07)
Basophils Absolute: 0.1 K/uL (ref 0.0–0.1)
Basophils Relative: 1 %
Eosinophils Absolute: 0.3 K/uL (ref 0.0–0.5)
Eosinophils Relative: 4 %
HCT: 50.6 % (ref 39.0–52.0)
Hemoglobin: 16.2 g/dL (ref 13.0–17.0)
Immature Granulocytes: 0 %
Lymphocytes Relative: 29 %
Lymphs Abs: 2.1 K/uL (ref 0.7–4.0)
MCH: 28.2 pg (ref 26.0–34.0)
MCHC: 32 g/dL (ref 30.0–36.0)
MCV: 88.2 fL (ref 80.0–100.0)
Monocytes Absolute: 0.6 K/uL (ref 0.1–1.0)
Monocytes Relative: 8 %
Neutro Abs: 4.3 K/uL (ref 1.7–7.7)
Neutrophils Relative %: 58 %
Platelets: 232 K/uL (ref 150–400)
RBC: 5.74 MIL/uL (ref 4.22–5.81)
RDW: 12.1 % (ref 11.5–15.5)
WBC: 7.3 K/uL (ref 4.0–10.5)
nRBC: 0 % (ref 0.0–0.2)

## 2024-02-01 ENCOUNTER — Encounter (HOSPITAL_COMMUNITY): Payer: Self-pay

## 2024-02-01 NOTE — Anesthesia Preprocedure Evaluation (Addendum)
 Anesthesia Evaluation  Patient identified by MRN, date of birth, ID band Patient awake    Reviewed: Allergy & Precautions, Patient's Chart, lab work & pertinent test results, reviewed documented beta blocker date and time   History of Anesthesia Complications Negative for: history of anesthetic complications  Airway Mallampati: I       Dental no notable dental hx. (+) Dental Advisory Given   Pulmonary sleep apnea , neg COPD, neg recent URI, former smoker   breath sounds clear to auscultation       Cardiovascular hypertension, (-) angina + CAD, + Cardiac Stents and +CHF  + pacemaker  Rhythm:Regular Rate:Normal - Systolic murmurs TTE (2024): 1. Left ventricular ejection fraction, by estimation, is 60 to 65%. The  left ventricle has normal function. The left ventricle has no regional  wall motion abnormalities. The left ventricular internal cavity size was  mildly dilated. There is mild  concentric left ventricular hypertrophy. Left ventricular diastolic  parameters were normal.   2. Right ventricular systolic function is normal. The right ventricular  size is normal.   3. Left atrial size was mildly dilated.   4. The mitral valve is normal in structure. No evidence of mitral valve  regurgitation. No evidence of mitral stenosis.   5. The aortic valve is tricuspid. Aortic valve regurgitation is not  visualized. No aortic stenosis is present.   6. The inferior vena cava is normal in size with greater than 50%  respiratory variability, suggesting right atrial pressure of 3 mmHg.     Neuro/Psych  PSYCHIATRIC DISORDERS Anxiety Depression       GI/Hepatic ,neg GERD  ,,  Endo/Other  neg diabetes    Renal/GU Renal disease     Musculoskeletal   Abdominal   Peds  Hematology   Anesthesia Other Findings   Reproductive/Obstetrics                              Anesthesia Physical Anesthesia  Plan  ASA: 3  Anesthesia Plan: General   Post-op Pain Management:    Induction: Intravenous and Rapid sequence  PONV Risk Score and Plan: 1 and Ondansetron , Aprepitant, Dexamethasone and Scopolamine patch - Pre-op  Airway Management Planned: Oral ETT  Additional Equipment:   Intra-op Plan:   Post-operative Plan: Extubation in OR  Informed Consent:      Dental advisory given  Plan Discussed with:   Anesthesia Plan Comments: (See PAT note from 9/24.  48 yo male with PMH of former smoking, HTN, A.fib on Xarelto , CAD s/p PCI to LCx (2022) and LAD (2024), systolic CHF, OSA (uses CPAP), hiatal hernia, prediabetes, anxiety, depression, obesity (BMI 42) - scheduled for Gastric Sleeve. Plan for GETA with RSI, PIV x 2. )         Anesthesia Quick Evaluation

## 2024-02-01 NOTE — Progress Notes (Signed)
 Case: [id number] Date/Time: 02/05/24 0715   Procedures:      GASTRECTOMY, SLEEVE, LAPAROSCOPIC     ENDOSCOPY, UPPER GI TRACT     REPAIR, HERNIA, HIATAL   Anesthesia type: General   Diagnosis: Morbid obesity (HCC) [E66.01]   Pre-op diagnosis: MORBID OBESITY   Location: [facility] ROOM 02 / [facility]   Surgeons: Kinsinger, Herlene Righter, MD       DISCUSSION: Alexander [name] is a 48 yo male with PMH of former smoking, HTN, A.fib on Xarelto , CAD s/p PCI to LCx (2022) and LAD (2024), systolic CHF, OSA (uses CPAP), hiatal hernia, prediabetes, anxiety, depression, obesity (BMI 42).  Patient diagnosed with AF and CHF (EF 35%) in 2021. Underwent emergent DCCV in 2022 after EF dropped to <20% and he was in cardiogenic shock. Stabilized and underwent left heart cath in 08/2020 and had 3 vessel CAD with PCI to LCx. Follow up echo in 2022 showed improvement in EF to 55-60%. Underwent repeat LHC in 07/2022 and had PCI to mid LAD lesion. Echo in 2024 showed normal LVEF 60-65%. Patient last seen in clinic on 12/06/23. Noted to be stable at that visit. Cleared as high risk by Dr. Santo:  He is at high risk for a cardiac patient going into non-cardiac surgery but he is very functional and asymptomatic.  With aggressive therapy his cardiac conditions are well managed.  Surgery is reasonable to proceed with.  Goal would be to minimize time of anticoagulation.  Farxiga : LD 9/25 Xarelto : LD 9/26   VS: BP (!) 133/96   Pulse 69   Temp 36.9 C (Oral)   Resp 16   Ht 6' (1.829 m)   Wt (!) 140.6 kg   SpO2 98%   BMI 42.04 kg/m   PROVIDERS: Santo Domino, MD   LABS: Labs reviewed: Acceptable for surgery. (all labs ordered are listed, but only abnormal results are displayed)  Labs Reviewed  COMPREHENSIVE METABOLIC PANEL WITH GFR - Abnormal; Notable for the following components:      Result Value   Glucose, Bld 137 (*)    ALT 65 (*)    All other components within normal limits  CBC WITH  DIFFERENTIAL/PLATELET  TYPE AND SCREEN     IMAGES:   EKG 07/04/23:  Normal sinus rhythm Inferior infarct , age undetermined Possible Anterolateral infarct , age undetermined  Echo 07/14/22:  IMPRESSIONS    1. Left ventricular ejection fraction, by estimation, is 60 to 65%. The left ventricle has normal function. The left ventricle has no regional wall motion abnormalities. The left ventricular internal cavity size was mildly dilated. There is mild concentric left ventricular hypertrophy. Left ventricular diastolic parameters were normal.  2. Right ventricular systolic function is normal. The right ventricular size is normal.  3. Left atrial size was mildly dilated.  4. The mitral valve is normal in structure. No evidence of mitral valve regurgitation. No evidence of mitral stenosis.  5. The aortic valve is tricuspid. Aortic valve regurgitation is not visualized. No aortic stenosis is present.  6. The inferior vena cava is normal in size with greater than 50% respiratory variability, suggesting right atrial pressure of 3 mmHg.  LHC 08/02/23:    Prox LAD to Mid LAD lesion is 80% stenosed.-Progression of disease (stenosis confirmed via OCT)   A drug-eluting stent was successfully placed using a SYNERGY XD [id number]. ->  Deployed to 3.7 mm.  Taper postdilation from 4.6-4.22-3.7 mm = full expansion. Post intervention, there is a 0% residual stenosis.  Mid RCA lesion is 45% stenosed.  RPDA lesion is 55% stenosed.   Previously placed Ost Cx to Prox Cx stent of unknown type is  widely patent.   The left ventricular systolic function is normal.   The left ventricular ejection fraction is 50-55% by visual estimate.   There is no aortic valve stenosis.   POST-OPERATIVE DIAGNOSIS:   Culprit Lesion: Progression of mid LAD lesion just after diagonal branch at SP1 in progress from 70% to roughly 80% OCT guided PCI from major diagonal branch just beyond the next branch: Synergy XD 3.5 mm x  28 mm deployed to 3.7 mm and postdilated in tapered fashion from 4.6-4.2-3.7 mm Widely patent LCx stent Distal RCA 45% and ostial PDA 55 % stenosis. LV gram relatively poor filling but EF does appear to be low normal 50 to 55%.  (Defer to echo)     PLAN OF CARE:  Overnight monitoring, restart Xarelto  at p.m. dosing tonight; would do 1 month of aspirin , Plavix  and Xarelto  and then discontinue aspirin .  Continue Plavix  for 6 months total,then discontinue  Past Medical History:  Diagnosis Date   Anxiety    Arrhythmia    Atrial fibrillation (HCC)    CAD (coronary artery disease) 08/17/2020   PCI + DES to pLCx, 70% mLAD negative FFR testing (medical therapy)    CHF (congestive heart failure) (HCC)    Chronic systolic heart failure (HCC)    Depression    Dysplastic nevi 07/12/2023   Severe atypia - right paraspinal superior - Ex needed Moderate - right paraspinal inferior - Ex needed Severe atypia - mid back - Ex needed     Gout    Hyperlipidemia    Hypertension    OSA (obstructive sleep apnea) 09/2020   cpap   Pre-diabetes     Past Surgical History:  Procedure Laterality Date   CARDIAC CATHETERIZATION     CARDIOVERSION N/A 05/15/2020   Procedure: CARDIOVERSION;  Surgeon: Cherrie Toribio SAUNDERS, MD;  Location: [facility] ENDOSCOPY;  Service: Cardiovascular;  Laterality: N/A;   CORONARY IMAGING/OCT N/A 07/13/2022   Procedure: INTRAVASCULAR IMAGING/OCT;  Surgeon: Anner Alm ORN, MD;  Location: [facility] INVASIVE CV LAB;  Service: Cardiovascular;  Laterality: N/A;   CORONARY STENT INTERVENTION N/A 08/17/2020   Procedure: CORONARY STENT INTERVENTION;  Surgeon: Verlin Lonni BIRCH, MD;  Location: [facility] INVASIVE CV LAB;  Service: Cardiovascular;  Laterality: N/A;   CORONARY STENT INTERVENTION N/A 07/13/2022   Procedure: CORONARY STENT INTERVENTION;  Surgeon: Anner Alm ORN, MD;  Location: [facility] INVASIVE CV LAB;  Service: Cardiovascular;  Laterality: N/A;   LEFT HEART CATH AND CORONARY ANGIOGRAPHY N/A  07/13/2022   Procedure: LEFT HEART CATH AND CORONARY ANGIOGRAPHY;  Surgeon: Anner Alm ORN, MD;  Location: [facility] INVASIVE CV LAB;  Service: Cardiovascular;  Laterality: N/A;   left shoulder scope     MOLE REMOVAL  07/27/2023   RIGHT/LEFT HEART CATH AND CORONARY ANGIOGRAPHY N/A 08/17/2020   Procedure: RIGHT/LEFT HEART CATH AND CORONARY ANGIOGRAPHY;  Surgeon: Cherrie Toribio SAUNDERS, MD;  Location: [facility] INVASIVE CV LAB;  Service: Cardiovascular;  Laterality: N/A;   SEPTOPLASTY      MEDICATIONS:  acetaminophen  (TYLENOL ) 500 MG tablet   allopurinol  (ZYLOPRIM ) 100 MG tablet   buPROPion  (WELLBUTRIN  SR) 150 MG 12 hr tablet   carvedilol  (COREG ) 6.25 MG tablet   cetirizine (ZYRTEC) 10 MG tablet   colchicine 0.6 MG tablet   dapagliflozin  propanediol (FARXIGA ) 10 MG TABS tablet   doxazosin  (CARDURA ) 2 MG tablet   furosemide  (  LASIX ) 20 MG tablet   isosorbide  mononitrate (IMDUR ) 30 MG 24 hr tablet   loperamide  (IMODIUM ) 2 MG capsule   Multiple Vitamin (MULTIVITAMIN WITH MINERALS) TABS tablet   nitroGLYCERIN  (NITROSTAT ) 0.4 MG SL tablet   potassium chloride  SA (KLOR-CON  M) 20 MEQ tablet   risedronate (ACTONEL) 150 MG tablet   rosuvastatin  (CRESTOR ) 40 MG tablet   sacubitril -valsartan  (ENTRESTO ) 97-103 MG   spironolactone  (ALDACTONE ) 25 MG tablet   tiZANidine  (ZANAFLEX ) 4 MG tablet   traMADol  (ULTRAM ) 50 MG tablet   XARELTO  20 MG TABS tablet   No current facility-administered medications for this encounter.   Burnard CHRISTELLA Odis DEVONNA [facility] Short Stay/Anesthesiology [facility] Home Phone [phone] 02/01/2024 9:42 AM

## 2024-02-01 NOTE — Discharge Instructions (Addendum)
 GASTRIC BYPASS / SLEEVE  Home Care Instructions  These instructions are to help you care for yourself when you go home.  Call: If you have any problems. Call [phone] and ask for the surgeon on call If you have an emergency related to your surgery please use the ER at [facility].  Tell the ER staff that you are a new post-op gastric bypass or gastric sleeve patient   Signs and symptoms to report: Severe vomiting or nausea If you cannot handle clear liquids for longer than 1 day, call your surgeon  Abdominal pain which does not get better after taking your pain medication Fever greater than 100.4 F and chills Heart rate over 100 beats a minute Trouble breathing Chest pain  Redness, swelling, drainage, or foul odor at incision (surgical) sites  If your incisions open or pull apart Swelling or pain in calf (lower leg) Diarrhea (Loose bowel movements that happen often), frequent watery, uncontrolled bowel movements Constipation, (no bowel movements for 3 days) if this happens:  Take Milk of Magnesia, 2 tablespoons by mouth, 3 times a day for 2 days if needed Stop taking Milk of Magnesia once you have had a bowel movement Call your doctor if constipation continues Or Take Miralax  (instead of Milk of Magnesia) following the label instructions Stop taking Miralax once you have had a bowel movement Call your doctor if constipation continues Anything you think is "abnormal for you"   Normal side effects after surgery: Unable to sleep at night or unable to concentrate Irritability Being tearful (crying) or depressed These are common complaints, possibly related to your anesthesia, stress of surgery and change in lifestyle, that usually go away a few weeks after surgery.  If these feelings continue, call your medical doctor.  Wound Care: You may have surgical glue, steri-strips, or staples over your incisions after surgery Surgical glue:  Looks like a clear film over your incisions  and will wear off a little at a time Steri-strips : Adhesive strips of tape over your incisions. You may notice a yellowish color on the skin under the steri-strips. This is used to make the   steri-strips stick better. Do not pull the steri-strips off - let them fall off Staples: Staples may be removed before you leave the hospital If you go home with staples, call [facility] at for an appointment with your surgeon's nurse to have staples removed 10 days after surgery, [phone] Showering: You may shower two (2) days after your surgery unless your surgeon tells you differently Wash gently around incisions with warm soapy water , rinse well, and gently pat dry  If you have a drain (tube from your incision), you may need someone to hold this while you shower  No tub baths until staples are removed and incisions are healed     Medications: Medications should be liquid or crushed if larger than the size of a dime Extended release pills (medication that releases a little bit at a time through the day) should not be crushed Depending on the size and number of medications you take, you may need to space (take a few throughout the day)/change the time you take your medications so that you do not over-fill your pouch (smaller stomach) Make sure you follow-up with your primary care physician to make medication changes needed during rapid weight loss and life-style changes If you have diabetes, follow up with the doctor that orders your diabetes medication(s) within one week after surgery and check  your blood sugar regularly. Do not drive while taking narcotics (pain medications) DO NOT take NSAID'S (Examples of NSAID's include ibuprofen, naproxen )  Diet:                    First 2 Weeks  You will see the nutritionist about two (2) weeks after your surgery. The nutritionist will increase the types of foods you can eat if you are handling liquids well: If you have severe vomiting or nausea  and cannot handle clear liquids lasting longer than 1 day, call your surgeon  Protein Shake Drink at least 2 ounces of shake 5-6 times per day Each serving of protein shakes (usually 8 - 12 ounces) should have a minimum of:  15 grams of protein  And no more than 5 grams of carbohydrate  Goal for protein each day: Men = 80 grams per day Women = 60 grams per day Protein powder may be added to fluids such as non-fat milk or Lactaid milk or Soy milk (limit to 35 grams added protein powder per serving)  Hydration Slowly increase the amount of water  and other clear liquids as tolerated (See Acceptable Fluids) Slowly increase the amount of protein shake as tolerated   Sip fluids slowly and throughout the day May use sugar substitutes in small amounts (no more than 6 - 8 packets per day; i.e. Splenda)  Fluid Goal The first goal is to drink at least 8 ounces of protein shake/drink per day (or as directed by the nutritionist);  See handout from pre-op Bariatric Education Class for examples of protein shake/drink.   Slowly increase the amount of protein shake you drink as tolerated You may find it easier to slowly sip shakes throughout the day It is important to get your proteins in first Your fluid goal is to drink 64 - 100 ounces of fluid daily It may take a few weeks to build up to this 32 oz (or more) should be clear liquids  And  32 oz (or more) should be full liquids (see below for examples) Liquids should not contain sugar, caffeine, or carbonation  Clear Liquids: Water  or Sugar-free flavored water  (i.e. Fruit H2O, Propel) Decaffeinated coffee or tea (sugar-free) Crystal Lite, Wyler's Lite, Minute Maid Lite Sugar-free Jell-O Bouillon or broth Sugar-free Popsicle:   *Less than 20 calories each; Limit 1 per day  Full Liquids: Protein Shakes/Drinks + 2 choices per day of other full liquids Full liquids must be: No More Than 12 grams of Carbs per serving  No More Than 3 grams of Fat  per serving Strained low-fat cream soup Non-Fat milk Fat-free Lactaid Milk Sugar-free yogurt (Dannon Lite & Fit, Greek yogurt)      Vitamins and Minerals Start 1 day after surgery unless otherwise directed by your surgeon Bariatric Specific Complete Multivitamins Chewable Calcium  Citrate with Vitamin D -3 (Example: 3 Chewable Calcium  Plus 600 with Vitamin D -3) Take 500 mg three (3) times a day for a total of 1500 mg each day Do not take all 3 doses of calcium  at one time as it may cause constipation, and you can only absorb 500 mg  at a time  Do not mix multivitamins containing iron with calcium  supplements; take 2 hours apart  Menstruating women and those at risk for anemia (a blood disease that causes weakness) may need extra iron Talk with your doctor to see if you need more iron If you need extra iron: Total daily Iron recommendation (including Vitamins) is 50 to 100  mg Iron/day Do not stop taking or change any vitamins or minerals until you talk to your nutritionist or surgeon Your nutritionist and/or surgeon must approve all vitamin and mineral supplements   Activity and Exercise: It is important to continue walking at home.  Limit your physical activity as instructed by your doctor.  During this time, use these guidelines: Do not lift anything greater than ten (10) pounds for at least two (2) weeks Do not go back to work or drive until Designer, industrial/product says you can You may have sex when you feel comfortable  It is VERY important for male patients to use a reliable birth control method; fertility often increases after surgery  Do not get pregnant for at least 18 months Start exercising as soon as your doctor tells you that you can Make sure your doctor approves any physical activity Start with a simple walking program Walk 5-15 minutes each day, 7 days per week.  Slowly increase until you are walking 30-45 minutes per day Consider joining our BELT program. [phone] or email  [email or web address]   Special Instructions Things to remember:  Use your CPAP when sleeping if this applies to you, do not stop the use of CPAP unless directed by physician after a sleep study [facility] has a free Bariatric Surgery Support Group that meets monthly, the 3rd Thursday, 6 pm.  Please review discharge information for date and location of this meeting. It is very important to keep all follow up appointments with your surgeon, nutritionist, primary care physician, and behavioral health practitioner After the first year, please follow up with your bariatric surgeon and nutritionist at least once a year in order to maintain best weight loss results   [facility]: [phone] [facility]: [phone] Bariatric Nurse Coordinator: [phone]

## 2024-02-02 ENCOUNTER — Other Ambulatory Visit: Payer: Self-pay | Admitting: Internal Medicine

## 2024-02-05 ENCOUNTER — Ambulatory Visit (HOSPITAL_COMMUNITY): Payer: Self-pay | Admitting: Physician Assistant

## 2024-02-05 ENCOUNTER — Other Ambulatory Visit: Payer: Self-pay

## 2024-02-05 ENCOUNTER — Encounter (HOSPITAL_COMMUNITY): Payer: Self-pay | Admitting: General Surgery

## 2024-02-05 ENCOUNTER — Encounter (HOSPITAL_COMMUNITY): Admission: RE | Disposition: A | Payer: Self-pay | Source: Home / Self Care | Attending: General Surgery

## 2024-02-05 ENCOUNTER — Observation Stay (HOSPITAL_COMMUNITY)
Admission: RE | Admit: 2024-02-05 | Discharge: 2024-02-06 | Disposition: A | Discharge status: Home / Self Care | Attending: General Surgery | Admitting: General Surgery

## 2024-02-05 ENCOUNTER — Ambulatory Visit (HOSPITAL_COMMUNITY)

## 2024-02-05 DIAGNOSIS — I2510 Atherosclerotic heart disease of native coronary artery without angina pectoris: Secondary | ICD-10-CM | POA: Insufficient documentation

## 2024-02-05 DIAGNOSIS — Z87891 Personal history of nicotine dependence: Secondary | ICD-10-CM | POA: Insufficient documentation

## 2024-02-05 DIAGNOSIS — G4733 Obstructive sleep apnea (adult) (pediatric): Secondary | ICD-10-CM | POA: Diagnosis not present

## 2024-02-05 DIAGNOSIS — Z79899 Other long term (current) drug therapy: Secondary | ICD-10-CM | POA: Diagnosis not present

## 2024-02-05 DIAGNOSIS — Z01818 Encounter for other preprocedural examination: Secondary | ICD-10-CM

## 2024-02-05 DIAGNOSIS — Z6841 Body Mass Index (BMI) 40.0 and over, adult: Secondary | ICD-10-CM | POA: Diagnosis not present

## 2024-02-05 DIAGNOSIS — I5022 Chronic systolic (congestive) heart failure: Secondary | ICD-10-CM | POA: Diagnosis not present

## 2024-02-05 DIAGNOSIS — I480 Paroxysmal atrial fibrillation: Secondary | ICD-10-CM | POA: Diagnosis not present

## 2024-02-05 DIAGNOSIS — E66813 Obesity, class 3: Principal | ICD-10-CM | POA: Insufficient documentation

## 2024-02-05 DIAGNOSIS — K449 Diaphragmatic hernia without obstruction or gangrene: Secondary | ICD-10-CM | POA: Diagnosis not present

## 2024-02-05 DIAGNOSIS — Z950 Presence of cardiac pacemaker: Secondary | ICD-10-CM | POA: Diagnosis not present

## 2024-02-05 HISTORY — PX: LAPAROSCOPIC GASTRIC SLEEVE RESECTION: SHX5895

## 2024-02-05 HISTORY — PX: UPPER GI ENDOSCOPY: SHX6162

## 2024-02-05 HISTORY — PX: HIATAL HERNIA REPAIR: SHX195

## 2024-02-05 LAB — ABO/RH: ABO/RH(D): O POS

## 2024-02-05 LAB — TYPE AND SCREEN
ABO/RH(D): O POS
Antibody Screen: NEGATIVE

## 2024-02-05 LAB — CBC
HCT: 46.3 % (ref 39.0–52.0)
Hemoglobin: 15.5 g/dL (ref 13.0–17.0)
MCH: 29.5 pg (ref 26.0–34.0)
MCHC: 33.5 g/dL (ref 30.0–36.0)
MCV: 88 fL (ref 80.0–100.0)
Platelets: 198 K/uL (ref 150–400)
RBC: 5.26 MIL/uL (ref 4.22–5.81)
RDW: 12.1 % (ref 11.5–15.5)
WBC: 13.5 K/uL — ABNORMAL HIGH (ref 4.0–10.5)
nRBC: 0 % (ref 0.0–0.2)

## 2024-02-05 LAB — HEMOGLOBIN A1C
Hgb A1c MFr Bld: 6 % — ABNORMAL HIGH (ref 4.8–5.6)
Mean Plasma Glucose: 125.5 mg/dL

## 2024-02-05 LAB — GLUCOSE, CAPILLARY
Glucose-Capillary: 180 mg/dL — ABNORMAL HIGH (ref 70–99)
Glucose-Capillary: 200 mg/dL — ABNORMAL HIGH (ref 70–99)
Glucose-Capillary: 200 mg/dL — ABNORMAL HIGH (ref 70–99)

## 2024-02-05 LAB — CREATININE, SERUM
Creatinine, Ser: 0.89 mg/dL (ref 0.61–1.24)
GFR, Estimated: >60 mL/min (ref >60)

## 2024-02-05 SURGERY — GASTRECTOMY, SLEEVE, LAPAROSCOPIC
Anesthesia: General

## 2024-02-05 MED ORDER — OXYCODONE HCL 5 MG PO TABS
5.0000 mg | ORAL_TABLET | Freq: Once | ORAL | Status: AC | PRN
  Administered 2024-02-05: 5 mg via ORAL

## 2024-02-05 MED ORDER — ISOSORBIDE MONONITRATE ER 30 MG PO TB24
30.0000 mg | ORAL_TABLET | Freq: Every day | ORAL | Status: DC
  Administered 2024-02-06: 30 mg via ORAL
  Filled 2024-02-05: qty 1

## 2024-02-05 MED ORDER — HYDRALAZINE HCL 20 MG/ML IJ SOLN
INTRAMUSCULAR | Status: AC
  Filled 2024-02-05: qty 1

## 2024-02-05 MED ORDER — CHLORHEXIDINE GLUCONATE CLOTH 2 % EX PADS
6.0000 | MEDICATED_PAD | Freq: Once | CUTANEOUS | Status: DC
Start: 2024-02-05 — End: 2024-02-05

## 2024-02-05 MED ORDER — PROPOFOL 10 MG/ML IV BOLUS
INTRAVENOUS | Status: DC | PRN
Start: 2024-02-05 — End: 2024-02-05
  Administered 2024-02-05: 250 mg via INTRAVENOUS

## 2024-02-05 MED ORDER — ENSURE MAX PROTEIN PO LIQD
2.0000 [oz_av] | ORAL | Status: DC
  Administered 2024-02-06: 2 [oz_av] via ORAL

## 2024-02-05 MED ORDER — BUPIVACAINE-EPINEPHRINE (PF) 0.25% -1:200000 IJ SOLN
INTRAMUSCULAR | Status: AC
Start: 2024-02-05 — End: 2024-02-05
  Filled 2024-02-05: qty 30

## 2024-02-05 MED ORDER — HEPARIN SODIUM (PORCINE) 5000 UNIT/ML IJ SOLN
5000.0000 [IU] | Freq: Three times a day (TID) | INTRAMUSCULAR | Status: DC
  Administered 2024-02-05 – 2024-02-06 (×2): 5000 [IU] via SUBCUTANEOUS
  Filled 2024-02-05 (×2): qty 1

## 2024-02-05 MED ORDER — PROPOFOL 10 MG/ML IV BOLUS
INTRAVENOUS | Status: AC
  Filled 2024-02-05: qty 20

## 2024-02-05 MED ORDER — EPHEDRINE SULFATE-NACL 50-0.9 MG/10ML-% IV SOSY
PREFILLED_SYRINGE | INTRAVENOUS | Status: DC | PRN
  Administered 2024-02-05: 5 mg via INTRAVENOUS

## 2024-02-05 MED ORDER — SCOPOLAMINE 1 MG/3DAYS TD PT72
1.0000 | MEDICATED_PATCH | TRANSDERMAL | Status: DC
  Administered 2024-02-05: 1 mg via TRANSDERMAL
  Filled 2024-02-05: qty 1

## 2024-02-05 MED ORDER — LIDOCAINE HCL (PF) 2 % IJ SOLN
INTRAMUSCULAR | Status: AC
Start: 2024-02-05 — End: 2024-02-05
  Filled 2024-02-05: qty 5

## 2024-02-05 MED ORDER — STERILE WATER FOR IRRIGATION IR SOLN
Status: DC | PRN
  Administered 2024-02-05: 1000 mL

## 2024-02-05 MED ORDER — SUCCINYLCHOLINE CHLORIDE 200 MG/10ML IV SOSY
PREFILLED_SYRINGE | INTRAVENOUS | Status: AC
  Filled 2024-02-05: qty 10

## 2024-02-05 MED ORDER — OXYCODONE HCL 5 MG/5ML PO SOLN: 5.0000 mg | Freq: Four times a day (QID) | ORAL | Status: DC | PRN

## 2024-02-05 MED ORDER — ONDANSETRON HCL 4 MG/2ML IJ SOLN: 4.0000 mg | INTRAMUSCULAR | Status: DC | PRN

## 2024-02-05 MED ORDER — ACETAMINOPHEN 500 MG PO TABS
1000.0000 mg | ORAL_TABLET | ORAL | Status: AC
Start: 2024-02-05 — End: 2024-02-05
  Administered 2024-02-05: 1000 mg via ORAL
  Filled 2024-02-05: qty 2

## 2024-02-05 MED ORDER — LORATADINE 10 MG PO TABS
10.0000 mg | ORAL_TABLET | Freq: Every day | ORAL | Status: DC
  Administered 2024-02-06: 10 mg via ORAL
  Filled 2024-02-05: qty 1

## 2024-02-05 MED ORDER — 0.9 % SODIUM CHLORIDE (POUR BTL) OPTIME
TOPICAL | Status: DC | PRN
  Administered 2024-02-05: 1000 mL

## 2024-02-05 MED ORDER — ACETAMINOPHEN 500 MG PO TABS
1000.0000 mg | ORAL_TABLET | Freq: Three times a day (TID) | ORAL | Status: DC
  Administered 2024-02-05 – 2024-02-06 (×3): 1000 mg via ORAL
  Filled 2024-02-05 (×3): qty 2

## 2024-02-05 MED ORDER — LIDOCAINE HCL 2 % IJ SOLN
INTRAMUSCULAR | Status: AC
  Filled 2024-02-05: qty 20

## 2024-02-05 MED ORDER — DEXAMETHASONE SODIUM PHOSPHATE 10 MG/ML IJ SOLN
INTRAMUSCULAR | Status: AC
  Filled 2024-02-05: qty 1

## 2024-02-05 MED ORDER — FENTANYL CITRATE PF 50 MCG/ML IJ SOSY
PREFILLED_SYRINGE | INTRAMUSCULAR | Status: AC
  Filled 2024-02-05: qty 1

## 2024-02-05 MED ORDER — MORPHINE SULFATE (PF) 2 MG/ML IV SOLN: 1.0000 mg | INTRAVENOUS | Status: DC | PRN

## 2024-02-05 MED ORDER — OXYCODONE HCL 5 MG PO TABS
ORAL_TABLET | ORAL | Status: AC
  Filled 2024-02-05: qty 1

## 2024-02-05 MED ORDER — SUGAMMADEX SODIUM 200 MG/2ML IV SOLN
INTRAVENOUS | Status: DC | PRN
  Administered 2024-02-05: 300 mg via INTRAVENOUS

## 2024-02-05 MED ORDER — ONDANSETRON HCL 4 MG/2ML IJ SOLN: 4.0000 mg | Freq: Once | INTRAMUSCULAR | Status: DC | PRN

## 2024-02-05 MED ORDER — MIDAZOLAM HCL 5 MG/5ML IJ SOLN
INTRAMUSCULAR | Status: DC | PRN
  Administered 2024-02-05: 2 mg via INTRAVENOUS

## 2024-02-05 MED ORDER — SUGAMMADEX SODIUM 200 MG/2ML IV SOLN
INTRAVENOUS | Status: AC
  Filled 2024-02-05: qty 4

## 2024-02-05 MED ORDER — BUPIVACAINE LIPOSOME 1.3 % IJ SUSP: 20.0000 mL | Freq: Once | INTRAMUSCULAR | Status: DC

## 2024-02-05 MED ORDER — ACETAMINOPHEN 10 MG/ML IV SOLN: 1000.0000 mg | Freq: Once | INTRAVENOUS | Status: DC | PRN

## 2024-02-05 MED ORDER — FENTANYL CITRATE PF 50 MCG/ML IJ SOSY
PREFILLED_SYRINGE | INTRAMUSCULAR | Status: AC
  Filled 2024-02-05: qty 2

## 2024-02-05 MED ORDER — DROPERIDOL 2.5 MG/ML IJ SOLN: 0.6250 mg | Freq: Once | INTRAMUSCULAR | Status: DC | PRN

## 2024-02-05 MED ORDER — FENTANYL CITRATE (PF) 250 MCG/5ML IJ SOLN
INTRAMUSCULAR | Status: AC
  Filled 2024-02-05: qty 5

## 2024-02-05 MED ORDER — PHENYLEPHRINE 80 MCG/ML (10ML) SYRINGE FOR IV PUSH (FOR BLOOD PRESSURE SUPPORT)
PREFILLED_SYRINGE | INTRAVENOUS | Status: DC | PRN
Start: 2024-02-05 — End: 2024-02-05
  Administered 2024-02-05: 160 ug via INTRAVENOUS
  Administered 2024-02-05 (×2): 80 ug via INTRAVENOUS
  Administered 2024-02-05: 160 ug via INTRAVENOUS

## 2024-02-05 MED ORDER — LACTATED RINGERS IR SOLN
Status: DC | PRN
  Administered 2024-02-05: 3000 mL

## 2024-02-05 MED ORDER — INSULIN ASPART 100 UNIT/ML IJ SOLN
0.0000 [IU] | INTRAMUSCULAR | Status: DC
  Administered 2024-02-05 – 2024-02-06 (×5): 3 [IU] via SUBCUTANEOUS
  Administered 2024-02-06: 2 [IU] via SUBCUTANEOUS

## 2024-02-05 MED ORDER — ORAL CARE MOUTH RINSE: 15.0000 mL | Freq: Once | OROMUCOSAL | Status: AC

## 2024-02-05 MED ORDER — EPHEDRINE 5 MG/ML INJ
INTRAVENOUS | Status: AC
  Filled 2024-02-05: qty 5

## 2024-02-05 MED ORDER — ONDANSETRON HCL 4 MG/2ML IJ SOLN
INTRAMUSCULAR | Status: DC | PRN
  Administered 2024-02-05: 4 mg via INTRAVENOUS

## 2024-02-05 MED ORDER — DEXAMETHASONE SODIUM PHOSPHATE 10 MG/ML IJ SOLN
INTRAMUSCULAR | Status: DC | PRN
  Administered 2024-02-05: 10 mg via INTRAVENOUS

## 2024-02-05 MED ORDER — GABAPENTIN 100 MG PO CAPS
200.0000 mg | ORAL_CAPSULE | Freq: Two times a day (BID) | ORAL | Status: DC
  Administered 2024-02-05 – 2024-02-06 (×3): 200 mg via ORAL
  Filled 2024-02-05 (×3): qty 2

## 2024-02-05 MED ORDER — BUPIVACAINE-EPINEPHRINE (PF) 0.25% -1:200000 IJ SOLN
INTRAMUSCULAR | Status: AC
  Filled 2024-02-05: qty 30

## 2024-02-05 MED ORDER — CHLORHEXIDINE GLUCONATE CLOTH 2 % EX PADS: 6.0000 | MEDICATED_PAD | Freq: Once | CUTANEOUS | Status: DC

## 2024-02-05 MED ORDER — NITROGLYCERIN 0.4 MG SL SUBL: 0.4000 mg | SUBLINGUAL_TABLET | SUBLINGUAL | Status: DC | PRN

## 2024-02-05 MED ORDER — HYDRALAZINE HCL 20 MG/ML IJ SOLN: 10.0000 mg | INTRAMUSCULAR | Status: DC | PRN

## 2024-02-05 MED ORDER — FENTANYL CITRATE (PF) 250 MCG/5ML IJ SOLN
INTRAMUSCULAR | Status: DC | PRN
  Administered 2024-02-05 (×2): 50 ug via INTRAVENOUS
  Administered 2024-02-05: 100 ug via INTRAVENOUS
  Administered 2024-02-05: 50 ug via INTRAVENOUS

## 2024-02-05 MED ORDER — SODIUM CHLORIDE 0.9 % IV SOLN
2.0000 g | INTRAVENOUS | Status: AC
  Administered 2024-02-05: 2 g via INTRAVENOUS
  Filled 2024-02-05: qty 2

## 2024-02-05 MED ORDER — DEXAMETHASONE SODIUM PHOSPHATE 10 MG/ML IJ SOLN: 4.0000 mg | INTRAMUSCULAR | Status: DC

## 2024-02-05 MED ORDER — KETAMINE HCL 10 MG/ML IJ SOLN
INTRAMUSCULAR | Status: DC | PRN
  Administered 2024-02-05: 20 mg via INTRAVENOUS
  Administered 2024-02-05: 30 mg via INTRAVENOUS

## 2024-02-05 MED ORDER — OXYCODONE HCL 5 MG/5ML PO SOLN: 5.0000 mg | Freq: Once | ORAL | Status: AC | PRN

## 2024-02-05 MED ORDER — SUCCINYLCHOLINE CHLORIDE 200 MG/10ML IV SOSY
PREFILLED_SYRINGE | INTRAVENOUS | Status: DC | PRN
  Administered 2024-02-05: 140 mg via INTRAVENOUS

## 2024-02-05 MED ORDER — APREPITANT 40 MG PO CAPS
40.0000 mg | ORAL_CAPSULE | ORAL | Status: AC
  Administered 2024-02-05: 40 mg via ORAL
  Filled 2024-02-05: qty 1

## 2024-02-05 MED ORDER — PHENYLEPHRINE 80 MCG/ML (10ML) SYRINGE FOR IV PUSH (FOR BLOOD PRESSURE SUPPORT)
PREFILLED_SYRINGE | INTRAVENOUS | Status: AC
  Filled 2024-02-05: qty 10

## 2024-02-05 MED ORDER — BUPIVACAINE-EPINEPHRINE 0.25% -1:200000 IJ SOLN
INTRAMUSCULAR | Status: DC | PRN
Start: 2024-02-05 — End: 2024-02-05
  Administered 2024-02-05: 60 mL

## 2024-02-05 MED ORDER — ROCURONIUM BROMIDE 10 MG/ML (PF) SYRINGE
PREFILLED_SYRINGE | INTRAVENOUS | Status: DC | PRN
  Administered 2024-02-05: 70 mg via INTRAVENOUS
  Administered 2024-02-05: 10 mg via INTRAVENOUS
  Administered 2024-02-05: 20 mg via INTRAVENOUS

## 2024-02-05 MED ORDER — ACETAMINOPHEN 160 MG/5ML PO SOLN: 1000.0000 mg | Freq: Three times a day (TID) | ORAL | Status: DC

## 2024-02-05 MED ORDER — ONDANSETRON HCL 4 MG/2ML IJ SOLN
INTRAMUSCULAR | Status: AC
  Filled 2024-02-05: qty 2

## 2024-02-05 MED ORDER — PHENYLEPHRINE HCL-NACL 20-0.9 MG/250ML-% IV SOLN
INTRAVENOUS | Status: DC | PRN
  Administered 2024-02-05: 35 ug/min via INTRAVENOUS

## 2024-02-05 MED ORDER — FENTANYL CITRATE PF 50 MCG/ML IJ SOSY
25.0000 ug | PREFILLED_SYRINGE | INTRAMUSCULAR | Status: DC | PRN
  Administered 2024-02-05: 50 ug via INTRAVENOUS

## 2024-02-05 MED ORDER — SIMETHICONE 80 MG PO CHEW
80.0000 mg | CHEWABLE_TABLET | Freq: Four times a day (QID) | ORAL | Status: DC | PRN
  Administered 2024-02-05: 80 mg via ORAL
  Filled 2024-02-05: qty 1

## 2024-02-05 MED ORDER — DEXTROSE-SODIUM CHLORIDE 5-0.45 % IV SOLN: INTRAVENOUS | Status: DC

## 2024-02-05 MED ORDER — HEPARIN SODIUM (PORCINE) 5000 UNIT/ML IJ SOLN
5000.0000 [IU] | INTRAMUSCULAR | Status: AC
  Administered 2024-02-05: 5000 [IU] via SUBCUTANEOUS
  Filled 2024-02-05: qty 1

## 2024-02-05 MED ORDER — MIDAZOLAM HCL 2 MG/2ML IJ SOLN
INTRAMUSCULAR | Status: AC
  Filled 2024-02-05: qty 2

## 2024-02-05 MED ORDER — FAMOTIDINE IN NACL 20-0.9 MG/50ML-% IV SOLN
20.0000 mg | Freq: Two times a day (BID) | INTRAVENOUS | Status: DC
  Administered 2024-02-05 – 2024-02-06 (×3): 20 mg via INTRAVENOUS
  Filled 2024-02-05 (×3): qty 50

## 2024-02-05 MED ORDER — LIDOCAINE 2% (20 MG/ML) 5 ML SYRINGE
INTRAMUSCULAR | Status: DC | PRN
  Administered 2024-02-05: 100 mg via INTRAVENOUS
  Administered 2024-02-05: 1.5 mg/kg/h via INTRAVENOUS

## 2024-02-05 MED ORDER — RISEDRONATE SODIUM 150 MG PO TABS: 150.0000 mg | ORAL_TABLET | Freq: Every day | ORAL | Status: DC

## 2024-02-05 MED ORDER — HYDRALAZINE HCL 20 MG/ML IJ SOLN
5.0000 mg | Freq: Once | INTRAMUSCULAR | Status: AC
  Administered 2024-02-05: 5 mg via INTRAVENOUS

## 2024-02-05 MED ORDER — ROCURONIUM BROMIDE 10 MG/ML (PF) SYRINGE
PREFILLED_SYRINGE | INTRAVENOUS | Status: AC
  Filled 2024-02-05: qty 10

## 2024-02-05 MED ORDER — CHLORHEXIDINE GLUCONATE 0.12 % MT SOLN
15.0000 mL | Freq: Once | OROMUCOSAL | Status: AC
  Administered 2024-02-05: 15 mL via OROMUCOSAL

## 2024-02-05 MED ORDER — KETAMINE HCL 50 MG/5ML IJ SOSY
PREFILLED_SYRINGE | INTRAMUSCULAR | Status: AC
Start: 2024-02-05 — End: 2024-02-05
  Filled 2024-02-05: qty 5

## 2024-02-05 MED ORDER — LACTATED RINGERS IV SOLN
INTRAVENOUS | Status: DC
Start: 2024-02-05 — End: 2024-02-05

## 2024-02-05 MED ORDER — CARVEDILOL 6.25 MG PO TABS
6.2500 mg | ORAL_TABLET | Freq: Two times a day (BID) | ORAL | Status: DC
  Administered 2024-02-05 – 2024-02-06 (×2): 6.25 mg via ORAL
  Filled 2024-02-05 (×2): qty 1

## 2024-02-05 SURGICAL SUPPLY — 45 items
BAG COUNTER SPONGE SURGICOUNT (BAG) IMPLANT
BAG LAPAROSCOPIC 12 15 PORT 16 (BASKET) ×1 IMPLANT
BENZOIN TINCTURE PRP APPL 2/3 (GAUZE/BANDAGES/DRESSINGS) ×1 IMPLANT
BLADE SURG SZ11 CARB STEEL (BLADE) ×1 IMPLANT
BNDG ADH 1X3 SHEER STRL LF (GAUZE/BANDAGES/DRESSINGS) ×6 IMPLANT
CABLE HIGH FREQUENCY MONO STRZ (ELECTRODE) IMPLANT
CHLORAPREP W/TINT 26 (MISCELLANEOUS) ×1 IMPLANT
CLIP APPLIE ROT 13.4 12 LRG (CLIP) IMPLANT
COVER SURGICAL LIGHT HANDLE (MISCELLANEOUS) ×1 IMPLANT
DRAPE UTILITY XL STRL (DRAPES) ×2 IMPLANT
ELECT REM PT RETURN 15FT ADLT (MISCELLANEOUS) ×1 IMPLANT
GAUZE 4X4 16PLY ~~LOC~~+RFID DBL (SPONGE) ×1 IMPLANT
GLOVE BIOGEL PI IND STRL 7.0 (GLOVE) ×1 IMPLANT
GLOVE SURG SS PI 7.0 STRL IVOR (GLOVE) ×1 IMPLANT
GOWN STRL REUS W/ TWL LRG LVL3 (GOWN DISPOSABLE) ×1 IMPLANT
GRASPER SUT TROCAR 14GX15 (MISCELLANEOUS) ×1 IMPLANT
IRRIGATION SUCT STRKRFLW 2 WTP (MISCELLANEOUS) ×1 IMPLANT
KIT BASIN OR (CUSTOM PROCEDURE TRAY) ×1 IMPLANT
KIT TURNOVER KIT A (KITS) ×1 IMPLANT
MARKER SKIN DUAL TIP RULER LAB (MISCELLANEOUS) IMPLANT
MAT PREVALON FULL STRYKER (MISCELLANEOUS) IMPLANT
NDL SPNL 22GX3.5 QUINCKE BK (NEEDLE) ×1 IMPLANT
NEEDLE SPNL 22GX3.5 QUINCKE BK (NEEDLE) ×1 IMPLANT
PACK UNIVERSAL I (CUSTOM PROCEDURE TRAY) ×1 IMPLANT
RELOAD STAPLE 60 3.6 BLU REG (STAPLE) IMPLANT
RELOAD STAPLE 60 3.8 GOLD REG (STAPLE) IMPLANT
RELOAD STAPLE 60 4.1 GRN THCK (STAPLE) IMPLANT
SCISSORS LAP 5X45 EPIX DISP (ENDOMECHANICALS) IMPLANT
SET TUBE SMOKE EVAC HIGH FLOW (TUBING) ×1 IMPLANT
SHEARS HARMONIC 45 ACE (MISCELLANEOUS) ×1 IMPLANT
SLEEVE GASTRECTOMY 40FR VISIGI (MISCELLANEOUS) ×1 IMPLANT
SLEEVE Z-THREAD 5X100MM (TROCAR) ×3 IMPLANT
SOLUTION ANTFG W/FOAM PAD STRL (MISCELLANEOUS) ×1 IMPLANT
SPIKE FLUID TRANSFER (MISCELLANEOUS) ×1 IMPLANT
STAPLER ECHELON LONG 3000 60 (ENDOMECHANICALS) ×1 IMPLANT
STRIP CLOSURE SKIN 1/2X4 (GAUZE/BANDAGES/DRESSINGS) ×1 IMPLANT
SUT ETHIBOND 0 36 GRN (SUTURE) IMPLANT
SUT MNCRL AB 4-0 PS2 18 (SUTURE) ×1 IMPLANT
SUT VICRYL 0 TIES 12 18 (SUTURE) ×1 IMPLANT
SYR 20ML LL LF (SYRINGE) ×1 IMPLANT
SYR 50ML LL SCALE MARK (SYRINGE) ×1 IMPLANT
SYSTEM KII OPTICAL ACCESS 15MM (TROCAR) ×1 IMPLANT
TOWEL OR 17X26 10 PK STRL BLUE (TOWEL DISPOSABLE) ×1 IMPLANT
TROCAR Z-THREAD OPTICAL 5X100M (TROCAR) ×1 IMPLANT
TUBING CONNECTING 10 (TUBING) ×2 IMPLANT

## 2024-02-05 NOTE — Op Note (Addendum)
 Preop Diagnosis: Obesity Class III  Postop Diagnosis: same  Procedure performed: laparoscopic Sleeve Gastrectomy, upper endoscopy  Assitant: Puja Maczis, PAC  Indications:  The patient is a 48 y.o. year-old morbidly obese male who has been followed in the Bariatric Clinic as an outpatient. This patient was diagnosed with morbid obesity with a BMI of Body mass index is 42.5 kg/m. and significant co-morbidities including GERD, congestive heart failure, and sleep apnea.  The patient was counseled extensively in the Bariatric Outpatient Clinic and after a thorough explanation of the risks and benefits of surgery (including death from complications, bowel leak, infection such as peritonitis and/or sepsis, internal hernia, bleeding, need for blood transfusion, bowel obstruction, organ failure, pulmonary embolus, deep venous thrombosis, wound infection, incisional hernia, skin breakdown, and others entailed on the consent form) and after a compliant diet and exercise program, the patient was scheduled for an elective laparoscopic sleeve gastrectomy.  Description of Operation:  Following informed consent, the patient was taken to the operating room and placed on the operating table in the supine position.  He had previously received prophylactic antibiotics and subcutaneous heparin  for DVT prophylaxis in the pre-op holding area.  After induction of general endotracheal anesthesia by the anesthesiologist, the patient underwent placement of sequential compression devices and an oro-gastric tube.  A timeout was confirmed by the surgery and anesthesia teams.  The patient was adequately padded at all pressure points and placed on a footboard to prevent slippage from the OR table during extremes of position during surgery.  He underwent a routine sterile prep and drape of her entire abdomen.    Next, A transverse incision was made under the left subcostal area and a 5mm optical viewing trocar was introduced into  the peritoneal cavity. Pneumoperitoneum was applied with a high flow and low pressure. A laparoscope was inserted to confirm placement. A extraperitoneal block was then placed at the lateral abdominal wall using exparel  diluted with marcaine . 5 additional incisions were placed: 1 5mm trocar to the left of the midline. 1 additional 5mm trocar in the left lateral area, 1 12mm trocar in the right mid abdomen, 1 5mm trocar in the right subcostal area, and a Nathanson retractor was placed through a subxiphoid incision.  The UGI showed a small hiatal hernia. Therefore, the pars flaccida was incised with harmonic scalpel. The stomach was reduced but on dissection of the posterior crus there was a visible hernia with small sac. The sac was dissected free and 1 0 ethibond sutures placed in interrupted fashion. A calibration tube was passed to ensure appropriate size of the hiatus.   Next, a hole was created through the lesser omentum along the greater curve of the stomach to enter the lesser sac. The vessels along the greater omentum were  Then ligated and divided using the Harmonic scalpel moving towards the spleen and then short gastric vessels were ligated and divided in the same fashion to fully mobilize the fundus. The left crus was identified to ensure completion of the dissection. Next the antrum was measured and dissection continued inferiorly along the greater curve towards the pylorus and stopped 6cm from the pylorus.   A 40Fr ViSiGi dilator was placed into the esophgaus and along the lesser curve of the stomach and placed on suction.  2 60mm Gold load echelon stapler(s) followed by 4 60mm blue load echelon stapler(s) were used to make the resection along the antrum being sure to stay well away from the angularis by angling the jaws of  the stapler towards the greater curve and later completing the resection staying along the ViSiGi and ensuring the fundus was not retained by appropriately retracting it  lateral. Air was inserted through the ViSiGi to perform a leak test showing no bubbles and a neutral lie of the stomach.  No bubbles were seen and the sleeve and antrum distended appropriately. The specimen was then placed in an endocatch bag and removed by the 15mm port.  An area near the spleen was evaluated an additional harmonic sealing was performed for hemostasis.   The endoscopy was placed in the mouth and into the oropharynx and under endoscopic vision it was advanced to the esophagogastric junction.  The stomach was insufflated and no bleeding or bubbles were seen.  The GEJ was identified at 41cm from the teeth. No bleeding or leaks were detected. The scope was withdrawn without difficulty.    The fascia of the 15mm port was closed with a 0 vicryl by suture passer. Pneumoperitoneum was evacuated, all ports were removed and all incisions closed with 4-0 monocryl suture in subcuticular fashion. Steristrips and bandaids were put in place for dressing. The patient awoke from anesthesia and was brought to pacu in stable condition. All counts were correct.  Estimated blood loss: <12ml  Specimens:  Sleeve gastrectomy  Local Anesthesia: 60 ml Marcaine  Post-Op Plan:       Pain Management: PO, prn      Antibiotics: Prophylactic      Anticoagulation: Prophylactic, Starting now      Post Op Studies/Consults: Not applicable      Intended Discharge: within 48h      Intended Outpatient Follow-Up: Two Week      Intended Outpatient Studies: Not Applicable      Other: Not Applicable   Herlene Righter Cliffard Hair

## 2024-02-05 NOTE — Progress Notes (Signed)
   02/05/24 2300  BiPAP/CPAP/SIPAP  $ Non-Invasive Home Ventilator  Initial  BiPAP/CPAP/SIPAP Pt Type Adult  BiPAP/CPAP/SIPAP Resmed  Mask Type Full face mask  EPAP 15 cmH2O  FiO2 (%) 21 %  Patient Home Machine No  Patient Home Mask Yes  Patient Home Tubing Yes  Auto Titrate No  Device Plugged into RED Power Outlet Yes  BiPAP/CPAP /SiPAP Vitals  Pulse Rate 71  Resp 18  SpO2 95 %  MEWS Score/Color  MEWS Score 0  MEWS Score Color Green   Patient placed on CPAP using his home mask and tubing.  Patient tolerating well at this time.

## 2024-02-05 NOTE — Transfer of Care (Signed)
 Immediate Anesthesia Transfer of Care Note  Patient: Alexander [name]  Procedure(s) Performed: GASTRECTOMY, SLEEVE, LAPAROSCOPIC ENDOSCOPY, UPPER GI TRACT REPAIR, HERNIA, HIATAL  Patient Location: PACU  Anesthesia Type:General  Level of Consciousness: awake, alert , and oriented  Airway & Oxygen Therapy: Patient Spontanous Breathing and Patient connected to face mask oxygen  Post-op Assessment: Report given to RN and Post -op Vital signs reviewed and stable  Post vital signs: Reviewed and stable  Last Vitals:  Vitals Value Taken Time  BP 154/91 02/05/24 09:29  Temp 36.1 C 02/05/24 09:29  Pulse 66 02/05/24 09:30  Resp 17 02/05/24 09:30  SpO2 100 % 02/05/24 09:30  Vitals shown include unfiled device data.  Last Pain:  Vitals:   02/05/24 0625  TempSrc:   PainSc: 0-No pain         Complications: No notable events documented.

## 2024-02-05 NOTE — Anesthesia Postprocedure Evaluation (Signed)
 Anesthesia Post Note  Patient: Alexander [name]  Procedure(s) Performed: GASTRECTOMY, SLEEVE, LAPAROSCOPIC ENDOSCOPY, UPPER GI TRACT REPAIR, HERNIA, HIATAL     Patient location during evaluation: PACU Anesthesia Type: General Level of consciousness: awake Pain management: pain level controlled Vital Signs Assessment: post-procedure vital signs reviewed and stable Respiratory status: spontaneous breathing Cardiovascular status: blood pressure returned to baseline Postop Assessment: no apparent nausea or vomiting and adequate PO intake Anesthetic complications: no   No notable events documented.  Last Vitals:  Vitals:   02/05/24 1024 02/05/24 1042  BP: (!) 142/99 (!) 149/99  Pulse: (!) 50 (!) 55  Resp: 19 18  Temp: (!) 36.2 C (!) 36.3 C  SpO2: 97% 98%    Last Pain:  Vitals:   02/05/24 1046  TempSrc:   PainSc: 4                  Lauraine KATHEE [place]

## 2024-02-05 NOTE — Anesthesia Procedure Notes (Signed)
 Procedure Name: Intubation Date/Time: 02/05/2024 7:36 AM  Performed by: Zulema Leita PARAS, CRNAPre-anesthesia Checklist: Patient identified, Emergency Drugs available, Suction available and Patient being monitored Patient Re-evaluated:Patient Re-evaluated prior to induction Oxygen Delivery Method: Circle system utilized Preoxygenation: Pre-oxygenation with 100% oxygen Induction Type: IV induction Ventilation: Mask ventilation without difficulty Laryngoscope Size: Mac and 4 Grade View: Grade II Tube type: Oral Tube size: 7.5 mm Number of attempts: 1 Airway Equipment and Method: Stylet Placement Confirmation: ETT inserted through vocal cords under direct vision, positive ETCO2 and breath sounds checked- equal and bilateral Secured at: 22 cm Tube secured with: Tape Dental Injury: Teeth and Oropharynx as per pre-operative assessment

## 2024-02-05 NOTE — Progress Notes (Signed)
 Discussed QI Goals for Discharge document with patient including ambulation in halls, Incentive Spirometry use every hour, and oral care.  Also discussed pain and nausea control.  BSTOP education provided including BSTOP information guide, Guide for Pain Management after your Bariatric Procedure.  Diet progression education provided including Bariatric Surgery Post-Op Food Plan Phase 1: Liquids.  Questions answered.  Will continue to partner with bedside RN and follow up with patient per protocol after arrival to the floor.

## 2024-02-05 NOTE — H&P (Signed)
 Chief Complaint  Patient presents with  Bariatric Pre-op Exam   Subjective   Alexander [name] is a 48 y.o. male established patient in today for: History of Present Illness Alexander [name] is a 48 year old male who presents for a pre-operative consultation for a sleeve gastrectomy.  He is scheduled for a sleeve gastrectomy on February 05, 2024, and follows a liver shrinking diet that is low-carb and high-protein. He experiences low energy and headaches initially due to carbohydrate restriction.  Atrial fibrillation has been stable for over three years. He was on Xarelto  until July 2025, when insurance declined renewal. He does not use Lasix  regularly and has not used furosemide  in months.  He uses a CPAP machine for sleep apnea, ensuring good sleep quality, and plans to bring it for post-operative use. He experiences occasional heartburn without severe symptoms.  There is no problem list on file for this patient.  Outpatient Medications Prior to Visit  Medication Sig Dispense Refill  allopurinoL  (ZYLOPRIM ) 100 MG tablet Take 100 mg by mouth once daily  buPROPion  (WELLBUTRIN  XL) 300 MG XL tablet Take 300 mg by mouth every morning  carvediloL  (COREG ) 6.25 MG tablet Take 1 tablet by mouth 2 (two) times daily with meals  clopidogreL  (PLAVIX ) 75 mg tablet Take 75 mg by mouth once daily (Patient not taking: Reported on 01/25/2024)  colchicine (COLCRYS) 0.6 mg tablet 1 tablet as needed for flares Oral twice a day for 30 days  doxazosin  (CARDURA ) 2 MG tablet Take 2 mg by mouth  ENTRESTO  97-103 mg tablet Take 1 tablet by mouth 2 (two) times daily  FUROsemide  (LASIX ) 20 MG tablet TAKE 1 TABLET BY MOUTH AS NEEDED FOR FLUID OR EDEMA  isosorbide  dinitrate (ISORDIL ) 30 MG tablet  KLOR-CON  M20 20 mEq ER tablet TAKE 1 TABLET (20 MEQ TOTAL) BY MOUTH AS NEEDED (WITH LASIX ).  nitroGLYcerin  (NITROSTAT ) 0.4 MG SL tablet  rosuvastatin  (CRESTOR ) 40 MG tablet Take 1 tablet by mouth once daily  spironolactone   (ALDACTONE ) 25 MG tablet Take 25 mg by mouth once daily  traMADoL  (ULTRAM ) 50 mg tablet Take 50 mg by mouth every 8 (eight) hours as needed  XARELTO  20 mg tablet Take 20 mg by mouth once daily   No facility-administered medications prior to visit.    Objective   Vitals:  01/25/24 1431  BP: 130/84  Pulse: 85  Temp: 36.8 C (98.2 F)  SpO2: 96%  Weight: (!) 144.5 kg (318 lb 9.6 oz)  Height: 182.9 cm (6')  PainSc: 0-No pain   Body mass index is 43.21 kg/m. Physical Exam Constitutional:  Appearance: Normal appearance.  HENT:  Head: Normocephalic and atraumatic.  Pulmonary:  Effort: Pulmonary effort is normal.  Musculoskeletal:  General: Normal range of motion.  Cervical back: Normal range of motion.  Neurological:  General: No focal deficit present.  Mental Status: He is alert and oriented to person, place, and time. Mental status is at baseline.  Psychiatric:  Mood and Affect: Mood normal.  Behavior: Behavior normal.  Thought Content: Thought content normal.     I reviewed notes by Karleen Daring and UGI showing small hiatal hernia  Assessment/Plan:   Assessment & Plan Morbid obesity due to excess calories Scheduled for laparoscopic sleeve gastrectomy on February 05, 2024. Discussed procedure, risks, post-op care, and AFib management. - Continue liver shrinking diet pre-operatively. - Attend pre-op appointment on January 29, 2024. - Hold Xarelto  48-72 hours post-op, restart based on clinical assessment. - Use CPAP post-op, continue after weight loss. -  Monitor for dehydration, consider fluid clinic if necessary. - Check vitamin levels at two months and annually post-op. - Use bariatric-specific multivitamins as provided by the study. - Avoid heavy lifting for three weeks post-op, then gradually increase. - Encourage walking and light activity post-op.  Hiatal hernia Identified on upper GI study. Plan for concurrent repair during sleeve gastrectomy. - Repair  hiatal hernia during sleeve gastrectomy. Diagnoses and all orders for this visit:  Morbid (severe) obesity due to excess calories (CMS/HHS-HCC)  CHF (congestive heart failure), NYHA class I, chronic, systolic (CMS/HHS-HCC)  Paroxysmal atrial fibrillation (CMS/HHS-HCC)  OSA (obstructive sleep apnea)

## 2024-02-05 NOTE — Plan of Care (Signed)

## 2024-02-06 ENCOUNTER — Encounter (HOSPITAL_COMMUNITY): Payer: Self-pay | Admitting: General Surgery

## 2024-02-06 DIAGNOSIS — E66813 Obesity, class 3: Secondary | ICD-10-CM | POA: Diagnosis not present

## 2024-02-06 LAB — CBC WITH DIFFERENTIAL/PLATELET
Abs Immature Granulocytes: 0.06 K/uL (ref 0.00–0.07)
Basophils Absolute: 0 K/uL (ref 0.0–0.1)
Basophils Relative: 0 %
Eosinophils Absolute: 0 K/uL (ref 0.0–0.5)
Eosinophils Relative: 0 %
HCT: 46.3 % (ref 39.0–52.0)
Hemoglobin: 15.2 g/dL (ref 13.0–17.0)
Immature Granulocytes: 0 %
Lymphocytes Relative: 9 %
Lymphs Abs: 1.3 K/uL (ref 0.7–4.0)
MCH: 28.8 pg (ref 26.0–34.0)
MCHC: 32.8 g/dL (ref 30.0–36.0)
MCV: 87.9 fL (ref 80.0–100.0)
Monocytes Absolute: 1.1 K/uL — ABNORMAL HIGH (ref 0.1–1.0)
Monocytes Relative: 8 %
Neutro Abs: 11.8 K/uL — ABNORMAL HIGH (ref 1.7–7.7)
Neutrophils Relative %: 83 %
Platelets: 233 K/uL (ref 150–400)
RBC: 5.27 MIL/uL (ref 4.22–5.81)
RDW: 12 % (ref 11.5–15.5)
WBC: 14.3 K/uL — ABNORMAL HIGH (ref 4.0–10.5)
nRBC: 0 % (ref 0.0–0.2)

## 2024-02-06 LAB — GLUCOSE, CAPILLARY
Glucose-Capillary: 139 mg/dL — ABNORMAL HIGH (ref 70–99)
Glucose-Capillary: 173 mg/dL — ABNORMAL HIGH (ref 70–99)
Glucose-Capillary: 186 mg/dL — ABNORMAL HIGH (ref 70–99)

## 2024-02-06 MED ORDER — ONDANSETRON 4 MG PO TBDP: 4.0000 mg | ORAL_TABLET | Freq: Four times a day (QID) | ORAL | 0 refills | Status: DC | PRN

## 2024-02-06 MED ORDER — GABAPENTIN 100 MG PO CAPS: 100.0000 mg | ORAL_CAPSULE | Freq: Two times a day (BID) | ORAL | 0 refills | Status: DC

## 2024-02-06 MED ORDER — PANTOPRAZOLE SODIUM 40 MG PO TBEC: 40.0000 mg | DELAYED_RELEASE_TABLET | Freq: Every day | ORAL | 0 refills | Status: DC

## 2024-02-06 NOTE — Plan of Care (Signed)

## 2024-02-06 NOTE — Inpatient Diabetes Management (Addendum)
 Inpatient Diabetes Program Recommendations  AACE/ADA: New Consensus Statement on Inpatient Glycemic Control (2015)  Target Ranges:  Prepandial:   less than 140 mg/dL      Peak postprandial:   less than 180 mg/dL (1-2 hours)      Critically ill patients:  140 - 180 mg/dL   Lab Results  Component Value Date   GLUCAP 139 (H) 02/06/2024   HGBA1C 6.0 (H) 02/05/2024    Review of Glycemic Control  Diabetes history: DM2 Outpatient Diabetes medications: Farxiga  10 daily Current orders for Inpatient glycemic control: Novolog 0-15 Q4H  HgbA1C - 6.0% Excellent glycemic control  Inpatient Diabetes Program Recommendations:    Discharge Recs:  Farxiga  10 mg daily  F/U with PCP regarding diabetes management  Thank you. Shona Brandy, RD, LDN, CDCES Inpatient Diabetes Coordinator [phone]  Addendum: Spoke with pt on phone regarding his prediabetes diagnosis with Hgb1C of 6%. Pt to f/u with PCP. Has no hx hypoglycemia. Recommended purchasing a glucose meter at [facility] and checking blood sugar a couple of times a week and take meter to PCP for review. Pt agreeable and looking forward to reducing HgbA1C to < 6%. Answered all questions. RV

## 2024-02-06 NOTE — Discharge Summary (Signed)
 Physician Discharge Summary  Alexander [name] FMW:[id number] DOB: Jun 09, 1975 DOA: 02/05/2024  PCP: Santo Domino, MD  Admit date: 02/05/2024 Discharge date:  02/06/2024   Recommendations for Outpatient Follow-up:   (include homehealth, outpatient follow-up instructions, specific recommendations for PCP to follow-up on, etc.)   Follow-up Information     Almena Hokenson, Herlene Righter, MD. Go on 03/01/2024.   Specialty: General Surgery Why: @ 11 am Contact information: [address] [place] [address] [phone]         Maczis, Tonja [place], PA-C. Go on 04/02/2024.   Specialty: General Surgery Why: @ 215 pm Contact information: [address] [facility] [place] [address] [phone]                Discharge Diagnoses:  Principal Problem:   Morbid (severe) obesity due to excess calories [facility])   Surgical Procedure: laparoscopic sleeve gastrectomy, upper endoscopy  Discharge Condition: Good Disposition: Home  Diet recommendation: Postoperative sleeve gastrectomy diet (liquids only)  Filed Weights   02/05/24 0610 02/05/24 0625  Weight: (!) 142.2 kg (!) 142.2 kg     Hospital Course:  The patient was admitted after undergoing laparoscopic sleeve gastrectomy. POD 0 he ambulated well. POD 1 he was started on the water diet protocol and tolerated 150 ml in the first shift. Once meeting the water amount he was advanced to bariatric protein shakes which they tolerated and were discharged home POD 1.  Treatments: surgery: laparoscopic sleeve gastrectomy  Discharge Instructions  Discharge Instructions     Ambulate hourly while awake   Complete by: As directed    Call MD for:  difficulty breathing, headache or visual disturbances   Complete by: As directed    Call MD for:  persistant dizziness or light-headedness   Complete by: As directed    Call MD for:  persistant nausea and vomiting   Complete by: As directed     Call MD for:  redness, tenderness, or signs of infection (pain, swelling, redness, odor or green/yellow discharge around incision site)   Complete by: As directed    Call MD for:  severe uncontrolled pain   Complete by: As directed    Call MD for:  temperature >101 F   Complete by: As directed    Diet bariatric full liquid   Complete by: As directed    Discharge wound care:   Complete by: As directed    Remove Bandaids tomorrow, ok to shower tomorrow. Steristrips may fall off in 1-3 weeks.   Incentive spirometry   Complete by: As directed    Perform hourly while awake      Allergies as of 02/06/2024       Reactions   Albuterol Anaphylaxis   Bee Venom Anaphylaxis   Metaproterenol Anaphylaxis   Other Anaphylaxis   DEET   Amoxicillin-pot Clavulanate Itching   OK with Amoxicillin   Cephalexin Other (See Comments)   Chest tightness   Amoxicillin Itching, Rash   Itching all over         Medication List     STOP taking these medications    furosemide  20 MG tablet Commonly known as: LASIX    loperamide  2 MG capsule Commonly known as: IMODIUM    potassium chloride  SA 20 MEQ tablet Commonly known as: KLOR-CON  M   spironolactone  25 MG tablet Commonly known as: ALDACTONE    traMADol  50 MG tablet Commonly known as: ULTRAM        TAKE these medications  acetaminophen  500 MG tablet Commonly known as: TYLENOL  Take 500 mg by mouth every 6 (six) hours as needed for moderate pain.   allopurinol  100 MG tablet Commonly known as: ZYLOPRIM  Take 100 mg by mouth daily.   buPROPion  150 MG 12 hr tablet Commonly known as: WELLBUTRIN  SR Take 150 mg by mouth 2 (two) times daily.   carvedilol  6.25 MG tablet Commonly known as: COREG  TAKE 1 TABLET BY MOUTH 2 TIMES DAILY WITH A MEAL.   cetirizine 10 MG tablet Commonly known as: ZYRTEC Take 10 mg by mouth daily.   colchicine 0.6 MG tablet Take 0.6 mg by mouth 2 (two) times daily as needed (gout).   dapagliflozin   propanediol 10 MG Tabs tablet Commonly known as: FARXIGA  TAKE 1 TABLET BY MOUTH EVERY DAY   doxazosin  2 MG tablet Commonly known as: CARDURA  TAKE 1 TABLET BY MOUTH AT BEDTIME   Entresto  97-103 MG Generic drug: sacubitril -valsartan  TAKE 1 TABLET BY MOUTH TWICE A DAY   gabapentin 100 MG capsule Commonly known as: NEURONTIN Take 1 capsule (100 mg total) by mouth every 12 (twelve) hours for 5 days.   isosorbide  mononitrate 30 MG 24 hr tablet Commonly known as: IMDUR  Take 1 tablet (30 mg total) by mouth daily.   multivitamin with minerals Tabs tablet Take 1 tablet by mouth daily.   nitroGLYCERIN  0.4 MG SL tablet Commonly known as: Nitrostat  Place 1 tablet (0.4 mg total) under the tongue every 5 (five) minutes as needed for chest pain.   ondansetron  4 MG disintegrating tablet Commonly known as: ZOFRAN -ODT Take 1 tablet (4 mg total) by mouth every 6 (six) hours as needed for nausea or vomiting.   pantoprazole 40 MG tablet Commonly known as: PROTONIX Take 1 tablet (40 mg total) by mouth daily.   risedronate 150 MG tablet Commonly known as: ACTONEL Take 150 mg by mouth daily.   rosuvastatin  40 MG tablet Commonly known as: CRESTOR  TAKE 1 TABLET BY MOUTH EVERY DAY   tiZANidine  4 MG tablet Commonly known as: Zanaflex  Take 1 tablet (4 mg total) by mouth at bedtime as needed for muscle spasms.   Xarelto  20 MG Tabs tablet Generic drug: rivaroxaban  TAKE 1 TABLET BY MOUTH EVERY DAY               Discharge Care Instructions  (From admission, onward)           Start     Ordered   02/06/24 0000  Discharge wound care:       Comments: Remove Bandaids tomorrow, ok to shower tomorrow. Steristrips may fall off in 1-3 weeks.   02/06/24 0759            Follow-up Information     Jearlene Bridwell, Herlene Righter, MD. Go on 03/01/2024.   Specialty: General Surgery Why: @ 11 am Contact information: [address] [place] [address] [phone]          Maczis, Tonja [place], PA-C. Go on 04/02/2024.   Specialty: General Surgery Why: @ 215 pm Contact information: [address] [facility] [place] [address] [phone]                  The results of significant diagnostics from this hospitalization (including imaging, microbiology, ancillary and laboratory) are listed below for reference.    Significant Diagnostic Studies: No results found.  Labs: Basic Metabolic Panel: Recent Labs  Lab 01/31/24 0841 02/05/24 1357  NA 138  --  K 4.0  --   CL 102  --   CO2 24  --   GLUCOSE 137*  --   BUN 18  --   CREATININE 0.86 0.89  CALCIUM  9.5  --    Liver Function Tests: Recent Labs  Lab 01/31/24 0841  AST 31  ALT 65*  ALKPHOS 66  BILITOT 0.6  PROT 6.8  ALBUMIN 4.5    CBC: Recent Labs  Lab 01/31/24 0841 02/05/24 1254 02/06/24 0505  WBC 7.3 13.5* 14.3*  NEUTROABS 4.3  --  11.8*  HGB 16.2 15.5 15.2  HCT 50.6 46.3 46.3  MCV 88.2 88.0 87.9  PLT 232 198 233    CBG: Recent Labs  Lab 02/05/24 1156 02/05/24 1709 02/05/24 2026 02/06/24 0015 02/06/24 0427  GLUCAP 180* 200* 200* 173* 186*    Principal Problem:   Morbid (severe) obesity due to excess calories [facility])   VTE plan: He will restart his DOAC ([email or web address])  Time coordinating discharge: 15 min

## 2024-02-06 NOTE — Progress Notes (Signed)
 Patient alert and oriented, pain is controlled. Patient is tolerating fluids, advanced to protein shake today, patient is tolerating well. Reviewed AVS which includes Gastric sleeve/bypass discharge instructions with patient and patient is able to articulate understanding. Provided information on BELT program, Support Group, BSTOP-D, and [facility] outpatient pharmacy. Communicated general update of patient status to surgeon.  24hr fluid recall is 1430 per hydration protocol, bariatric nurse coordinator to make follow-up phone call within one week.

## 2024-02-07 ENCOUNTER — Other Ambulatory Visit (HOSPITAL_COMMUNITY): Payer: Self-pay | Admitting: Internal Medicine

## 2024-02-11 ENCOUNTER — Telehealth (HOSPITAL_COMMUNITY): Payer: Self-pay

## 2024-02-11 NOTE — Telephone Encounter (Signed)
 1. Tell me about your pain and pain management?          2. Let's talk about fluid intake. How much total fluid are you taking in?   Pt states that s/he is getting in at least ___oz of fluid including protein shakes, bottled water, and ___      3. How much protein have you taken in the last day?     Pt states he is meeting his goal of 80g of protein each day with the protein shakes and ______     4. Have you had nausea? Tell me about when you have experienced nausea and what you did to help?     5. Has the frequency or color changed with your urine?     6. Tell me what your incisions look like?       7. Have you been passing gas? BM?     8. If a problem or question were to arise who would you call? Do you know contact numbers for [facility], [facility], and [facility]?     9. How has the walking going?     10. Are you still using your incentive spirometer? If so, how often?   Pt states that s/he is doing the I.S. Pt encouraged to use incentive spirometer, at least 10x every hour while awake until s/he sees the surgeon.   11. How are your vitamins and calcium  going? How are you taking them?          SABRA

## 2024-02-13 ENCOUNTER — Telehealth (HOSPITAL_COMMUNITY): Payer: Self-pay

## 2024-02-14 LAB — SURGICAL PATHOLOGY

## 2024-02-20 ENCOUNTER — Encounter: Payer: Self-pay | Admitting: Dietician

## 2024-02-20 ENCOUNTER — Encounter: Attending: General Surgery | Admitting: Dietician

## 2024-02-20 VITALS — Ht 73.0 in | Wt 289.9 lb

## 2024-02-20 DIAGNOSIS — E669 Obesity, unspecified: Secondary | ICD-10-CM | POA: Diagnosis present

## 2024-02-20 NOTE — Progress Notes (Signed)
 2 Week Post-Operative Nutrition Class   Patient was seen on 02/20/2024 for Post-Operative Nutrition education at the [facility].    Surgery date: 02/05/2024 Surgery type: Sleeve Gastrectomy  Anthropometrics  Start weight at [facility]: 329.2 lbs (date: 07/06/2023) Height: 73 in Weight today: 289.9 lbs  Clinical  Medical hx: hypercholesterolemia, HTN, sleep apnea, obesity Medications: see EMR Labs:  Notable signs/symptoms: none noted Any previous deficiencies? No Bowel Habits: Every day to every other day no complaints   Body Composition Scale 02/20/2024  Current Body Weight 289.9  Total Body Fat % 33.3  Visceral Fat   Fat-Free Mass % 66.6   Total Body Water % 47.6  Muscle-Mass lbs   BMI 38.1  Body Fat Displacement          Torso  lbs          Left Leg  lbs          Right Leg  lbs          Left Arm  lbs          Right Arm  lbs       The following the learning objectives were met by the patient during this course: Identifies Soft Prepped Plan Advancement Guide  Identifies Soft, High Proteins (Phase 1), beginning 2 weeks post-operatively to 3 weeks post-operatively Identifies Additional Soft High Proteins, soft non-starchy vegetables, fruits and starches (Phase 2), beginning 3 weeks post-operatively to 3 months post-operatively Identifies appropriate sources of fluids, proteins, vegetables, fruits and starches Identifies appropriate fat sources and healthy verses unhealthy fat types   States protein, vegetable, fruit and starch recommendations and appropriate sources post-operatively Identifies the need for appropriate texture modifications, mastication, and bite sizes when consuming solids Identifies appropriate fat consumption and sources Identifies appropriate multivitamin and calcium  sources post-operatively Describes the need for physical activity post-operatively and will follow MD recommendations States when to call healthcare provider  regarding medication questions or post-operative complications   Handouts given during class include: Soft Prepped Plan Advancement Guide   Follow-Up Plan: Patient will follow-up at [facility] in 10 weeks for 3 month post-op nutrition visit for diet advancement per MD.

## 2024-02-29 ENCOUNTER — Telehealth: Payer: Self-pay | Admitting: Dietician

## 2024-02-29 NOTE — Telephone Encounter (Signed)
 RD called pt to verify fluid intake once starting soft, solid proteins 2 week post-bariatric surgery.   Daily Fluid intake: 64 oz Daily Protein intake: 95-100 grams Bowel Habits: some constipation, started using Miralax   Concerns/issues: no other issues or concerns. Pt states he sees his surgeon tomorrow.

## 2024-03-04 ENCOUNTER — Ambulatory Visit: Admitting: Orthopedic Surgery

## 2024-03-05 ENCOUNTER — Other Ambulatory Visit: Payer: Self-pay

## 2024-03-05 ENCOUNTER — Ambulatory Visit (INDEPENDENT_AMBULATORY_CARE_PROVIDER_SITE_OTHER): Admitting: Orthopedic Surgery

## 2024-03-05 DIAGNOSIS — M79672 Pain in left foot: Secondary | ICD-10-CM | POA: Diagnosis not present

## 2024-03-05 DIAGNOSIS — M2022 Hallux rigidus, left foot: Secondary | ICD-10-CM

## 2024-03-05 DIAGNOSIS — M6702 Short Achilles tendon (acquired), left ankle: Secondary | ICD-10-CM | POA: Diagnosis not present

## 2024-03-05 DIAGNOSIS — Q661 Congenital talipes calcaneovarus, unspecified foot: Secondary | ICD-10-CM

## 2024-03-06 ENCOUNTER — Encounter: Payer: Self-pay | Admitting: Orthopedic Surgery

## 2024-03-06 NOTE — Progress Notes (Signed)
 Office Visit Note   Patient: Alexander [name]           Date of Birth: [birth year]           MRN: [id number] Visit Date: 03/05/2024              Requested by: Santo Domino, MD No address on file PCP: Santo Domino, MD  Chief Complaint  Patient presents with   Left Foot - Pain      HPI: Discussed the use of AI scribe software for clinical note transcription with the patient, who gave verbal consent to proceed.  History of Present Illness Alexander [name] is a 48 year old male with a history of gout who presents with left foot pain.  He experiences pain beneath the fifth metatarsal head of the left foot, described as tender and aggravated by walking or bumping the area. New shoes exacerbate the discomfort, making him feel pushed outward and causing concern about potential ankle rolling due to imbalance.  He has a history of lateral ankle rolling on the left side, which is more severe than on the right. Additionally, he has developed a callus beneath the fifth metatarsal head, likening it to 'a rock in the bottom of your shoe.'  He is one month status post gastric bypass surgery and has a history of gout, for which he takes allopurinol  daily.  He mentions experiencing tendinitis in the back of his leg over the past few months.     Assessment & Plan: Visit Diagnoses:  1. Pain in left foot     Plan: Assessment and Plan Assessment & Plan Left foot pain with callus and hallux rigidus Chronic pain beneath the fifth metatarsal head due to anatomical factors including cavus hindfoot, plantar flexed first ray, and varus calcaneus. Hallux rigidus with limited dorsiflexion. No gout signs in the great toe MTP joint. - Trim callus beneath the fifth metatarsal head. - Modify orthotic inserts to unload lateral foot. - Recommend sole orthotics with same modification. - Instruct on Achilles stretching exercises.  Left Achilles tendon contracture Achilles tendon contracture with  dorsiflexion just short of neutral, contributing to lateral foot pain and increased pressure. - Instruct on Achilles stretching exercises three times daily, holding each stretch for one minute.      Follow-Up Instructions: No follow-ups on file.   Ortho Exam  Patient is alert, oriented, no adenopathy, well-dressed, normal affect, normal respiratory effort. Physical Exam EXTREMITIES: Pain beneath the fifth metatarsal head of the left foot. Good dorsalis pedis pulse. Varus calcaneus with plantar flexed first ray and hallux rigidus. Great toe dorsiflexion limited to 45 degrees. Weight placed laterally with Kevovera's foot. Callus beneath the fifth metatarsal head. Achilles contraction with dorsiflexion just short of neutral. No clinical signs of gout in great toe MTP joint.      Imaging: No results found. No images are attached to the encounter.  Labs: Lab Results  Component Value Date   HGBA1C 6.0 (H) 02/05/2024   HGBA1C 5.6 07/13/2022   HGBA1C 5.5 03/22/2022   REPTSTATUS 12/09/2023 FINAL 12/08/2023   CULT  12/08/2023    NO GROWTH Performed at [facility], [address], [place], [place] [address]      Lab Results  Component Value Date   ALBUMIN 4.5 01/31/2024   ALBUMIN 4.3 04/12/2023   ALBUMIN 4.5 09/06/2022    Lab Results  Component Value Date   MG 2.1 05/15/2020   MG 1.9 05/14/2020   MG 1.9 04/26/2020  No results found for: VD25OH  No results found for: PREALBUMIN    Latest Ref Rng & Units 02/06/2024    5:05 AM 02/05/2024   12:54 PM 01/31/2024    8:41 AM  CBC EXTENDED  WBC 4.0 - 10.5 K/uL 14.3  13.5  7.3   RBC 4.22 - 5.81 MIL/uL 5.27  5.26  5.74   Hemoglobin 13.0 - 17.0 g/dL 84.7  84.4  83.7   HCT 39.0 - 52.0 % 46.3  46.3  50.6   Platelets 150 - 400 K/uL 233  198  232   NEUT# 1.7 - 7.7 K/uL 11.8   4.3   Lymph# 0.7 - 4.0 K/uL 1.3   2.1      There is no height or weight on file to calculate BMI.  Orders:  Orders Placed This Encounter   Procedures   XR Foot 2 Views Left   No orders of the defined types were placed in this encounter.    Procedures: No procedures performed  Clinical Data: No additional findings.  ROS:  All other systems negative, except as noted in the HPI. Review of Systems  Objective: Vital Signs: There were no vitals taken for this visit.  Specialty Comments:  MRI lumbar spine:  TECHNIQUE: Sagittal and axial T1 and T2-weighted sequences were performed. Additional sagittal STIR images were performed.  INDICATION: Back pain  COMPARISON: None  FINDINGS: #  Lumbar alignment is preserved. #  Vertebral body heights are well maintained. #  The marrow signal intensity is normal. #  Conus terminates at L1 without evidence of tethering. #  Nerve roots appear normal. #  Incidental findings: None.   #  L1-2: Normal. #   #  L2-3: Disc desiccation. Central canal is well-maintained. Neural foramina patent #   #  L3-4: Mild degenerative disc disease. Disc osteophyte causes mild left neural foraminal narrowing. No significant central canal stenosis. #   #  L4-5: Desiccation. Central canal is well-maintained. Disc osteophyte causes mild bilateral neural foraminal narrowing. No central central canal stenosis. #   #  L5-S1: Normal.   IMPRESSION:  Mild lumbar degenerative disc disease with mild neural foraminal narrowing greatest on the left at L3-4 and L4-5. No significant central canal stenosis.  Electronically Signed by: Dallas Jubilee Exam End: 04/12/19 14:22  PMFS History: Patient Active Problem List   Diagnosis Date Noted   Morbid (severe) obesity due to excess calories (HCC) 02/05/2024   Dysplastic nevi 07/12/2023   Paroxysmal atrial fibrillation (HCC) 09/19/2022   Unstable angina (HCC) 07/12/2022   Morbid obesity (HCC) 06/17/2021   Chronic systolic heart failure (HCC)    Coronary artery disease involving native coronary artery of native heart with unstable angina pectoris (HCC)     Acute systolic heart failure (HCC) 05/13/2020   Acute on chronic systolic (congestive) heart failure (HCC) 05/13/2020   Atrial fibrillation with RVR (HCC) 04/26/2020   HLD (hyperlipidemia) 04/26/2020   HTN (hypertension) 04/26/2020   Depression 04/26/2020   Gout 04/26/2020   Panic attacks 04/26/2020   Low back pain 04/26/2020   Past Medical History:  Diagnosis Date   Anxiety    Arrhythmia    Atrial fibrillation (HCC)    CAD (coronary artery disease) 08/17/2020   PCI + DES to pLCx, 70% mLAD negative FFR testing (medical therapy)    CHF (congestive heart failure) (HCC)    Chronic systolic heart failure (HCC)    Depression    Dysplastic nevi 07/12/2023   Severe atypia - right  paraspinal superior - Ex needed Moderate - right paraspinal inferior - Ex needed Severe atypia - mid back - Ex needed     Gout    Hyperlipidemia    Hypertension    OSA (obstructive sleep apnea) 09/2020   cpap   Pre-diabetes     Family History  Problem Relation Age of Onset   COPD Mother     Past Surgical History:  Procedure Laterality Date   CARDIAC CATHETERIZATION     CARDIOVERSION N/A 05/15/2020   Procedure: CARDIOVERSION;  Surgeon: Cherrie Toribio SAUNDERS, MD;  Location: [facility] ENDOSCOPY;  Service: Cardiovascular;  Laterality: N/A;   CORONARY IMAGING/OCT N/A 07/13/2022   Procedure: INTRAVASCULAR IMAGING/OCT;  Surgeon: Anner Alm ORN, MD;  Location: [facility] INVASIVE CV LAB;  Service: Cardiovascular;  Laterality: N/A;   CORONARY STENT INTERVENTION N/A 08/17/2020   Procedure: CORONARY STENT INTERVENTION;  Surgeon: Verlin Lonni BIRCH, MD;  Location: [facility] INVASIVE CV LAB;  Service: Cardiovascular;  Laterality: N/A;   CORONARY STENT INTERVENTION N/A 07/13/2022   Procedure: CORONARY STENT INTERVENTION;  Surgeon: Anner Alm ORN, MD;  Location: [facility] INVASIVE CV LAB;  Service: Cardiovascular;  Laterality: N/A;   HIATAL HERNIA REPAIR N/A 02/05/2024   Procedure: REPAIR, HERNIA, HIATAL;  Surgeon: Kinsinger, Herlene Righter, MD;  Location: [facility] ORS;  Service: General;  Laterality: N/A;   LAPAROSCOPIC GASTRIC SLEEVE RESECTION N/A 02/05/2024   Procedure: GASTRECTOMY, SLEEVE, LAPAROSCOPIC;  Surgeon: Stevie Herlene Righter, MD;  Location: [facility] ORS;  Service: General;  Laterality: N/A;   LEFT HEART CATH AND CORONARY ANGIOGRAPHY N/A 07/13/2022   Procedure: LEFT HEART CATH AND CORONARY ANGIOGRAPHY;  Surgeon: Anner Alm ORN, MD;  Location: [facility] INVASIVE CV LAB;  Service: Cardiovascular;  Laterality: N/A;   left shoulder scope     MOLE REMOVAL  07/27/2023   RIGHT/LEFT HEART CATH AND CORONARY ANGIOGRAPHY N/A 08/17/2020   Procedure: RIGHT/LEFT HEART CATH AND CORONARY ANGIOGRAPHY;  Surgeon: Cherrie Toribio SAUNDERS, MD;  Location: [facility] INVASIVE CV LAB;  Service: Cardiovascular;  Laterality: N/A;   SEPTOPLASTY     UPPER GI ENDOSCOPY N/A 02/05/2024   Procedure: ENDOSCOPY, UPPER GI TRACT;  Surgeon: Kinsinger, Herlene Righter, MD;  Location: [facility] ORS;  Service: General;  Laterality: N/A;   Social History   Occupational History   Not on file  Tobacco Use   Smoking status: Former    Types: Cigars    Quit date: 08/14/2020    Years since quitting: 3.5   Smokeless tobacco: Never  Vaping Use   Vaping status: Never Used  Substance and Sexual Activity   Alcohol use: Yes    Alcohol/week: 1.0 standard drink of alcohol    Types: 1 Cans of beer per week    Comment: drinks beer socially 1 per week   Drug use: Never   Sexual activity: Not on file

## 2024-03-11 ENCOUNTER — Encounter: Payer: Self-pay | Admitting: Radiology

## 2024-03-20 ENCOUNTER — Encounter (HOSPITAL_COMMUNITY): Payer: Self-pay | Admitting: Emergency Medicine

## 2024-03-20 ENCOUNTER — Ambulatory Visit (HOSPITAL_COMMUNITY)
Admission: EM | Admit: 2024-03-20 | Discharge: 2024-03-20 | Disposition: A | Discharge status: Home / Self Care | Attending: Nurse Practitioner | Admitting: Nurse Practitioner

## 2024-03-20 DIAGNOSIS — Z9884 Bariatric surgery status: Secondary | ICD-10-CM | POA: Diagnosis not present

## 2024-03-20 DIAGNOSIS — I959 Hypotension, unspecified: Secondary | ICD-10-CM

## 2024-03-20 DIAGNOSIS — K5903 Drug induced constipation: Secondary | ICD-10-CM

## 2024-03-20 DIAGNOSIS — S39012A Strain of muscle, fascia and tendon of lower back, initial encounter: Secondary | ICD-10-CM | POA: Diagnosis not present

## 2024-03-20 DIAGNOSIS — Z8679 Personal history of other diseases of the circulatory system: Secondary | ICD-10-CM

## 2024-03-20 DIAGNOSIS — Z7901 Long term (current) use of anticoagulants: Secondary | ICD-10-CM | POA: Diagnosis not present

## 2024-03-20 MED ORDER — DEXAMETHASONE SOD PHOSPHATE PF 10 MG/ML IJ SOLN
10.0000 mg | Freq: Once | INTRAMUSCULAR | Status: AC
  Administered 2024-03-20: 10 mg via INTRAMUSCULAR

## 2024-03-20 MED ORDER — CYCLOBENZAPRINE HCL 10 MG PO TABS: 10.0000 mg | ORAL_TABLET | Freq: Every day | ORAL | 0 refills | Status: DC

## 2024-03-20 MED ORDER — METHOCARBAMOL 500 MG PO TABS: 500.0000 mg | ORAL_TABLET | Freq: Every morning | ORAL | 0 refills | Status: DC

## 2024-03-20 MED ORDER — PREDNISONE 20 MG PO TABS: 40.0000 mg | ORAL_TABLET | Freq: Every day | ORAL | 0 refills | Status: AC

## 2024-03-20 NOTE — ED Provider Notes (Signed)
 [facility]    CSN: [id number] Arrival date & time: 03/20/24  9156      History   Chief Complaint Chief Complaint  Patient presents with   Back Pain    HPI Alexander [name] is a 48 y.o. male.   Discussed the use of AI scribe software for clinical note transcription with the patient, who gave verbal consent to proceed.   The patient presents with an acute exacerbation of chronic lower back pain that has worsened over the past several days. He has a 20-year history of lower back arthritis with intermittent severe flare-ups, though he reports this episode is more intense than usual. Approximately six weeks ago, the patient underwent gastric surgery and was recently cleared to resume physical activity. Over the past few days, he has played golf multiple times, which he believes may have led to overuse and triggered the current flare.  The pain began as tightness and discomfort a few days ago but has progressively worsened over the past 24 hours, with increasing inflammation and stiffness. The pain is centered in the mid-lumbar region, radiating to the left side and into the left buttock. He describes it as sharp when standing and transitioning from sitting to standing, then becoming achy and constant at rest. He reports marked difficulty and pain when getting out of bed and when standing upright.  The patient cannot take NSAIDs due to his medical history and is currently taking tramadol  for pain, which he used this morning for symptom relief. He denies numbness, tingling, weakness, groin pain, or bowel/bladder incontinence. Previous flare-ups have improved with short courses of prednisone , rest, and gradual stretching and exercise.  Since his bariatric surgery, he has experienced ongoing constipation that is managed with two scoops of MiraLax  each morning.  He also has a significant cardiac history, including hypertension, chronic systolic heart failure (currently at goal),  obstructive coronary artery disease with rare chest discomfort, stable angina status post stent placement several years ago, and paroxysmal atrial fibrillation. These conditions are reported to be stable under cardiology care. Current medications include carvedilol , doxazosin , isosorbide  mononitrate, and Entresto . He has scheduled follow-up appointments with both his primary care provider and cardiologist in early December.  The following sections of the patient's history were reviewed and updated as appropriate: allergies, current medications, past family history, past medical history, past social history, past surgical history, and problem list.     Past Medical History:  Diagnosis Date   Anxiety    Arrhythmia    Atrial fibrillation (HCC)    CAD (coronary artery disease) 08/17/2020   PCI + DES to pLCx, 70% mLAD negative FFR testing (medical therapy)    CHF (congestive heart failure) (HCC)    Chronic systolic heart failure (HCC)    Depression    Dysplastic nevi 07/12/2023   Severe atypia - right paraspinal superior - Ex needed Moderate - right paraspinal inferior - Ex needed Severe atypia - mid back - Ex needed     Gout    Hyperlipidemia    Hypertension    OSA (obstructive sleep apnea) 09/2020   cpap   Pre-diabetes     Patient Active Problem List   Diagnosis Date Noted   Morbid (severe) obesity due to excess calories (HCC) 02/05/2024   Dysplastic nevi 07/12/2023   Paroxysmal atrial fibrillation (HCC) 09/19/2022   Unstable angina (HCC) 07/12/2022   Morbid obesity (HCC) 06/17/2021   Chronic systolic heart failure (HCC)    Coronary artery disease involving native coronary artery  of native heart with unstable angina pectoris (HCC)    Acute systolic heart failure (HCC) 05/13/2020   Acute on chronic systolic (congestive) heart failure (HCC) 05/13/2020   Atrial fibrillation with RVR (HCC) 04/26/2020   HLD (hyperlipidemia) 04/26/2020   HTN (hypertension) 04/26/2020   Depression  04/26/2020   Gout 04/26/2020   Panic attacks 04/26/2020   Low back pain 04/26/2020    Past Surgical History:  Procedure Laterality Date   CARDIAC CATHETERIZATION     CARDIOVERSION N/A 05/15/2020   Procedure: CARDIOVERSION;  Surgeon: Cherrie Toribio SAUNDERS, MD;  Location: [facility] ENDOSCOPY;  Service: Cardiovascular;  Laterality: N/A;   CORONARY IMAGING/OCT N/A 07/13/2022   Procedure: INTRAVASCULAR IMAGING/OCT;  Surgeon: Anner Alm ORN, MD;  Location: [facility] INVASIVE CV LAB;  Service: Cardiovascular;  Laterality: N/A;   CORONARY STENT INTERVENTION N/A 08/17/2020   Procedure: CORONARY STENT INTERVENTION;  Surgeon: Verlin Lonni BIRCH, MD;  Location: [facility] INVASIVE CV LAB;  Service: Cardiovascular;  Laterality: N/A;   CORONARY STENT INTERVENTION N/A 07/13/2022   Procedure: CORONARY STENT INTERVENTION;  Surgeon: Anner Alm ORN, MD;  Location: [facility] INVASIVE CV LAB;  Service: Cardiovascular;  Laterality: N/A;   HIATAL HERNIA REPAIR N/A 02/05/2024   Procedure: REPAIR, HERNIA, HIATAL;  Surgeon: Kinsinger, Herlene Righter, MD;  Location: [facility] ORS;  Service: General;  Laterality: N/A;   LAPAROSCOPIC GASTRIC SLEEVE RESECTION N/A 02/05/2024   Procedure: GASTRECTOMY, SLEEVE, LAPAROSCOPIC;  Surgeon: Stevie Herlene Righter, MD;  Location: [facility] ORS;  Service: General;  Laterality: N/A;   LEFT HEART CATH AND CORONARY ANGIOGRAPHY N/A 07/13/2022   Procedure: LEFT HEART CATH AND CORONARY ANGIOGRAPHY;  Surgeon: Anner Alm ORN, MD;  Location: [facility] INVASIVE CV LAB;  Service: Cardiovascular;  Laterality: N/A;   left shoulder scope     MOLE REMOVAL  07/27/2023   RIGHT/LEFT HEART CATH AND CORONARY ANGIOGRAPHY N/A 08/17/2020   Procedure: RIGHT/LEFT HEART CATH AND CORONARY ANGIOGRAPHY;  Surgeon: Cherrie Toribio SAUNDERS, MD;  Location: [facility]VASIVE CV LAB;  Service: Cardiovascular;  Laterality: N/A;   SEPTOPLASTY     UPPER GI ENDOSCOPY N/A 02/05/2024   Procedure: ENDOSCOPY, UPPER GI TRACT;  Surgeon: Kinsinger, Herlene Righter, MD;  Location: [facility]  ORS;  Service: General;  Laterality: N/A;       Home Medications    Prior to Admission medications   Medication Sig Start Date End Date Taking? Authorizing Provider  cyclobenzaprine (FLEXERIL) 10 MG tablet Take 1 tablet (10 mg total) by mouth at bedtime. 03/20/24  Yes Iola Lukes, FNP  methocarbamol (ROBAXIN) 500 MG tablet Take 1 tablet (500 mg total) by mouth every morning. 03/20/24  Yes Erron Wengert, FNP  predniSONE  (DELTASONE ) 20 MG tablet Take 2 tablets (40 mg total) by mouth daily for 5 days. 03/21/24 03/26/24 Yes Iola Lukes, FNP  acetaminophen  (TYLENOL ) 500 MG tablet Take 500 mg by mouth every 6 (six) hours as needed for moderate pain.    [provider]  allopurinol  (ZYLOPRIM ) 100 MG tablet Take 100 mg by mouth daily. 02/13/20   [provider]  buPROPion  (WELLBUTRIN  SR) 150 MG 12 hr tablet Take 150 mg by mouth 2 (two) times daily.    [provider]  carvedilol  (COREG ) 6.25 MG tablet TAKE 1 TABLET BY MOUTH 2 TIMES DAILY WITH A MEAL. 11/20/20   Bensimhon, Toribio SAUNDERS, MD  cetirizine (ZYRTEC) 10 MG tablet Take 10 mg by mouth daily.    [provider]  colchicine 0.6 MG tablet Take 0.6 mg by mouth 2 (two) times daily as needed (  gout). 02/28/20   [provider]  dapagliflozin  propanediol (FARXIGA ) 10 MG TABS tablet TAKE 1 TABLET BY MOUTH EVERY DAY 02/02/24   Chandrasekhar, Mahesh A, MD  doxazosin  (CARDURA ) 2 MG tablet TAKE 1 TABLET BY MOUTH AT BEDTIME 01/02/24   Chandrasekhar, Mahesh A, MD  gabapentin (NEURONTIN) 100 MG capsule Take 1 capsule (100 mg total) by mouth every 12 (twelve) hours for 5 days. 02/06/24 02/11/24  Kinsinger, Herlene Righter, MD  isosorbide  mononitrate (IMDUR ) 30 MG 24 hr tablet Take 1 tablet (30 mg total) by mouth daily. 12/07/23   Santo Stanly LABOR, MD  Multiple Vitamin (MULTIVITAMIN WITH MINERALS) TABS tablet Take 1 tablet by mouth daily.    [provider]  nitroGLYCERIN  (NITROSTAT ) 0.4 MG SL tablet  Place 1 tablet (0.4 mg total) under the tongue every 5 (five) minutes as needed for chest pain. 07/14/22 01/29/24  Meng, Hao, PA  ondansetron  (ZOFRAN -ODT) 4 MG disintegrating tablet Take 1 tablet (4 mg total) by mouth every 6 (six) hours as needed for nausea or vomiting. 02/06/24   Kinsinger, Herlene Righter, MD  pantoprazole (PROTONIX) 40 MG tablet Take 1 tablet (40 mg total) by mouth daily. 02/06/24   Kinsinger, Herlene Righter, MD  risedronate (ACTONEL) 150 MG tablet Take 150 mg by mouth daily. 01/24/24   [provider]  rosuvastatin  (CRESTOR ) 40 MG tablet TAKE 1 TABLET BY MOUTH EVERY DAY 10/20/23   Chandrasekhar, Mahesh A, MD  sacubitril -valsartan  (ENTRESTO ) 97-103 MG TAKE 1 TABLET BY MOUTH TWICE A DAY 12/14/23   Chandrasekhar, Mahesh A, MD  tiZANidine  (ZANAFLEX ) 4 MG tablet Take 1 tablet (4 mg total) by mouth at bedtime as needed for muscle spasms. Patient not taking: Reported on 01/29/2024 12/08/23   Vonna Sharlet POUR, MD  XARELTO  20 MG TABS tablet TAKE 1 TABLET BY MOUTH EVERY DAY 07/20/23   Shlomo Wilbert SAUNDERS, MD    Family History Family History  Problem Relation Age of Onset   COPD Mother     Social History Social History   Tobacco Use   Smoking status: Former    Types: Cigars    Quit date: 08/14/2020    Years since quitting: 3.6   Smokeless tobacco: Never  Vaping Use   Vaping status: Never Used  Substance Use Topics   Alcohol use: Yes    Alcohol/week: 1.0 standard drink of alcohol    Types: 1 Cans of beer per week    Comment: drinks beer socially 1 per week   Drug use: Never     Allergies   Albuterol, Bee venom, Metaproterenol, Other, Amoxicillin-pot clavulanate, Cephalexin, and Amoxicillin   Review of Systems Review of Systems  Eyes:  Negative for visual disturbance.  Respiratory:  Negative for shortness of breath.   Cardiovascular:  Negative for chest pain, palpitations and leg swelling.  Gastrointestinal:  Positive for constipation. Negative for blood in stool, nausea and  vomiting.       No bowel incontinence    Genitourinary:  Negative for dysuria and hematuria.       No bladder incontinence   Musculoskeletal:  Positive for back pain.  Neurological:  Positive for light-headedness. Negative for weakness and numbness.  All other systems reviewed and are negative.    Physical Exam Triage Vital Signs ED Triage Vitals  Encounter Vitals Group     BP 03/20/24 0948 100/66     Girls Systolic BP Percentile --      Girls Diastolic BP Percentile --      Boys Systolic  BP Percentile --      Boys Diastolic BP Percentile --      Pulse Rate 03/20/24 0948 63     Resp 03/20/24 0948 18     Temp 03/20/24 0948 98.2 F (36.8 C)     Temp Source 03/20/24 0948 Oral     SpO2 03/20/24 0948 95 %     Weight --      Height --      Head Circumference --      Peak Flow --      Pain Score 03/20/24 0947 9     Pain Loc --      Pain Education --      Exclude from Growth Chart --    No data found.  Updated Vital Signs BP 100/66 (BP Location: Right Arm)   Pulse 63   Temp 98.2 F (36.8 C) (Oral)   Resp 18   SpO2 95%   Visual Acuity Right Eye Distance:   Left Eye Distance:   Bilateral Distance:    Right Eye Near:   Left Eye Near:    Bilateral Near:     Physical Exam Vitals reviewed.  Constitutional:      General: He is not in acute distress.    Appearance: Normal appearance. He is not ill-appearing, toxic-appearing or diaphoretic.  HENT:     Head: Normocephalic.     Mouth/Throat:     Mouth: Mucous membranes are moist.  Cardiovascular:     Rate and Rhythm: Normal rate and regular rhythm.  Pulmonary:     Effort: Pulmonary effort is normal.     Breath sounds: Normal breath sounds.  Abdominal:     Palpations: Abdomen is soft.     Tenderness: There is no right CVA tenderness or left CVA tenderness.  Musculoskeletal:        General: Normal range of motion.     Cervical back: Normal, normal range of motion and neck supple.     Thoracic back: Normal.      Lumbar back: Tenderness present. No swelling, deformity, lacerations, spasms or bony tenderness. Normal range of motion. Negative right straight leg raise test and negative left straight leg raise test.       Back:     Comments: Right paraspinal lumbar tenderness noted on examination. No focal neurological deficits appreciated. Neurovascular status intact.   Skin:    General: Skin is warm and dry.  Neurological:     General: No focal deficit present.     Mental Status: He is alert and oriented to person, place, and time.     Cranial Nerves: Cranial nerves 2-12 are intact.     Sensory: Sensation is intact.     Motor: Motor function is intact. No weakness.     Coordination: Coordination is intact.     Gait: Gait is intact.  Psychiatric:        Mood and Affect: Mood normal.        Speech: Speech normal.        Behavior: Behavior normal. Behavior is cooperative.      UC Treatments / Results  Labs (all labs ordered are listed, but only abnormal results are displayed) Labs Reviewed - No data to display  EKG   Radiology No results found.  Procedures Procedures (including critical care time)  Medications Ordered in UC Medications  dexamethasone (DECADRON) injection 10 mg (10 mg Intramuscular Given 03/20/24 1043)    Initial Impression / Assessment and Plan / UC Course  I have reviewed the triage vital signs and the nursing notes.  Pertinent labs & imaging results that were available during my care of the patient were reviewed by me and considered in my medical decision making (see chart for details).     Patient presents with an acute flare-up of chronic lower back arthritis, with pain worsening over the past two days following a return to golf activities after a six-week post-bariatric surgery recovery period. The pain is described as sharp when standing and achy at rest, radiating from the mid-lumbar region to the left buttock, without associated numbness, tingling, groin  pain, or incontinence. Physical findings are consistent with a musculoskeletal etiology. Decadron was administered in the clinic for inflammation, and a short course of prednisone  was prescribed to begin tomorrow. Methocarbamol was prescribed for daytime use and cyclobenzaprine for nighttime muscle relaxation. Previously prescribed Tramadol  can be used for severe breakthrough pain. Patient instructed to avoid NSAIDs due to history of bariatric surgery and current Xarelto  use, and may take acetaminophen  up to 1000 mg every six hours as needed. Recommended alternating ice and heat therapy, rest, and avoidance of lifting more than 10 pounds until symptoms improve. Advised follow-up with primary care if pain persists beyond several days or worsens, and to seek emergency care for new onset of numbness, weakness, incontinence, or inability to ambulate.  Patient also noted to have mild hypotension with blood pressures averaging around 100 mmHg following recent significant post-bariatric weight loss. Symptoms of occasional lightheadedness on standing were reported. Based on recent cardiology documentation, isosorbide  could be discontinued post-bariatric surgery if BP lowers. Therefore, isosorbide  as well as doxazosin  was discontinued until he follows up with cardiology. All other cardiac medications, including Xarelto , were continued. Patient advised to monitor and record blood pressure daily and to bring readings to upcoming primary care and cardiology appointments in early December. Emergency evaluation advised for chest pain, shortness of breath, syncope, or sustained systolic blood pressure below 90 mmHg.  History of atrial fibrillation with heart failure remains stable on anticoagulation, with normal renal function and GFR above 60. Patient to continue Xarelto  as prescribed and avoid all NSAIDs.  Constipation related to recent bariatric surgery remains well controlled with daily MiraLax . Patient advised to  continue current regimen, increase dietary fiber, and maintain adequate hydration. Follow up with primary care if symptoms worsen or bowel movements stop despite treatment.  Today's evaluation has revealed no signs of a dangerous process. Discussed diagnosis with patient and/or guardian. Patient and/or guardian aware of their diagnosis, possible red flag symptoms to watch out for and need for close follow up. Patient and/or guardian understands verbal and written discharge instructions. Patient and/or guardian comfortable with plan and disposition.  Patient and/or guardian has a clear mental status at this time, good insight into illness (after discussion and teaching) and has clear judgment to make decisions regarding their care  Documentation was completed with the aid of voice recognition software. Transcription may contain typographical errors.  Final Clinical Impressions(s) / UC Diagnoses   Final diagnoses:  Strain of lumbar region, initial encounter  Hypotension, unspecified hypotension type  History of bariatric surgery  Chronic anticoagulation  Drug-induced constipation  History of atrial fibrillation     Discharge Instructions      Your symptoms today are consistent with a flare-up of your chronic lower back arthritis. This likely occurred after returning to golf activities following your six-week post-bariatric surgery recovery. The pain is sharp when standing and more of an achy soreness at  rest, radiating from the lower back into the left buttock. There are no signs of nerve involvement, weakness, or incontinence. You received a Decadron injection in the clinic to help with inflammation and pain. Starting tomorrow, take prednisone  once daily for five days. Methocarbamol should be taken in the morning to help relax your muscles during the day, and cyclobenzaprine should be taken at bedtime to help you rest more comfortably. You may use your previously prescribed Tramadol  if the pain  becomes severe. Avoid taking ibuprofen, Aleve, or Motrin since you have had bariatric surgery and are taking Xarelto . For additional pain relief, you may take Tylenol  (acetaminophen ) 1000 mg every six hours as needed, but do not exceed 4000 mg within 24 hours.  Apply ice or heat to your lower back to relieve pain and stiffness--use ice for 15-20 minutes to decrease inflammation and heat to relax the muscles. Avoid lifting anything heavier than 10 pounds and rest when needed, but try gentle stretching and walking as tolerated to prevent stiffness. Follow up with your primary care provider if your back pain lasts more than a few days, worsens, or begins radiating down your legs. Go to the emergency department if you develop new numbness, tingling, weakness, loss of bladder or bowel control, or cannot walk because of pain.  Your blood pressure is running lower than your usual readings, which can happen after significant weight loss following bariatric surgery. You report feeling lightheaded sometimes when standing up too quickly. Two of your medications--isosorbide  and doxazosin --have been stopped today, as these can contribute to low blood pressure. Continue taking your other heart medications, including Xarelto , as prescribed. Check your blood pressure every day and keep a log to bring to your upcoming appointments with your primary care provider on December 3rd and your cardiologist on December 4th. If you experience chest pain, shortness of breath, fainting, or a top blood pressure number (systolic) consistently below 90, seek emergency care immediately.  Your atrial fibrillation and heart failure are currently stable. Continue taking Xarelto  daily and do not take NSAIDs like ibuprofen, Aleve, or Motrin, as these can interfere with your medication and cause complications. Your constipation following bariatric surgery is well controlled with your current MiraLax  routine. Continue taking two scoops of MiraLax   each morning, drink plenty of water throughout the day, and include more fiber-rich foods such as vegetables, fruits, and whole grains. If your constipation worsens, you go several days without a bowel movement, or your symptoms change, contact your primary care provider for further evaluation.     ED Prescriptions     Medication Sig Dispense Auth. Provider   methocarbamol (ROBAXIN) 500 MG tablet Take 1 tablet (500 mg total) by mouth every morning. 10 tablet Iola Lukes, FNP   cyclobenzaprine (FLEXERIL) 10 MG tablet Take 1 tablet (10 mg total) by mouth at bedtime. 10 tablet Oluwanifemi Susman, FNP   predniSONE  (DELTASONE ) 20 MG tablet Take 2 tablets (40 mg total) by mouth daily for 5 days. 10 tablet Iola Lukes, FNP      PDMP not reviewed this encounter.   Iola Lukes, [place] 03/20/24 1109

## 2024-03-20 NOTE — Discharge Instructions (Addendum)
 Your symptoms today are consistent with a flare-up of your chronic lower back arthritis. This likely occurred after returning to golf activities following your six-week post-bariatric surgery recovery. The pain is sharp when standing and more of an achy soreness at rest, radiating from the lower back into the left buttock. There are no signs of nerve involvement, weakness, or incontinence. You received a Decadron injection in the clinic to help with inflammation and pain. Starting tomorrow, take prednisone  once daily for five days. Methocarbamol should be taken in the morning to help relax your muscles during the day, and cyclobenzaprine should be taken at bedtime to help you rest more comfortably. You may use your previously prescribed Tramadol  if the pain becomes severe. Avoid taking ibuprofen, Aleve, or Motrin since you have had bariatric surgery and are taking Xarelto . For additional pain relief, you may take Tylenol  (acetaminophen ) 1000 mg every six hours as needed, but do not exceed 4000 mg within 24 hours.  Apply ice or heat to your lower back to relieve pain and stiffness--use ice for 15-20 minutes to decrease inflammation and heat to relax the muscles. Avoid lifting anything heavier than 10 pounds and rest when needed, but try gentle stretching and walking as tolerated to prevent stiffness. Follow up with your primary care provider if your back pain lasts more than a few days, worsens, or begins radiating down your legs. Go to the emergency department if you develop new numbness, tingling, weakness, loss of bladder or bowel control, or cannot walk because of pain.  Your blood pressure is running lower than your usual readings, which can happen after significant weight loss following bariatric surgery. You report feeling lightheaded sometimes when standing up too quickly. Two of your medications--isosorbide  and doxazosin --have been stopped today, as these can contribute to low blood pressure. Continue  taking your other heart medications, including Xarelto , as prescribed. Check your blood pressure every day and keep a log to bring to your upcoming appointments with your primary care provider on December 3rd and your cardiologist on December 4th. If you experience chest pain, shortness of breath, fainting, or a top blood pressure number (systolic) consistently below 90, seek emergency care immediately.  Your atrial fibrillation and heart failure are currently stable. Continue taking Xarelto  daily and do not take NSAIDs like ibuprofen, Aleve, or Motrin, as these can interfere with your medication and cause complications. Your constipation following bariatric surgery is well controlled with your current MiraLax  routine. Continue taking two scoops of MiraLax  each morning, drink plenty of water throughout the day, and include more fiber-rich foods such as vegetables, fruits, and whole grains. If your constipation worsens, you go several days without a bowel movement, or your symptoms change, contact your primary care provider for further evaluation.

## 2024-03-20 NOTE — ED Triage Notes (Signed)
 Pt c/o lower back pain from mid to left side for a couple days. Tried stretching, Tramadol  and other medications that aren't helping. Denies injury.

## 2024-03-21 ENCOUNTER — Encounter: Payer: Self-pay | Admitting: Internal Medicine

## 2024-04-04 ENCOUNTER — Other Ambulatory Visit (HOSPITAL_COMMUNITY): Payer: Self-pay | Admitting: Internal Medicine

## 2024-04-09 NOTE — Progress Notes (Unsigned)
 Cardiology Clinic Note   Patient Name: Alexander [name] Date of Encounter: 04/11/2024  Primary Care Provider:  Santo Domino, MD Primary Cardiologist:  Toribio Fuel, MD  Patient Profile    Alexander [name] 48 year old male presents the clinic today for follow-up evaluation of his HTN and chronic systolic CHF.  Past Medical History    Past Medical History:  Diagnosis Date   Anxiety    Arrhythmia    Atrial fibrillation (HCC)    CAD (coronary artery disease) 08/17/2020   PCI + DES to pLCx, 70% mLAD negative FFR testing (medical therapy)    CHF (congestive heart failure) (HCC)    Chronic systolic heart failure (HCC)    Depression    Dysplastic nevi 07/12/2023   Severe atypia - right paraspinal superior - Ex needed Moderate - right paraspinal inferior - Ex needed Severe atypia - mid back - Ex needed     Gout    Hyperlipidemia    Hypertension    OSA (obstructive sleep apnea) 09/2020   cpap   Pre-diabetes    Past Surgical History:  Procedure Laterality Date   CARDIAC CATHETERIZATION     CARDIOVERSION N/A 05/15/2020   Procedure: CARDIOVERSION;  Surgeon: Fuel Toribio SAUNDERS, MD;  Location: [facility] ENDOSCOPY;  Service: Cardiovascular;  Laterality: N/A;   CORONARY IMAGING/OCT N/A 07/13/2022   Procedure: INTRAVASCULAR IMAGING/OCT;  Surgeon: Anner Alm ORN, MD;  Location: [facility] INVASIVE CV LAB;  Service: Cardiovascular;  Laterality: N/A;   CORONARY STENT INTERVENTION N/A 08/17/2020   Procedure: CORONARY STENT INTERVENTION;  Surgeon: Verlin Lonni BIRCH, MD;  Location: [facility] INVASIVE CV LAB;  Service: Cardiovascular;  Laterality: N/A;   CORONARY STENT INTERVENTION N/A 07/13/2022   Procedure: CORONARY STENT INTERVENTION;  Surgeon: Anner Alm ORN, MD;  Location: [facility] INVASIVE CV LAB;  Service: Cardiovascular;  Laterality: N/A;   HIATAL HERNIA REPAIR N/A 02/05/2024   Procedure: REPAIR, HERNIA, HIATAL;  Surgeon: Kinsinger, Herlene Righter, MD;  Location: [facility] ORS;  Service: General;   Laterality: N/A;   LAPAROSCOPIC GASTRIC SLEEVE RESECTION N/A 02/05/2024   Procedure: GASTRECTOMY, SLEEVE, LAPAROSCOPIC;  Surgeon: Stevie Herlene Righter, MD;  Location: [facility] ORS;  Service: General;  Laterality: N/A;   LEFT HEART CATH AND CORONARY ANGIOGRAPHY N/A 07/13/2022   Procedure: LEFT HEART CATH AND CORONARY ANGIOGRAPHY;  Surgeon: Anner Alm ORN, MD;  Location: [facility] INVASIVE CV LAB;  Service: Cardiovascular;  Laterality: N/A;   left shoulder scope     MOLE REMOVAL  07/27/2023   RIGHT/LEFT HEART CATH AND CORONARY ANGIOGRAPHY N/A 08/17/2020   Procedure: RIGHT/LEFT HEART CATH AND CORONARY ANGIOGRAPHY;  Surgeon: Fuel Toribio SAUNDERS, MD;  Location: [facility] INVASIVE CV LAB;  Service: Cardiovascular;  Laterality: N/A;   SEPTOPLASTY     UPPER GI ENDOSCOPY N/A 02/05/2024   Procedure: ENDOSCOPY, UPPER GI TRACT;  Surgeon: Stevie, Herlene Righter, MD;  Location: [facility] ORS;  Service: General;  Laterality: N/A;    Allergies  Allergies  Allergen Reactions   Albuterol Anaphylaxis   Bee Venom Anaphylaxis   Metaproterenol Anaphylaxis   Other Anaphylaxis    DEET   Amoxicillin-Pot Clavulanate Itching    OK with Amoxicillin   Cephalexin Other (See Comments)    Chest tightness   Amoxicillin Itching and Rash    Itching all over      History of Present Illness    Alexander [name] has a PMH of chronic systolic CHF, CAD, paroxysmal atrial fibrillation, HLD, and HTN.  He underwent cardiac catheterization in 2022 and received PCI with DES to  his circumflex.  He underwent subsequent LHC on 07/13/2022 and received PCI with DES to his proximal-mid LAD.  At that time his EF was noted to be 50-55% and no aortic valve stenosis was noted.  He was seen in follow-up by Dr. Santo 12/06/2023.  During that time his CHF was well-managed with medical therapy.  His blood pressure was well-controlled.  He was asymptomatic.  He remained in sinus rhythm.  He did note occasional episodes of dizziness and lightheadedness with  standing.  He attributed this to his medication.  He was using Zepbound for weight management and lost around 30 pounds since March.  He was planning to undergo bariatric surgery in October.  He presents to the clinic today for follow-up evaluation and states he feels great.  He reports that he has lost over 70 pounds since his bariatric surgery.  He had recent lab work done with his PCP.  We reviewed this.  He reports that he ramped up exercise very quickly and has been having some back trouble.  During evaluation for his back pain he was noted to have systolic blood pressure in the 90s.  He contacted our clinic and his Imdur  was paused.  He subsequently started taking the Imdur  again and in the clinic today his blood pressure is 102/70.  I will decrease his doxazosin  to 1 mg daily and have him continue with his diet and physical activity.  I will also repeat his echocardiogram.  Will plan follow-up in 4 to 6 months.  I have asked him to continue to monitor his blood pressure.  Today he denies chest pain, shortness of breath, lower extremity edema, fatigue, palpitations, melena, hematuria, hemoptysis, diaphoresis, weakness, presyncope, syncope, orthopnea, and PND.    Home Medications    Prior to Admission medications   Medication Sig Start Date End Date Taking? Authorizing Provider  acetaminophen  (TYLENOL ) 500 MG tablet Take 500 mg by mouth every 6 (six) hours as needed for moderate pain.    [provider]  allopurinol  (ZYLOPRIM ) 100 MG tablet Take 100 mg by mouth daily. 02/13/20   [provider]  buPROPion  (WELLBUTRIN  SR) 150 MG 12 hr tablet Take 150 mg by mouth 2 (two) times daily.    [provider]  carvedilol  (COREG ) 6.25 MG tablet TAKE 1 TABLET BY MOUTH 2 TIMES DAILY WITH A MEAL. 11/20/20   Bensimhon, Toribio SAUNDERS, MD  cetirizine (ZYRTEC) 10 MG tablet Take 10 mg by mouth daily.    [provider]  colchicine 0.6 MG tablet Take 0.6 mg by mouth 2 (two) times  daily as needed (gout). 02/28/20   [provider]  cyclobenzaprine  (FLEXERIL ) 10 MG tablet Take 1 tablet (10 mg total) by mouth at bedtime. 03/20/24   Iola Lukes, FNP  dapagliflozin  propanediol (FARXIGA ) 10 MG TABS tablet TAKE 1 TABLET BY MOUTH EVERY DAY 02/02/24   Chandrasekhar, Mahesh A, MD  doxazosin  (CARDURA ) 2 MG tablet TAKE 1 TABLET BY MOUTH AT BEDTIME 01/02/24   Chandrasekhar, Mahesh A, MD  gabapentin  (NEURONTIN ) 100 MG capsule Take 1 capsule (100 mg total) by mouth every 12 (twelve) hours for 5 days. 02/06/24 02/11/24  Kinsinger, Herlene Righter, MD  isosorbide  mononitrate (IMDUR ) 30 MG 24 hr tablet Take 1 tablet (30 mg total) by mouth daily. 12/07/23   Santo Stanly LABOR, MD  methocarbamol  (ROBAXIN ) 500 MG tablet Take 1 tablet (500 mg total) by mouth every morning. 03/20/24   Murrill, Samantha, FNP  Multiple Vitamin (MULTIVITAMIN WITH MINERALS) TABS tablet  Take 1 tablet by mouth daily.    [provider]  nitroGLYCERIN  (NITROSTAT ) 0.4 MG SL tablet Place 1 tablet (0.4 mg total) under the tongue every 5 (five) minutes as needed for chest pain. 07/14/22 01/29/24  Meng, Hao, PA  ondansetron  (ZOFRAN -ODT) 4 MG disintegrating tablet Take 1 tablet (4 mg total) by mouth every 6 (six) hours as needed for nausea or vomiting. 02/06/24   Kinsinger, Herlene Righter, MD  pantoprazole  (PROTONIX ) 40 MG tablet Take 1 tablet (40 mg total) by mouth daily. 02/06/24   Kinsinger, Herlene Righter, MD  risedronate  (ACTONEL ) 150 MG tablet Take 150 mg by mouth daily. 01/24/24   [provider]  rosuvastatin  (CRESTOR ) 40 MG tablet TAKE 1 TABLET BY MOUTH EVERY DAY 10/20/23   Chandrasekhar, Mahesh A, MD  sacubitril -valsartan  (ENTRESTO ) 97-103 MG TAKE 1 TABLET BY MOUTH TWICE A DAY 12/14/23   Chandrasekhar, Mahesh A, MD  tiZANidine  (ZANAFLEX ) 4 MG tablet Take 1 tablet (4 mg total) by mouth at bedtime as needed for muscle spasms. Patient not taking: Reported on 01/29/2024 12/08/23   Vonna Sharlet POUR, MD  XARELTO   20 MG TABS tablet TAKE 1 TABLET BY MOUTH EVERY DAY 07/20/23   Shlomo Wilbert SAUNDERS, MD    Family History    Family History  Problem Relation Age of Onset   COPD Mother    He indicated that the status of his mother is unknown.  Social History    Social History   Socioeconomic History   Marital status: Married    Spouse name: Not on file   Number of children: 2   Years of education: 16   Highest education level: Bachelor's degree (e.g., BA, AB, BS)  Occupational History   Not on file  Tobacco Use   Smoking status: Former    Types: Cigars    Quit date: 08/14/2020    Years since quitting: 3.6   Smokeless tobacco: Never  Vaping Use   Vaping status: Never Used  Substance and Sexual Activity   Alcohol use: Yes    Alcohol/week: 1.0 standard drink of alcohol    Types: 1 Cans of beer per week    Comment: drinks beer socially 1 per week   Drug use: Never   Sexual activity: Not on file  Other Topics Concern   Not on file  Social History Narrative   Not on file   Social Drivers of Health   Financial Resource Strain: Not on file  Food Insecurity: Low Risk  (04/08/2024)   Received from [facility]   Hunger Vital Sign    Within the past 12 months, you worried that your food would run out before you got money to buy more: Never true    Within the past 12 months, the food you bought just didn't last and you didn't have money to get more. : Never true  Transportation Needs: No Transportation Needs (04/08/2024)   Received from [facility]    In the past 12 months, has lack of reliable transportation kept you from medical appointments, meetings, work or from getting things needed for daily living? : No  Physical Activity: Not on file  Stress: Not on file  Social Connections: Unknown (09/07/2021)   [facility] from [facility]   Social Network    Social Network: Not on file  Intimate Partner Violence: Not At Risk (02/05/2024)   Humiliation, Afraid, Rape, and Kick  questionnaire    Fear of Current or Ex-Partner: No  Emotionally Abused: No    Physically Abused: No    Sexually Abused: No     Review of Systems    General:  No chills, fever, night sweats or weight changes.  Cardiovascular:  No chest pain, dyspnea on exertion, edema, orthopnea, palpitations, paroxysmal nocturnal dyspnea. Dermatological: No rash, lesions/masses Respiratory: No cough, dyspnea Urologic: No hematuria, dysuria Abdominal:   No nausea, vomiting, diarrhea, bright red blood per rectum, melena, or hematemesis Neurologic:  No visual changes, wkns, changes in mental status. All other systems reviewed and are otherwise negative except as noted above.  Physical Exam    VS:  BP 102/70 (BP Location: Left Arm, Patient Position: Sitting, Cuff Size: Large)   Pulse 61   Ht 6' 1 (1.854 m)   Wt 251 lb (113.9 kg)   BMI 33.12 kg/m  , BMI Body mass index is 33.12 kg/m. GEN: Well nourished, well developed, in no acute distress. HEENT: normal. Neck: Supple, no JVD, carotid bruits, or masses. Cardiac: RRR, no murmurs, rubs, or gallops. No clubbing, cyanosis, edema.  Radials/DP/PT 2+ and equal bilaterally.  Respiratory:  Respirations regular and unlabored, clear to auscultation bilaterally. GI: Soft, nontender, nondistended, BS + x 4. MS: no deformity or atrophy. Skin: warm and dry, no rash. Neuro:  Strength and sensation are intact. Psych: Normal affect.  Accessory Clinical Findings    Recent Labs: 01/31/2024: ALT 65; BUN 18; Potassium 4.0; Sodium 138 02/05/2024: Creatinine, Ser 0.89 02/06/2024: Hemoglobin 15.2; Platelets 233   Recent Lipid Panel    Component Value Date/Time   CHOL 87 07/13/2022 0238   CHOL 96 (L) 03/22/2022 0856   TRIG 237 (H) 07/13/2022 0238   HDL 28 (L) 07/13/2022 0238   HDL 32 (L) 03/22/2022 0856   CHOLHDL 3.1 07/13/2022 0238   VLDL 47 (H) 07/13/2022 0238   LDLCALC 12 07/13/2022 0238   LDLCALC 37 03/22/2022 0856         ECG personally reviewed  by me today- EKG Interpretation Date/Time:  Thursday April 11 2024 09:21:45 EST Ventricular Rate:  61 PR Interval:  170 QRS Duration:  88 QT Interval:  404 QTC Calculation: 406 R Axis:   -18  Text Interpretation: Normal sinus rhythm Inferior infarct (cited on or before 04-Jul-2023) Possible Anterior infarct (cited on or before 04-Jul-2023) When compared with ECG of 04-Jul-2023 09:22, Questionable change in initial forces of Lateral leads Confirmed by Emelia Hazy [phone]) on 04/11/2024 9:28:18 AM   [facility] 07/13/2022    Prox LAD to Mid LAD lesion is 80% stenosed.-Progression of disease (stenosis confirmed via OCT)   A drug-eluting stent was successfully placed using a SYNERGY XD [id number]. ->  Deployed to 3.7 mm.  Taper postdilation from 4.6-4.22-3.7 mm = full expansion. Post intervention, there is a 0% residual stenosis.   Mid RCA lesion is 45% stenosed.  RPDA lesion is 55% stenosed.   Previously placed Ost Cx to Prox Cx stent of unknown type is  widely patent.   The left ventricular systolic function is normal.   The left ventricular ejection fraction is 50-55% by visual estimate.   There is no aortic valve stenosis.   POST-OPERATIVE DIAGNOSIS:   Culprit Lesion: Progression of mid LAD lesion just after diagonal branch at SP1 in progress from 70% to roughly 80% OCT guided PCI from major diagonal branch just beyond the next branch: Synergy XD 3.5 mm x 28 mm deployed to 3.7 mm and postdilated in tapered fashion from 4.6-4.2-3.7 mm Widely patent LCx stent Distal RCA 45%  and ostial PDA 55 % stenosis. LV gram relatively poor filling but EF does appear to be low normal 50 to 55%.  (Defer to echo)     PLAN OF CARE:  Overnight monitoring, restart Xarelto  at p.m. dosing tonight; would do 1 month of aspirin , Plavix  and Xarelto  and then discontinue aspirin .  Continue Plavix  for 6 months total,then discontinue   Diagnostic Dominance: Right  Intervention       Assessment & Plan   1.   Paroxysmal atrial fibrillation-heart rate today 61 bpm.  Denies recent episodes of accelerated or irregular heartbeat.  Reports compliance with Xarelto .  Denies bleeding issues. Avoid triggers caffeine, chocolate, EtOH, dehydration excetra. Continue Xarelto , carvedilol   Coronary artery disease-no chest pain today.  Denies exertional chest discomfort. Heart healthy low-sodium diet Increase physical activity as tolerated Continue carvedilol , nitroglycerin  as needed, rosuvastatin   Chronic systolic CHF-weight today 251lbs.  NYHA class I.  Euvolemic. Daily weights Elevate lower extremities when not active Continue Entresto , carvedilol , Farxiga  Repeat echo  Obesity-weight today 251.  Continues to lose weight.  He is now post bariatric surgery. Maintain physical activity Continue current diet  Hyperlipidemia-LDL 12 on 07/13/22. High-fiber diet Continue rosuvastatin   Essential hypertension-BP today 102/70.  Imdur  and doxazosin  paused in the setting of decreased blood pressure secondary to weight loss. Maintain blood pressure log Continue Entresto , Imdur , Decrease doxazosin  to 1 mg daily  Disposition: Follow-up with Dr. Santo or me in 4-6 months.   Josefa HERO. Filomena Pokorney NP-C     04/11/2024, 9:48 AM [facility] [address] [place], [place] [address] Office [phone]    Notice: This dictation was prepared with Dragon dictation along with smaller phrase technology. Any transcriptional errors that result from this process are unintentional and may not be corrected upon review.   I spent 14 minutes examining this patient, reviewing medications, and using patient centered shared decision making involving their cardiac care.   I spent  20 minutes reviewing past medical history,  medications, and prior cardiac tests.

## 2024-04-10 ENCOUNTER — Encounter: Payer: Self-pay | Admitting: Cardiology

## 2024-04-11 ENCOUNTER — Encounter: Payer: Self-pay | Admitting: General Practice

## 2024-04-11 ENCOUNTER — Ambulatory Visit: Admitting: Nurse Practitioner

## 2024-04-11 ENCOUNTER — Ambulatory Visit: Attending: General Practice | Admitting: General Practice

## 2024-04-11 VITALS — BP 102/70 | HR 61 | Ht 73.0 in | Wt 251.0 lb

## 2024-04-11 DIAGNOSIS — I5022 Chronic systolic (congestive) heart failure: Secondary | ICD-10-CM | POA: Diagnosis not present

## 2024-04-11 DIAGNOSIS — I25110 Atherosclerotic heart disease of native coronary artery with unstable angina pectoris: Secondary | ICD-10-CM | POA: Diagnosis not present

## 2024-04-11 DIAGNOSIS — I480 Paroxysmal atrial fibrillation: Secondary | ICD-10-CM

## 2024-04-11 DIAGNOSIS — I10 Essential (primary) hypertension: Secondary | ICD-10-CM

## 2024-04-11 DIAGNOSIS — E782 Mixed hyperlipidemia: Secondary | ICD-10-CM

## 2024-04-11 MED ORDER — DOXAZOSIN MESYLATE 2 MG PO TABS: 1.0000 mg | ORAL_TABLET | Freq: Every day | ORAL | 3 refills | Status: AC

## 2024-04-11 NOTE — Patient Instructions (Addendum)
 Medication Instructions:  DECREASE DOXAZOSIN  TO 1 MG (1/2 TABLET) DAILY.  CONTINUE ALL OTHER CURRENT MEDICATION THERAPY.  Lab Work: NONE TO BE DONE TODAY.  Testing/Procedures: Your physician has requested that you have an echocardiogram. Echocardiography is a painless test that uses sound waves to create images of your heart. It provides your doctor with information about the size and shape of your heart and how well your heart's chambers and valves are working. This procedure takes approximately one hour. There are no restrictions for this procedure. Please do NOT wear cologne, perfume, aftershave, or lotions (deodorant is allowed). Please arrive 15 minutes prior to your appointment time.  Please note: We ask at that you not bring children with you during ultrasound (echo/ vascular) testing. Due to room size and safety concerns, children are not allowed in the ultrasound rooms during exams. Our front office staff cannot provide observation of children in our lobby area while testing is being conducted. An adult accompanying a patient to their appointment will only be allowed in the ultrasound room at the discretion of the ultrasound technician under special circumstances. We apologize for any inconvenience.   Follow-Up: At [facility], you and your health needs are our priority.  As part of our continuing mission to provide you with exceptional heart care, our providers are all part of one team.  This team includes your primary Cardiologist (physician) and Advanced Practice Providers or APPs (Physician Assistants and Nurse Practitioners) who all work together to provide you with the care you need, when you need it.  Your next appointment:   4-6 MONTHS  Provider:   DR. SANTO, MD    Other Instructions:  CONTINUE WITH CURRENT DIET AND PHYSICAL EXERCISE.

## 2024-04-29 ENCOUNTER — Encounter: Payer: Self-pay | Admitting: Dietician

## 2024-04-29 ENCOUNTER — Encounter: Attending: General Surgery | Admitting: Dietician

## 2024-04-29 VITALS — Ht 73.0 in | Wt 244.5 lb

## 2024-04-29 DIAGNOSIS — E669 Obesity, unspecified: Secondary | ICD-10-CM | POA: Diagnosis present

## 2024-04-29 NOTE — Progress Notes (Signed)
 Bariatric Nutrition Follow-Up Visit Medical Nutrition Therapy  Appt Start Time: 0931   End Time: 1008  Surgery date: 02/05/2024 Surgery type: Sleeve Gastrectomy  NUTRITION ASSESSMENT  Anthropometrics  Start weight at [facility]: 329.2 lbs (date: 07/06/2023) Height: 73 in Weight today: 244.5 lbs  Clinical  Medical hx: hypercholesterolemia, HTN, sleep apnea, obesity Medications: see EMR Labs:  Notable signs/symptoms: none noted Any previous deficiencies? No Bowel Habits: Every day to every other day no complaints   Body Composition Scale 02/20/2024 04/29/2024  Current Body Weight 289.9 244.5  Total Body Fat % 33.3 27.3  Visceral Fat  18  Fat-Free Mass % 66.6 72.6   Total Body Water  % 47.6 53.6  Muscle-Mass lbs  46.0  BMI 38.1 32.1  Body Fat Displacement           Torso  lbs  41.4         Left Leg  lbs  8.2         Right Leg  lbs  8.2         Left Arm  lbs  4.1         Right Arm  lbs  4.1    Lifestyle & Dietary Hx Pt states he had an episode of dumping syndrome after eating pasta. Pt states he is not taking one of his blood pressure medication anymore. Pt states he gets a lot of support from home, stating if he had done this while single, he doesn't think he would have as much success.  Estimated daily fluid intake: 80-100 oz Estimated daily protein intake: 100-105 g Supplements: multivitamin and calcium  Current average weekly physical activity: golf, walking, yoga (chair)   24-Hr Dietary Recall First Meal: yogurt or one egg/cheese, black coffee with protein shake, sometimes bacon Snack: yogurt or protein shake  Second Meal: meal prep bowls with meat 3 oz (turkey, fish, chicken, etc...), vegetables, Shamiah Kahler rice Snack: nuts or fruit cup  Third Meal: meal prep bowls with meat, vegetables, Terria Deschepper rice Snack: not usually, but maybe a sf jello or sf pudding Beverages: water , water  with flavorings, black coffee, un-sweet tea  Post-Op Goals/ Signs/ Symptoms Using straws:  no Drinking while eating: no Chewing/swallowing difficulties: no Changes in vision: no Changes to mood/headaches: no Hair loss/changes to skin/nails: no Difficulty focusing/concentrating: no Sweating: no Limb weakness: no Dizziness/lightheadedness: no Palpitations: no  Carbonated/caffeinated beverages: no N/V/D/C/Gas: episode of dumping syndrome Abdominal pain: no Dumping syndrome: no   NUTRITION DIAGNOSIS  Overweight/obesity ([place]-3.3) related to past poor dietary habits and physical inactivity as evidenced by completed bariatric surgery and following dietary guidelines for continued weight loss and healthy nutrition status.   NUTRITION INTERVENTION Nutrition counseling (C-1) and education (E-2) to facilitate bariatric surgery goals, including: Diet advancement to the standard prep plan The importance of consuming adequate calories as well as certain nutrients daily due to the body's need for essential vitamins, minerals, and fats The importance of daily physical activity and to reach a goal of at least 150 minutes of moderate to vigorous physical activity weekly (or as directed by their physician) due to benefits such as increased musculature and improved lab values The importance of intuitive eating specifically learning hunger-satiety cues and understanding the importance of learning a new body: The importance of mindful eating to avoid grazing behaviors   Goals Increase physical activity; aim for something structured; check out the BELT program  Handouts Provided Include  Standard Prep Plan advancement guide  Learning Style & Readiness for Change Teaching method  utilized: Special Educational Needs Teacher  Demonstrated degree of understanding via: Teach Back  Readiness Level: ready Barriers to learning/adherence to lifestyle change: nothing identified  RD's Notes for Next Visit Assess adherence to pt chosen goals  MONITORING & EVALUATION Dietary intake, weekly physical activity, body  weight.  Next Steps Patient is to follow-up in 3 months for 6 month post-op follow-up.

## 2024-05-14 ENCOUNTER — Other Ambulatory Visit (HOSPITAL_COMMUNITY): Payer: Self-pay | Admitting: Cardiology

## 2024-05-14 NOTE — Telephone Encounter (Signed)
 Pt last saw Alexander Beauvais, NP on 04/11/24, last labs 04/08/24 Creat 0.77, age 49, weight 110.9kg, CrCl 186.03, based on CrCl pt is on appropriate dosage of Xarelto  20mg  every day for afib.  Will refill rx.

## 2024-05-23 ENCOUNTER — Ambulatory Visit: Payer: Self-pay | Admitting: General Practice

## 2024-05-23 ENCOUNTER — Ambulatory Visit (HOSPITAL_COMMUNITY)
Admission: RE | Admit: 2024-05-23 | Discharge: 2024-05-23 | Disposition: A | Discharge status: Home / Self Care | Source: Ambulatory Visit | Attending: Internal Medicine | Admitting: Internal Medicine

## 2024-05-23 DIAGNOSIS — I5022 Chronic systolic (congestive) heart failure: Secondary | ICD-10-CM

## 2024-05-23 LAB — ECHOCARDIOGRAM COMPLETE
Area-P 1/2: 3.68 cm2
S' Lateral: 3.9 cm

## 2024-06-10 NOTE — Addendum Note (Signed)
 Addended by: SEBASTIAN, TEE Y on: 06/10/2024 12:47 PM   Modules accepted: Orders

## 2024-07-08 ENCOUNTER — Encounter: Admitting: Dietician
